# Patient Record
Sex: Male | Born: 1955 | Race: White | Hispanic: No | Marital: Married | Smoking: Never smoker
Health system: Southern US, Community
[De-identification: ages and names within clinical notes are randomized; demographics above are authoritative.]

## PROBLEM LIST (undated history)

## (undated) DIAGNOSIS — I10 Essential (primary) hypertension: Secondary | ICD-10-CM

---

## 2002-04-15 ENCOUNTER — Emergency Department (HOSPITAL_COMMUNITY): Admission: EM | Admit: 2002-04-15 | Discharge: 2002-04-15 | Payer: Self-pay | Admitting: Emergency Medicine

## 2002-04-15 ENCOUNTER — Encounter: Payer: Self-pay | Admitting: Emergency Medicine

## 2004-09-01 ENCOUNTER — Ambulatory Visit (HOSPITAL_COMMUNITY): Admission: RE | Admit: 2004-09-01 | Discharge: 2004-09-01 | Payer: Self-pay | Admitting: Orthopedic Surgery

## 2004-10-01 ENCOUNTER — Ambulatory Visit (HOSPITAL_BASED_OUTPATIENT_CLINIC_OR_DEPARTMENT_OTHER): Admission: RE | Admit: 2004-10-01 | Discharge: 2004-10-01 | Payer: Self-pay | Admitting: Orthopedic Surgery

## 2005-12-31 IMAGING — CR DG ORBITS FOR FOREIGN BODY
2 series · 2 of 2 positions shown · non-contrast
Comparison: none

CLINICAL DATA: Pre-MRI.
 ORBITS FOR FOREIGN BODY:
 Two views of the orbits reveal no evidence of a metallic foreign body.

[view not recorded (1 of 2)]
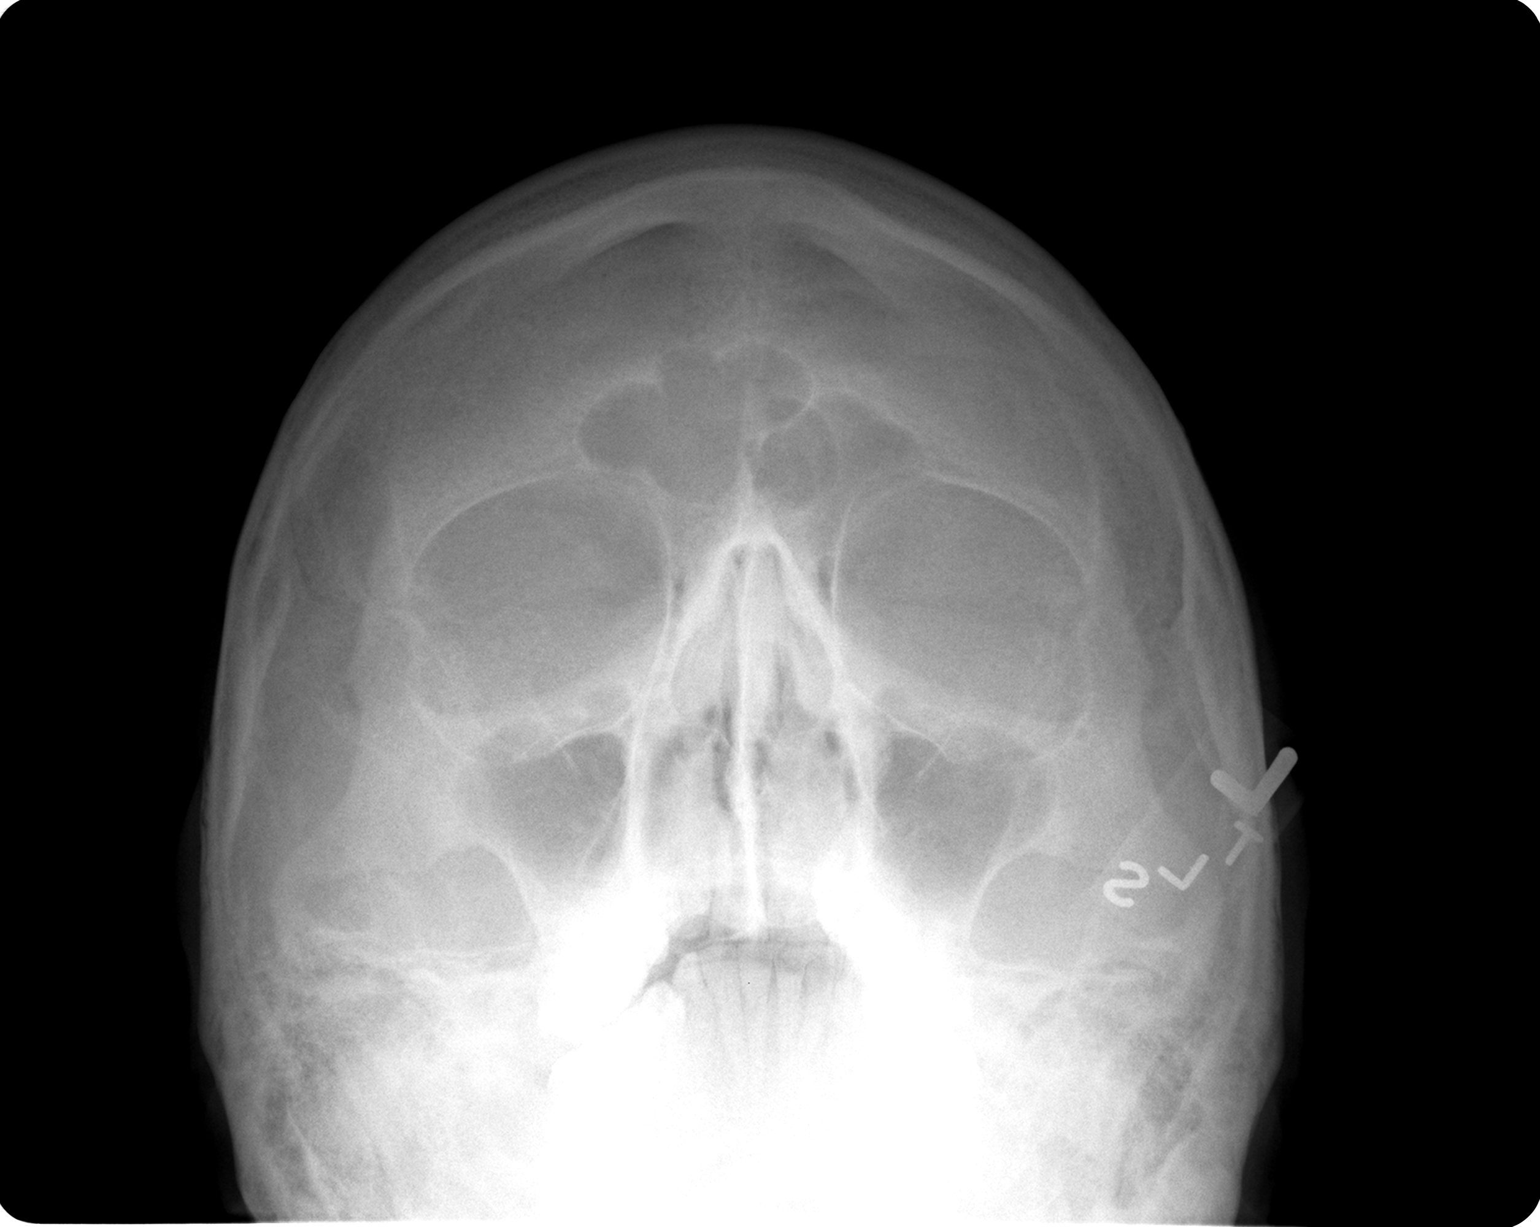

[view not recorded (2 of 2)]
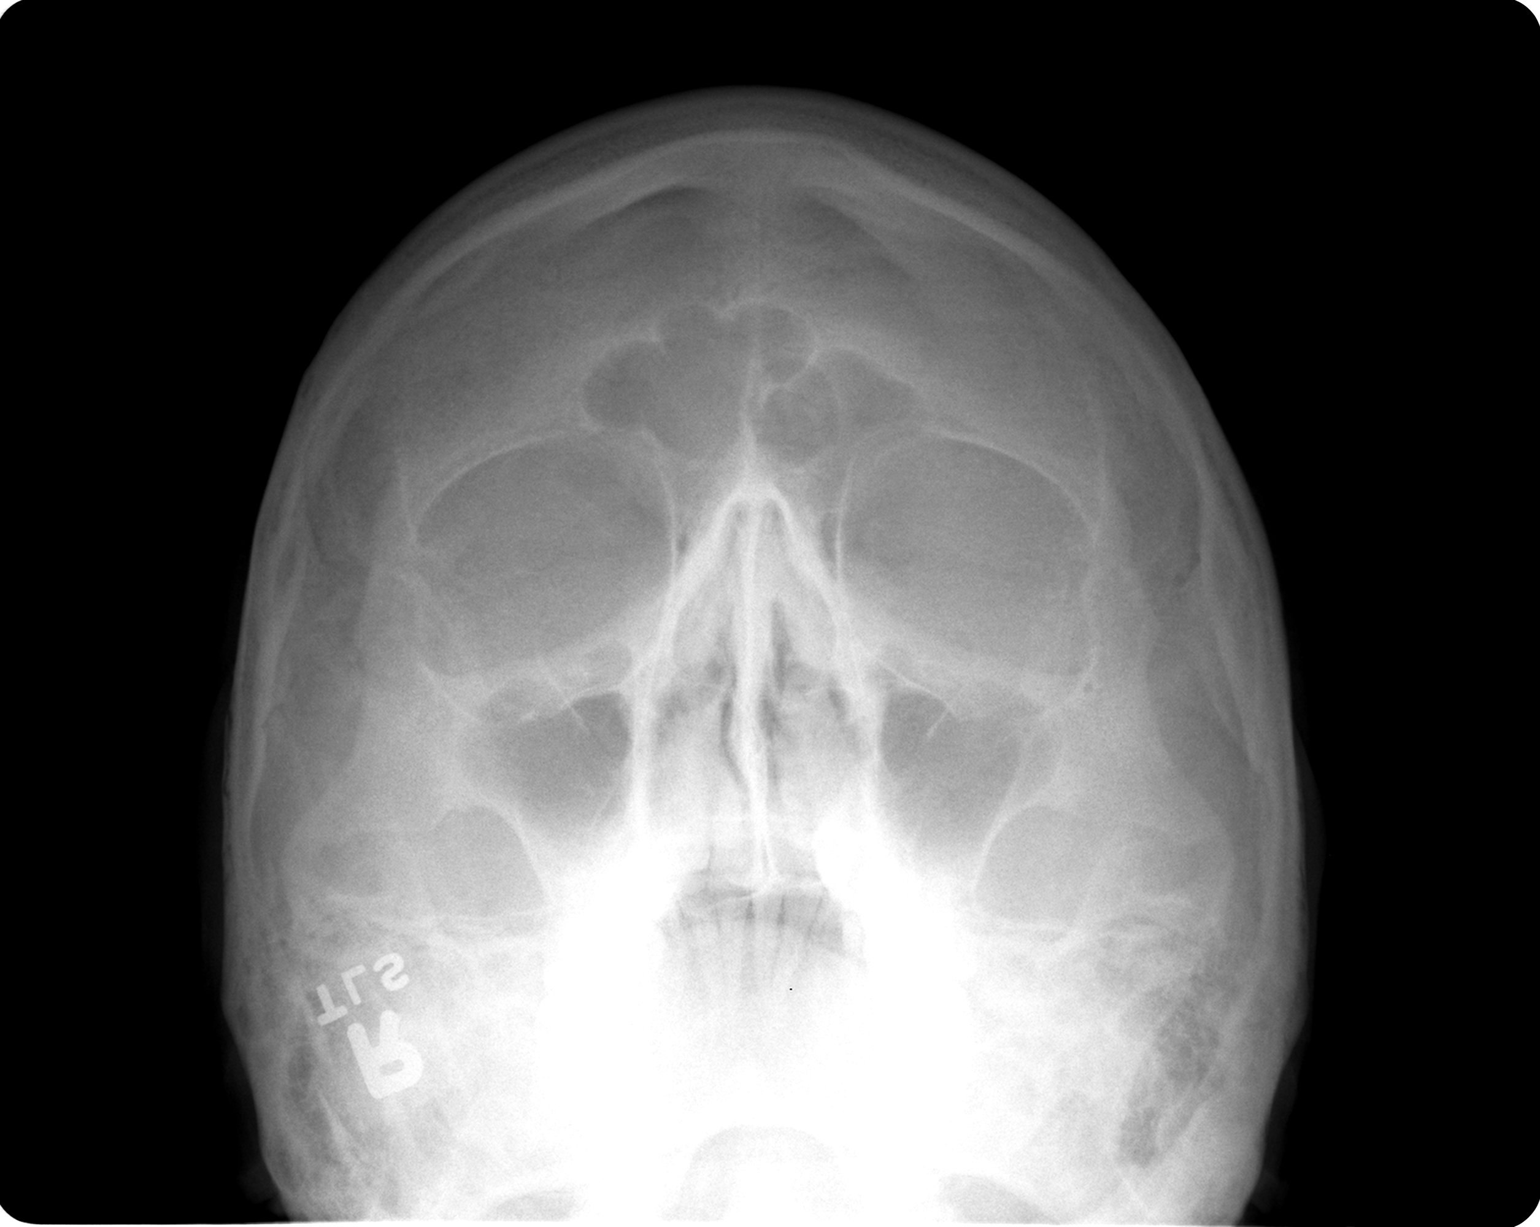

[2 of 2 positions shown; findings below may reference images not displayed]

IMPRESSION: No metallic foreign body.

## 2015-12-19 ENCOUNTER — Encounter (HOSPITAL_COMMUNITY): Payer: Self-pay | Admitting: Emergency Medicine

## 2015-12-19 ENCOUNTER — Emergency Department (HOSPITAL_COMMUNITY): Payer: BLUE CROSS/BLUE SHIELD

## 2015-12-19 ENCOUNTER — Emergency Department (HOSPITAL_COMMUNITY)
Admission: EM | Admit: 2015-12-19 | Discharge: 2015-12-19 | Disposition: A | Discharge status: Home / Self Care | Payer: BLUE CROSS/BLUE SHIELD | Attending: Emergency Medicine | Admitting: Emergency Medicine

## 2015-12-19 DIAGNOSIS — Z7982 Long term (current) use of aspirin: Secondary | ICD-10-CM | POA: Diagnosis not present

## 2015-12-19 DIAGNOSIS — I10 Essential (primary) hypertension: Secondary | ICD-10-CM | POA: Insufficient documentation

## 2015-12-19 DIAGNOSIS — K219 Gastro-esophageal reflux disease without esophagitis: Secondary | ICD-10-CM | POA: Diagnosis not present

## 2015-12-19 DIAGNOSIS — R079 Chest pain, unspecified: Secondary | ICD-10-CM | POA: Diagnosis present

## 2015-12-19 DIAGNOSIS — Z79899 Other long term (current) drug therapy: Secondary | ICD-10-CM | POA: Insufficient documentation

## 2015-12-19 HISTORY — DX: Essential (primary) hypertension: I10

## 2015-12-19 LAB — BASIC METABOLIC PANEL
Anion gap: 11 (ref 5–15)
BUN: 18 mg/dL (ref 6–20)
CO2: 21 mmol/L — ABNORMAL LOW (ref 22–32)
Calcium: 8.8 mg/dL — ABNORMAL LOW (ref 8.9–10.3)
Chloride: 104 mmol/L (ref 101–111)
Creatinine, Ser: 0.98 mg/dL (ref 0.61–1.24)
GFR calc Af Amer: >60 mL/min (ref >60)
GFR calc non Af Amer: >60 mL/min (ref >60)
Glucose, Bld: 122 mg/dL — ABNORMAL HIGH (ref 65–99)
Potassium: 2.9 mmol/L — ABNORMAL LOW (ref 3.5–5.1)
Sodium: 136 mmol/L (ref 135–145)

## 2015-12-19 LAB — HEPATIC FUNCTION PANEL
ALT: 33 U/L (ref 17–63)
AST: 38 U/L (ref 15–41)
Albumin: 4 g/dL (ref 3.5–5.0)
Alkaline Phosphatase: 62 U/L (ref 38–126)
Bilirubin, Direct: 0.1 mg/dL (ref 0.1–0.5)
Indirect Bilirubin: 0.2 mg/dL — ABNORMAL LOW (ref 0.3–0.9)
Total Bilirubin: 0.3 mg/dL (ref 0.3–1.2)
Total Protein: 7.1 g/dL (ref 6.5–8.1)

## 2015-12-19 LAB — CBC
HCT: 43.2 % (ref 39.0–52.0)
Hemoglobin: 14.7 g/dL (ref 13.0–17.0)
MCH: 27.6 pg (ref 26.0–34.0)
MCHC: 34 g/dL (ref 30.0–36.0)
MCV: 81.1 fL (ref 78.0–100.0)
Platelets: 218 10*3/uL (ref 150–400)
RBC: 5.33 MIL/uL (ref 4.22–5.81)
RDW: 13.5 % (ref 11.5–15.5)
WBC: 8.5 10*3/uL (ref 4.0–10.5)

## 2015-12-19 LAB — I-STAT TROPONIN, ED
Troponin i, poc: 0 ng/mL (ref 0.00–0.08)
Troponin i, poc: 0.01 ng/mL (ref 0.00–0.08)

## 2015-12-19 MED ORDER — SODIUM CHLORIDE 0.9 % IV BOLUS (SEPSIS)
1000.0000 mL | Freq: Once | INTRAVENOUS | Status: AC
  Administered 2015-12-19: 1000 mL via INTRAVENOUS

## 2015-12-19 MED ORDER — PANTOPRAZOLE SODIUM 40 MG IV SOLR
40.0000 mg | Freq: Once | INTRAVENOUS | Status: AC
Start: 2015-12-19 — End: 2015-12-19
  Administered 2015-12-19: 40 mg via INTRAVENOUS
  Filled 2015-12-19: qty 40

## 2015-12-19 MED ORDER — POTASSIUM CHLORIDE CRYS ER 20 MEQ PO TBCR
40.0000 meq | EXTENDED_RELEASE_TABLET | Freq: Once | ORAL | Status: AC
  Administered 2015-12-19: 40 meq via ORAL
  Filled 2015-12-19: qty 2

## 2015-12-19 MED ORDER — PANTOPRAZOLE SODIUM 20 MG PO TBEC: 20.0000 mg | DELAYED_RELEASE_TABLET | Freq: Every day | ORAL | Status: DC

## 2015-12-19 NOTE — ED Provider Notes (Signed)
CSN: [id number]     Arrival date & time 12/19/15  0706 History   First MD Initiated Contact with Patient 12/19/15 [phone]     Chief Complaint  Patient presents with  . Chest Pain  . Diarrhea     (Consider location/radiation/quality/duration/timing/severity/associated sxs/prior Treatment) Patient is a 60 y.o. male presenting with chest pain and diarrhea. The history is provided by the patient (Patient states that he has had diarrhea for a few days. No blood in the stool today started having some chest discomfort).  Chest Pain Pain location:  Epigastric Pain quality: aching   Pain radiates to:  Does not radiate Pain radiates to the back: no   Pain severity:  Moderate Timing:  Constant Chronicity:  New Context: not breathing   Associated symptoms: no abdominal pain, no back pain, no cough, no fatigue and no headache   Diarrhea Associated symptoms: no abdominal pain and no headaches     Past Medical History  Diagnosis Date  . Hypertension    History reviewed. No pertinent past surgical history. No family history on file. Social History  Substance Use Topics  . Smoking status: None  . Smokeless tobacco: None  . Alcohol Use: None    Review of Systems  Constitutional: Negative for appetite change and fatigue.  HENT: Negative for congestion, ear discharge and sinus pressure.   Eyes: Negative for discharge.  Respiratory: Negative for cough.   Cardiovascular: Positive for chest pain.  Gastrointestinal: Positive for diarrhea. Negative for abdominal pain.  Genitourinary: Negative for frequency and hematuria.  Musculoskeletal: Negative for back pain.  Skin: Negative for rash.  Neurological: Negative for seizures and headaches.  Psychiatric/Behavioral: Negative for hallucinations.      Allergies  Bee venom  Home Medications   Prior to Admission medications   Medication Sig Start Date End Date Taking? Authorizing Provider  acetaminophen (TYLENOL) 500 MG tablet Take 1,000 mg  by mouth every 6 (six) hours as needed for mild pain, moderate pain, fever or headache.   Yes Historical Provider, MD  aspirin EC 81 MG tablet Take 81 mg by mouth daily.   Yes Historical Provider, MD  bismuth subsalicylate (PEPTO BISMOL) 262 MG/15ML suspension Take 30 mLs by mouth every 6 (six) hours as needed for indigestion or diarrhea or loose stools.   Yes Historical Provider, MD  chlorthalidone (HYGROTON) 25 MG tablet Take 25 mg by mouth daily.   Yes Historical Provider, MD  glucosamine-chondroitin 500-400 MG tablet Take 1 tablet by mouth daily.   Yes Historical Provider, MD  losartan (COZAAR) 50 MG tablet Take 50 mg by mouth daily.   Yes Historical Provider, MD  metoprolol succinate (TOPROL-XL) 100 MG 24 hr tablet Take 100 mg by mouth daily. Take with or immediately following a meal.   Yes Historical Provider, MD  potassium chloride (K-DUR,KLOR-CON) 10 MEQ tablet Take 10 mEq by mouth daily.   Yes Historical Provider, MD   BP 125/90 mmHg  Pulse 88  Resp 16  SpO2 95% Physical Exam  Constitutional: He is oriented to person, place, and time. He appears well-developed.  HENT:  Head: Normocephalic.  Eyes: Conjunctivae and EOM are normal. No scleral icterus.  Neck: Neck supple. No thyromegaly present.  Cardiovascular: Normal rate and regular rhythm.  Exam reveals no gallop and no friction rub.   No murmur heard. Pulmonary/Chest: No stridor. He has no wheezes. He has no rales. He exhibits no tenderness.  Abdominal: He exhibits no distension. There is no tenderness. There is no rebound.  Musculoskeletal: Normal range of motion. He exhibits no edema.  Lymphadenopathy:    He has no cervical adenopathy.  Neurological: He is oriented to person, place, and time. He exhibits normal muscle tone. Coordination normal.  Skin: No rash noted. No erythema.  Psychiatric: He has a normal mood and affect. His behavior is normal.    ED Course  Procedures (including critical care time) Labs Review Labs  Reviewed  BASIC METABOLIC PANEL - Abnormal; Notable for the following:    Potassium 2.9 (*)    CO2 21 (*)    Glucose, Bld 122 (*)    Calcium 8.8 (*)    All other components within normal limits  HEPATIC FUNCTION PANEL - Abnormal; Notable for the following:    Indirect Bilirubin 0.2 (*)    All other components within normal limits  CBC  I-STAT TROPOININ, ED    Imaging Review Dg Chest 2 View  12/19/2015  CLINICAL DATA:  Muscular-type chest pain for the last 2-3 days, with new onset squeezing type chest pain and shortness of breath this morning. EXAM: CHEST  2 VIEW COMPARISON:  None. FINDINGS: Cardiomediastinal silhouette is normal in size and configuration. Lungs are clear. Lung volumes are normal. No evidence of pneumonia. No pleural effusion. No pneumothorax. Degenerative spurring is seen throughout the thoracic spine, mild to moderate in degree. No acute osseous abnormality. Soft tissues about the chest are unremarkable. IMPRESSION: Lungs are clear and there is no evidence of acute cardiopulmonary abnormality. Heart size is normal. Electronically Signed   By: Bary Richard M.D.   On: 12/19/2015 07:45   I have personally reviewed and evaluated these images and lab results as part of my medical decision-making.   EKG Interpretation   Date/Time:  Thursday December 19 2015 07:18:13 EST Ventricular Rate:  85 PR Interval:  160 QRS Duration: 110 QT Interval:  376 QTC Calculation: 447 R Axis:     Text Interpretation:  Sinus rhythm Low voltage, precordial leads RSR' in  V1 or V2, right VCD or RVH Borderline T abnormalities, diffuse leads  Baseline wander in lead(s) V3 Confirmed by Jolaine Fryberger  MD, Shyrl Obi [phone]) on  12/19/2015 2:12:38 PM      MDM   Final diagnoses:  None    Patient presented with diarrhea and some epigastric discomfort patient has had 2 troponins are negative and EKG is unremarkable.this cardiology is a patient improved with protonic IV will give patient a protonic  prescription and have him follow-up with his PCP this week    Bethann Berkshire, MD 12/19/15 1413

## 2015-12-19 NOTE — Discharge Instructions (Signed)
Follow up with your md next week. °

## 2015-12-19 NOTE — ED Notes (Signed)
MD at bedside. 

## 2015-12-19 NOTE — ED Notes (Signed)
Patient transported to X-ray 

## 2015-12-19 NOTE — ED Notes (Signed)
Pt c/o diarrhea onset Monday, nausea, emesis, indigestion with copious amounts of burping x several days with muscular type chest pain, SOB, squeezing chest pain onset this morning. Denies any aggravating/alleviating factors. No blood in stool, no history of arrhythmia.

## 2016-08-03 DIAGNOSIS — R42 Dizziness and giddiness: Secondary | ICD-10-CM | POA: Diagnosis not present

## 2016-08-03 DIAGNOSIS — I10 Essential (primary) hypertension: Secondary | ICD-10-CM | POA: Diagnosis not present

## 2016-08-03 DIAGNOSIS — Z1389 Encounter for screening for other disorder: Secondary | ICD-10-CM | POA: Diagnosis not present

## 2016-08-03 DIAGNOSIS — Z23 Encounter for immunization: Secondary | ICD-10-CM | POA: Diagnosis not present

## 2016-08-03 DIAGNOSIS — Z0000 Encounter for general adult medical examination without abnormal findings: Secondary | ICD-10-CM | POA: Diagnosis not present

## 2016-10-09 DIAGNOSIS — J101 Influenza due to other identified influenza virus with other respiratory manifestations: Secondary | ICD-10-CM | POA: Diagnosis not present

## 2016-10-09 DIAGNOSIS — Z6838 Body mass index (BMI) 38.0-38.9, adult: Secondary | ICD-10-CM | POA: Diagnosis not present

## 2016-10-09 DIAGNOSIS — E669 Obesity, unspecified: Secondary | ICD-10-CM | POA: Diagnosis not present

## 2017-02-01 DIAGNOSIS — I10 Essential (primary) hypertension: Secondary | ICD-10-CM | POA: Diagnosis not present

## 2017-02-01 DIAGNOSIS — Z6837 Body mass index (BMI) 37.0-37.9, adult: Secondary | ICD-10-CM | POA: Diagnosis not present

## 2017-08-09 DIAGNOSIS — Z0000 Encounter for general adult medical examination without abnormal findings: Secondary | ICD-10-CM | POA: Diagnosis not present

## 2017-08-09 DIAGNOSIS — I10 Essential (primary) hypertension: Secondary | ICD-10-CM | POA: Diagnosis not present

## 2017-08-09 DIAGNOSIS — Z6837 Body mass index (BMI) 37.0-37.9, adult: Secondary | ICD-10-CM | POA: Diagnosis not present

## 2018-02-09 DIAGNOSIS — G4719 Other hypersomnia: Secondary | ICD-10-CM | POA: Diagnosis not present

## 2018-02-09 DIAGNOSIS — Z6838 Body mass index (BMI) 38.0-38.9, adult: Secondary | ICD-10-CM | POA: Diagnosis not present

## 2018-02-09 DIAGNOSIS — Z1331 Encounter for screening for depression: Secondary | ICD-10-CM | POA: Diagnosis not present

## 2018-02-09 DIAGNOSIS — I10 Essential (primary) hypertension: Secondary | ICD-10-CM | POA: Diagnosis not present

## 2018-02-23 DIAGNOSIS — D649 Anemia, unspecified: Secondary | ICD-10-CM | POA: Diagnosis not present

## 2018-03-15 DIAGNOSIS — Z1211 Encounter for screening for malignant neoplasm of colon: Secondary | ICD-10-CM | POA: Diagnosis not present

## 2018-04-04 DIAGNOSIS — D509 Iron deficiency anemia, unspecified: Secondary | ICD-10-CM | POA: Diagnosis not present

## 2018-04-04 DIAGNOSIS — Z6837 Body mass index (BMI) 37.0-37.9, adult: Secondary | ICD-10-CM | POA: Diagnosis not present

## 2018-06-08 DIAGNOSIS — D509 Iron deficiency anemia, unspecified: Secondary | ICD-10-CM | POA: Diagnosis not present

## 2018-09-20 DIAGNOSIS — D509 Iron deficiency anemia, unspecified: Secondary | ICD-10-CM | POA: Diagnosis not present

## 2018-09-20 DIAGNOSIS — Z6836 Body mass index (BMI) 36.0-36.9, adult: Secondary | ICD-10-CM | POA: Diagnosis not present

## 2018-09-20 DIAGNOSIS — I10 Essential (primary) hypertension: Secondary | ICD-10-CM | POA: Diagnosis not present

## 2018-09-20 DIAGNOSIS — Z0000 Encounter for general adult medical examination without abnormal findings: Secondary | ICD-10-CM | POA: Diagnosis not present

## 2019-04-05 DIAGNOSIS — I10 Essential (primary) hypertension: Secondary | ICD-10-CM | POA: Diagnosis not present

## 2019-04-05 DIAGNOSIS — Z1331 Encounter for screening for depression: Secondary | ICD-10-CM | POA: Diagnosis not present

## 2019-10-06 DIAGNOSIS — I10 Essential (primary) hypertension: Secondary | ICD-10-CM | POA: Diagnosis not present

## 2019-10-06 DIAGNOSIS — Z0000 Encounter for general adult medical examination without abnormal findings: Secondary | ICD-10-CM | POA: Diagnosis not present

## 2019-10-06 DIAGNOSIS — L821 Other seborrheic keratosis: Secondary | ICD-10-CM | POA: Diagnosis not present

## 2019-10-06 DIAGNOSIS — Z6839 Body mass index (BMI) 39.0-39.9, adult: Secondary | ICD-10-CM | POA: Diagnosis not present

## 2019-10-06 DIAGNOSIS — Z13220 Encounter for screening for lipoid disorders: Secondary | ICD-10-CM | POA: Diagnosis not present

## 2020-04-08 DIAGNOSIS — I10 Essential (primary) hypertension: Secondary | ICD-10-CM | POA: Diagnosis not present

## 2020-04-08 DIAGNOSIS — Z6839 Body mass index (BMI) 39.0-39.9, adult: Secondary | ICD-10-CM | POA: Diagnosis not present

## 2020-04-08 DIAGNOSIS — Z1331 Encounter for screening for depression: Secondary | ICD-10-CM | POA: Diagnosis not present

## 2024-02-01 ENCOUNTER — Encounter (HOSPITAL_COMMUNITY): Admission: EM | Disposition: E | Payer: Self-pay | Source: Home / Self Care | Attending: Surgery

## 2024-02-01 ENCOUNTER — Inpatient Hospital Stay (HOSPITAL_COMMUNITY): Admitting: Anesthesiology

## 2024-02-01 ENCOUNTER — Inpatient Hospital Stay (HOSPITAL_COMMUNITY)

## 2024-02-01 ENCOUNTER — Encounter (HOSPITAL_COMMUNITY): Payer: Self-pay | Admitting: Cardiology

## 2024-02-01 ENCOUNTER — Other Ambulatory Visit: Payer: Self-pay

## 2024-02-01 ENCOUNTER — Telehealth (HOSPITAL_COMMUNITY): Payer: Self-pay | Admitting: Pharmacy Technician

## 2024-02-01 ENCOUNTER — Other Ambulatory Visit (HOSPITAL_COMMUNITY): Payer: Self-pay

## 2024-02-01 ENCOUNTER — Inpatient Hospital Stay (HOSPITAL_COMMUNITY)
Admission: EM | Admit: 2024-02-01 | Discharge: 2024-03-19 | DRG: 003 | Disposition: E | Attending: Critical Care Medicine | Admitting: Critical Care Medicine

## 2024-02-01 DIAGNOSIS — I6340 Cerebral infarction due to embolism of unspecified cerebral artery: Secondary | ICD-10-CM | POA: Diagnosis not present

## 2024-02-01 DIAGNOSIS — Z515 Encounter for palliative care: Secondary | ICD-10-CM

## 2024-02-01 DIAGNOSIS — R739 Hyperglycemia, unspecified: Secondary | ICD-10-CM | POA: Diagnosis not present

## 2024-02-01 DIAGNOSIS — G4700 Insomnia, unspecified: Secondary | ICD-10-CM | POA: Diagnosis not present

## 2024-02-01 DIAGNOSIS — I2119 ST elevation (STEMI) myocardial infarction involving other coronary artery of inferior wall: Secondary | ICD-10-CM | POA: Diagnosis not present

## 2024-02-01 DIAGNOSIS — I110 Hypertensive heart disease with heart failure: Secondary | ICD-10-CM | POA: Diagnosis not present

## 2024-02-01 DIAGNOSIS — D6959 Other secondary thrombocytopenia: Secondary | ICD-10-CM | POA: Diagnosis not present

## 2024-02-01 DIAGNOSIS — G9349 Other encephalopathy: Secondary | ICD-10-CM | POA: Diagnosis not present

## 2024-02-01 DIAGNOSIS — R569 Unspecified convulsions: Secondary | ICD-10-CM | POA: Diagnosis not present

## 2024-02-01 DIAGNOSIS — I493 Ventricular premature depolarization: Secondary | ICD-10-CM | POA: Diagnosis not present

## 2024-02-01 DIAGNOSIS — E876 Hypokalemia: Secondary | ICD-10-CM | POA: Diagnosis not present

## 2024-02-01 DIAGNOSIS — R4182 Altered mental status, unspecified: Secondary | ICD-10-CM | POA: Diagnosis not present

## 2024-02-01 DIAGNOSIS — J9809 Other diseases of bronchus, not elsewhere classified: Secondary | ICD-10-CM | POA: Diagnosis not present

## 2024-02-01 DIAGNOSIS — E872 Acidosis, unspecified: Secondary | ICD-10-CM | POA: Diagnosis not present

## 2024-02-01 DIAGNOSIS — Y95 Nosocomial condition: Secondary | ICD-10-CM | POA: Diagnosis not present

## 2024-02-01 DIAGNOSIS — I25110 Atherosclerotic heart disease of native coronary artery with unstable angina pectoris: Secondary | ICD-10-CM | POA: Diagnosis not present

## 2024-02-01 DIAGNOSIS — I2111 ST elevation (STEMI) myocardial infarction involving right coronary artery: Principal | ICD-10-CM | POA: Diagnosis present

## 2024-02-01 DIAGNOSIS — G9340 Encephalopathy, unspecified: Secondary | ICD-10-CM

## 2024-02-01 DIAGNOSIS — I10 Essential (primary) hypertension: Secondary | ICD-10-CM

## 2024-02-01 DIAGNOSIS — B371 Pulmonary candidiasis: Secondary | ICD-10-CM | POA: Diagnosis not present

## 2024-02-01 DIAGNOSIS — Z87891 Personal history of nicotine dependence: Secondary | ICD-10-CM

## 2024-02-01 DIAGNOSIS — N170 Acute kidney failure with tubular necrosis: Secondary | ICD-10-CM | POA: Diagnosis not present

## 2024-02-01 DIAGNOSIS — I4891 Unspecified atrial fibrillation: Secondary | ICD-10-CM | POA: Diagnosis not present

## 2024-02-01 DIAGNOSIS — K567 Ileus, unspecified: Secondary | ICD-10-CM | POA: Diagnosis not present

## 2024-02-01 DIAGNOSIS — I7121 Aneurysm of the ascending aorta, without rupture: Secondary | ICD-10-CM

## 2024-02-01 DIAGNOSIS — I351 Nonrheumatic aortic (valve) insufficiency: Secondary | ICD-10-CM | POA: Diagnosis present

## 2024-02-01 DIAGNOSIS — I7102 Dissection of abdominal aorta: Secondary | ICD-10-CM | POA: Diagnosis not present

## 2024-02-01 DIAGNOSIS — Z79899 Other long term (current) drug therapy: Secondary | ICD-10-CM

## 2024-02-01 DIAGNOSIS — I7101 Dissection of ascending aorta: Secondary | ICD-10-CM | POA: Diagnosis not present

## 2024-02-01 DIAGNOSIS — J189 Pneumonia, unspecified organism: Secondary | ICD-10-CM | POA: Diagnosis not present

## 2024-02-01 DIAGNOSIS — E785 Hyperlipidemia, unspecified: Secondary | ICD-10-CM | POA: Diagnosis present

## 2024-02-01 DIAGNOSIS — Z6837 Body mass index (BMI) 37.0-37.9, adult: Secondary | ICD-10-CM

## 2024-02-01 DIAGNOSIS — A419 Sepsis, unspecified organism: Secondary | ICD-10-CM | POA: Diagnosis not present

## 2024-02-01 DIAGNOSIS — I82612 Acute embolism and thrombosis of superficial veins of left upper extremity: Secondary | ICD-10-CM | POA: Diagnosis not present

## 2024-02-01 DIAGNOSIS — E870 Hyperosmolality and hypernatremia: Secondary | ICD-10-CM | POA: Diagnosis not present

## 2024-02-01 DIAGNOSIS — I252 Old myocardial infarction: Secondary | ICD-10-CM

## 2024-02-01 DIAGNOSIS — N990 Postprocedural (acute) (chronic) kidney failure: Secondary | ICD-10-CM | POA: Diagnosis not present

## 2024-02-01 DIAGNOSIS — E8809 Other disorders of plasma-protein metabolism, not elsewhere classified: Secondary | ICD-10-CM | POA: Diagnosis not present

## 2024-02-01 DIAGNOSIS — G931 Anoxic brain damage, not elsewhere classified: Secondary | ICD-10-CM | POA: Diagnosis not present

## 2024-02-01 DIAGNOSIS — Z930 Tracheostomy status: Secondary | ICD-10-CM

## 2024-02-01 DIAGNOSIS — L89893 Pressure ulcer of other site, stage 3: Secondary | ICD-10-CM | POA: Diagnosis not present

## 2024-02-01 DIAGNOSIS — I480 Paroxysmal atrial fibrillation: Secondary | ICD-10-CM | POA: Diagnosis not present

## 2024-02-01 DIAGNOSIS — R570 Cardiogenic shock: Secondary | ICD-10-CM | POA: Diagnosis not present

## 2024-02-01 DIAGNOSIS — Z9911 Dependence on respirator [ventilator] status: Secondary | ICD-10-CM

## 2024-02-01 DIAGNOSIS — K5900 Constipation, unspecified: Secondary | ICD-10-CM | POA: Diagnosis not present

## 2024-02-01 DIAGNOSIS — E878 Other disorders of electrolyte and fluid balance, not elsewhere classified: Secondary | ICD-10-CM | POA: Diagnosis not present

## 2024-02-01 DIAGNOSIS — E871 Hypo-osmolality and hyponatremia: Secondary | ICD-10-CM | POA: Diagnosis not present

## 2024-02-01 DIAGNOSIS — N179 Acute kidney failure, unspecified: Secondary | ICD-10-CM | POA: Diagnosis not present

## 2024-02-01 DIAGNOSIS — Z9889 Other specified postprocedural states: Secondary | ICD-10-CM | POA: Diagnosis not present

## 2024-02-01 DIAGNOSIS — I639 Cerebral infarction, unspecified: Secondary | ICD-10-CM | POA: Diagnosis not present

## 2024-02-01 DIAGNOSIS — D62 Acute posthemorrhagic anemia: Secondary | ICD-10-CM | POA: Diagnosis not present

## 2024-02-01 DIAGNOSIS — K7200 Acute and subacute hepatic failure without coma: Secondary | ICD-10-CM | POA: Diagnosis not present

## 2024-02-01 DIAGNOSIS — D6832 Hemorrhagic disorder due to extrinsic circulating anticoagulants: Secondary | ICD-10-CM | POA: Diagnosis not present

## 2024-02-01 DIAGNOSIS — I5020 Unspecified systolic (congestive) heart failure: Secondary | ICD-10-CM | POA: Diagnosis not present

## 2024-02-01 DIAGNOSIS — I82622 Acute embolism and thrombosis of deep veins of left upper extremity: Secondary | ICD-10-CM | POA: Diagnosis not present

## 2024-02-01 DIAGNOSIS — Z7982 Long term (current) use of aspirin: Secondary | ICD-10-CM

## 2024-02-01 DIAGNOSIS — J9602 Acute respiratory failure with hypercapnia: Secondary | ICD-10-CM | POA: Diagnosis not present

## 2024-02-01 DIAGNOSIS — Z66 Do not resuscitate: Secondary | ICD-10-CM | POA: Diagnosis not present

## 2024-02-01 DIAGNOSIS — I5021 Acute systolic (congestive) heart failure: Secondary | ICD-10-CM | POA: Diagnosis not present

## 2024-02-01 DIAGNOSIS — E669 Obesity, unspecified: Secondary | ICD-10-CM | POA: Diagnosis present

## 2024-02-01 DIAGNOSIS — I2510 Atherosclerotic heart disease of native coronary artery without angina pectoris: Secondary | ICD-10-CM | POA: Diagnosis not present

## 2024-02-01 DIAGNOSIS — J810 Acute pulmonary edema: Secondary | ICD-10-CM

## 2024-02-01 DIAGNOSIS — T502X5A Adverse effect of carbonic-anhydrase inhibitors, benzothiadiazides and other diuretics, initial encounter: Secondary | ICD-10-CM | POA: Diagnosis not present

## 2024-02-01 DIAGNOSIS — Z95828 Presence of other vascular implants and grafts: Secondary | ICD-10-CM | POA: Diagnosis not present

## 2024-02-01 DIAGNOSIS — R609 Edema, unspecified: Secondary | ICD-10-CM | POA: Diagnosis not present

## 2024-02-01 DIAGNOSIS — J9601 Acute respiratory failure with hypoxia: Secondary | ICD-10-CM | POA: Diagnosis not present

## 2024-02-01 DIAGNOSIS — R6521 Severe sepsis with septic shock: Secondary | ICD-10-CM | POA: Diagnosis not present

## 2024-02-01 DIAGNOSIS — R079 Chest pain, unspecified: Secondary | ICD-10-CM

## 2024-02-01 DIAGNOSIS — D72829 Elevated white blood cell count, unspecified: Secondary | ICD-10-CM | POA: Diagnosis not present

## 2024-02-01 DIAGNOSIS — Z7189 Other specified counseling: Secondary | ICD-10-CM | POA: Diagnosis not present

## 2024-02-01 DIAGNOSIS — T8119XA Other postprocedural shock, initial encounter: Secondary | ICD-10-CM | POA: Diagnosis not present

## 2024-02-01 DIAGNOSIS — Z781 Physical restraint status: Secondary | ICD-10-CM

## 2024-02-01 DIAGNOSIS — L89151 Pressure ulcer of sacral region, stage 1: Secondary | ICD-10-CM | POA: Diagnosis not present

## 2024-02-01 DIAGNOSIS — Z7902 Long term (current) use of antithrombotics/antiplatelets: Secondary | ICD-10-CM

## 2024-02-01 DIAGNOSIS — R4701 Aphasia: Secondary | ICD-10-CM | POA: Diagnosis not present

## 2024-02-01 DIAGNOSIS — R29719 NIHSS score 19: Secondary | ICD-10-CM | POA: Diagnosis not present

## 2024-02-01 DIAGNOSIS — I82462 Acute embolism and thrombosis of left calf muscular vein: Secondary | ICD-10-CM | POA: Diagnosis not present

## 2024-02-01 DIAGNOSIS — K9189 Other postprocedural complications and disorders of digestive system: Secondary | ICD-10-CM | POA: Diagnosis not present

## 2024-02-01 DIAGNOSIS — T45525A Adverse effect of antithrombotic drugs, initial encounter: Secondary | ICD-10-CM | POA: Diagnosis not present

## 2024-02-01 DIAGNOSIS — D75839 Thrombocytosis, unspecified: Secondary | ICD-10-CM | POA: Diagnosis not present

## 2024-02-01 DIAGNOSIS — F05 Delirium due to known physiological condition: Secondary | ICD-10-CM | POA: Diagnosis not present

## 2024-02-01 HISTORY — PX: THORACIC AORTIC ANEURYSM REPAIR: SHX799

## 2024-02-01 HISTORY — PX: INTRAOPERATIVE TRANSESOPHAGEAL ECHOCARDIOGRAM: SHX5062

## 2024-02-01 HISTORY — PX: CORONARY/GRAFT ACUTE MI REVASCULARIZATION: CATH118305

## 2024-02-01 HISTORY — PX: BENTALL PROCEDURE: SHX5058

## 2024-02-01 LAB — PREPARE FRESH FROZEN PLASMA: Unit division: 0

## 2024-02-01 LAB — POCT I-STAT, CHEM 8
BUN: 18 mg/dL (ref 8–23)
BUN: 25 mg/dL — ABNORMAL HIGH (ref 8–23)
BUN: 22 mg/dL (ref 8–23)
BUN: 23 mg/dL (ref 8–23)
BUN: 24 mg/dL — ABNORMAL HIGH (ref 8–23)
BUN: 23 mg/dL (ref 8–23)
BUN: 23 mg/dL (ref 8–23)
Calcium, Ion: 1.16 mmol/L (ref 1.15–1.40)
Calcium, Ion: 1.18 mmol/L (ref 1.15–1.40)
Calcium, Ion: 1.13 mmol/L — ABNORMAL LOW (ref 1.15–1.40)
Calcium, Ion: 0.99 mmol/L — ABNORMAL LOW (ref 1.15–1.40)
Calcium, Ion: 0.97 mmol/L — ABNORMAL LOW (ref 1.15–1.40)
Calcium, Ion: 0.86 mmol/L — CL (ref 1.15–1.40)
Calcium, Ion: 0.89 mmol/L — CL (ref 1.15–1.40)
Chloride: 108 mmol/L (ref 98–111)
Chloride: 106 mmol/L (ref 98–111)
Chloride: 106 mmol/L (ref 98–111)
Chloride: 103 mmol/L (ref 98–111)
Chloride: 100 mmol/L (ref 98–111)
Chloride: 101 mmol/L (ref 98–111)
Chloride: 102 mmol/L (ref 98–111)
Creatinine, Ser: 1.3 mg/dL — ABNORMAL HIGH (ref 0.61–1.24)
Creatinine, Ser: 1.6 mg/dL — ABNORMAL HIGH (ref 0.61–1.24)
Creatinine, Ser: 1.7 mg/dL — ABNORMAL HIGH (ref 0.61–1.24)
Creatinine, Ser: 1.5 mg/dL — ABNORMAL HIGH (ref 0.61–1.24)
Creatinine, Ser: 1.6 mg/dL — ABNORMAL HIGH (ref 0.61–1.24)
Creatinine, Ser: 1.5 mg/dL — ABNORMAL HIGH (ref 0.61–1.24)
Creatinine, Ser: 1.6 mg/dL — ABNORMAL HIGH (ref 0.61–1.24)
Glucose, Bld: 165 mg/dL — ABNORMAL HIGH (ref 70–99)
Glucose, Bld: 128 mg/dL — ABNORMAL HIGH (ref 70–99)
Glucose, Bld: 127 mg/dL — ABNORMAL HIGH (ref 70–99)
Glucose, Bld: 126 mg/dL — ABNORMAL HIGH (ref 70–99)
Glucose, Bld: 195 mg/dL — ABNORMAL HIGH (ref 70–99)
Glucose, Bld: 179 mg/dL — ABNORMAL HIGH (ref 70–99)
Glucose, Bld: 202 mg/dL — ABNORMAL HIGH (ref 70–99)
HCT: 44 % (ref 39.0–52.0)
HCT: 45 % (ref 39.0–52.0)
HCT: 38 % — ABNORMAL LOW (ref 39.0–52.0)
HCT: 30 % — ABNORMAL LOW (ref 39.0–52.0)
HCT: 27 % — ABNORMAL LOW (ref 39.0–52.0)
HCT: 23 % — ABNORMAL LOW (ref 39.0–52.0)
HCT: 26 % — ABNORMAL LOW (ref 39.0–52.0)
Hemoglobin: 15 g/dL (ref 13.0–17.0)
Hemoglobin: 15.3 g/dL (ref 13.0–17.0)
Hemoglobin: 12.9 g/dL — ABNORMAL LOW (ref 13.0–17.0)
Hemoglobin: 10.2 g/dL — ABNORMAL LOW (ref 13.0–17.0)
Hemoglobin: 9.2 g/dL — ABNORMAL LOW (ref 13.0–17.0)
Hemoglobin: 7.8 g/dL — ABNORMAL LOW (ref 13.0–17.0)
Hemoglobin: 8.8 g/dL — ABNORMAL LOW (ref 13.0–17.0)
Potassium: 3.3 mmol/L — ABNORMAL LOW (ref 3.5–5.1)
Potassium: 5 mmol/L (ref 3.5–5.1)
Potassium: 5.3 mmol/L — ABNORMAL HIGH (ref 3.5–5.1)
Potassium: 5 mmol/L (ref 3.5–5.1)
Potassium: 4 mmol/L (ref 3.5–5.1)
Potassium: 4.1 mmol/L (ref 3.5–5.1)
Potassium: 4.5 mmol/L (ref 3.5–5.1)
Sodium: 143 mmol/L (ref 135–145)
Sodium: 140 mmol/L (ref 135–145)
Sodium: 138 mmol/L (ref 135–145)
Sodium: 139 mmol/L (ref 135–145)
Sodium: 139 mmol/L (ref 135–145)
Sodium: 142 mmol/L (ref 135–145)
Sodium: 143 mmol/L (ref 135–145)
TCO2: 21 mmol/L — ABNORMAL LOW (ref 22–32)
TCO2: 22 mmol/L (ref 22–32)
TCO2: 21 mmol/L — ABNORMAL LOW (ref 22–32)
TCO2: 26 mmol/L (ref 22–32)
TCO2: 23 mmol/L (ref 22–32)
TCO2: 24 mmol/L (ref 22–32)
TCO2: 25 mmol/L (ref 22–32)

## 2024-02-01 LAB — CBC WITH DIFFERENTIAL/PLATELET
Abs Immature Granulocytes: 0.03 10*3/uL (ref 0.00–0.07)
Basophils Absolute: 0.1 10*3/uL (ref 0.0–0.1)
Basophils Relative: 1 %
Eosinophils Absolute: 0.1 10*3/uL (ref 0.0–0.5)
Eosinophils Relative: 2 %
HCT: 45 % (ref 39.0–52.0)
Hemoglobin: 15.2 g/dL (ref 13.0–17.0)
Immature Granulocytes: 0 %
Lymphocytes Relative: 33 %
Lymphs Abs: 2.4 10*3/uL (ref 0.7–4.0)
MCH: 30 pg (ref 26.0–34.0)
MCHC: 33.8 g/dL (ref 30.0–36.0)
MCV: 88.8 fL (ref 80.0–100.0)
Monocytes Absolute: 0.5 10*3/uL (ref 0.1–1.0)
Monocytes Relative: 6 %
Neutro Abs: 4.2 10*3/uL (ref 1.7–7.7)
Neutrophils Relative %: 58 %
Platelets: 142 10*3/uL — ABNORMAL LOW (ref 150–400)
RBC: 5.07 MIL/uL (ref 4.22–5.81)
RDW: 14.1 % (ref 11.5–15.5)
WBC: 7.2 10*3/uL (ref 4.0–10.5)
nRBC: 0 % (ref 0.0–0.2)

## 2024-02-01 LAB — BPAM FFP
Blood Product Expiration Date: 202504192359
Blood Product Expiration Date: 202504192359
Blood Product Expiration Date: 202504202359
Blood Product Expiration Date: 202504202359
ISSUE DATE / TIME: 202504152027
ISSUE DATE / TIME: 202504152027
ISSUE DATE / TIME: 202504152058
ISSUE DATE / TIME: 202504152058
Unit Type and Rh: 600
Unit Type and Rh: 6200
Unit Type and Rh: 6200
Unit Type and Rh: 6200

## 2024-02-01 LAB — ECHOCARDIOGRAM COMPLETE
Area-P 1/2: 3.26 cm2
Calc EF: 57.5 %
Height: 73 in
S' Lateral: 4 cm
Single Plane A2C EF: 61.4 %
Single Plane A4C EF: 55.1 %
Weight: 4240 [oz_av]

## 2024-02-01 LAB — POCT I-STAT 7, (LYTES, BLD GAS, ICA,H+H)
Acid-Base Excess: 0 mmol/L (ref 0.0–2.0)
Acid-base deficit: 14 mmol/L — ABNORMAL HIGH (ref 0.0–2.0)
Acid-base deficit: 2 mmol/L (ref 0.0–2.0)
Acid-base deficit: 2 mmol/L (ref 0.0–2.0)
Acid-base deficit: 6 mmol/L — ABNORMAL HIGH (ref 0.0–2.0)
Bicarbonate: 15.2 mmol/L — ABNORMAL LOW (ref 20.0–28.0)
Bicarbonate: 24.1 mmol/L (ref 20.0–28.0)
Bicarbonate: 22.4 mmol/L (ref 20.0–28.0)
Bicarbonate: 21.4 mmol/L (ref 20.0–28.0)
Bicarbonate: 24.6 mmol/L (ref 20.0–28.0)
Calcium, Ion: 1 mmol/L — ABNORMAL LOW (ref 1.15–1.40)
Calcium, Ion: 1.19 mmol/L (ref 1.15–1.40)
Calcium, Ion: 1.01 mmol/L — ABNORMAL LOW (ref 1.15–1.40)
Calcium, Ion: 0.88 mmol/L — CL (ref 1.15–1.40)
Calcium, Ion: 0.95 mmol/L — ABNORMAL LOW (ref 1.15–1.40)
HCT: 44 % (ref 39.0–52.0)
HCT: 43 % (ref 39.0–52.0)
HCT: 31 % — ABNORMAL LOW (ref 39.0–52.0)
HCT: 25 % — ABNORMAL LOW (ref 39.0–52.0)
HCT: 26 % — ABNORMAL LOW (ref 39.0–52.0)
Hemoglobin: 15 g/dL (ref 13.0–17.0)
Hemoglobin: 14.6 g/dL (ref 13.0–17.0)
Hemoglobin: 10.5 g/dL — ABNORMAL LOW (ref 13.0–17.0)
Hemoglobin: 8.5 g/dL — ABNORMAL LOW (ref 13.0–17.0)
Hemoglobin: 8.8 g/dL — ABNORMAL LOW (ref 13.0–17.0)
O2 Saturation: 88 %
O2 Saturation: 95 %
O2 Saturation: 100 %
O2 Saturation: 100 %
O2 Saturation: 100 %
Potassium: 2.5 mmol/L — CL (ref 3.5–5.1)
Potassium: 5 mmol/L (ref 3.5–5.1)
Potassium: 5.7 mmol/L — ABNORMAL HIGH (ref 3.5–5.1)
Potassium: 3.8 mmol/L (ref 3.5–5.1)
Potassium: 5.4 mmol/L — ABNORMAL HIGH (ref 3.5–5.1)
Sodium: 109 mmol/L — CL (ref 135–145)
Sodium: 141 mmol/L (ref 135–145)
Sodium: 139 mmol/L (ref 135–145)
Sodium: 136 mmol/L (ref 135–145)
Sodium: 143 mmol/L (ref 135–145)
TCO2: 17 mmol/L — ABNORMAL LOW (ref 22–32)
TCO2: 25 mmol/L (ref 22–32)
TCO2: 24 mmol/L (ref 22–32)
TCO2: 23 mmol/L (ref 22–32)
TCO2: 26 mmol/L (ref 22–32)
pCO2 arterial: 48.8 mmHg — ABNORMAL HIGH (ref 32–48)
pCO2 arterial: 43.4 mmHg (ref 32–48)
pCO2 arterial: 37.7 mmHg (ref 32–48)
pCO2 arterial: 38 mmHg (ref 32–48)
pCO2 arterial: 55.1 mmHg — ABNORMAL HIGH (ref 32–48)
pH, Arterial: 7.101 — CL (ref 7.35–7.45)
pH, Arterial: 7.354 (ref 7.35–7.45)
pH, Arterial: 7.382 (ref 7.35–7.45)
pH, Arterial: 7.197 — CL (ref 7.35–7.45)
pH, Arterial: 7.419 (ref 7.35–7.45)
pO2, Arterial: 74 mmHg — ABNORMAL LOW (ref 83–108)
pO2, Arterial: 81 mmHg — ABNORMAL LOW (ref 83–108)
pO2, Arterial: 348 mmHg — ABNORMAL HIGH (ref 83–108)
pO2, Arterial: 350 mmHg — ABNORMAL HIGH (ref 83–108)
pO2, Arterial: 553 mmHg — ABNORMAL HIGH (ref 83–108)

## 2024-02-01 LAB — LIPID PANEL
Cholesterol: 160 mg/dL (ref 0–200)
HDL: 31 mg/dL — ABNORMAL LOW (ref >40)
LDL Cholesterol: 93 mg/dL (ref 0–99)
Total CHOL/HDL Ratio: 5.2 ratio
Triglycerides: 179 mg/dL — ABNORMAL HIGH (ref <150)
VLDL: 36 mg/dL (ref 0–40)

## 2024-02-01 LAB — POCT I-STAT EG7
Acid-base deficit: 2 mmol/L (ref 0.0–2.0)
Bicarbonate: 24 mmol/L (ref 20.0–28.0)
Calcium, Ion: 1.05 mmol/L — ABNORMAL LOW (ref 1.15–1.40)
HCT: 32 % — ABNORMAL LOW (ref 39.0–52.0)
Hemoglobin: 10.9 g/dL — ABNORMAL LOW (ref 13.0–17.0)
O2 Saturation: 69 %
Potassium: 5.6 mmol/L — ABNORMAL HIGH (ref 3.5–5.1)
Sodium: 140 mmol/L (ref 135–145)
TCO2: 25 mmol/L (ref 22–32)
pCO2, Ven: 43.6 mmHg — ABNORMAL LOW (ref 44–60)
pH, Ven: 7.349 (ref 7.25–7.43)
pO2, Ven: 38 mmHg (ref 32–45)

## 2024-02-01 LAB — TROPONIN I (HIGH SENSITIVITY)
Troponin I (High Sensitivity): 12 ng/L (ref <18)
Troponin I (High Sensitivity): 23506 ng/L (ref <18)
Troponin I (High Sensitivity): >24000 ng/L (ref <18)

## 2024-02-01 LAB — LACTIC ACID, PLASMA
Lactic Acid, Venous: 3.8 mmol/L (ref 0.5–1.9)
Lactic Acid, Venous: 3.6 mmol/L (ref 0.5–1.9)

## 2024-02-01 LAB — PROTIME-INR
INR: 1.3 — ABNORMAL HIGH (ref 0.8–1.2)
Prothrombin Time: 16.2 s — ABNORMAL HIGH (ref 11.4–15.2)

## 2024-02-01 LAB — COMPREHENSIVE METABOLIC PANEL WITH GFR
ALT: 23 U/L (ref 0–44)
ALT: 34 U/L (ref 0–44)
AST: 28 U/L (ref 15–41)
AST: 158 U/L — ABNORMAL HIGH (ref 15–41)
Albumin: 3.6 g/dL (ref 3.5–5.0)
Albumin: 3.6 g/dL (ref 3.5–5.0)
Alkaline Phosphatase: 51 U/L (ref 38–126)
Alkaline Phosphatase: 51 U/L (ref 38–126)
Anion gap: 14 (ref 5–15)
Anion gap: 10 (ref 5–15)
BUN: 15 mg/dL (ref 8–23)
BUN: 16 mg/dL (ref 8–23)
CO2: 20 mmol/L — ABNORMAL LOW (ref 22–32)
CO2: 26 mmol/L (ref 22–32)
Calcium: 9.2 mg/dL (ref 8.9–10.3)
Calcium: 9.1 mg/dL (ref 8.9–10.3)
Chloride: 109 mmol/L (ref 98–111)
Chloride: 106 mmol/L (ref 98–111)
Creatinine, Ser: 1.3 mg/dL — ABNORMAL HIGH (ref 0.61–1.24)
Creatinine, Ser: 1.45 mg/dL — ABNORMAL HIGH (ref 0.61–1.24)
GFR, Estimated: 60 mL/min — ABNORMAL LOW (ref >60)
GFR, Estimated: 52 mL/min — ABNORMAL LOW (ref >60)
Glucose, Bld: 161 mg/dL — ABNORMAL HIGH (ref 70–99)
Glucose, Bld: 145 mg/dL — ABNORMAL HIGH (ref 70–99)
Potassium: 3.1 mmol/L — ABNORMAL LOW (ref 3.5–5.1)
Potassium: 3.7 mmol/L (ref 3.5–5.1)
Sodium: 143 mmol/L (ref 135–145)
Sodium: 142 mmol/L (ref 135–145)
Total Bilirubin: 0.7 mg/dL (ref 0.0–1.2)
Total Bilirubin: 0.7 mg/dL (ref 0.0–1.2)
Total Protein: 6.5 g/dL (ref 6.5–8.1)
Total Protein: 6.3 g/dL — ABNORMAL LOW (ref 6.5–8.1)

## 2024-02-01 LAB — MAGNESIUM: Magnesium: 1.9 mg/dL (ref 1.7–2.4)

## 2024-02-01 LAB — HEMOGLOBIN A1C
Hgb A1c MFr Bld: 5.4 % (ref 4.8–5.6)
Hgb A1c MFr Bld: 5.3 % (ref 4.8–5.6)
Mean Plasma Glucose: 108.28 mg/dL
Mean Plasma Glucose: 105.41 mg/dL

## 2024-02-01 LAB — ECHO INTRAOPERATIVE TEE
Height: 73 in
Weight: 4240 [oz_av]

## 2024-02-01 LAB — CBC
HCT: 46 % (ref 39.0–52.0)
Hemoglobin: 15.2 g/dL (ref 13.0–17.0)
MCH: 29.7 pg (ref 26.0–34.0)
MCHC: 33 g/dL (ref 30.0–36.0)
MCV: 90 fL (ref 80.0–100.0)
Platelets: 160 10*3/uL (ref 150–400)
RBC: 5.11 MIL/uL (ref 4.22–5.81)
RDW: 14.3 % (ref 11.5–15.5)
WBC: 18.1 10*3/uL — ABNORMAL HIGH (ref 4.0–10.5)
nRBC: 0 % (ref 0.0–0.2)

## 2024-02-01 LAB — POCT ACTIVATED CLOTTING TIME
Activated Clotting Time: 245 s
Activated Clotting Time: 262 s
Activated Clotting Time: 711 s

## 2024-02-01 LAB — HIV ANTIBODY (ROUTINE TESTING W REFLEX): HIV Screen 4th Generation wRfx: NONREACTIVE

## 2024-02-01 LAB — CG4 I-STAT (LACTIC ACID)
Lactic Acid, Venous: 1.7 mmol/L (ref 0.5–1.9)
Lactic Acid, Venous: 4.4 mmol/L (ref 0.5–1.9)

## 2024-02-01 LAB — ABO/RH: ABO/RH(D): B NEG

## 2024-02-01 LAB — APTT: aPTT: 32 s (ref 24–36)

## 2024-02-01 LAB — PREPARE RBC (CROSSMATCH)

## 2024-02-01 LAB — MRSA NEXT GEN BY PCR, NASAL: MRSA by PCR Next Gen: NOT DETECTED

## 2024-02-01 SURGERY — CORONARY/GRAFT ACUTE MI REVASCULARIZATION
Anesthesia: LOCAL

## 2024-02-01 SURGERY — REPAIR, ANEURYSM, AORTA, THORACIC, ASCENDING
Anesthesia: General | Site: Chest

## 2024-02-01 MED ORDER — PLASMA-LYTE A IV SOLN
INTRAVENOUS | Status: DC
  Filled 2024-02-01: qty 2.5

## 2024-02-01 MED ORDER — VASOPRESSIN 20 UNITS/100 ML INFUSION FOR SHOCK
0.0000 [IU]/min | INTRAVENOUS | Status: DC
  Administered 2024-02-02: .03 [IU]/min via INTRAVENOUS
  Filled 2024-02-01 (×2): qty 100

## 2024-02-01 MED ORDER — PHENYLEPHRINE 80 MCG/ML (10ML) SYRINGE FOR IV PUSH (FOR BLOOD PRESSURE SUPPORT)
PREFILLED_SYRINGE | INTRAVENOUS | Status: DC | PRN
  Administered 2024-02-01 (×2): 160 ug via INTRAVENOUS
  Administered 2024-02-01: 240 ug via INTRAVENOUS
  Administered 2024-02-02: 160 ug via INTRAVENOUS

## 2024-02-01 MED ORDER — SODIUM CHLORIDE 0.9 % IV SOLN
INTRAVENOUS | Status: AC | PRN
  Administered 2024-02-01: 250 mL via INTRAVENOUS

## 2024-02-01 MED ORDER — EPHEDRINE SULFATE (PRESSORS) 50 MG/ML IJ SOLN
INTRAMUSCULAR | Status: DC | PRN
  Administered 2024-02-01: 5 mg via INTRAVENOUS

## 2024-02-01 MED ORDER — SODIUM CHLORIDE 0.9 % IV SOLN: INTRAVENOUS | Status: DC | PRN

## 2024-02-01 MED ORDER — CALCIUM CHLORIDE 10 % IV SOLN
INTRAVENOUS | Status: AC
  Filled 2024-02-01: qty 10

## 2024-02-01 MED ORDER — PRASUGREL HCL 10 MG PO TABS: 10.0000 mg | ORAL_TABLET | Freq: Every day | ORAL | Status: DC

## 2024-02-01 MED ORDER — PROPOFOL 10 MG/ML IV BOLUS
0.5000 mg/kg | Freq: Once | INTRAVENOUS | Status: DC
  Filled 2024-02-01: qty 20

## 2024-02-01 MED ORDER — INSULIN REGULAR(HUMAN) IN NACL 100-0.9 UT/100ML-% IV SOLN
INTRAVENOUS | Status: AC
  Administered 2024-02-01: 1 [IU]/h via INTRAVENOUS
  Filled 2024-02-01: qty 100

## 2024-02-01 MED ORDER — ACETAMINOPHEN 325 MG PO TABS: 650.0000 mg | ORAL_TABLET | ORAL | Status: DC | PRN

## 2024-02-01 MED ORDER — NOREPINEPHRINE 4 MG/250ML-% IV SOLN
INTRAVENOUS | Status: AC
  Filled 2024-02-01: qty 250

## 2024-02-01 MED ORDER — SODIUM CHLORIDE 0.9% FLUSH
3.0000 mL | Freq: Two times a day (BID) | INTRAVENOUS | Status: DC
  Administered 2024-02-01 (×2): 3 mL via INTRAVENOUS

## 2024-02-01 MED ORDER — PERFLUTREN LIPID MICROSPHERE
1.0000 mL | INTRAVENOUS | Status: AC | PRN
  Administered 2024-02-01: 2 mL via INTRAVENOUS

## 2024-02-01 MED ORDER — PROPOFOL 10 MG/ML IV BOLUS
INTRAVENOUS | Status: AC
  Filled 2024-02-01: qty 20

## 2024-02-01 MED ORDER — MIDAZOLAM HCL 2 MG/2ML IJ SOLN
INTRAMUSCULAR | Status: AC
  Filled 2024-02-01: qty 2

## 2024-02-01 MED ORDER — SODIUM CHLORIDE 0.9% FLUSH: 3.0000 mL | INTRAVENOUS | Status: DC | PRN

## 2024-02-01 MED ORDER — PROPOFOL 10 MG/ML IV BOLUS
INTRAVENOUS | Status: DC | PRN
  Administered 2024-02-01: 50 mg via INTRAVENOUS
  Administered 2024-02-01: 30 mg via INTRAVENOUS
  Administered 2024-02-01: 50 mg via INTRAVENOUS
  Administered 2024-02-01: 30 mg via INTRAVENOUS
  Administered 2024-02-01: 50 mg via INTRAVENOUS

## 2024-02-01 MED ORDER — MIDAZOLAM HCL 2 MG/2ML IJ SOLN
INTRAMUSCULAR | Status: DC | PRN
  Administered 2024-02-01 (×2): 1 mg via INTRAVENOUS

## 2024-02-01 MED ORDER — MAGNESIUM SULFATE 2 GM/50ML IV SOLN
2.0000 g | Freq: Once | INTRAVENOUS | Status: DC
Start: 2024-02-01 — End: 2024-02-02

## 2024-02-01 MED ORDER — FENTANYL CITRATE (PF) 250 MCG/5ML IJ SOLN
INTRAMUSCULAR | Status: AC
  Filled 2024-02-01: qty 5

## 2024-02-01 MED ORDER — TRANEXAMIC ACID (OHS) PUMP PRIME SOLUTION
2.0000 mg/kg | INTRAVENOUS | Status: DC
  Filled 2024-02-01: qty 2.4

## 2024-02-01 MED ORDER — TRANEXAMIC ACID (OHS) BOLUS VIA INFUSION
15.0000 mg/kg | INTRAVENOUS | Status: AC
  Administered 2024-02-01: 1803 mg via INTRAVENOUS
  Filled 2024-02-01: qty 1803

## 2024-02-01 MED ORDER — VECURONIUM BROMIDE 10 MG IV SOLR
INTRAVENOUS | Status: DC | PRN
  Administered 2024-02-01: 5 mg via INTRAVENOUS
  Administered 2024-02-01: 3 mg via INTRAVENOUS
  Administered 2024-02-02: 4 mg via INTRAVENOUS
  Administered 2024-02-02: 5 mg via INTRAVENOUS
  Administered 2024-02-02: 3 mg via INTRAVENOUS

## 2024-02-01 MED ORDER — FENTANYL CITRATE PF 50 MCG/ML IJ SOSY
50.0000 ug | PREFILLED_SYRINGE | Freq: Once | INTRAMUSCULAR | Status: AC
  Administered 2024-02-01: 50 ug via INTRAVENOUS

## 2024-02-01 MED ORDER — SODIUM CHLORIDE 0.9 % IV SOLN: 250.0000 mL | INTRAVENOUS | Status: DC

## 2024-02-01 MED ORDER — TRANEXAMIC ACID 1000 MG/10ML IV SOLN
1.5000 mg/kg/h | INTRAVENOUS | Status: AC
  Administered 2024-02-01: 1.5 mg/kg/h via INTRAVENOUS
  Filled 2024-02-01: qty 25

## 2024-02-01 MED ORDER — ONDANSETRON HCL 4 MG/2ML IJ SOLN: 4.0000 mg | Freq: Four times a day (QID) | INTRAMUSCULAR | Status: DC | PRN

## 2024-02-01 MED ORDER — MUPIROCIN 2 % EX OINT
1.0000 | TOPICAL_OINTMENT | Freq: Two times a day (BID) | CUTANEOUS | Status: AC
  Administered 2024-02-02 – 2024-02-06 (×8): 1 via NASAL
  Filled 2024-02-01: qty 22

## 2024-02-01 MED ORDER — LACTATED RINGERS IV SOLN: INTRAVENOUS | Status: DC | PRN

## 2024-02-01 MED ORDER — ARTIFICIAL TEARS OPHTHALMIC OINT
TOPICAL_OINTMENT | OPHTHALMIC | Status: DC | PRN
  Administered 2024-02-01: 1 via OPHTHALMIC

## 2024-02-01 MED ORDER — PANTOPRAZOLE SODIUM 20 MG PO TBEC
20.0000 mg | DELAYED_RELEASE_TABLET | Freq: Every day | ORAL | Status: DC
  Filled 2024-02-01 (×2): qty 1

## 2024-02-01 MED ORDER — VERAPAMIL HCL 2.5 MG/ML IV SOLN
INTRAVENOUS | Status: AC
Start: 2024-02-01 — End: ?
  Filled 2024-02-01: qty 2

## 2024-02-01 MED ORDER — VECURONIUM BROMIDE 10 MG IV SOLR
INTRAVENOUS | Status: AC
  Filled 2024-02-01: qty 10

## 2024-02-01 MED ORDER — ASPIRIN 81 MG PO CHEW
81.0000 mg | CHEWABLE_TABLET | Freq: Every day | ORAL | Status: DC
Start: 2024-02-02 — End: 2024-02-02

## 2024-02-01 MED ORDER — ENOXAPARIN SODIUM 40 MG/0.4ML IJ SOSY: 40.0000 mg | PREFILLED_SYRINGE | INTRAMUSCULAR | Status: DC

## 2024-02-01 MED ORDER — PROTAMINE SULFATE 10 MG/ML IV SOLN
INTRAVENOUS | Status: AC
Start: 2024-02-01 — End: ?
  Filled 2024-02-01: qty 50

## 2024-02-01 MED ORDER — HEMOSTATIC AGENTS (NO CHARGE) OPTIME
TOPICAL | Status: DC | PRN
  Administered 2024-02-01 – 2024-02-02 (×12): 1 via TOPICAL

## 2024-02-01 MED ORDER — THROMBIN (RECOMBINANT) 20000 UNITS EX SOLR
CUTANEOUS | Status: DC | PRN
  Administered 2024-02-01: 20000 [IU] via TOPICAL

## 2024-02-01 MED ORDER — SODIUM CHLORIDE 0.9% IV SOLUTION: Freq: Once | INTRAVENOUS | Status: DC

## 2024-02-01 MED ORDER — POTASSIUM CHLORIDE 2 MEQ/ML IV SOLN
80.0000 meq | INTRAVENOUS | Status: DC
  Filled 2024-02-01: qty 40

## 2024-02-01 MED ORDER — PHENYLEPHRINE HCL-NACL 20-0.9 MG/250ML-% IV SOLN
30.0000 ug/min | INTRAVENOUS | Status: DC
  Filled 2024-02-01: qty 250

## 2024-02-01 MED ORDER — 0.9 % SODIUM CHLORIDE (POUR BTL) OPTIME
TOPICAL | Status: DC | PRN
  Administered 2024-02-01: 5000 mL
  Administered 2024-02-01 – 2024-02-02 (×2): 1000 mL

## 2024-02-01 MED ORDER — FENTANYL CITRATE (PF) 100 MCG/2ML IJ SOLN
INTRAMUSCULAR | Status: AC
  Filled 2024-02-01: qty 2

## 2024-02-01 MED ORDER — PROPOFOL 10 MG/ML IV BOLUS
50.0000 mg | Freq: Once | INTRAVENOUS | Status: AC
  Administered 2024-02-01: 50 mg via INTRAVENOUS

## 2024-02-01 MED ORDER — HEPARIN SODIUM (PORCINE) 1000 UNIT/ML IJ SOLN
INTRAMUSCULAR | Status: DC | PRN
Start: 2024-02-01 — End: 2024-02-02
  Administered 2024-02-01: 40000 [IU] via INTRAVENOUS

## 2024-02-01 MED ORDER — ARTIFICIAL TEARS OPHTHALMIC OINT
TOPICAL_OINTMENT | OPHTHALMIC | Status: AC
  Filled 2024-02-01: qty 3.5

## 2024-02-01 MED ORDER — FENTANYL CITRATE (PF) 250 MCG/5ML IJ SOLN
INTRAMUSCULAR | Status: AC
Start: 2024-02-01 — End: ?
  Filled 2024-02-01: qty 5

## 2024-02-01 MED ORDER — FENTANYL CITRATE PF 50 MCG/ML IJ SOSY
100.0000 ug | PREFILLED_SYRINGE | Freq: Once | INTRAMUSCULAR | Status: AC
  Administered 2024-02-01: 50 ug via INTRAVENOUS
  Filled 2024-02-01: qty 2

## 2024-02-01 MED ORDER — LIDOCAINE HCL (PF) 1 % IJ SOLN
INTRAMUSCULAR | Status: DC | PRN
  Administered 2024-02-01: 2 mL

## 2024-02-01 MED ORDER — SODIUM CHLORIDE (PF) 0.9 % IJ SOLN
INTRAMUSCULAR | Status: AC
  Filled 2024-02-01: qty 10

## 2024-02-01 MED ORDER — IOHEXOL 350 MG/ML SOLN
INTRAVENOUS | Status: DC | PRN
  Administered 2024-02-01: 140 mL

## 2024-02-01 MED ORDER — PHENYLEPHRINE HCL-NACL 20-0.9 MG/250ML-% IV SOLN
INTRAVENOUS | Status: DC | PRN
  Administered 2024-02-01: 30 ug/min via INTRAVENOUS
  Administered 2024-02-02 (×2): 160 ug via INTRAVENOUS

## 2024-02-01 MED ORDER — EPINEPHRINE HCL 5 MG/250ML IV SOLN IN NS
0.0000 ug/min | INTRAVENOUS | Status: AC
Start: 2024-02-01 — End: 2024-02-02
  Administered 2024-02-02: 2 ug/min via INTRAVENOUS
  Filled 2024-02-01: qty 250

## 2024-02-01 MED ORDER — THROMBIN 20000 UNITS EX SOLR
OROMUCOSAL | Status: DC | PRN
  Administered 2024-02-01 (×5): 4 mL via TOPICAL

## 2024-02-01 MED ORDER — HYDROCORTISONE SOD SUC (PF) 100 MG IJ SOLR
INTRAMUSCULAR | Status: DC | PRN
  Administered 2024-02-01: 125 mg via INTRAVENOUS

## 2024-02-01 MED ORDER — ATORVASTATIN CALCIUM 80 MG PO TABS
80.0000 mg | ORAL_TABLET | Freq: Every day | ORAL | Status: DC
  Administered 2024-02-02: 80 mg via ORAL
  Filled 2024-02-01: qty 1

## 2024-02-01 MED ORDER — PRASUGREL HCL 10 MG PO TABS
ORAL_TABLET | ORAL | Status: DC | PRN
  Administered 2024-02-01: 60 mg via ORAL

## 2024-02-01 MED ORDER — HEPARIN SODIUM (PORCINE) 1000 UNIT/ML IJ SOLN
INTRAMUSCULAR | Status: AC
  Filled 2024-02-01: qty 10

## 2024-02-01 MED ORDER — MANNITOL 20 % IV SOLN
INTRAVENOUS | Status: DC
  Filled 2024-02-01 (×2): qty 13

## 2024-02-01 MED ORDER — IOHEXOL 350 MG/ML SOLN
100.0000 mL | Freq: Once | INTRAVENOUS | Status: AC | PRN
  Administered 2024-02-01: 100 mL via INTRAVENOUS

## 2024-02-01 MED ORDER — CEFAZOLIN SODIUM-DEXTROSE 3-4 GM/150ML-% IV SOLN
3.0000 g | INTRAVENOUS | Status: AC
  Administered 2024-02-01 – 2024-02-02 (×3): 3 g via INTRAVENOUS
  Filled 2024-02-01: qty 150

## 2024-02-01 MED ORDER — SODIUM BICARBONATE 8.4 % IV SOLN
INTRAVENOUS | Status: AC
  Filled 2024-02-01: qty 50

## 2024-02-01 MED ORDER — ESMOLOL HCL 100 MG/10ML IV SOLN
INTRAVENOUS | Status: DC | PRN
  Administered 2024-02-01: 40 mg via INTRAVENOUS

## 2024-02-01 MED ORDER — LIDOCAINE HCL (PF) 1 % IJ SOLN
INTRAMUSCULAR | Status: AC
  Filled 2024-02-01: qty 30

## 2024-02-01 MED ORDER — SODIUM CHLORIDE 0.9 % IV SOLN: 250.0000 mL | INTRAVENOUS | Status: DC | PRN

## 2024-02-01 MED ORDER — VANCOMYCIN HCL 1.5 G IV SOLR
1500.0000 mg | INTRAVENOUS | Status: AC
  Administered 2024-02-01: 1500 mg via INTRAVENOUS
  Filled 2024-02-01: qty 30

## 2024-02-01 MED ORDER — NITROGLYCERIN 1 MG/10 ML FOR IR/CATH LAB
INTRA_ARTERIAL | Status: DC | PRN
  Administered 2024-02-01: 200 ug via INTRACORONARY

## 2024-02-01 MED ORDER — NOREPINEPHRINE 4 MG/250ML-% IV SOLN
0.0000 ug/min | INTRAVENOUS | Status: DC
  Administered 2024-02-02: 10 ug/min via INTRAVENOUS
  Filled 2024-02-01: qty 250

## 2024-02-01 MED ORDER — HEPARIN SODIUM (PORCINE) 1000 UNIT/ML IJ SOLN
INTRAMUSCULAR | Status: DC | PRN
  Administered 2024-02-01: 11000 [IU] via INTRAVENOUS
  Administered 2024-02-01: 3000 [IU] via INTRAVENOUS

## 2024-02-01 MED ORDER — TRANEXAMIC ACID 1000 MG/10ML IV SOLN
1.5000 mg/kg/h | INTRAVENOUS | Status: DC
  Filled 2024-02-01: qty 25

## 2024-02-01 MED ORDER — NOREPINEPHRINE 4 MG/250ML-% IV SOLN
0.0000 ug/min | INTRAVENOUS | Status: AC
  Administered 2024-02-02: 17 ug/min via INTRAVENOUS
  Filled 2024-02-01 (×2): qty 250

## 2024-02-01 MED ORDER — PROPOFOL 1000 MG/100ML IV EMUL
INTRAVENOUS | Status: AC
  Filled 2024-02-01: qty 100

## 2024-02-01 MED ORDER — PLASMA-LYTE A IV SOLN
INTRAVENOUS | Status: DC | PRN
  Administered 2024-02-01: 500 mL via INTRAVASCULAR

## 2024-02-01 MED ORDER — PRASUGREL HCL 10 MG PO TABS
ORAL_TABLET | ORAL | Status: AC
  Filled 2024-02-01: qty 6

## 2024-02-01 MED ORDER — POTASSIUM CHLORIDE CRYS ER 20 MEQ PO TBCR: 20.0000 meq | EXTENDED_RELEASE_TABLET | Freq: Once | ORAL | Status: DC

## 2024-02-01 MED ORDER — VERAPAMIL HCL 2.5 MG/ML IV SOLN
INTRAVENOUS | Status: DC | PRN
  Administered 2024-02-01: 10 mL via INTRA_ARTERIAL

## 2024-02-01 MED ORDER — NITROGLYCERIN IN D5W 200-5 MCG/ML-% IV SOLN
2.0000 ug/min | INTRAVENOUS | Status: DC
  Filled 2024-02-01: qty 250

## 2024-02-01 MED ORDER — NITROGLYCERIN 0.4 MG SL SUBL
0.4000 mg | SUBLINGUAL_TABLET | SUBLINGUAL | Status: DC | PRN
  Administered 2024-02-01 (×3): 0.4 mg via SUBLINGUAL
  Filled 2024-02-01 (×4): qty 1

## 2024-02-01 MED ORDER — FENTANYL CITRATE (PF) 100 MCG/2ML IJ SOLN
INTRAMUSCULAR | Status: DC | PRN
  Administered 2024-02-01 (×2): 25 ug via INTRAVENOUS

## 2024-02-01 MED ORDER — HEPARIN 30,000 UNITS/1000 ML (OHS) CELLSAVER SOLUTION
Status: DC
  Filled 2024-02-01: qty 1000

## 2024-02-01 MED ORDER — MIDAZOLAM HCL (PF) 5 MG/ML IJ SOLN
INTRAMUSCULAR | Status: DC | PRN
  Administered 2024-02-01 – 2024-02-02 (×4): 1 mg via INTRAVENOUS

## 2024-02-01 MED ORDER — HEPARIN (PORCINE) IN NACL 1000-0.9 UT/500ML-% IV SOLN
INTRAVENOUS | Status: DC | PRN
  Administered 2024-02-01: 1000 mL via SURGICAL_CAVITY

## 2024-02-01 MED ORDER — ROCURONIUM BROMIDE 100 MG/10ML IV SOLN
INTRAVENOUS | Status: DC | PRN
Start: 2024-02-01 — End: 2024-02-02
  Administered 2024-02-01: 100 mg via INTRAVENOUS

## 2024-02-01 MED ORDER — DEXMEDETOMIDINE HCL IN NACL 400 MCG/100ML IV SOLN
0.1000 ug/kg/h | INTRAVENOUS | Status: AC
  Administered 2024-02-02: .7 ug/kg/h via INTRAVENOUS
  Filled 2024-02-01 (×2): qty 100

## 2024-02-01 MED ORDER — CEFAZOLIN SODIUM-DEXTROSE 2-4 GM/100ML-% IV SOLN
2.0000 g | INTRAVENOUS | Status: DC
  Filled 2024-02-01: qty 100

## 2024-02-01 MED ORDER — NOREPINEPHRINE 4 MG/250ML-% IV SOLN
INTRAVENOUS | Status: DC | PRN
  Administered 2024-02-01: 4 ug/min via INTRAVENOUS

## 2024-02-01 MED ORDER — FENTANYL CITRATE (PF) 100 MCG/2ML IJ SOLN
INTRAMUSCULAR | Status: DC | PRN
  Administered 2024-02-01: 150 ug via INTRAVENOUS
  Administered 2024-02-01: 300 ug via INTRAVENOUS
  Administered 2024-02-01: 50 ug via INTRAVENOUS
  Administered 2024-02-01: 250 ug via INTRAVENOUS
  Administered 2024-02-01 – 2024-02-02 (×3): 50 ug via INTRAVENOUS

## 2024-02-01 MED ORDER — MILRINONE LACTATE IN DEXTROSE 20-5 MG/100ML-% IV SOLN
0.3000 ug/kg/min | INTRAVENOUS | Status: DC
  Filled 2024-02-01: qty 100

## 2024-02-01 MED ORDER — THROMBIN (RECOMBINANT) 20000 UNITS EX SOLR
CUTANEOUS | Status: AC
  Filled 2024-02-01: qty 20000

## 2024-02-01 MED ORDER — CEFAZOLIN SODIUM 1 G IJ SOLR
INTRAMUSCULAR | Status: AC
  Filled 2024-02-01: qty 10

## 2024-02-01 SURGICAL SUPPLY — 94 items
ADAPTER CARDIO PERF ANTE/RETRO (ADAPTER) ×1 IMPLANT
APPLICATOR PREVELEAK SEALANT (TIP) IMPLANT
BAG DECANTER FOR FLEXI CONT (MISCELLANEOUS) ×1 IMPLANT
BLADE CLIPPER SURG (BLADE) ×1 IMPLANT
BLADE STERNUM SYSTEM 6 (BLADE) ×1 IMPLANT
BLADE SURG 11 STRL SS (BLADE) IMPLANT
BLADE SURG 15 STRL LF DISP TIS (BLADE) ×1 IMPLANT
CANISTER SUCT 3000ML PPV (MISCELLANEOUS) ×1 IMPLANT
CANNULA GUNDRY RCSP 15FR (MISCELLANEOUS) ×1 IMPLANT
CANNULA MC2 2 STG 36/46 CONN (CANNULA) IMPLANT
CATH HEART VENT LEFT (CATHETERS) ×1 IMPLANT
CATH ROBINSON RED A/P 18FR (CATHETERS) ×3 IMPLANT
CATH THORACIC 36FR (CATHETERS) ×1 IMPLANT
CATH THORACIC 36FR RT ANG (CATHETERS) ×1 IMPLANT
CAUTERY SURG HI TEMP FINE TIP (MISCELLANEOUS) IMPLANT
CIRCUIT VACPAC SAFETY (MISCELLANEOUS) IMPLANT
CNTNR URN SCR LID CUP LEK RST (MISCELLANEOUS) IMPLANT
CONN ST 1/4X3/8 BEN (MISCELLANEOUS) IMPLANT
CONN Y 3/8X3/8X3/8 BEN (MISCELLANEOUS) IMPLANT
DRAPE WARM FLUID 44X44 (DRAPES) IMPLANT
DRSG COVADERM 4X14 (GAUZE/BANDAGES/DRESSINGS) ×1 IMPLANT
DRSG COVADERM 4X6 (GAUZE/BANDAGES/DRESSINGS) IMPLANT
ELECT CAUTERY BLADE 6.4 (BLADE) ×1 IMPLANT
ELECT REM PT RETURN 9FT ADLT (ELECTROSURGICAL) ×2 IMPLANT
ELECTRODE REM PT RTRN 9FT ADLT (ELECTROSURGICAL) ×2 IMPLANT
FELT TEFLON 6X6 (MISCELLANEOUS) IMPLANT
GAUZE 4X4 16PLY ~~LOC~~+RFID DBL (SPONGE) ×1 IMPLANT
GAUZE SPONGE 4X4 12PLY STRL (GAUZE/BANDAGES/DRESSINGS) ×1 IMPLANT
GLOVE BIO SURGEON STRL SZ 6 (GLOVE) IMPLANT
GLOVE BIO SURGEON STRL SZ 6.5 (GLOVE) IMPLANT
GLOVE BIO SURGEON STRL SZ7 (GLOVE) IMPLANT
GLOVE BIO SURGEON STRL SZ7.5 (GLOVE) IMPLANT
GLOVE BIOGEL PI IND STRL 7.0 (GLOVE) IMPLANT
GLOVE ECLIPSE 7.0 STRL STRAW (GLOVE) IMPLANT
GLOVE SURG MICRO LTX SZ7 (GLOVE) ×2 IMPLANT
GOWN STRL REUS W/ TWL LRG LVL3 (GOWN DISPOSABLE) ×4 IMPLANT
GOWN STRL REUS W/ TWL XL LVL3 (GOWN DISPOSABLE) ×1 IMPLANT
GRAFT 4 BRANCH 32X50 (Prosthesis & Implant Heart) IMPLANT
GRAFT CV 30X8WVN NDL (Graft) IMPLANT
HEMOSTAT POWDER SURGIFOAM 1G (HEMOSTASIS) ×2 IMPLANT
HEMOSTAT SURGICEL 2X14 (HEMOSTASIS) ×1 IMPLANT
INSERT FOGARTY 61MM (MISCELLANEOUS) IMPLANT
INSERT FOGARTY SM (MISCELLANEOUS) ×1 IMPLANT
INSERT FOGARTY XLG (MISCELLANEOUS) IMPLANT
KIT BASIN OR (CUSTOM PROCEDURE TRAY) ×1 IMPLANT
KIT CATH CPB BARTLE (MISCELLANEOUS) IMPLANT
KIT SUCTION CATH 14FR (SUCTIONS) ×1 IMPLANT
KIT TURNOVER KIT B (KITS) ×1 IMPLANT
LINE VENT (MISCELLANEOUS) IMPLANT
LOOP VASCLR MAXI BLUE 18IN ST (MISCELLANEOUS) IMPLANT
LOOPS VASCLR MAXI BLUE 18IN ST (MISCELLANEOUS) ×1 IMPLANT
NDL AORTIC AIR ASPIRATING (NEEDLE) IMPLANT
NEEDLE AORTIC AIR ASPIRATING (NEEDLE) IMPLANT
NS IRRIG 1000ML POUR BTL (IV SOLUTION) ×5 IMPLANT
PACK E OPEN HEART (SUTURE) ×1 IMPLANT
PACK OPEN HEART (CUSTOM PROCEDURE TRAY) ×1 IMPLANT
PAD ARMBOARD POSITIONER FOAM (MISCELLANEOUS) ×2 IMPLANT
POSITIONER HEAD DONUT 9IN (MISCELLANEOUS) ×1 IMPLANT
SEALANT HEMOST PREVELEAK 4ML (HEMOSTASIS) IMPLANT
SEALANT SURG COSEAL 4ML (VASCULAR PRODUCTS) IMPLANT
SEALANT SURG COSEAL 8ML (VASCULAR PRODUCTS) IMPLANT
SET MPS 3-ND DEL (MISCELLANEOUS) IMPLANT
SET VEIN GRAFT PERF (SET/KITS/TRAYS/PACK) IMPLANT
SPONGE T-LAP 18X18 ~~LOC~~+RFID (SPONGE) ×4 IMPLANT
SPONGE T-LAP 4X18 ~~LOC~~+RFID (SPONGE) ×2 IMPLANT
STOPCOCK 4 WAY LG BORE MALE ST (IV SETS) IMPLANT
SUT EB EXC GRN/WHT 2-0 V-5 (SUTURE) IMPLANT
SUT ETHIBON EXCEL 2-0 V-5 (SUTURE) IMPLANT
SUT ETHIBOND 2 0 SH 36X2 (SUTURE) IMPLANT
SUT ETHIBOND V-5 VALVE (SUTURE) IMPLANT
SUT PROLENE 3 0 RB 1 (SUTURE) IMPLANT
SUT PROLENE 3 0 SH DA (SUTURE) ×1 IMPLANT
SUT PROLENE 3 0 SH1 36 (SUTURE) IMPLANT
SUT PROLENE 4 0 SH DA (SUTURE) IMPLANT
SUT PROLENE 4-0 RB1 .5 CRCL 36 (SUTURE) ×3 IMPLANT
SUT PROLENE 5 0 C 1 36 (SUTURE) IMPLANT
SUT PROLENE 6 0 C 1 30 (SUTURE) IMPLANT
SUT SILK 1 TIES 10X30 (SUTURE) IMPLANT
SUT STEEL STERNAL CCS#1 18IN (SUTURE) IMPLANT
SUT STEEL SZ 6 DBL 3X14 BALL (SUTURE) IMPLANT
SUT VIC AB 1 CTX36XBRD ANBCTR (SUTURE) ×2 IMPLANT
SUT VIC AB 2-0 CT1 TAPERPNT 27 (SUTURE) IMPLANT
SUT VIC AB 3-0 SH 27X BRD (SUTURE) IMPLANT
SUT VIC AB 3-0 X1 27 (SUTURE) IMPLANT
SYSTEM SAHARA CHEST DRAIN ATS (WOUND CARE) ×1 IMPLANT
TAPE CLOTH 4X10 WHT NS (GAUZE/BANDAGES/DRESSINGS) IMPLANT
TAPE PAPER 2X10 WHT MICROPORE (GAUZE/BANDAGES/DRESSINGS) IMPLANT
TIE VASCULAR MAXI BLUE 18IN ST (MISCELLANEOUS) ×1 IMPLANT
TOWEL GREEN STERILE (TOWEL DISPOSABLE) ×1 IMPLANT
TOWEL GREEN STERILE FF (TOWEL DISPOSABLE) ×1 IMPLANT
VALVE AORTIC KONECT RESILIA 27 (Valve) IMPLANT
VENT LEFT HEART 12002 (CATHETERS) ×1 IMPLANT
WATER STERILE IRR 1000ML POUR (IV SOLUTION) ×2 IMPLANT
YANKAUER SUCT BULB TIP NO VENT (SUCTIONS) IMPLANT

## 2024-02-01 SURGICAL SUPPLY — 23 items
BALLN EMERGE MR 2.0X12 (BALLOONS) ×1 IMPLANT
BALLOON EMERGE MR 2.0X12 (BALLOONS) IMPLANT
CATH INFINITI 5 FR JL3.5 (CATHETERS) IMPLANT
CATH INFINITI JR4 5F (CATHETERS) IMPLANT
CATH LAUNCHER 6FR JR4 (CATHETERS) IMPLANT
DEVICE RAD COMP TR BAND LRG (VASCULAR PRODUCTS) IMPLANT
ELECT DEFIB PAD ADLT CADENCE (PAD) IMPLANT
GLIDESHEATH SLEND SS 6F .021 (SHEATH) IMPLANT
GUIDEWIRE INQWIRE 1.5J.035X260 (WIRE) IMPLANT
INQWIRE 1.5J .035X260CM (WIRE) ×1 IMPLANT
KIT ENCORE 26 ADVANTAGE (KITS) IMPLANT
KIT SYRINGE INJ CVI SPIKEX1 (MISCELLANEOUS) IMPLANT
PACK CARDIAC CATHETERIZATION (CUSTOM PROCEDURE TRAY) ×1 IMPLANT
SET ATX-X65L (MISCELLANEOUS) IMPLANT
SHEATH 6FR 85 DEST SLENDER (SHEATH) IMPLANT
SHEATH PROBE COVER 6X72 (BAG) IMPLANT
STENT SYNERGY XD 2.50X38 (Permanent Stent) IMPLANT
STENT SYNERGY XD 3.0X32 (Permanent Stent) IMPLANT
STENT SYNERGY XD 3.0X38 (Permanent Stent) IMPLANT
STENT SYS SYNERGY XD 3.0X38 (Permanent Stent) ×1 IMPLANT
SYNERGY XD 2.50X38 (Permanent Stent) ×1 IMPLANT
TUBING CIL FLEX 10 FLL-RA (TUBING) IMPLANT
WIRE ASAHI PROWATER 180CM (WIRE) IMPLANT

## 2024-02-01 NOTE — Consult Note (Signed)
 Advanced Heart Failure Team Consult Note   Primary Physician: No primary care provider on file. Cardiologist:  None  Reason for Consultation: Cardiogenic shock post PCI  HPI:    Dustin Davenport is seen today for evaluation of cardiogenic shock post PCI at the request of Dr. Filiberto Hug, cardiology.   Dustin Davenport is a 68 y.o. male with HTN and HLD. Presented this morning with sudden onset chest pain that started around 8 am. He had never experienced pain like this before. Only symptom recalled was recent fatigue. EMS was called and EKG showed STEMI. HsTrop 23K. Taken to the cath lab on arrival. 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 Lactic initially cleared in cath lab but started to trend back up once in ICU. Had recurrent chest pain on arrival to unit that resolved with nitroglycerin, suspect reperfusion pains. Echo done at bedside, reviewed with Dr. Bruce Caper: EF 55-60%, RV mild-mod reduced, severe ascending aortic aneurysm 5-5.7cm. Lactic last 3.8. With wide pulse pressure.   Resting comfortably in bed. Wife at bedside. Of note he's on BP meds at home. Follows regularly with PCP. Not a smoker (quit in early 74s).   Home Medications Prior to Admission medications   Medication Sig Start Date End Date Taking? Authorizing Provider  acetaminophen (TYLENOL) 500 MG tablet Take 1,000 mg by mouth 2 (two) times daily as needed for mild pain (pain score 1-3), moderate pain (pain score 4-6), fever or headache.   Yes [provider]  chlorthalidone (HYGROTON) 25 MG tablet Take 25 mg by mouth daily.   Yes [provider]  losartan (COZAAR) 100 MG tablet Take 50 mg by mouth daily. 11/22/23  Yes [provider]  metoprolol succinate (TOPROL-XL) 100 MG 24 hr tablet Take 50 mg by mouth daily. Take with or immediately following a meal.   Yes [provider]   potassium chloride (KLOR-CON) 10 MEQ tablet Take 10 mEq by mouth daily. 01/17/24  Yes [provider]    Past Medical History: Past Medical History:  Diagnosis Date   Hypertension     Past Surgical History: No past surgical history on file.  Family History: No family history on file.  Social History: Social History   Socioeconomic History   Marital status: Married    Spouse name: Not on file   Number of children: Not on file   Years of education: Not on file   Highest education level: Not on file  Occupational History   Not on file  Tobacco Use   Smoking status: Not on file   Smokeless tobacco: Not on file  Substance and Sexual Activity   Alcohol use: Not on file   Drug use: Not on file   Sexual activity: Not on file  Other Topics Concern   Not on file  Social History Narrative   Not on file   Social Drivers of Health   Financial Resource Strain: Not on file  Food Insecurity: Not on file  Transportation Needs: Not on file  Physical Activity: Not on file  Stress: Not on file  Social Connections: Not on file    Allergies:  Allergies  Allergen Reactions   Bee Venom     Objective:    Vital Signs:   Pulse Rate:  [64] 64 (04/15 1400) Resp:  [17] 17 (04/15 1400) BP: (127)/(37) 127/37 (04/15 1400) SpO2:  [  90 %-97 %] 97 % (04/15 1400) Weight:  [120.2 kg] 120.2 kg (04/15 0932)    Weight change: Filed Weights   02/01/24 0932  Weight: 120.2 kg    Intake/Output:   Intake/Output Summary (Last 24 hours) at 02/01/2024 1431 Last data filed at 02/01/2024 1330 Gross per 24 hour  Intake --  Output 100 ml  Net -100 ml    Physical Exam    General:  well appearing.  No respiratory difficulty Neck: supple. JVD flat.  Cor: PMI nondisplaced. Regular rate & rhythm. No rubs, gallops or murmurs. Lungs: clear Extremities: no cyanosis, clubbing, rash, edema  Neuro: alert & oriented x 3. Moves all 4 extremities w/o difficulty. Affect pleasant.    Telemetry   NSR 60s (Personally reviewed)    EKG    Last today with NRS 62 bpm, incomplete RBBB, LVH  Labs   Basic Metabolic Panel: Recent Labs  Lab 02/01/24 0935 02/01/24 0949 02/01/24 1019 02/01/24 1151  NA 143 143 109* 142  K 3.1* 3.3* 2.5* 3.7  CL 109 108  --  106  CO2 20*  --   --  26  GLUCOSE 161* 165*  --  145*  BUN 15 18  --  16  CREATININE 1.30* 1.30*  --  1.45*  CALCIUM 9.2  --   --  9.1  MG  --   --   --  1.9    Liver Function Tests: Recent Labs  Lab 02/01/24 0935 02/01/24 1151  AST 28 158*  ALT 23 34  ALKPHOS 51 51  BILITOT 0.7 0.7  PROT 6.5 6.3*  ALBUMIN 3.6 3.6   No results for input(s): "LIPASE", "AMYLASE" in the last 168 hours. No results for input(s): "AMMONIA" in the last 168 hours.  CBC: Recent Labs  Lab 02/01/24 0935 02/01/24 0949 02/01/24 1019 02/01/24 1151  WBC 7.2  --   --  18.1*  NEUTROABS 4.2  --   --   --   HGB 15.2 15.0 15.0 15.2  HCT 45.0 44.0 44.0 46.0  MCV 88.8  --   --  90.0  PLT 142*  --   --  160    Cardiac Enzymes: No results for input(s): "CKTOTAL", "CKMB", "CKMBINDEX", "TROPONINI" in the last 168 hours.  BNP: BNP (last 3 results) No results for input(s): "BNP" in the last 8760 hours.  ProBNP (last 3 results) No results for input(s): "PROBNP" in the last 8760 hours.   CBG: No results for input(s): "GLUCAP" in the last 168 hours.  Coagulation Studies: Recent Labs    02/01/24 0935  LABPROT 16.2*  INR 1.3*     Imaging   [place] EKG SITE RITE Result Date: 02/01/2024 If Site Rite image not attached, placement could not be confirmed due to current cardiac rhythm.  CARDIAC CATHETERIZATION Result Date: 02/01/2024   Mid LAD lesion is 25% stenosed.   Mid Cx to Dist Cx lesion is 40% stenosed.   Prox RCA to Mid RCA lesion is 70% stenosed.   Dist RCA lesion is 99% stenosed.   A drug-eluting stent was successfully placed using a SYNERGY XD 2.50X38.   A stent was successfully placed.   A drug-eluting stent was  successfully placed using a STENT SYS SYNERGY XD 3.0X38.   A drug-eluting stent was successfully placed using a STENT SYNERGY XD 3.0X32.   Post intervention, there is a 0% residual stenosis.   Post intervention, there is a 0% residual stenosis.   Recommend uninterrupted dual antiplatelet therapy  with Aspirin 81mg  daily and Prasugrel 10mg  daily for a minimum of 12 months (ACS-Class I recommendation). 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 Risk factor modification. Check Echo. May be a candidate for low risk STEMI DC tomorrow if no complications.    Medications:   Current Medications:  [START ON 02/02/2024] aspirin  81 mg Oral Daily   atorvastatin  80 mg Oral Daily   [START ON 02/02/2024] enoxaparin (LOVENOX) injection  40 mg Subcutaneous Q24H   pantoprazole  20 mg Oral Daily   potassium chloride  20 mEq Oral Once   [START ON 02/02/2024] prasugrel  10 mg Oral Daily   sodium chloride flush  3 mL Intravenous Q12H    Infusions:  sodium chloride     magnesium sulfate bolus IVPB      Patient Profile   Bernadette R Ladd is a 68 y.o. male with HTN and HLD. Admitted with STEMI now with CGS.   Assessment/Plan  Cardiogenic shock - Post perfusion CGS with wide pulse pressure - Lactic acid initially cleared in cath lab but now up trending once in unit. Last 3.8. Will continue to trend.  - Place PICC, follow co-ox/CVP - If lactic acid elevated will start milrinone - Does not need MCS at this time - Echo done at bedside, reviewed with Dr. 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 - Has some ongoing chest pressure, suspect 2/2 reperfusion  - Continue ASA and Effient.  - Continue statin. LDL 93. Goal <70  HTN - BP currently stable - Suspect  wide pulse pressure post reperfusion and stunned RV  Ascending aortic aneurysm - AAA measuring 5-5.7 cm - Will continue to follow  CRITICAL CARE Performed by: Sheryl Donna   Total critical care time: 15 minutes  Critical care time was exclusive of separately billable procedures and treating other patients.  Critical care was necessary to treat or prevent imminent or life-threatening deterioration.  Critical care was time spent personally by me on the following activities: development of treatment plan with patient and/or surrogate as well as nursing, discussions with consultants, evaluation of patient's response to treatment, examination of patient, obtaining history from patient or surrogate, ordering and performing treatments and interventions, ordering and review of laboratory studies, ordering and review of radiographic studies, pulse oximetry and re-evaluation of patient's condition.   Length of Stay: 0  Sheryl Donna, NP  02/01/2024, 2:31 PM  Advanced Heart Failure Team Pager [phone] (M-F; 7a - 5p)  Please contact [facility] for night-coverage after hours (4p -7a ) and weekends on [email or web address]

## 2024-02-01 NOTE — Progress Notes (Addendum)
 Called to bedside as patient was having severe chest pain post PCI.  This improved with 2 sublingual nitroglycerin, completely resolved.  EKG shows minor ST elevation in anterior leads, pain inferior ST elevation has resolved, no significant change compared to immediate post-cath EKG.  Continue current management.  Reviewed coronary angiogram and intervention images.  Excellent results with overlapping mid to distal RCA stents.  I came back to see him again for recurrent pain. EKG is again reassuring. He does have wide pulse pressure with BP 110s/30s. I hear systolic aortic murmur, do not appreciate diastolic murmur.  I wonder if he has severe AI and low DBP partly contributing to reduced coronary perfusion pressure.  He continues to have mild chest discomfort. 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 Cr 1.45 with recent contrast for cath. Discussed w/Dr. Bruce Caper who graciously agreed to do a stat TEE. Patient is NPO since last night. Discussed with patient and wife at bedside.   CRITICAL CARE Performed by: Fransico Ivy   Total critical care time: 30 minutes   Critical care time was exclusive of separately billable procedures and treating other patients.   Critical care was necessary to treat or prevent imminent or life-threatening deterioration.   Critical care was time spent personally by me on the following activities: development of treatment plan with patient and/or surrogate as well as nursing, discussions with consultants, evaluation of patient's response to treatment, examination of patient, obtaining history from patient or surrogate, ordering and performing treatments and interventions, ordering and review of laboratory studies, ordering and review of radiographic studies, pulse oximetry and re-evaluation of patient's condition.

## 2024-02-01 NOTE — Procedures (Addendum)
 02/01/2024 Provided 15 min sedation Prop 100mg , Fent 100mcg given Some apnea spells requiring BVM toward end of procedure Otherwise tolerated well   Ardelle Kos PCCM

## 2024-02-01 NOTE — Anesthesia Procedure Notes (Addendum)
 Procedure Name: Intubation Date/Time: 02/01/2024 5:43 PM  Performed by: Alisia Apple, CRNAPre-anesthesia Checklist: Patient identified, Emergency Drugs available, Suction available and Patient being monitored Patient Re-evaluated:Patient Re-evaluated prior to induction Oxygen Delivery Method: Circle system utilized Preoxygenation: Pre-oxygenation with 100% oxygen Induction Type: IV induction Ventilation: Mask ventilation without difficulty Laryngoscope Size: Mac and 4 Grade View: Grade II Tube type: Oral Tube size: 8.0 mm Number of attempts: 1 Airway Equipment and Method: Stylet Placement Confirmation: ETT inserted through vocal cords under direct vision, positive ETCO2 and breath sounds checked- equal and bilateral Secured at: 22 cm Tube secured with: Tape Dental Injury: Teeth and Oropharynx as per pre-operative assessment  Comments: intubated by C. Shannah Conteh, CRNA; ebbs

## 2024-02-01 NOTE — Progress Notes (Signed)
  Echocardiogram Echocardiogram Transesophageal has been performed.  Royden Corin 02/01/2024, 3:41 PM

## 2024-02-01 NOTE — Anesthesia Procedure Notes (Signed)
 Central Venous Catheter Insertion Performed by: Arvie Latus, MD, anesthesiologist Start/End4/15/2025 5:35 PM, 02/01/2024 5:45 PM Patient location: OR. Preanesthetic checklist: patient identified, IV checked, site marked, risks and benefits discussed, surgical consent, monitors and equipment checked, pre-op evaluation, timeout performed and anesthesia consent Hand hygiene performed  and maximum sterile barriers used  PA cath was placed.Swan type:thermodilution Procedure performed without using ultrasound guided technique. Attempts: 1 Patient tolerated the procedure well with no immediate complications.

## 2024-02-01 NOTE — Telephone Encounter (Signed)
 Patient Product/process development scientist completed.    The patient is insured through [employer]. Patient has Medicare and is not eligible for a copay card, but may be able to apply for patient assistance or Medicare RX Payment Plan (Patient Must reach out to their plan, if eligible for payment plan), if available.    Ran test claim for prasugrel (Effient) 10 mg and the current 30 day co-pay is $13.00.   This test claim was processed through [facility]- copay amounts may vary at other pharmacies due to pharmacy/plan contracts, or as the patient moves through the different stages of their insurance plan.     Morgan Arab, CPHT Pharmacy Technician III Certified Patient Advocate [facility] Patient Advocate Team Direct Number: [phone]  Fax: [phone]

## 2024-02-01 NOTE — Anesthesia Procedure Notes (Signed)
 Central Venous Catheter Insertion Performed by: Arvie Latus, MD, anesthesiologist Start/End4/15/2025 5:35 PM, 02/01/2024 5:45 PM Patient location: OR. Preanesthetic checklist: patient identified, IV checked, site marked, risks and benefits discussed, surgical consent, monitors and equipment checked, pre-op evaluation, timeout performed and anesthesia consent Position: supine Patient sedated Hand hygiene performed  and maximum sterile barriers used  Catheter size: 9 Fr Total catheter length 10. MAC introducer Procedure performed using ultrasound guided technique. Ultrasound Notes:anatomy identified, needle tip was noted to be adjacent to the nerve/plexus identified, no ultrasound evidence of intravascular and/or intraneural injection and image(s) printed for medical record Attempts: 1 Following insertion, line sutured and dressing applied. Post procedure assessment: blood return through all ports, free fluid flow and no air  Patient tolerated the procedure well with no immediate complications.

## 2024-02-01 NOTE — Discharge Instructions (Addendum)
 Discharge Instructions:  1. You may shower, please wash incisions daily with soap and water  and keep dry.  If you wish to cover wounds with dressing you may do so but please keep clean and change daily.  No tub baths or swimming until incisions have completely healed.  If your incisions become red or develop any drainage please call our office at [phone]  2. No Driving until cleared by Dr. Everlina Hock office and you are no longer using narcotic pain medications  3. Monitor your weight daily.. Please use the same scale and weigh at same time... If you gain 5-10 lbs in 48 hours with associated lower extremity swelling, please contact our office at [phone]  4. Fever of 101.5 for at least 24 hours with no source, please contact our office at [phone]  5. Activity- up as tolerated, please walk at least 3 times per day.  Avoid strenuous activity, no lifting, pushing, or pulling with your arms over 8-10 lbs for a minimum of 6 weeks  6. If any questions or concerns arise, please do not hesitate to contact our office at [phone]   Information about your medication: Effient  (anti-platelet agent)  Generic Name (Brand): prasugrel  (Effient ), once daily medication  PURPOSE: You are taking this medication along with aspirin  to lower your chance of having a heart attack, stroke, or blood clots in your heart stent. These can be fatal. Effient  and aspirin  help prevent platelets from sticking together and forming a clot that can block an artery or your stent.   Common SIDE EFFECTS you may experience include: bruising or bleeding more easily, shortness of breath  Do not stop taking EFFIENT  without talking to the doctor who prescribes it for you. People who are treated with a stent and stop taking Effient  too soon, have a higher risk of getting a blood clot in the stent, having a heart attack, or dying. If you stop Effient  because of bleeding, or for other reasons, your risk of a heart attack or  stroke may increase.   Avoid taking NSAID agents or anti-inflammatory medications such as ibuprofen, naproxen given increased bleed risk with Effient  - can use acetaminophen  (Tylenol ) if needed for pain.  Tell all of your doctors and dentists that you are taking Effient . They should talk to the doctor who prescribed Effient  for you before you have any surgery or invasive procedure.   Contact your health care provider if you experience: severe or uncontrollable bleeding, pink/red/brown urine, vomiting blood or vomit that looks like "coffee grounds", red or black stools (looks like tar), coughing up blood or blood clots ----------------------------------------------------------------------------------------------------------------------

## 2024-02-01 NOTE — H&P (Signed)
 Cardiology Admission History and Physical   Patient ID: Dustin Davenport MRN: [id number]; DOB: July 12, 1956   Admission date: 02/01/2024  PCP:  No primary care provider on file.   [facility] Providers Cardiologist:  None        Chief Complaint:  chest pain  Patient Profile:   Dustin Davenport is a 68 y.o. male with history of HTN, HLD who is being seen 02/01/2024 for the evaluation of inferior STEMI.  History of Present Illness:   Mr. Dustin Davenport has a history of HTN on medication. Today approximately 8 am he developed chest and upper abdominal pain associated with SOB and diaphoresis. EMS called and Ecg at the scene showed evidence of acute inferior STEMI with ST elevation in inferior leads and reciprocal ST depression in anterior leads. Given ASA and IV Fentanyl on route. Some improvement in chest pain but ongoing. No prior cardiac history.    Past Medical History:  Diagnosis Date   Hypertension     No past surgical history on file.   Medications Prior to Admission: Prior to Admission medications   Medication Sig Start Date End Date Taking? Authorizing Provider  acetaminophen (TYLENOL) 500 MG tablet Take 1,000 mg by mouth every 6 (six) hours as needed for mild pain, moderate pain, fever or headache.    [provider]  aspirin EC 81 MG tablet Take 81 mg by mouth daily.    [provider]  bismuth subsalicylate (PEPTO BISMOL) 262 MG/15ML suspension Take 30 mLs by mouth every 6 (six) hours as needed for indigestion or diarrhea or loose stools.    [provider]  chlorthalidone (HYGROTON) 25 MG tablet Take 25 mg by mouth daily.    [provider]  glucosamine-chondroitin 500-400 MG tablet Take 1 tablet by mouth daily.    [provider]  losartan (COZAAR) 50 MG tablet Take 50 mg by mouth daily.    [provider]  metoprolol succinate (TOPROL-XL) 100 MG 24 hr tablet Take 100 mg by mouth daily. Take with or immediately following  a meal.    [provider]  pantoprazole (PROTONIX) 20 MG tablet Take 1 tablet (20 mg total) by mouth daily. 12/19/15   Bethann Berkshire, MD  potassium chloride (K-DUR,KLOR-CON) 10 MEQ tablet Take 10 mEq by mouth daily.    [provider]     Allergies:    Allergies  Allergen Reactions   Bee Venom     Social History:   Social History   Socioeconomic History   Marital status: Married    Spouse name: Not on file   Number of children: Not on file   Years of education: Not on file   Highest education level: Not on file  Occupational History   Not on file  Tobacco Use   Smoking status: Not on file   Smokeless tobacco: Not on file  Substance and Sexual Activity   Alcohol use: Not on file   Drug use: Not on file   Sexual activity: Not on file  Other Topics Concern   Not on file  Social History Narrative   Not on file   Social Drivers of Health   Financial Resource Strain: Not on file  Food Insecurity: Not on file  Transportation Needs: Not on file  Physical Activity: Not on file  Stress: Not on file  Social Connections: Not on file  Intimate Partner Violence: Not on file    Family History:   The patient's family history  is negative for premature cardiac disease.   ROS:  Please see the history of present illness.  All other ROS reviewed and negative.     Physical Exam/Data:   Vitals:   02/01/24 0932 02/01/24 0934 02/01/24 0958  SpO2:  96% 90%  Weight: 120.2 kg    Height: 6\' 1"  (1.854 m)     No intake or output data in the 24 hours ending 02/01/24 1112    02/01/2024    9:32 AM  Last 3 Weights  Weight (lbs) 265 lb  Weight (kg) 120.203 kg     Body mass index is 34.96 kg/m.  General:  Well nourished, obese, in no acute distress HEENT: normal Neck: no JVD Vascular: No carotid bruits; Distal pulses 2+ bilaterally   Cardiac:  normal S1, S2; RRR; no murmur  Lungs:  clear to auscultation bilaterally, no wheezing, rhonchi or rales  Abd: soft,  nontender, no hepatomegaly  Ext: no edema Musculoskeletal:  No deformities, BUE and BLE strength normal and equal Skin: warm and dry, pale Neuro:  CNs 2-12 intact, no focal abnormalities noted Psych:  Normal affect    EKG:  The ECG that was done today was personally reviewed and demonstrates NSR with ST elevation in inferior leads. Reciprocal changes anteriorly  Relevant CV Studies: none  Laboratory Data:  High Sensitivity Troponin:  No results for input(s): "TROPONINIHS" in the last 720 hours.    ChemistryNo results for input(s): "NA", "K", "CL", "CO2", "GLUCOSE", "BUN", "CREATININE", "CALCIUM", "MG", "GFRNONAA", "GFRAA", "ANIONGAP" in the last 168 hours.  No results for input(s): "PROT", "ALBUMIN", "AST", "ALT", "ALKPHOS", "BILITOT" in the last 168 hours. Lipids No results for input(s): "CHOL", "TRIG", "HDL", "LABVLDL", "LDLCALC", "CHOLHDL" in the last 168 hours. Hematology Recent Labs  Lab 02/01/24 0935  WBC 7.2  RBC 5.07  HGB 15.2  HCT 45.0  MCV 88.8  MCH 30.0  MCHC 33.8  RDW 14.1  PLT 142*   Thyroid No results for input(s): "TSH", "FREET4" in the last 168 hours. BNPNo results for input(s): "BNP", "PROBNP" in the last 168 hours.  DDimer No results for input(s): "DDIMER" in the last 168 hours.   Radiology/Studies:  CARDIAC CATHETERIZATION Result Date: 02/01/2024   Mid LAD lesion is 25% stenosed.   Mid Cx to Dist Cx lesion is 40% stenosed.   Prox RCA to Mid RCA lesion is 70% stenosed.   Dist RCA lesion is 99% stenosed.   A drug-eluting stent was successfully placed using a SYNERGY XD 2.50X38.   A stent was successfully placed.   A drug-eluting stent was successfully placed using a STENT SYS SYNERGY XD 3.0X38.   A drug-eluting stent was successfully placed using a STENT SYNERGY XD 3.0X32.   Post intervention, there is a 0% residual stenosis.   Post intervention, there is a 0% residual stenosis.   Recommend uninterrupted dual antiplatelet therapy with Aspirin 81mg  daily and  Prasugrel 10mg  daily for a minimum of 12 months (ACS-Class I recommendation). 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 Risk factor modification. Check Echo. May be a candidate for low risk STEMI DC tomorrow if no complications.     Assessment and Plan:   Acute inferior STEMI- will proceed directly to cath lab for emergent cardiac cath/PCI.  HTN. On 3 meds at home. Will resume as tolerated HLD. Check lipid status. Will need high dose statin  Risk Assessment/Risk Scores:    TIMI Risk Score for ST  Elevation MI:   The patient's TIMI risk score is 5, which indicates a 12.4% risk of all cause mortality at 30 days.       Code Status: Full Code  Severity of Illness: The appropriate patient status for this patient is INPATIENT. Inpatient status is judged to be reasonable and necessary in order to provide the required intensity of service to ensure the patient's safety. The patient's presenting symptoms, physical exam findings, and initial radiographic and laboratory data in the context of their chronic comorbidities is felt to place them at high risk for further clinical deterioration. Furthermore, it is not anticipated that the patient will be medically stable for discharge from the hospital within 2 midnights of admission.   * I certify that at the point of admission it is my clinical judgment that the patient will require inpatient hospital care spanning beyond 2 midnights from the point of admission due to high intensity of service, high risk for further deterioration and high frequency of surveillance required.*   For questions or updates, please contact [facility] Please consult [email or web address] for contact info under     Signed, Gwendola Hornaday [place], MD  02/01/2024 11:12 AM

## 2024-02-01 NOTE — Consult Note (Signed)
 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 [place]  Dustin Davenport is an 68 y.o. male.  HPI:   The pt is a 68 year old with a hx of HTN, HLD who developed acute chest and upper abdominal pain with SOB and diaphoresis about 8 am today. EMS ECG showed acute inferior STEMI with ST elevation in inferior leads and ST depression in anterior leads. Taken directly to cath lab with chest pain ongoing. Cath showed diffuse RCA disease treated with 3 overlapping DES. He received Effient and heparin. After arrival to ICU he developed recurrent severe CP and noted to have wide pulse pressure. Echo at 3 pm had shown mild AI and TEE after chest pain and wide pulse pressure developed showed severe AI and signs of aortic dissection. CTA chest, abd, pelvis shows acute type A dissection with a 5+ cm ascending aortic aneurysm extending into arch. The dissection goes down to infrarenal aorta and extends up into innominate and left common carotid arteries. He is fairly stable on no vasopressors at this time but has wide pulse pressure with diastolic in the 30's.   Past Medical History:  Diagnosis Date   Hypertension     Past Surgical History:  Procedure Laterality Date   CORONARY/GRAFT ACUTE MI REVASCULARIZATION N/A 02/01/2024   Procedure: Coronary/Graft Acute MI Revascularization;  Surgeon: [place], Peter M, MD;  Location[facility];  Service: Cardiovascular;  Laterality: N/A;    No family history on file.  Social History:  has no history on file for tobacco use, alcohol use, and drug use.  Allergies:  Allergies  Allergen Reactions   Bee Venom     Medications: I have reviewed the patient's current medications. Prior to Admission:  Medications Prior to Admission  Medication Sig Dispense Refill  Last Dose/Taking   acetaminophen (TYLENOL) 500 MG tablet Take 1,000 mg by mouth 2 (two) times daily as needed for mild pain (pain score 1-3), moderate pain (pain score 4-6), fever or headache.   01/31/2024 Evening   chlorthalidone (HYGROTON) 25 MG tablet Take 25 mg by mouth daily.   01/31/2024 Morning   losartan (COZAAR) 100 MG tablet Take 50 mg by mouth daily.   01/31/2024 Morning   metoprolol succinate (TOPROL-XL) 100 MG 24 hr tablet Take 50 mg by mouth daily. Take with or immediately following a meal.   01/31/2024 Morning   potassium chloride (KLOR-CON) 10 MEQ tablet Take 10 mEq by mouth daily.   01/31/2024 Morning   Scheduled:  [START ON 02/02/2024] aspirin  81 mg Oral Daily   atorvastatin  80 mg Oral Daily    ceFAZolin (ANCEF) IV  3 g Intravenous To OR   [START ON 02/02/2024] enoxaparin (LOVENOX) injection  40 mg Subcutaneous Q24H   epinephrine  0-10 mcg/min Intravenous To OR   heparin sodium (porcine) 2,500 Units, papaverine 30 mg in electrolyte-A (PLASMALYTE-A PH 7.4) 500 mL irrigation   Irrigation To OR   insulin   Intravenous To OR   [facility] Blood Cardioplegia vial (lidocaine/magnesium/mannitol 0.26g-4g-6.4g)   Intracoronary To OR   mupirocin ointment  1 Application Nasal BID   pantoprazole  20 mg Oral Daily   phenylephrine  30-200 mcg/min Intravenous To OR  potassium chloride  80 mEq Other To OR   potassium chloride  20 mEq Oral Once   sodium chloride flush  3 mL Intravenous Q12H   tranexamic acid  15 mg/kg Intravenous To OR   tranexamic acid  2 mg/kg Intracatheter To OR   Continuous:  sodium chloride     sodium chloride      ceFAZolin (ANCEF) IV     dexmedetomidine     heparin 30,000 units/NS 1000 mL solution for CELLSAVER     magnesium sulfate bolus IVPB     milrinone     nitroGLYCERIN     norepinephrine (LEVOPHED) Adult infusion     norepinephrine     tranexamic acid (CYKLOKAPRON) 2,500 mg in sodium chloride 0.9 % 250 mL (10 mg/mL) infusion     vancomycin      ZOX:WRUEAV chloride, acetaminophen, nitroGLYCERIN, ondansetron (ZOFRAN) IV, sodium chloride flush Anti-infectives (From admission, onward)    Start     Dose/Rate Route Frequency Ordered Stop   02/01/24 1700  Vancomycin (VANCOCIN) 1,500 mg in sodium chloride 0.9 % 500 mL IVPB        1,500 mg 250 mL/hr over 120 Minutes Intravenous To Surgery 02/01/24 1650 02/02/24 1745   02/01/24 1700  ceFAZolin (ANCEF) IVPB 2g/100 mL premix        2 g 200 mL/hr over 30 Minutes Intravenous To Surgery 02/01/24 1650 02/02/24 1745   02/01/24 1700  ceFAZolin (ANCEF) IVPB 3g/150 mL premix        3 g 300 mL/hr over 30 Minutes Intravenous To Surgery 02/01/24 1650 02/02/24 1745       Results for orders placed or performed during the hospital encounter of 02/01/24 (from the past 48 hours)  CBC with Differential/Platelet     Status: Abnormal   Collection Time: 02/01/24  9:35 AM  Result Value Ref Range   WBC 7.2 4.0 - 10.5 K/uL   RBC 5.07 4.22 - 5.81 MIL/uL   Hemoglobin 15.2 13.0 - 17.0 g/dL   HCT 40.9 81.1 - 91.4 %   MCV 88.8 80.0 - 100.0 fL   MCH 30.0 26.0 - 34.0 pg   MCHC 33.8 30.0 - 36.0 g/dL   RDW 78.2 95.6 - 21.3 %   Platelets 142 (L) 150 - 400 K/uL   nRBC 0.0 0.0 - 0.2 %   Neutrophils Relative % 58 %   Neutro Abs 4.2 1.7 - 7.7 K/uL   Lymphocytes Relative 33 %   Lymphs Abs 2.4 0.7 - 4.0 K/uL   Monocytes Relative 6 %   Monocytes Absolute 0.5 0.1 - 1.0 K/uL   Eosinophils Relative 2 %   Eosinophils Absolute 0.1 0.0 - 0.5 K/uL   Basophils Relative 1 %   Basophils Absolute 0.1 0.0 - 0.1 K/uL   Immature Granulocytes 0 %   Abs Immature Granulocytes 0.03 0.00 - 0.07 K/uL    Comment: Performed at [facility] Lab, [address], [place], [place] [address]  Protime-INR     Status: Abnormal   Collection Time: 02/01/24  9:35 AM  Result Value Ref Range   Prothrombin Time 16.2 (H) 11.4 - 15.2 seconds   INR 1.3 (H) 0.8 - 1.2    Comment: (NOTE) INR goal varies based on device and disease  states. Performed at [facility], [address], [place], [place] [address]   APTT     Status: None   Collection Time: 02/01/24  9:35 AM  Result Value Ref Range  aPTT 32 24 - 36 seconds    Comment: Performed at [facility] Lab, [address], [place], [place] [address]  Comprehensive metabolic panel     Status: Abnormal   Collection Time: 02/01/24  9:35 AM  Result Value Ref Range   Sodium 143 135 - 145 mmol/L   Potassium 3.1 (L) 3.5 - 5.1 mmol/L   Chloride 109 98 - 111 mmol/L   CO2 20 (L) 22 - 32 mmol/L   Glucose, Bld 161 (H) 70 - 99 mg/dL    Comment: Glucose reference range applies only to samples taken after fasting for at least 8 hours.   BUN 15 8 - 23 mg/dL   Creatinine, Ser 6.04 (H) 0.61 - 1.24 mg/dL   Calcium 9.2 8.9 - 54.0 mg/dL   Total Protein 6.5 6.5 - 8.1 g/dL   Albumin 3.6 3.5 - 5.0 g/dL   AST 28 15 - 41 U/L   ALT 23 0 - 44 U/L   Alkaline Phosphatase 51 38 - 126 U/L   Total Bilirubin 0.7 0.0 - 1.2 mg/dL   GFR, Estimated 60 (L) >60 mL/min    Comment: (NOTE) Calculated using the CKD-EPI Creatinine Equation (2021)    Anion gap 14 5 - 15    Comment: Performed at [facility] Lab, [address] [place], [place] [address]  Troponin I (High Sensitivity)     Status: None   Collection Time: 02/01/24  9:35 AM  Result Value Ref Range   Troponin I (High Sensitivity) 12 <18 ng/L    Comment: (NOTE) Elevated high sensitivity troponin I (hsTnI) values and significant  changes across serial measurements may suggest ACS but many other  chronic and acute conditions are known to elevate hsTnI results.  Refer to the "Links" section for chest pain algorithms and additional  guidance. Performed at [facility], [address] [place], [place] [address]   Lipid panel     Status: Abnormal   Collection Time: 02/01/24  9:35 AM  Result Value Ref Range   Cholesterol 160 0 - 200 mg/dL   Triglycerides 956 (H) <150 mg/dL   HDL 31 (L) >21 mg/dL   Total CHOL/HDL  Ratio 5.2 RATIO   VLDL 36 0 - 40 mg/dL   LDL Cholesterol 93 0 - 99 mg/dL    Comment:        Total Cholesterol/HDL:CHD Risk Coronary Heart Disease Risk Table                     Men   Women  1/2 Average Risk   3.4   3.3  Average Risk       5.0   4.4  2 X Average Risk   9.6   7.1  3 X Average Risk  23.4   11.0        Use the calculated Patient Ratio above and the CHD Risk Table to determine the patient's CHD Risk.        ATP III CLASSIFICATION (LDL):  <100     mg/dL   Optimal  308-657  mg/dL   Near or Above                    Optimal  130-159  mg/dL   Borderline  846-962  mg/dL   High  >952     mg/dL   Very High Performed at [facility], [address] [place], [place] [address]   I-STAT,  chem 8     Status: Abnormal   Collection Time: 02/01/24  9:49 AM  Result Value Ref Range   Sodium 143 135 - 145 mmol/L   Potassium 3.3 (L) 3.5 - 5.1 mmol/L   Chloride 108 98 - 111 mmol/L   BUN 18 8 - 23 mg/dL   Creatinine, Ser 4.13 (H) 0.61 - 1.24 mg/dL   Glucose, Bld 244 (H) 70 - 99 mg/dL    Comment: Glucose reference range applies only to samples taken after fasting for at least 8 hours.   Calcium, Ion 1.16 1.15 - 1.40 mmol/L   TCO2 21 (L) 22 - 32 mmol/L   Hemoglobin 15.0 13.0 - 17.0 g/dL   HCT 01.0 27.2 - 53.6 %  CG4 I-STAT (Lactic acid)     Status: Abnormal   Collection Time: 02/01/24  9:49 AM  Result Value Ref Range   Lactic Acid, Venous 4.4 (HH) 0.5 - 1.9 mmol/L   Comment NOTIFIED PHYSICIAN   Hemoglobin A1c     Status: None   Collection Time: 02/01/24 10:00 AM  Result Value Ref Range   Hgb A1c MFr Bld 5.4 4.8 - 5.6 %    Comment: (NOTE) Pre diabetes:          5.7%-6.4%  Diabetes:              >6.4%  Glycemic control for   <7.0% adults with diabetes    Mean Plasma Glucose 108.28 mg/dL    Comment: Performed at [facility] Lab, [address], [place], [place] [address]  POCT Activated clotting time     Status: None   Collection Time: 02/01/24 10:08 AM  Result  Value Ref Range   Activated Clotting Time 245 seconds    Comment: Reference range 74-137 seconds for patients not on anticoagulant therapy.  I-STAT 7, (LYTES, BLD GAS, ICA, H+H)     Status: Abnormal   Collection Time: 02/01/24 10:19 AM  Result Value Ref Range   pH, Arterial 7.101 (LL) 7.35 - 7.45   pCO2 arterial 48.8 (H) 32 - 48 mmHg   pO2, Arterial 74 (L) 83 - 108 mmHg   Bicarbonate 15.2 (L) 20.0 - 28.0 mmol/L   TCO2 17 (L) 22 - 32 mmol/L   O2 Saturation 88 %   Acid-base deficit 14.0 (H) 0.0 - 2.0 mmol/L   Sodium 109 (LL) 135 - 145 mmol/L   Potassium 2.5 (LL) 3.5 - 5.1 mmol/L   Calcium, Ion 1.00 (L) 1.15 - 1.40 mmol/L   HCT 44.0 39.0 - 52.0 %   Hemoglobin 15.0 13.0 - 17.0 g/dL   Sample type ARTERIAL    Comment NOTIFIED PHYSICIAN   CG4 I-STAT (Lactic acid)     Status: None   Collection Time: 02/01/24 10:21 AM  Result Value Ref Range   Lactic Acid, Venous 1.7 0.5 - 1.9 mmol/L  POCT Activated clotting time     Status: None   Collection Time: 02/01/24 10:32 AM  Result Value Ref Range   Activated Clotting Time 711 seconds    Comment: Reference range 74-137 seconds for patients not on anticoagulant therapy.  POCT Activated clotting time     Status: None   Collection Time: 02/01/24 10:38 AM  Result Value Ref Range   Activated Clotting Time 262 seconds    Comment: Reference range 74-137 seconds for patients not on anticoagulant therapy.  Lactic acid, plasma     Status: Abnormal   Collection Time: 02/01/24 11:51 AM  Result Value Ref  Range   Lactic Acid, Venous 3.8 (HH) 0.5 - 1.9 mmol/L    Comment: CRITICAL RESULT CALLED TO, READ BACK BY AND VERIFIED WITH Gaspar Garbe RN @1255  04.15.2025 E.AHMED Performed at [facility], [address], [place], [place] [address]   Troponin I (High Sensitivity)     Status: Abnormal   Collection Time: 02/01/24 11:51 AM  Result Value Ref Range   Troponin I (High Sensitivity) 23,506 (HH) <18 ng/L    Comment: CRITICAL RESULT CALLED TO, READ  BACK BY AND VERIFIED WITH Gaspar Garbe RN @1308  04.15.2025 E.AHMED DELTA CHECK NOTED (NOTE) Elevated high sensitivity troponin I (hsTnI) values and significant  changes across serial measurements may suggest ACS but many other  chronic and acute conditions are known to elevate hsTnI results.  Refer to the "Links" section for chest pain algorithms and additional  guidance. Performed at [facility], [address] [place], [place] [address]   HIV Antibody (routine testing w rflx)     Status: None   Collection Time: 02/01/24 11:51 AM  Result Value Ref Range   HIV Screen 4th Generation wRfx Non Reactive Non Reactive    Comment: Performed at [facility], [address] [place], [place] [address]  CBC     Status: Abnormal   Collection Time: 02/01/24 11:51 AM  Result Value Ref Range   WBC 18.1 (H) 4.0 - 10.5 K/uL   RBC 5.11 4.22 - 5.81 MIL/uL   Hemoglobin 15.2 13.0 - 17.0 g/dL   HCT 62.9 52.8 - 41.3 %   MCV 90.0 80.0 - 100.0 fL   MCH 29.7 26.0 - 34.0 pg   MCHC 33.0 30.0 - 36.0 g/dL   RDW 24.4 01.0 - 27.2 %   Platelets 160 150 - 400 K/uL   nRBC 0.0 0.0 - 0.2 %    Comment: Performed at [facility], [address] [place], [place] [address]  Comprehensive metabolic panel     Status: Abnormal   Collection Time: 02/01/24 11:51 AM  Result Value Ref Range   Sodium 142 135 - 145 mmol/L   Potassium 3.7 3.5 - 5.1 mmol/L   Chloride 106 98 - 111 mmol/L   CO2 26 22 - 32 mmol/L   Glucose, Bld 145 (H) 70 - 99 mg/dL    Comment: Glucose reference range applies only to samples taken after fasting for at least 8 hours.   BUN 16 8 - 23 mg/dL   Creatinine, Ser 4.03 (H) 0.61 - 1.24 mg/dL   Calcium 9.1 8.9 - 47.4 mg/dL   Total Protein 6.3 (L) 6.5 - 8.1 g/dL   Albumin 3.6 3.5 - 5.0 g/dL   AST 259 (H) 15 - 41 U/L   ALT 34 0 - 44 U/L   Alkaline Phosphatase 51 38 - 126 U/L   Total Bilirubin 0.7 0.0 - 1.2 mg/dL   GFR, Estimated 52 (L) >60 mL/min    Comment: (NOTE) Calculated  using the CKD-EPI Creatinine Equation (2021)    Anion gap 10 5 - 15    Comment: Performed at [facility], [address] [place], [place] [address]  Magnesium     Status: None   Collection Time: 02/01/24 11:51 AM  Result Value Ref Range   Magnesium 1.9 1.7 - 2.4 mg/dL    Comment: Performed at [facility], [address], [place], [place] [address]  Hemoglobin A1c     Status: None   Collection  Time: 02/01/24 11:51 AM  Result Value Ref Range   Hgb A1c MFr Bld 5.3 4.8 - 5.6 %    Comment: (NOTE) Pre diabetes:          5.7%-6.4%  Diabetes:              >6.4%  Glycemic control for   <7.0% adults with diabetes    Mean Plasma Glucose 105.41 mg/dL    Comment: Performed at [facility], [address], [place], [place] [address]  MRSA Next Gen by PCR, Nasal     Status: None   Collection Time: 02/01/24  1:46 PM   Specimen: Nasal Mucosa; Nasal Swab  Result Value Ref Range   MRSA by PCR Next Gen NOT DETECTED NOT DETECTED    Comment: (NOTE) The GeneXpert MRSA Assay (FDA approved for NASAL specimens only), is one component of a comprehensive MRSA colonization surveillance program. It is not intended to diagnose MRSA infection nor to guide or monitor treatment for MRSA infections. Test performance is not FDA approved in patients less than 82 years old. Performed at [facility], [address] [place], [place] [address]   Troponin I (High Sensitivity)     Status: Abnormal   Collection Time: 02/01/24  2:01 PM  Result Value Ref Range   Troponin I (High Sensitivity) >24,000 (HH) <18 ng/L    Comment: RESULTS CONFIRMED BY MANUAL DILUTION CRITICAL VALUE NOTED.  VALUE IS CONSISTENT WITH PREVIOUSLY REPORTED AND CALLED VALUE. (NOTE) Elevated high sensitivity troponin I (hsTnI) values and significant  changes across serial measurements may suggest ACS but many other  chronic and acute conditions are known to elevate hsTnI results.  Refer to the Links section for chest  pain algorithms and additional  guidance.  Due to the decreased utility in serial monitoring of hsTroponin once the value exceeds 24,000 ng/L, orders for the next 24 hours will be discontinued. Confirmation testing may be ordered. HS Troponin lab(s) may be reordered after 24 hours. Performed at [facility], [address] [place], [place] [address]   Lactic acid, plasma     Status: Abnormal   Collection Time: 02/01/24  2:01 PM  Result Value Ref Range   Lactic Acid, Venous 3.6 (HH) 0.5 - 1.9 mmol/L    Comment: CRITICAL VALUE NOTED. VALUE IS CONSISTENT WITH PREVIOUSLY REPORTED/CALLED VALUE Performed at [facility], [address], [place], [place] [address]     ECHOCARDIOGRAM COMPLETE Result Date: 02/01/2024    ECHOCARDIOGRAM REPORT   Patient Name:   HORST OSTERMILLER Rollins Date of Exam: 02/01/2024 Medical Rec #:  [id number]    Height:       73.0 in Accession #:    [id number]   Weight:       265.0 lb Date of Birth:  28-Feb-1956    BSA:          2.425 m Patient Age:    67 years     BP:           127/37 mmHg Patient Gender: M            HR:           93 bpm. Exam Location:  Inpatient Procedure: 2D Echo, Intracardiac Opacification Agent, Cardiac Doppler and Color            Doppler (Both Spectral and Color Flow Doppler were utilized during            procedure). Indications:  R07.9* Chest pain, unspecified; I25.110 Atherosclerotic heart                 disease of native coronary artery with unstable angina pectoris  History:        Patient has no prior history of Echocardiogram examinations.                 CAD; Signs/Symptoms:Chest Pain.  Sonographer:    Raynelle Callow RDCS Referring Phys: [phone] Hubert Madden [place]  Sonographer Comments: Technically difficult study due to poor echo windows and patient is obese. Image acquisition challenging due to patient body habitus. Patient supine post PCI. Could not turn. IMPRESSIONS  1. Left ventricular ejection fraction, by estimation, is 50 to 55%. The left ventricle  has low normal function. The left ventricle has no regional wall motion abnormalities.  2. Right ventricular systolic function is moderately reduced. The right ventricular size is moderately enlarged.  3. The mitral valve is normal in structure. No evidence of mitral valve regurgitation. No evidence of mitral stenosis.  4. The aortic valve is normal in structure. Aortic valve regurgitation is mild. No aortic stenosis is present.  5. Can not exclude dilated aorta (potential artifact inhibiting definitive diagonosis). There is also a linear mid aorta demarcation which seems to move with opening and closure of aortic valve. Would recommend further imaging to exclude aortic false lumen. Discussed with shock team. . Aneurysm of the ascending aorta, measuring 52 mm. There is severe dilatation of the ascending aorta, measuring 52 mm.  6. The inferior vena cava is normal in size with greater than 50% respiratory variability, suggesting right atrial pressure of 3 mmHg. FINDINGS  Left Ventricle: Left ventricular ejection fraction, by estimation, is 50 to 55%. The left ventricle has low normal function. The left ventricle has no regional wall motion abnormalities. Definity contrast agent was given IV to delineate the left ventricular endocardial borders. The left ventricular internal cavity size was normal in size. There is no left ventricular hypertrophy.  LV Wall Scoring: The mid inferoseptal segment and basal inferoseptal segment are akinetic. Right Ventricle: The right ventricular size is moderately enlarged. No increase in right ventricular wall thickness. Right ventricular systolic function is moderately reduced. Left Atrium: Left atrial size was normal in size. Right Atrium: Right atrial size was normal in size. Pericardium: There is no evidence of pericardial effusion. Mitral Valve: The mitral valve is normal in structure. No evidence of mitral valve regurgitation. No evidence of mitral valve stenosis. Tricuspid Valve:  The tricuspid valve is normal in structure. Tricuspid valve regurgitation is not demonstrated. No evidence of tricuspid stenosis. Aortic Valve: The aortic valve is normal in structure. Aortic valve regurgitation is mild. No aortic stenosis is present. Pulmonic Valve: The pulmonic valve was normal in structure. Pulmonic valve regurgitation is not visualized. No evidence of pulmonic stenosis. Aorta: Can not exclude dilated aorta (potential artifact inhibiting definitive diagonosis). There is also a linear mid aorta demarcation which seems to move with opening and closure of aortic valve. Would recommend further imaging to exclude aortic false  lumen. Discussed with shock team. The aortic root is normal in size and structure. There is severe dilatation of the ascending aorta, measuring 52 mm. There is an aneurysm involving the ascending aorta measuring 52 mm. Venous: The inferior vena cava is normal in size with greater than 50% respiratory variability, suggesting right atrial pressure of 3 mmHg. IAS/Shunts: No atrial level shunt detected by color flow Doppler.  LEFT VENTRICLE PLAX 2D LVIDd:  6.30 cm      Diastology LVIDs:         4.00 cm      LV e' medial:    7.30 cm/s LV PW:         1.10 cm      LV E/e' medial:  13.6 LV IVS:        1.20 cm      LV e' lateral:   7.78 cm/s LVOT diam:     2.50 cm      LV E/e' lateral: 12.8 LV SV:         113 LV SV Index:   47 LVOT Area:     4.91 cm  LV Volumes (MOD) LV vol d, MOD A2C: 198.0 ml LV vol d, MOD A4C: 188.0 ml LV vol s, MOD A2C: 76.4 ml LV vol s, MOD A4C: 84.4 ml LV SV MOD A2C:     121.6 ml LV SV MOD A4C:     188.0 ml LV SV MOD BP:      109.9 ml RIGHT VENTRICLE             IVC RV S prime:     22.40 cm/s  IVC diam: 2.50 cm TAPSE (M-mode): 2.2 cm LEFT ATRIUM             Index        RIGHT ATRIUM           Index LA diam:        2.80 cm 1.15 cm/m   RA Area:     22.90 cm LA Vol (A2C):   65.6 ml 27.05 ml/m  RA Volume:   68.50 ml  28.25 ml/m LA Vol (A4C):   43.5 ml 17.94  ml/m LA Biplane Vol: 54.9 ml 22.64 ml/m  AORTIC VALVE LVOT Vmax:   96.90 cm/s LVOT Vmean:  66.200 cm/s LVOT VTI:    0.230 m  AORTA Ao Root diam: 4.70 cm Ao Asc diam:  5.40 cm MITRAL VALVE MV Area (PHT): 3.26 cm    SHUNTS MV Decel Time: 233 msec    Systemic VTI:  0.23 m MV E velocity: 99.60 cm/s  Systemic Diam: 2.50 cm MV A velocity: 41.60 cm/s MV E/A ratio:  2.39 Donato Schultz MD Electronically signed by Donato Schultz MD Signature Date/Time: 02/01/2024/3:09:36 PM    Final    [place] EKG SITE RITE Result Date: 02/01/2024 If Site Rite image not attached, placement could not be confirmed due to current cardiac rhythm.  CARDIAC CATHETERIZATION Result Date: 02/01/2024   Mid LAD lesion is 25% stenosed.   Mid Cx to Dist Cx lesion is 40% stenosed.   Prox RCA to Mid RCA lesion is 70% stenosed.   Dist RCA lesion is 99% stenosed.   A drug-eluting stent was successfully placed using a SYNERGY XD 2.50X38.   A stent was successfully placed.   A drug-eluting stent was successfully placed using a STENT SYS SYNERGY XD 3.0X38.   A drug-eluting stent was successfully placed using a STENT SYNERGY XD 3.0X32.   Post intervention, there is a 0% residual stenosis.   Post intervention, there is a 0% residual stenosis.   Recommend uninterrupted dual antiplatelet therapy with Aspirin 81mg  daily and Prasugrel 10mg  daily for a minimum of 12 months (ACS-Class I recommendation). 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 Risk factor  modification. Check Echo. May be a candidate for low risk STEMI DC tomorrow if no complications.    Review of Systems  Constitutional:  Positive for fatigue. Negative for activity change.  HENT: Negative.    Eyes: Negative.   Respiratory:  Negative for shortness of breath.   Cardiovascular:  Positive for chest pain.  Gastrointestinal: Negative.   Genitourinary: Negative.    Musculoskeletal: Negative.   Neurological:  Negative for dizziness and syncope.  Hematological: Negative.   Psychiatric/Behavioral: Negative.     Blood pressure (!) 127/58, pulse 72, resp. rate 20, height 6\' 1"  (1.854 m), weight 120.2 kg, SpO2 98%. Physical Exam Constitutional:      Appearance: Normal appearance. He is obese.  HENT:     Head: Normocephalic and atraumatic.     Mouth/Throat:     Mouth: Mucous membranes are moist.     Pharynx: Oropharynx is clear.  Eyes:     Extraocular Movements: Extraocular movements intact.     Conjunctiva/sclera: Conjunctivae normal.     Pupils: Pupils are equal, round, and reactive to light.  Neck:     Vascular: No carotid bruit.  Cardiovascular:     Rate and Rhythm: Normal rate and regular rhythm.     Pulses: Normal pulses.     Heart sounds: Murmur heard.     Comments: 3/6 systolic and diastolic murmur RSB. Pulmonary:     Effort: Pulmonary effort is normal.     Breath sounds: Normal breath sounds.  Abdominal:     General: Bowel sounds are normal. There is no distension.     Palpations: Abdomen is soft.     Tenderness: There is no abdominal tenderness.  Musculoskeletal:        General: No swelling.  Skin:    General: Skin is warm and dry.  Neurological:     General: No focal deficit present.     Mental Status: He is alert and oriented to person, place, and time.  Psychiatric:        Mood and Affect: Mood normal.        Behavior: Behavior normal.     Assessment/Plan:  Acute type A aortic dissection with ascending aortic aneurysm and severe AI in setting of acute inferior STEMI treated today with extensive RCA stenting. This will require emergent surgery to include Bentall procedure with bioprosthetic valve conduit, reimplantation of coronary arteries, replacement of ascending aorta and possibly part of arch. Unfortunately he also received Effient which will significantly increase the risk of bleeding. I discussed the operative  procedure with the patient, wife and daughter including alternatives, benefits and risks; including but not limited to bleeding, blood transfusion, infection, stroke, myocardial infarction, heart block requiring a permanent pacemaker, organ dysfunction, and death.  Linton R Baratta understands and agrees to proceed.      Bartley Lightning 02/01/2024, 4:59 PM

## 2024-02-01 NOTE — Progress Notes (Signed)
  Echocardiogram 2D Echocardiogram has been performed.  Royden Corin 02/01/2024, 3:03 PM

## 2024-02-01 NOTE — Anesthesia Preprocedure Evaluation (Signed)
 Anesthesia Evaluation  Patient identified by MRN, date of birth, ID band Patient awake    Reviewed: Allergy & Precautions, NPO status , Patient's Chart, lab work & pertinent test results  History of Anesthesia Complications Negative for: history of anesthetic complications  Airway Mallampati: III  TM Distance: >3 FB Neck ROM: Full    Dental  (+) Teeth Intact, Dental Advisory Given   Pulmonary neg shortness of breath, neg sleep apnea, neg COPD, neg recent URI   breath sounds clear to auscultation       Cardiovascular hypertension, + Past MI and + Cardiac Stents   Rhythm:Regular     Neuro/Psych negative neurological ROS  negative psych ROS   GI/Hepatic negative GI ROS, Neg liver ROS,,,  Endo/Other    Renal/GU Renal InsufficiencyRenal diseaseLab Results      Component                Value               Date                      NA                       140                 02/01/2024                K                        5.0                 02/01/2024                CO2                      26                  02/01/2024                GLUCOSE                  128 (H)             02/01/2024                BUN                      25 (H)              02/01/2024                CREATININE               1.60 (H)            02/01/2024                CALCIUM                  9.1                 02/01/2024                GFRNONAA                 52 (L)              02/01/2024  Musculoskeletal   Abdominal   Peds  Hematology  (+) Blood dyscrasia Lab Results      Component                Value               Date                      WBC                      18.1 (H)            02/01/2024                HGB                      15.3                02/01/2024                HCT                      45.0                02/01/2024                MCV                      90.0                02/01/2024                 PLT                      160                 02/01/2024              Anesthesia Other Findings Type A dissection, RCA stents today in cath lab with effient load  Reproductive/Obstetrics                             Anesthesia Physical Anesthesia Plan  ASA: 5 and emergent  Anesthesia Plan: General   Post-op Pain Management:    Induction: Intravenous  PONV Risk Score and Plan: 3 and Ondansetron  Airway Management Planned: Oral ETT  Additional Equipment: Arterial line, CVP, PA Cath, TEE and Ultrasound Guidance Line Placement  Intra-op Plan:   Post-operative Plan: Post-operative intubation/ventilation  Informed Consent: I have reviewed the patients History and Physical, chart, labs and discussed the procedure including the risks, benefits and alternatives for the proposed anesthesia with the patient or authorized representative who has indicated his/her understanding and acceptance.     Dental advisory given  Plan Discussed with: CRNA  Anesthesia Plan Comments:        Anesthesia Quick Evaluation

## 2024-02-02 ENCOUNTER — Inpatient Hospital Stay (HOSPITAL_COMMUNITY)

## 2024-02-02 ENCOUNTER — Encounter (HOSPITAL_COMMUNITY): Payer: Self-pay | Admitting: Surgery

## 2024-02-02 DIAGNOSIS — Z9889 Other specified postprocedural states: Secondary | ICD-10-CM

## 2024-02-02 DIAGNOSIS — I2111 ST elevation (STEMI) myocardial infarction involving right coronary artery: Secondary | ICD-10-CM | POA: Diagnosis not present

## 2024-02-02 LAB — FIBRINOGEN
Fibrinogen: 122 mg/dL — ABNORMAL LOW (ref 210–475)
Fibrinogen: 145 mg/dL — ABNORMAL LOW (ref 210–475)
Fibrinogen: 94 mg/dL — CL (ref 210–475)

## 2024-02-02 LAB — GLUCOSE, CAPILLARY
Glucose-Capillary: 236 mg/dL — ABNORMAL HIGH (ref 70–99)
Glucose-Capillary: 264 mg/dL — ABNORMAL HIGH (ref 70–99)
Glucose-Capillary: 274 mg/dL — ABNORMAL HIGH (ref 70–99)
Glucose-Capillary: 265 mg/dL — ABNORMAL HIGH (ref 70–99)
Glucose-Capillary: 269 mg/dL — ABNORMAL HIGH (ref 70–99)
Glucose-Capillary: 233 mg/dL — ABNORMAL HIGH (ref 70–99)
Glucose-Capillary: 193 mg/dL — ABNORMAL HIGH (ref 70–99)
Glucose-Capillary: 203 mg/dL — ABNORMAL HIGH (ref 70–99)
Glucose-Capillary: 174 mg/dL — ABNORMAL HIGH (ref 70–99)
Glucose-Capillary: 153 mg/dL — ABNORMAL HIGH (ref 70–99)
Glucose-Capillary: 134 mg/dL — ABNORMAL HIGH (ref 70–99)
Glucose-Capillary: 131 mg/dL — ABNORMAL HIGH (ref 70–99)
Glucose-Capillary: 114 mg/dL — ABNORMAL HIGH (ref 70–99)
Glucose-Capillary: 130 mg/dL — ABNORMAL HIGH (ref 70–99)
Glucose-Capillary: 143 mg/dL — ABNORMAL HIGH (ref 70–99)
Glucose-Capillary: 134 mg/dL — ABNORMAL HIGH (ref 70–99)

## 2024-02-02 LAB — POCT I-STAT 7, (LYTES, BLD GAS, ICA,H+H)
Acid-Base Excess: 0 mmol/L (ref 0.0–2.0)
Acid-Base Excess: 5 mmol/L — ABNORMAL HIGH (ref 0.0–2.0)
Acid-Base Excess: 8 mmol/L — ABNORMAL HIGH (ref 0.0–2.0)
Acid-base deficit: 10 mmol/L — ABNORMAL HIGH (ref 0.0–2.0)
Acid-base deficit: 10 mmol/L — ABNORMAL HIGH (ref 0.0–2.0)
Acid-base deficit: 11 mmol/L — ABNORMAL HIGH (ref 0.0–2.0)
Acid-base deficit: 12 mmol/L — ABNORMAL HIGH (ref 0.0–2.0)
Acid-base deficit: 13 mmol/L — ABNORMAL HIGH (ref 0.0–2.0)
Acid-base deficit: 4 mmol/L — ABNORMAL HIGH (ref 0.0–2.0)
Acid-base deficit: 4 mmol/L — ABNORMAL HIGH (ref 0.0–2.0)
Acid-base deficit: 5 mmol/L — ABNORMAL HIGH (ref 0.0–2.0)
Acid-base deficit: 6 mmol/L — ABNORMAL HIGH (ref 0.0–2.0)
Acid-base deficit: 8 mmol/L — ABNORMAL HIGH (ref 0.0–2.0)
Acid-base deficit: 9 mmol/L — ABNORMAL HIGH (ref 0.0–2.0)
Acid-base deficit: 9 mmol/L — ABNORMAL HIGH (ref 0.0–2.0)
Bicarbonate: 15.2 mmol/L — ABNORMAL LOW (ref 20.0–28.0)
Bicarbonate: 16.2 mmol/L — ABNORMAL LOW (ref 20.0–28.0)
Bicarbonate: 16.7 mmol/L — ABNORMAL LOW (ref 20.0–28.0)
Bicarbonate: 16.9 mmol/L — ABNORMAL LOW (ref 20.0–28.0)
Bicarbonate: 17.9 mmol/L — ABNORMAL LOW (ref 20.0–28.0)
Bicarbonate: 18 mmol/L — ABNORMAL LOW (ref 20.0–28.0)
Bicarbonate: 18.2 mmol/L — ABNORMAL LOW (ref 20.0–28.0)
Bicarbonate: 20.1 mmol/L (ref 20.0–28.0)
Bicarbonate: 20.4 mmol/L (ref 20.0–28.0)
Bicarbonate: 21.3 mmol/L (ref 20.0–28.0)
Bicarbonate: 21.9 mmol/L (ref 20.0–28.0)
Bicarbonate: 22.3 mmol/L (ref 20.0–28.0)
Bicarbonate: 25.6 mmol/L (ref 20.0–28.0)
Bicarbonate: 29 mmol/L — ABNORMAL HIGH (ref 20.0–28.0)
Bicarbonate: 31.4 mmol/L — ABNORMAL HIGH (ref 20.0–28.0)
Calcium, Ion: 0.63 mmol/L — CL (ref 1.15–1.40)
Calcium, Ion: 0.83 mmol/L — CL (ref 1.15–1.40)
Calcium, Ion: 0.88 mmol/L — CL (ref 1.15–1.40)
Calcium, Ion: 0.89 mmol/L — CL (ref 1.15–1.40)
Calcium, Ion: 0.99 mmol/L — ABNORMAL LOW (ref 1.15–1.40)
Calcium, Ion: 1.02 mmol/L — ABNORMAL LOW (ref 1.15–1.40)
Calcium, Ion: 1.05 mmol/L — ABNORMAL LOW (ref 1.15–1.40)
Calcium, Ion: 1.07 mmol/L — ABNORMAL LOW (ref 1.15–1.40)
Calcium, Ion: 1.09 mmol/L — ABNORMAL LOW (ref 1.15–1.40)
Calcium, Ion: 1.11 mmol/L — ABNORMAL LOW (ref 1.15–1.40)
Calcium, Ion: 1.14 mmol/L — ABNORMAL LOW (ref 1.15–1.40)
Calcium, Ion: 1.14 mmol/L — ABNORMAL LOW (ref 1.15–1.40)
Calcium, Ion: 1.14 mmol/L — ABNORMAL LOW (ref 1.15–1.40)
Calcium, Ion: 1.15 mmol/L (ref 1.15–1.40)
Calcium, Ion: 1.18 mmol/L (ref 1.15–1.40)
HCT: 18 % — ABNORMAL LOW (ref 39.0–52.0)
HCT: 20 % — ABNORMAL LOW (ref 39.0–52.0)
HCT: 20 % — ABNORMAL LOW (ref 39.0–52.0)
HCT: 21 % — ABNORMAL LOW (ref 39.0–52.0)
HCT: 21 % — ABNORMAL LOW (ref 39.0–52.0)
HCT: 21 % — ABNORMAL LOW (ref 39.0–52.0)
HCT: 21 % — ABNORMAL LOW (ref 39.0–52.0)
HCT: 22 % — ABNORMAL LOW (ref 39.0–52.0)
HCT: 24 % — ABNORMAL LOW (ref 39.0–52.0)
HCT: 24 % — ABNORMAL LOW (ref 39.0–52.0)
HCT: 26 % — ABNORMAL LOW (ref 39.0–52.0)
HCT: 26 % — ABNORMAL LOW (ref 39.0–52.0)
HCT: 26 % — ABNORMAL LOW (ref 39.0–52.0)
HCT: 27 % — ABNORMAL LOW (ref 39.0–52.0)
HCT: 29 % — ABNORMAL LOW (ref 39.0–52.0)
Hemoglobin: 6.1 g/dL — CL (ref 13.0–17.0)
Hemoglobin: 6.8 g/dL — CL (ref 13.0–17.0)
Hemoglobin: 6.8 g/dL — CL (ref 13.0–17.0)
Hemoglobin: 7.1 g/dL — ABNORMAL LOW (ref 13.0–17.0)
Hemoglobin: 7.1 g/dL — ABNORMAL LOW (ref 13.0–17.0)
Hemoglobin: 7.1 g/dL — ABNORMAL LOW (ref 13.0–17.0)
Hemoglobin: 7.1 g/dL — ABNORMAL LOW (ref 13.0–17.0)
Hemoglobin: 7.5 g/dL — ABNORMAL LOW (ref 13.0–17.0)
Hemoglobin: 8.2 g/dL — ABNORMAL LOW (ref 13.0–17.0)
Hemoglobin: 8.2 g/dL — ABNORMAL LOW (ref 13.0–17.0)
Hemoglobin: 8.8 g/dL — ABNORMAL LOW (ref 13.0–17.0)
Hemoglobin: 8.8 g/dL — ABNORMAL LOW (ref 13.0–17.0)
Hemoglobin: 8.8 g/dL — ABNORMAL LOW (ref 13.0–17.0)
Hemoglobin: 9.2 g/dL — ABNORMAL LOW (ref 13.0–17.0)
Hemoglobin: 9.9 g/dL — ABNORMAL LOW (ref 13.0–17.0)
O2 Saturation: 100 %
O2 Saturation: 100 %
O2 Saturation: 100 %
O2 Saturation: 100 %
O2 Saturation: 100 %
O2 Saturation: 88 %
O2 Saturation: 91 %
O2 Saturation: 93 %
O2 Saturation: 94 %
O2 Saturation: 94 %
O2 Saturation: 94 %
O2 Saturation: 96 %
O2 Saturation: 99 %
O2 Saturation: 99 %
O2 Saturation: 99 %
Patient temperature: 35.3
Patient temperature: 35.8
Patient temperature: 36.5
Patient temperature: 37.8
Patient temperature: 38.3
Patient temperature: 38
Potassium: 2.6 mmol/L — CL (ref 3.5–5.1)
Potassium: 2.7 mmol/L — CL (ref 3.5–5.1)
Potassium: 2.8 mmol/L — ABNORMAL LOW (ref 3.5–5.1)
Potassium: 2.8 mmol/L — ABNORMAL LOW (ref 3.5–5.1)
Potassium: 3.1 mmol/L — ABNORMAL LOW (ref 3.5–5.1)
Potassium: 3.1 mmol/L — ABNORMAL LOW (ref 3.5–5.1)
Potassium: 3.2 mmol/L — ABNORMAL LOW (ref 3.5–5.1)
Potassium: 3.2 mmol/L — ABNORMAL LOW (ref 3.5–5.1)
Potassium: 3.2 mmol/L — ABNORMAL LOW (ref 3.5–5.1)
Potassium: 3.4 mmol/L — ABNORMAL LOW (ref 3.5–5.1)
Potassium: 3.5 mmol/L (ref 3.5–5.1)
Potassium: 3.5 mmol/L (ref 3.5–5.1)
Potassium: 3.6 mmol/L (ref 3.5–5.1)
Potassium: 3.9 mmol/L (ref 3.5–5.1)
Potassium: 5.4 mmol/L — ABNORMAL HIGH (ref 3.5–5.1)
Sodium: 144 mmol/L (ref 135–145)
Sodium: 146 mmol/L — ABNORMAL HIGH (ref 135–145)
Sodium: 148 mmol/L — ABNORMAL HIGH (ref 135–145)
Sodium: 148 mmol/L — ABNORMAL HIGH (ref 135–145)
Sodium: 148 mmol/L — ABNORMAL HIGH (ref 135–145)
Sodium: 149 mmol/L — ABNORMAL HIGH (ref 135–145)
Sodium: 149 mmol/L — ABNORMAL HIGH (ref 135–145)
Sodium: 149 mmol/L — ABNORMAL HIGH (ref 135–145)
Sodium: 150 mmol/L — ABNORMAL HIGH (ref 135–145)
Sodium: 150 mmol/L — ABNORMAL HIGH (ref 135–145)
Sodium: 151 mmol/L — ABNORMAL HIGH (ref 135–145)
Sodium: 151 mmol/L — ABNORMAL HIGH (ref 135–145)
Sodium: 152 mmol/L — ABNORMAL HIGH (ref 135–145)
Sodium: 153 mmol/L — ABNORMAL HIGH (ref 135–145)
Sodium: 153 mmol/L — ABNORMAL HIGH (ref 135–145)
TCO2: 16 mmol/L — ABNORMAL LOW (ref 22–32)
TCO2: 18 mmol/L — ABNORMAL LOW (ref 22–32)
TCO2: 18 mmol/L — ABNORMAL LOW (ref 22–32)
TCO2: 18 mmol/L — ABNORMAL LOW (ref 22–32)
TCO2: 19 mmol/L — ABNORMAL LOW (ref 22–32)
TCO2: 20 mmol/L — ABNORMAL LOW (ref 22–32)
TCO2: 20 mmol/L — ABNORMAL LOW (ref 22–32)
TCO2: 21 mmol/L — ABNORMAL LOW (ref 22–32)
TCO2: 22 mmol/L (ref 22–32)
TCO2: 22 mmol/L (ref 22–32)
TCO2: 23 mmol/L (ref 22–32)
TCO2: 24 mmol/L (ref 22–32)
TCO2: 27 mmol/L (ref 22–32)
TCO2: 30 mmol/L (ref 22–32)
TCO2: 33 mmol/L — ABNORMAL HIGH (ref 22–32)
pCO2 arterial: 37.4 mmHg (ref 32–48)
pCO2 arterial: 41.3 mmHg (ref 32–48)
pCO2 arterial: 42.1 mmHg (ref 32–48)
pCO2 arterial: 43.4 mmHg (ref 32–48)
pCO2 arterial: 44.1 mmHg (ref 32–48)
pCO2 arterial: 44.5 mmHg (ref 32–48)
pCO2 arterial: 46 mmHg (ref 32–48)
pCO2 arterial: 46.1 mmHg (ref 32–48)
pCO2 arterial: 46.2 mmHg (ref 32–48)
pCO2 arterial: 46.9 mmHg (ref 32–48)
pCO2 arterial: 47.1 mmHg (ref 32–48)
pCO2 arterial: 47.4 mmHg (ref 32–48)
pCO2 arterial: 48.1 mmHg — ABNORMAL HIGH (ref 32–48)
pCO2 arterial: 50.6 mmHg — ABNORMAL HIGH (ref 32–48)
pCO2 arterial: 52.6 mmHg — ABNORMAL HIGH (ref 32–48)
pH, Arterial: 7.141 — CL (ref 7.35–7.45)
pH, Arterial: 7.154 — CL (ref 7.35–7.45)
pH, Arterial: 7.166 — CL (ref 7.35–7.45)
pH, Arterial: 7.167 — CL (ref 7.35–7.45)
pH, Arterial: 7.188 — CL (ref 7.35–7.45)
pH, Arterial: 7.191 — CL (ref 7.35–7.45)
pH, Arterial: 7.202 — ABNORMAL LOW (ref 7.35–7.45)
pH, Arterial: 7.205 — ABNORMAL LOW (ref 7.35–7.45)
pH, Arterial: 7.267 — ABNORMAL LOW (ref 7.35–7.45)
pH, Arterial: 7.271 — ABNORMAL LOW (ref 7.35–7.45)
pH, Arterial: 7.273 — ABNORMAL LOW (ref 7.35–7.45)
pH, Arterial: 7.348 — ABNORMAL LOW (ref 7.35–7.45)
pH, Arterial: 7.364 (ref 7.35–7.45)
pH, Arterial: 7.431 (ref 7.35–7.45)
pH, Arterial: 7.493 — ABNORMAL HIGH (ref 7.35–7.45)
pO2, Arterial: 104 mmHg (ref 83–108)
pO2, Arterial: 156 mmHg — ABNORMAL HIGH (ref 83–108)
pO2, Arterial: 180 mmHg — ABNORMAL HIGH (ref 83–108)
pO2, Arterial: 204 mmHg — ABNORMAL HIGH (ref 83–108)
pO2, Arterial: 250 mmHg — ABNORMAL HIGH (ref 83–108)
pO2, Arterial: 291 mmHg — ABNORMAL HIGH (ref 83–108)
pO2, Arterial: 307 mmHg — ABNORMAL HIGH (ref 83–108)
pO2, Arterial: 324 mmHg — ABNORMAL HIGH (ref 83–108)
pO2, Arterial: 370 mmHg — ABNORMAL HIGH (ref 83–108)
pO2, Arterial: 62 mmHg — ABNORMAL LOW (ref 83–108)
pO2, Arterial: 67 mmHg — ABNORMAL LOW (ref 83–108)
pO2, Arterial: 68 mmHg — ABNORMAL LOW (ref 83–108)
pO2, Arterial: 72 mmHg — ABNORMAL LOW (ref 83–108)
pO2, Arterial: 74 mmHg — ABNORMAL LOW (ref 83–108)
pO2, Arterial: 79 mmHg — ABNORMAL LOW (ref 83–108)

## 2024-02-02 LAB — CBC
HCT: 27.1 % — ABNORMAL LOW (ref 39.0–52.0)
HCT: 26.5 % — ABNORMAL LOW (ref 39.0–52.0)
HCT: 29.4 % — ABNORMAL LOW (ref 39.0–52.0)
HCT: 23 % — ABNORMAL LOW (ref 39.0–52.0)
HCT: 23.8 % — ABNORMAL LOW (ref 39.0–52.0)
Hemoglobin: 8.7 g/dL — ABNORMAL LOW (ref 13.0–17.0)
Hemoglobin: 8.7 g/dL — ABNORMAL LOW (ref 13.0–17.0)
Hemoglobin: 9.4 g/dL — ABNORMAL LOW (ref 13.0–17.0)
Hemoglobin: 7.8 g/dL — ABNORMAL LOW (ref 13.0–17.0)
Hemoglobin: 8.4 g/dL — ABNORMAL LOW (ref 13.0–17.0)
MCH: 31.8 pg (ref 26.0–34.0)
MCH: 30.7 pg (ref 26.0–34.0)
MCH: 28 pg (ref 26.0–34.0)
MCH: 28.4 pg (ref 26.0–34.0)
MCH: 28.5 pg (ref 26.0–34.0)
MCHC: 32.1 g/dL (ref 30.0–36.0)
MCHC: 32.8 g/dL (ref 30.0–36.0)
MCHC: 32 g/dL (ref 30.0–36.0)
MCHC: 33.9 g/dL (ref 30.0–36.0)
MCHC: 35.3 g/dL (ref 30.0–36.0)
MCV: 98.9 fL (ref 80.0–100.0)
MCV: 93.6 fL (ref 80.0–100.0)
MCV: 87.5 fL (ref 80.0–100.0)
MCV: 83.6 fL (ref 80.0–100.0)
MCV: 80.7 fL (ref 80.0–100.0)
Platelets: 103 10*3/uL — ABNORMAL LOW (ref 150–400)
Platelets: 177 10*3/uL (ref 150–400)
Platelets: 101 10*3/uL — ABNORMAL LOW (ref 150–400)
Platelets: 92 10*3/uL — ABNORMAL LOW (ref 150–400)
Platelets: 85 10*3/uL — ABNORMAL LOW (ref 150–400)
RBC: 2.74 MIL/uL — ABNORMAL LOW (ref 4.22–5.81)
RBC: 2.83 MIL/uL — ABNORMAL LOW (ref 4.22–5.81)
RBC: 3.36 MIL/uL — ABNORMAL LOW (ref 4.22–5.81)
RBC: 2.75 MIL/uL — ABNORMAL LOW (ref 4.22–5.81)
RBC: 2.95 MIL/uL — ABNORMAL LOW (ref 4.22–5.81)
RDW: 14.9 % (ref 11.5–15.5)
RDW: 15.4 % (ref 11.5–15.5)
RDW: 17.5 % — ABNORMAL HIGH (ref 11.5–15.5)
RDW: 17.9 % — ABNORMAL HIGH (ref 11.5–15.5)
RDW: 17.3 % — ABNORMAL HIGH (ref 11.5–15.5)
WBC: 15 10*3/uL — ABNORMAL HIGH (ref 4.0–10.5)
WBC: 14 10*3/uL — ABNORMAL HIGH (ref 4.0–10.5)
WBC: 12.7 10*3/uL — ABNORMAL HIGH (ref 4.0–10.5)
WBC: 12.7 10*3/uL — ABNORMAL HIGH (ref 4.0–10.5)
WBC: 16.1 10*3/uL — ABNORMAL HIGH (ref 4.0–10.5)
nRBC: 0 % (ref 0.0–0.2)
nRBC: 0 % (ref 0.0–0.2)
nRBC: 0 % (ref 0.0–0.2)
nRBC: 0 % (ref 0.0–0.2)
nRBC: 0 % (ref 0.0–0.2)

## 2024-02-02 LAB — POCT I-STAT, CHEM 8
BUN: 16 mg/dL (ref 8–23)
BUN: 18 mg/dL (ref 8–23)
BUN: 19 mg/dL (ref 8–23)
BUN: 20 mg/dL (ref 8–23)
BUN: 21 mg/dL (ref 8–23)
BUN: 22 mg/dL (ref 8–23)
Calcium, Ion: 0.82 mmol/L — CL (ref 1.15–1.40)
Calcium, Ion: 0.87 mmol/L — CL (ref 1.15–1.40)
Calcium, Ion: 1.03 mmol/L — ABNORMAL LOW (ref 1.15–1.40)
Calcium, Ion: 1.04 mmol/L — ABNORMAL LOW (ref 1.15–1.40)
Calcium, Ion: 1.09 mmol/L — ABNORMAL LOW (ref 1.15–1.40)
Calcium, Ion: 1.15 mmol/L (ref 1.15–1.40)
Chloride: 102 mmol/L (ref 98–111)
Chloride: 103 mmol/L (ref 98–111)
Chloride: 104 mmol/L (ref 98–111)
Chloride: 107 mmol/L (ref 98–111)
Chloride: 108 mmol/L (ref 98–111)
Chloride: 108 mmol/L (ref 98–111)
Creatinine, Ser: 1.4 mg/dL — ABNORMAL HIGH (ref 0.61–1.24)
Creatinine, Ser: 1.4 mg/dL — ABNORMAL HIGH (ref 0.61–1.24)
Creatinine, Ser: 1.4 mg/dL — ABNORMAL HIGH (ref 0.61–1.24)
Creatinine, Ser: 1.4 mg/dL — ABNORMAL HIGH (ref 0.61–1.24)
Creatinine, Ser: 1.4 mg/dL — ABNORMAL HIGH (ref 0.61–1.24)
Creatinine, Ser: 1.5 mg/dL — ABNORMAL HIGH (ref 0.61–1.24)
Glucose, Bld: 168 mg/dL — ABNORMAL HIGH (ref 70–99)
Glucose, Bld: 208 mg/dL — ABNORMAL HIGH (ref 70–99)
Glucose, Bld: 222 mg/dL — ABNORMAL HIGH (ref 70–99)
Glucose, Bld: 230 mg/dL — ABNORMAL HIGH (ref 70–99)
Glucose, Bld: 255 mg/dL — ABNORMAL HIGH (ref 70–99)
Glucose, Bld: 278 mg/dL — ABNORMAL HIGH (ref 70–99)
HCT: 20 % — ABNORMAL LOW (ref 39.0–52.0)
HCT: 22 % — ABNORMAL LOW (ref 39.0–52.0)
HCT: 24 % — ABNORMAL LOW (ref 39.0–52.0)
HCT: 26 % — ABNORMAL LOW (ref 39.0–52.0)
HCT: 27 % — ABNORMAL LOW (ref 39.0–52.0)
HCT: 31 % — ABNORMAL LOW (ref 39.0–52.0)
Hemoglobin: 10.5 g/dL — ABNORMAL LOW (ref 13.0–17.0)
Hemoglobin: 6.8 g/dL — CL (ref 13.0–17.0)
Hemoglobin: 7.5 g/dL — ABNORMAL LOW (ref 13.0–17.0)
Hemoglobin: 8.2 g/dL — ABNORMAL LOW (ref 13.0–17.0)
Hemoglobin: 8.8 g/dL — ABNORMAL LOW (ref 13.0–17.0)
Hemoglobin: 9.2 g/dL — ABNORMAL LOW (ref 13.0–17.0)
Potassium: 3.1 mmol/L — ABNORMAL LOW (ref 3.5–5.1)
Potassium: 3.1 mmol/L — ABNORMAL LOW (ref 3.5–5.1)
Potassium: 3.3 mmol/L — ABNORMAL LOW (ref 3.5–5.1)
Potassium: 3.5 mmol/L (ref 3.5–5.1)
Potassium: 3.8 mmol/L (ref 3.5–5.1)
Potassium: 5 mmol/L (ref 3.5–5.1)
Sodium: 144 mmol/L (ref 135–145)
Sodium: 145 mmol/L (ref 135–145)
Sodium: 148 mmol/L — ABNORMAL HIGH (ref 135–145)
Sodium: 150 mmol/L — ABNORMAL HIGH (ref 135–145)
Sodium: 150 mmol/L — ABNORMAL HIGH (ref 135–145)
Sodium: 152 mmol/L — ABNORMAL HIGH (ref 135–145)
TCO2: 17 mmol/L — ABNORMAL LOW (ref 22–32)
TCO2: 18 mmol/L — ABNORMAL LOW (ref 22–32)
TCO2: 18 mmol/L — ABNORMAL LOW (ref 22–32)
TCO2: 19 mmol/L — ABNORMAL LOW (ref 22–32)
TCO2: 20 mmol/L — ABNORMAL LOW (ref 22–32)
TCO2: 23 mmol/L (ref 22–32)

## 2024-02-02 LAB — BASIC METABOLIC PANEL WITH GFR
Anion gap: 19 — ABNORMAL HIGH (ref 5–15)
Anion gap: 15 (ref 5–15)
BUN: 21 mg/dL (ref 8–23)
BUN: 23 mg/dL (ref 8–23)
CO2: 25 mmol/L (ref 22–32)
CO2: 30 mmol/L (ref 22–32)
Calcium: 8.4 mg/dL — ABNORMAL LOW (ref 8.9–10.3)
Calcium: 8.3 mg/dL — ABNORMAL LOW (ref 8.9–10.3)
Chloride: 105 mmol/L (ref 98–111)
Chloride: 103 mmol/L (ref 98–111)
Creatinine, Ser: 2.16 mg/dL — ABNORMAL HIGH (ref 0.61–1.24)
Creatinine, Ser: 2.26 mg/dL — ABNORMAL HIGH (ref 0.61–1.24)
GFR, Estimated: 33 mL/min — ABNORMAL LOW (ref >60)
GFR, Estimated: 31 mL/min — ABNORMAL LOW (ref >60)
Glucose, Bld: 234 mg/dL — ABNORMAL HIGH (ref 70–99)
Glucose, Bld: 125 mg/dL — ABNORMAL HIGH (ref 70–99)
Potassium: 3.3 mmol/L — ABNORMAL LOW (ref 3.5–5.1)
Potassium: 3.5 mmol/L (ref 3.5–5.1)
Sodium: 149 mmol/L — ABNORMAL HIGH (ref 135–145)
Sodium: 148 mmol/L — ABNORMAL HIGH (ref 135–145)

## 2024-02-02 LAB — PREPARE FRESH FROZEN PLASMA
Unit division: 0
Unit division: 0

## 2024-02-02 LAB — PREPARE PLATELET PHERESIS
Unit division: 0
Unit division: 0

## 2024-02-02 LAB — COOXEMETRY PANEL
Carboxyhemoglobin: 1.4 % (ref 0.5–1.5)
Methemoglobin: <0.7 % (ref 0.0–1.5)
O2 Saturation: 55.6 %
Total hemoglobin: 8 g/dL — ABNORMAL LOW (ref 12.0–16.0)

## 2024-02-02 LAB — BPAM FFP
Blood Product Expiration Date: 202504202359
Blood Product Expiration Date: 202504202359
ISSUE DATE / TIME: 202504152257
ISSUE DATE / TIME: 202504152257
Unit Type and Rh: 6200
Unit Type and Rh: 6200

## 2024-02-02 LAB — HEMOGLOBIN AND HEMATOCRIT, BLOOD
HCT: 25.5 % — ABNORMAL LOW (ref 39.0–52.0)
Hemoglobin: 8.4 g/dL — ABNORMAL LOW (ref 13.0–17.0)

## 2024-02-02 LAB — PROTIME-INR
INR: 1.5 — ABNORMAL HIGH (ref 0.8–1.2)
INR: 1.3 — ABNORMAL HIGH (ref 0.8–1.2)
INR: 1.4 — ABNORMAL HIGH (ref 0.8–1.2)
Prothrombin Time: 18.1 s — ABNORMAL HIGH (ref 11.4–15.2)
Prothrombin Time: 16.3 s — ABNORMAL HIGH (ref 11.4–15.2)
Prothrombin Time: 17.5 s — ABNORMAL HIGH (ref 11.4–15.2)

## 2024-02-02 LAB — APTT: aPTT: 59 s — ABNORMAL HIGH (ref 24–36)

## 2024-02-02 LAB — PREPARE RBC (CROSSMATCH)

## 2024-02-02 LAB — PLATELET COUNT: Platelets: 62 10*3/uL — ABNORMAL LOW (ref 150–400)

## 2024-02-02 LAB — BPAM PLATELET PHERESIS
Blood Product Expiration Date: 202504172359
Blood Product Expiration Date: 202504172359
ISSUE DATE / TIME: 202504152029
ISSUE DATE / TIME: 202504152029
Unit Type and Rh: 5100
Unit Type and Rh: 7300

## 2024-02-02 LAB — SURGICAL PCR SCREEN
MRSA, PCR: NEGATIVE
Staphylococcus aureus: POSITIVE — AB

## 2024-02-02 LAB — MAGNESIUM
Magnesium: 2.7 mg/dL — ABNORMAL HIGH (ref 1.7–2.4)
Magnesium: 2.4 mg/dL (ref 1.7–2.4)

## 2024-02-02 MED ORDER — AMIODARONE HCL IN DEXTROSE 360-4.14 MG/200ML-% IV SOLN
INTRAVENOUS | Status: DC | PRN
  Administered 2024-02-02: 60 mg/h via INTRAVENOUS

## 2024-02-02 MED ORDER — MIDAZOLAM HCL 2 MG/2ML IJ SOLN
INTRAMUSCULAR | Status: AC
  Filled 2024-02-02: qty 2

## 2024-02-02 MED ORDER — VASOPRESSIN 20 UNIT/ML IV SOLN
INTRAVENOUS | Status: AC
  Filled 2024-02-02: qty 1

## 2024-02-02 MED ORDER — CHLORHEXIDINE GLUCONATE 0.12 % MT SOLN
15.0000 mL | Freq: Once | OROMUCOSAL | Status: AC
  Administered 2024-02-02: 15 mL via OROMUCOSAL
  Filled 2024-02-02: qty 15

## 2024-02-02 MED ORDER — ACETAMINOPHEN 160 MG/5ML PO SOLN
1000.0000 mg | Freq: Four times a day (QID) | ORAL | Status: DC
  Administered 2024-02-02 – 2024-02-04 (×6): 1000 mg
  Filled 2024-02-02 (×6): qty 40.6

## 2024-02-02 MED ORDER — COAGULATION FACTOR VIIA RECOMB 1 MG IV SOLR
45.0000 ug/kg | Freq: Once | INTRAVENOUS | Status: AC
  Administered 2024-02-02: 5000 ug via INTRAVENOUS
  Filled 2024-02-02: qty 4

## 2024-02-02 MED ORDER — CALCIUM CHLORIDE 10 % IV SOLN
INTRAVENOUS | Status: AC
Start: 2024-02-02 — End: ?
  Filled 2024-02-02: qty 10

## 2024-02-02 MED ORDER — SODIUM CHLORIDE (PF) 0.9 % IJ SOLN
INTRAMUSCULAR | Status: AC
  Filled 2024-02-02: qty 10

## 2024-02-02 MED ORDER — AMIODARONE IV BOLUS ONLY 150 MG/100ML
INTRAVENOUS | Status: DC | PRN
  Administered 2024-02-02: 75 mg via INTRAVENOUS

## 2024-02-02 MED ORDER — VASOPRESSIN 20 UNITS/100 ML INFUSION FOR SHOCK
0.0000 [IU]/min | INTRAVENOUS | Status: DC
  Administered 2024-02-02 – 2024-02-04 (×8): 0.04 [IU]/min via INTRAVENOUS
  Administered 2024-02-05: 0.01 [IU]/min via INTRAVENOUS
  Administered 2024-02-05: 0.04 [IU]/min via INTRAVENOUS
  Filled 2024-02-02 (×11): qty 100

## 2024-02-02 MED ORDER — DEXMEDETOMIDINE HCL IN NACL 400 MCG/100ML IV SOLN
0.0000 ug/kg/h | INTRAVENOUS | Status: DC
  Administered 2024-02-02 – 2024-02-04 (×10): 0.7 ug/kg/h via INTRAVENOUS
  Administered 2024-02-04: 0.6 ug/kg/h via INTRAVENOUS
  Administered 2024-02-04: 1.2 ug/kg/h via INTRAVENOUS
  Filled 2024-02-02 (×4): qty 100
  Filled 2024-02-02: qty 200
  Filled 2024-02-02 (×7): qty 100

## 2024-02-02 MED ORDER — SODIUM CHLORIDE 0.9% IV SOLUTION: Freq: Once | INTRAVENOUS | Status: DC

## 2024-02-02 MED ORDER — ALBUMIN HUMAN 5 % IV SOLN
250.0000 mL | INTRAVENOUS | Status: DC | PRN
  Administered 2024-02-02 (×4): 12.5 g via INTRAVENOUS

## 2024-02-02 MED ORDER — POTASSIUM CHLORIDE 10 MEQ/50ML IV SOLN
10.0000 meq | INTRAVENOUS | Status: AC
  Administered 2024-02-02 (×2): 10 meq via INTRAVENOUS
  Filled 2024-02-02 (×2): qty 50

## 2024-02-02 MED ORDER — STERILE WATER FOR INJECTION IV SOLN
Freq: Once | INTRAVENOUS | Status: AC
  Administered 2024-02-02: 100 mL/h via INTRAVENOUS
  Filled 2024-02-02: qty 1000

## 2024-02-02 MED ORDER — SODIUM CHLORIDE 0.9 % IV SOLN
20.0000 ug | Freq: Once | INTRAVENOUS | Status: AC
  Administered 2024-02-02: 20 ug via INTRAVENOUS
  Filled 2024-02-02: qty 5

## 2024-02-02 MED ORDER — SODIUM CHLORIDE 0.9% FLUSH: 3.0000 mL | INTRAVENOUS | Status: DC | PRN

## 2024-02-02 MED ORDER — CHLORHEXIDINE GLUCONATE 0.12 % MT SOLN: 15.0000 mL | OROMUCOSAL | Status: AC

## 2024-02-02 MED ORDER — POTASSIUM CHLORIDE 10 MEQ/50ML IV SOLN
10.0000 meq | INTRAVENOUS | Status: AC
  Administered 2024-02-02 (×3): 10 meq via INTRAVENOUS

## 2024-02-02 MED ORDER — MIDAZOLAM HCL 2 MG/2ML IJ SOLN
2.0000 mg | INTRAMUSCULAR | Status: DC | PRN
  Administered 2024-02-02 – 2024-02-04 (×18): 2 mg via INTRAVENOUS
  Filled 2024-02-02 (×19): qty 2

## 2024-02-02 MED ORDER — MORPHINE SULFATE (PF) 2 MG/ML IV SOLN
1.0000 mg | INTRAVENOUS | Status: DC | PRN
  Administered 2024-02-02: 4 mg via INTRAVENOUS
  Filled 2024-02-02: qty 2

## 2024-02-02 MED ORDER — LACTATED RINGERS IV SOLN: INTRAVENOUS | Status: AC

## 2024-02-02 MED ORDER — SODIUM CHLORIDE 0.45 % IV SOLN: INTRAVENOUS | Status: AC | PRN

## 2024-02-02 MED ORDER — PANTOPRAZOLE SODIUM 40 MG PO TBEC: 40.0000 mg | DELAYED_RELEASE_TABLET | Freq: Every day | ORAL | Status: DC

## 2024-02-02 MED ORDER — METOPROLOL TARTRATE 12.5 MG HALF TABLET: 12.5000 mg | ORAL_TABLET | Freq: Two times a day (BID) | ORAL | Status: DC

## 2024-02-02 MED ORDER — COAGULATION FACTOR VIIA RECOMB 1 MG IV SOLR
90.0000 ug/kg | Freq: Once | INTRAVENOUS | Status: AC
  Administered 2024-02-02: 11000 ug via INTRAVENOUS
  Filled 2024-02-02: qty 10

## 2024-02-02 MED ORDER — PROTAMINE SULFATE 10 MG/ML IV SOLN
INTRAVENOUS | Status: DC | PRN
Start: 2024-02-02 — End: 2024-02-02
  Administered 2024-02-02 (×2): 50 mg via INTRAVENOUS
  Administered 2024-02-02: 400 mg via INTRAVENOUS

## 2024-02-02 MED ORDER — CALCIUM CHLORIDE 10 % IV SOLN
INTRAVENOUS | Status: DC | PRN
Start: 2024-02-02 — End: 2024-02-02
  Administered 2024-02-02 (×2): 500 mg via INTRAVENOUS
  Administered 2024-02-02: 1000 mg via INTRAVENOUS
  Administered 2024-02-02 (×4): 500 mg via INTRAVENOUS

## 2024-02-02 MED ORDER — ORAL CARE MOUTH RINSE
15.0000 mL | OROMUCOSAL | Status: DC
  Administered 2024-02-02 – 2024-02-27 (×289): 15 mL via OROMUCOSAL

## 2024-02-02 MED ORDER — BISACODYL 10 MG RE SUPP: 10.0000 mg | Freq: Every day | RECTAL | Status: DC

## 2024-02-02 MED ORDER — CEFAZOLIN SODIUM 1 G IJ SOLR
INTRAMUSCULAR | Status: AC
  Filled 2024-02-02: qty 30

## 2024-02-02 MED ORDER — VECURONIUM BROMIDE 10 MG IV SOLR
INTRAVENOUS | Status: AC
Start: 2024-02-02 — End: ?
  Filled 2024-02-02: qty 10

## 2024-02-02 MED ORDER — TRAMADOL HCL 50 MG PO TABS
50.0000 mg | ORAL_TABLET | ORAL | Status: DC | PRN
Start: 2024-02-02 — End: 2024-02-03

## 2024-02-02 MED ORDER — NOREPINEPHRINE 4 MG/250ML-% IV SOLN
INTRAVENOUS | Status: AC
  Filled 2024-02-02: qty 250

## 2024-02-02 MED ORDER — NITROGLYCERIN IN D5W 200-5 MCG/ML-% IV SOLN: 0.0000 ug/min | INTRAVENOUS | Status: DC

## 2024-02-02 MED ORDER — METOCLOPRAMIDE HCL 5 MG/ML IJ SOLN
10.0000 mg | Freq: Four times a day (QID) | INTRAMUSCULAR | Status: AC
  Administered 2024-02-02 – 2024-02-03 (×5): 10 mg via INTRAVENOUS
  Filled 2024-02-02 (×5): qty 2

## 2024-02-02 MED ORDER — EPINEPHRINE HCL 5 MG/250ML IV SOLN IN NS
0.0000 ug/min | INTRAVENOUS | Status: DC
  Administered 2024-02-02 – 2024-02-06 (×3): 2 ug/min via INTRAVENOUS
  Filled 2024-02-02 (×4): qty 250

## 2024-02-02 MED ORDER — SODIUM CHLORIDE 0.9% FLUSH
3.0000 mL | Freq: Two times a day (BID) | INTRAVENOUS | Status: DC
  Administered 2024-02-02 (×2): 3 mL via INTRAVENOUS
  Administered 2024-02-04 – 2024-02-05 (×3): 10 mL via INTRAVENOUS
  Administered 2024-02-06 (×2): 3 mL via INTRAVENOUS

## 2024-02-02 MED ORDER — SODIUM CHLORIDE 0.9% FLUSH
10.0000 mL | Freq: Two times a day (BID) | INTRAVENOUS | Status: DC
  Administered 2024-02-02 – 2024-02-03 (×3): 10 mL
  Administered 2024-02-04: 20 mL
  Administered 2024-02-04 – 2024-02-06 (×5): 10 mL

## 2024-02-02 MED ORDER — SODIUM CHLORIDE 0.9% IV SOLUTION: Freq: Once | INTRAVENOUS | Status: AC

## 2024-02-02 MED ORDER — SODIUM CHLORIDE 0.9 % IV SOLN: 250.0000 mL | INTRAVENOUS | Status: AC

## 2024-02-02 MED ORDER — NOREPINEPHRINE 16 MG/250ML-% IV SOLN
0.0000 ug/min | INTRAVENOUS | Status: DC
  Administered 2024-02-02 – 2024-02-07 (×2): 2 ug/min via INTRAVENOUS
  Filled 2024-02-02 (×2): qty 250

## 2024-02-02 MED ORDER — POTASSIUM CHLORIDE 10 MEQ/50ML IV SOLN
10.0000 meq | INTRAVENOUS | Status: DC
  Administered 2024-02-02 (×2): 10 meq via INTRAVENOUS
  Filled 2024-02-02: qty 50

## 2024-02-02 MED ORDER — SODIUM CHLORIDE 0.9% FLUSH: 10.0000 mL | INTRAVENOUS | Status: DC | PRN

## 2024-02-02 MED ORDER — FENTANYL 2500MCG IN NS 250ML (10MCG/ML) PREMIX INFUSION
50.0000 ug/h | INTRAVENOUS | Status: DC
  Administered 2024-02-02: 50 ug/h via INTRAVENOUS
  Filled 2024-02-02: qty 250

## 2024-02-02 MED ORDER — MAGNESIUM SULFATE 4 GM/100ML IV SOLN
4.0000 g | Freq: Once | INTRAVENOUS | Status: AC
  Administered 2024-02-02: 4 g via INTRAVENOUS
  Filled 2024-02-02: qty 100

## 2024-02-02 MED ORDER — SODIUM BICARBONATE 8.4 % IV SOLN
INTRAVENOUS | Status: DC | PRN
  Administered 2024-02-02 (×6): 50 meq via INTRAVENOUS

## 2024-02-02 MED ORDER — ALBUMIN HUMAN 5 % IV SOLN: INTRAVENOUS | Status: DC | PRN

## 2024-02-02 MED ORDER — COAGULATION FACTOR VIIA RECOMB 1 MG IV SOLR
45.0000 ug/kg | Freq: Once | INTRAVENOUS | Status: DC
  Filled 2024-02-02: qty 5

## 2024-02-02 MED ORDER — DEXMEDETOMIDINE HCL IN NACL 400 MCG/100ML IV SOLN
INTRAVENOUS | Status: AC
  Filled 2024-02-02: qty 100

## 2024-02-02 MED ORDER — SODIUM CHLORIDE 0.9 % IV SOLN
10.0000 mL/h | Freq: Once | INTRAVENOUS | Status: DC
Start: 2024-02-02 — End: 2024-02-02

## 2024-02-02 MED ORDER — METOPROLOL TARTRATE 25 MG/10 ML ORAL SUSPENSION: 12.5000 mg | Freq: Two times a day (BID) | ORAL | Status: DC

## 2024-02-02 MED ORDER — ORAL CARE MOUTH RINSE
15.0000 mL | OROMUCOSAL | Status: DC | PRN
  Administered 2024-02-09: 15 mL via OROMUCOSAL

## 2024-02-02 MED ORDER — METOPROLOL TARTRATE 5 MG/5ML IV SOLN: 2.5000 mg | INTRAVENOUS | Status: DC | PRN

## 2024-02-02 MED ORDER — DOCUSATE SODIUM 100 MG PO CAPS: 200.0000 mg | ORAL_CAPSULE | Freq: Every day | ORAL | Status: DC

## 2024-02-02 MED ORDER — CEFAZOLIN SODIUM-DEXTROSE 2-4 GM/100ML-% IV SOLN
2.0000 g | Freq: Three times a day (TID) | INTRAVENOUS | Status: AC
  Administered 2024-02-02 – 2024-02-03 (×6): 2 g via INTRAVENOUS
  Filled 2024-02-02 (×6): qty 100

## 2024-02-02 MED ORDER — BISACODYL 5 MG PO TBEC
10.0000 mg | DELAYED_RELEASE_TABLET | Freq: Every day | ORAL | Status: DC
  Administered 2024-02-03: 10 mg via ORAL
  Filled 2024-02-02: qty 2

## 2024-02-02 MED ORDER — ASPIRIN 325 MG PO TBEC: 325.0000 mg | DELAYED_RELEASE_TABLET | Freq: Every day | ORAL | Status: DC

## 2024-02-02 MED ORDER — STERILE WATER FOR INJECTION IV SOLN
INTRAVENOUS | Status: DC
  Filled 2024-02-02: qty 150
  Filled 2024-02-02: qty 1000

## 2024-02-02 MED ORDER — ACETAMINOPHEN 500 MG PO TABS: 1000.0000 mg | ORAL_TABLET | Freq: Four times a day (QID) | ORAL | Status: DC

## 2024-02-02 MED ORDER — SODIUM CHLORIDE 0.9% FLUSH
3.0000 mL | Freq: Two times a day (BID) | INTRAVENOUS | Status: DC
  Administered 2024-02-03 – 2024-02-06 (×7): 3 mL via INTRAVENOUS

## 2024-02-02 MED ORDER — INSULIN REGULAR(HUMAN) IN NACL 100-0.9 UT/100ML-% IV SOLN
INTRAVENOUS | Status: DC
  Administered 2024-02-02: 19 [IU]/h via INTRAVENOUS
  Administered 2024-02-02: 18 [IU]/h via INTRAVENOUS
  Filled 2024-02-02 (×4): qty 100

## 2024-02-02 MED ORDER — NOREPINEPHRINE 4 MG/250ML-% IV SOLN
0.0000 ug/min | INTRAVENOUS | Status: DC
  Administered 2024-02-02: 20 ug/min via INTRAVENOUS

## 2024-02-02 MED ORDER — CHLORHEXIDINE GLUCONATE CLOTH 2 % EX PADS
6.0000 | MEDICATED_PAD | Freq: Every day | CUTANEOUS | Status: DC
  Administered 2024-02-02 – 2024-02-27 (×27): 6 via TOPICAL

## 2024-02-02 MED ORDER — VASOPRESSIN 20 UNIT/ML IV SOLN
INTRAVENOUS | Status: DC | PRN
  Administered 2024-02-02: 1 [IU] via INTRAVENOUS

## 2024-02-02 MED ORDER — VANCOMYCIN HCL IN DEXTROSE 1-5 GM/200ML-% IV SOLN
1000.0000 mg | Freq: Once | INTRAVENOUS | Status: AC
  Administered 2024-02-02: 1000 mg via INTRAVENOUS
  Filled 2024-02-02: qty 200

## 2024-02-02 MED ORDER — ONDANSETRON HCL 4 MG/2ML IJ SOLN
4.0000 mg | Freq: Four times a day (QID) | INTRAMUSCULAR | Status: DC | PRN
  Filled 2024-02-02: qty 2

## 2024-02-02 MED ORDER — DEXTROSE 50 % IV SOLN: 0.0000 mL | INTRAVENOUS | Status: DC | PRN

## 2024-02-02 MED ORDER — ASPIRIN 81 MG PO CHEW: 324.0000 mg | CHEWABLE_TABLET | Freq: Every day | ORAL | Status: DC

## 2024-02-02 MED ORDER — COAGULATION FACTOR VIIA RECOMB 1 MG IV SOLR
4.0000 mg | Freq: Once | INTRAVENOUS | Status: AC
  Administered 2024-02-02: 4 mg via INTRAVENOUS
  Filled 2024-02-02: qty 4

## 2024-02-02 MED ORDER — POTASSIUM CHLORIDE 10 MEQ/50ML IV SOLN
10.0000 meq | INTRAVENOUS | Status: AC
Start: 2024-02-02 — End: 2024-02-03
  Administered 2024-02-02 (×2): 10 meq via INTRAVENOUS
  Filled 2024-02-02: qty 50

## 2024-02-02 MED ORDER — AMIODARONE HCL IN DEXTROSE 360-4.14 MG/200ML-% IV SOLN
30.0000 mg/h | INTRAVENOUS | Status: DC
  Administered 2024-02-02 – 2024-02-04 (×5): 30 mg/h via INTRAVENOUS
  Administered 2024-02-05 – 2024-02-06 (×5): 60 mg/h via INTRAVENOUS
  Administered 2024-02-06 – 2024-02-10 (×8): 30 mg/h via INTRAVENOUS
  Filled 2024-02-02 (×19): qty 200

## 2024-02-02 MED ORDER — ACETAMINOPHEN 160 MG/5ML PO SOLN
650.0000 mg | Freq: Once | ORAL | Status: AC
  Administered 2024-02-02: 650 mg
  Filled 2024-02-02: qty 20.3

## 2024-02-02 MED ORDER — POTASSIUM CHLORIDE 10 MEQ/50ML IV SOLN: 10.0000 meq | INTRAVENOUS | Status: DC

## 2024-02-02 MED ORDER — OXYCODONE HCL 5 MG PO TABS
5.0000 mg | ORAL_TABLET | ORAL | Status: DC | PRN
Start: 2024-02-02 — End: 2024-02-04
  Administered 2024-02-03 (×2): 10 mg
  Filled 2024-02-02 (×2): qty 2

## 2024-02-02 MED ORDER — AMIODARONE HCL IN DEXTROSE 360-4.14 MG/200ML-% IV SOLN
60.0000 mg/h | INTRAVENOUS | Status: AC
  Administered 2024-02-02: 60 mg/h via INTRAVENOUS

## 2024-02-02 MED ORDER — PANTOPRAZOLE SODIUM 40 MG IV SOLR
40.0000 mg | Freq: Every day | INTRAVENOUS | Status: DC
  Administered 2024-02-02: 40 mg via INTRAVENOUS
  Filled 2024-02-02: qty 10

## 2024-02-02 MED ORDER — AMIODARONE HCL IN DEXTROSE 360-4.14 MG/200ML-% IV SOLN
INTRAVENOUS | Status: AC
  Filled 2024-02-02: qty 200

## 2024-02-02 MED ORDER — ASPIRIN 81 MG PO CHEW
324.0000 mg | CHEWABLE_TABLET | Freq: Once | ORAL | Status: AC
  Administered 2024-02-02: 324 mg
  Filled 2024-02-02: qty 4

## 2024-02-02 NOTE — Transfer of Care (Signed)
 Immediate Anesthesia Transfer of Care Note  Patient: Dustin Davenport  Procedure(s) Performed: REPAIR OF ASCENDING AORTIC ANEURYSM USING A 32X12X10X10X10MM HEMASHIELD PLATINUM GRAFT. (Chest) ECHOCARDIOGRAM, TRANSESOPHAGEAL, INTRAOPERATIVE (Chest) BENTALL PROCEDURE USING A KONECT RESILIA AORTIC VALVE CONDUIT (Chest)  Patient Location: ICU  Anesthesia Type:General  Level of Consciousness: comatose and Patient remains intubated per anesthesia plan  Airway & Oxygen Therapy: Patient remains intubated per anesthesia plan and Patient placed on Ventilator (see vital sign flow sheet for setting)  Post-op Assessment: Report given to RN and Post -op Vital signs reviewed and stable  Post vital signs: Reviewed and stable  Dr. Allean [place] and Dr. Sherene Dilling at bedside.   Last Vitals:  Vitals Value Taken Time  BP 86/44 02/02/24 0635  Temp 35.8 C 02/02/24 0635  Pulse 118 02/02/24 0635  Resp 16 02/02/24 0635  SpO2 92 % 02/02/24 0635  Vitals shown include unfiled device data.       Complications: No notable events documented.

## 2024-02-02 NOTE — Progress Notes (Signed)
      [address]       [place] [address]             [phone]      S/p repair type 1 dissection  Intubated, sedated, agitated when sedation light  BP (!) 135/44   Pulse (!) 109   Temp (!) 100.9 F (38.3 C)   Resp 20   Ht 6\' 1"  (1.854 m)   Wt 129.1 kg   SpO2 95%   BMI 37.55 kg/m  18/10 CI 2.5 Amiodarone 30 Epi 4, norepi 17, vasopressin 0.04 Insulin 14 U/hr   Intake/Output Summary (Last 24 hours) at 02/02/2024 1810 Last data filed at 02/02/2024 1730 Gross per 24 hour  Intake 19697.44 ml  Output 5710 ml  Net 13987.44 ml   ~40 ml/hr form CT  Hct 24 PLT 85K- no signs of active bleeding- monitor  Nelda Luckey C. Luna Salinas, MD [facility] [phone]

## 2024-02-02 NOTE — Progress Notes (Addendum)
 Advanced Heart Failure Rounding Note  Cardiologist: None  Chief Complaint: Cardiogenic shock> Now s/p aortic dissection repair + Bentall Subjective:   Echo 4/15: AAA measuring 5-5.7 cm. Followed by emergent TEE 4/15 which was significant for torrential aortic regurgitation and dissection flap extending from the aortic root to the proximal ascending aorta   Now s/p aortic dissection repair + Bentall.   Currently on vaso 0.04, levo @20  and epi @ 4  Intubated and sedated.   Swann #s PA 32/21 (26) CO/CI 4.58/1.89 CVP unhooked Will follow up on SVR and PAPi once CVP hooked up Significant events:   - Echo 4/15: AAA measuring 5-5.7 cm. Followed by emergent TEE 4/15 which was significant for torrential aortic regurgitation and dissection flap extending from the aortic root to the proximal ascending aorta  - Emergent TEE 4/15: significant for torrential aortic regurgitation and dissection flap extending from the aortic root to the proximal ascending aorta  - CT C/A/P: Showed acute type A dissection with a 5.5 cm ascending aortic aneurysm extending into arch. Dissection goes down to infrarenal aorta and extends up into innominate and left common carotid arterie  - Taken to OR 4/15: Now s/p aortic dissection repair 02/02/24 (Replacement of the ascending aorta and proximal/mid aortic arch and Bentall).   Objective:   Weight Range: 120.2 kg Body mass index is 34.96 kg/m.   Vital Signs:   Pulse Rate:  [58-118] 117 (04/16 0631) Resp:  [13-26] 16 (04/16 0631) BP: (107-161)/(28-58) 135/44 (04/15 1715) SpO2:  [88 %-100 %] 91 % (04/16 0631) FiO2 (%):  [50 %-70 %] 70 % (04/16 0631) Weight:  [120.2 kg] 120.2 kg (04/15 0932) Last BM Date : 02/01/24  Weight change: Filed Weights   02/01/24 0932  Weight: 120.2 kg   Intake/Output:   Intake/Output Summary (Last 24 hours) at 02/02/2024 0715 Last data filed at 02/02/2024 0600 Gross per 24 hour  Intake 15493.03 ml  Output 4336 ml  Net  11157.03 ml    Physical Exam  General: intubated and sedated HEENT: +ETT, +OG Neck: supple. Swann RIJ Cor: PMI nondisplaced. Tachy regular rate & rhythm. No rubs, gallops or murmurs. + Y chest tube. +EPW wires Lungs: clear GU: +foley Extremities: no cyanosis, clubbing, rash, edema  Neuro: sedated  Telemetry   ST 110 with slight increases to 140s  EKG    ST with PACs 120s  Labs    CBC Recent Labs    02/01/24 0935 02/01/24 0949 02/02/24 0135 02/02/24 0136 02/02/24 0300 02/02/24 0305 02/02/24 0543 02/02/24 0546  WBC 7.2   < > 15.0*  --  14.0*  --   --   --   NEUTROABS 4.2  --   --   --   --   --   --   --   HGB 15.2   < > 8.7*   < > 8.7*   < > 8.8* 8.8*  HCT 45.0   < > 27.1*   < > 26.5*   < > 26.0* 26.0*  MCV 88.8   < > 98.9  --  93.6  --   --   --   PLT 142*   < > 103*  --  177  --   --   --    < > = values in this interval not displayed.   Basic Metabolic Panel Recent Labs    09/81/19 0935 02/01/24 0949 02/01/24 1151 02/01/24 1736 02/02/24 0430 02/02/24 0457 02/02/24 0543 02/02/24 0546  NA  143   < > 142   < > 150*   < > 153* 152*  K 3.1*   < > 3.7   < > 3.1*   < > 3.5 3.5  CL 109   < > 106   < > 107  --   --  108  CO2 20*  --  26  --   --   --   --   --   GLUCOSE 161*   < > 145*   < > 230*  --   --  208*  BUN 15   < > 16   < > 18  --   --  16  CREATININE 1.30*   < > 1.45*   < > 1.40*  --   --  1.40*  CALCIUM 9.2  --  9.1  --   --   --   --   --   MG  --   --  1.9  --   --   --   --   --    < > = values in this interval not displayed.   Liver Function Tests Recent Labs    02/01/24 0935 02/01/24 1151  AST 28 158*  ALT 23 34  ALKPHOS 51 51  BILITOT 0.7 0.7  PROT 6.5 6.3*  ALBUMIN 3.6 3.6   No results for input(s): "LIPASE", "AMYLASE" in the last 72 hours. Cardiac Enzymes No results for input(s): "CKTOTAL", "CKMB", "CKMBINDEX", "TROPONINI" in the last 72 hours.  BNP: BNP (last 3 results) No results for input(s): "BNP" in the last 8760  hours.  ProBNP (last 3 results) No results for input(s): "PROBNP" in the last 8760 hours.   D-Dimer No results for input(s): "DDIMER" in the last 72 hours. Hemoglobin A1C Recent Labs    02/01/24 1151  HGBA1C 5.3   Fasting Lipid Panel Recent Labs    02/01/24 0935  CHOL 160  HDL 31*  LDLCALC 93  TRIG 161*  CHOLHDL 5.2   Thyroid Function Tests No results for input(s): "TSH", "T4TOTAL", "T3FREE", "THYROIDAB" in the last 72 hours.  Invalid input(s): "FREET3"  Other results:   Imaging    DG Chest Port 1 View Result Date: 02/02/2024 CLINICAL DATA:  [id number].  Status post aortic dissection repair. EXAM: PORTABLE CHEST 1 VIEW COMPARISON:  CTA chest, abdomen and pelvis yesterday at 4:10 p.m. Is FINDINGS: 6:45 a.m. NGT tip extends just below the hiatus with the side hole in the distal esophagus and needs to be advanced into the stomach. ETT is in place with tip 5.3 cm from the carina. Right IJ Swan-Ganz catheter has its tip in the proximal right pulmonary artery. There is a least 1 mediastinal drain. There is a horizontal drain at the lower edge of the film which is probably in the pericardium. The mediastinal widening is not significantly changed. There is aortic atherosclerosis and dilatation. There is a new AVR in place, intact sternotomy sutures. There are low lung volumes. Stable mild cardiomegaly. Normal caliber central vessels. Left lower lobe opacity is seen and could be atelectasis or consolidation. Mild elevation noted right hemidiaphragm. The remaining lungs are clear. No new osseous finding aside from sternotomy. IMPRESSION: 1. NGT tip extends just below the hiatus with the side hole in the distal esophagus and needs to be advanced into the stomach. 2. ETT, right IJ Swan-Ganz catheter, mediastinal drain and horizontal drain at the lower edge of the film the latter which is probably in the  pericardium. 3. Stable mediastinal widening.  New AVR. 4. Left lower lobe opacity which could  be atelectasis or consolidation. Low lung volumes. 5. Stable cardiomegaly. Electronically Signed   By: Almira Bar M.D.   On: 02/02/2024 07:05   CT Angio Chest/Abd/Pel for Dissection W and/or W/WO Result Date: 02/01/2024 CLINICAL DATA:  Aortic aneurysm suspected, dissection flap on TTE EXAM: CT ANGIOGRAPHY CHEST, ABDOMEN AND PELVIS TECHNIQUE: Non-contrast CT of the chest was initially obtained. Multidetector CT imaging through the chest, abdomen and pelvis was performed using the standard protocol during bolus administration of intravenous contrast. Multiplanar reconstructed images and MIPs were obtained and reviewed to evaluate the vascular anatomy. RADIATION DOSE REDUCTION: This exam was performed according to the departmental dose-optimization program which includes automated exposure control, adjustment of the mA and/or kV according to patient size and/or use of iterative reconstruction technique. CONTRAST:  OMNIPAQUE IOHEXOL 350 MG/ML SOLN COMPARISON:  None Available. FINDINGS: CTA CHEST FINDINGS Pulmonary Embolism: No pulmonary embolism. Cardiovascular: Mild cardiomegaly. No pericardial effusion. The ascending aorta is dilated measuring 5.5 cm. The proximal arch is also dilated measuring 5.2 cm. Descending aortic dilation also present measuring 3.7 cm. Multi-vessel coronary atherosclerosis. Aorta: There is a in acute aortic dissection flap arising from the right lateral aspect of the aortic root causing moderate stenosis of the right renal artery ostium. This extends throughout the thoracic aorta and into abdominal aorta with the flap extending just below the level of the IMA ostium. Motion artifact favored in the brachiocephalic artery, which is supplied by the true lumen. Intimal flap extends into the proximal left common carotid artery. No intimal flap in the left subclavian artery, which is widely patent and supplied by the true lumen. Mediastinum/Nodes: No mediastinal mass. No mediastinal,  hilar, or axillary lymphadenopathy. Lungs/Pleura: The midline trachea and bronchi are patent. No focal airspace consolidation, pleural effusion, or pneumothorax. Posterior bibasilar dependent atelectasis. Review of the MIP images confirms the above findings. CTA ABDOMEN AND PELVIS FINDINGS VASCULAR Aorta: No significant contrast opacification of the false lumen in the upper abdomen, which supplies the celiac artery. No aneurysm. Celiac: Supplied by the false lumen without significant contrast opacification, which may be due to no flow or thrombosis. Distal reconstitution present from the SMA via the GDA and peripancreatic arcades. No aneurysm. SMA: Moderate narrowing at the ostium from protrusion of the intimal flap into the SMA ostium. Renals: On the right, there are 2 renal arteries, both supplied by the true lumen. On the left, the dominant renal artery is the most superior vessel, which demonstrates severe proximal narrowing from the intimal flap which extends into the proximal left renal artery. There is a diminutive left lower pole accessory artery, which closely apposes the intimal flap, possibly supplied by the false lumen; however, there is contrast opacification present. IMA: Patent, supplied by the true lumen. Without acute thrombus, aneurysm, or dissection. No hemodynamically significant stenosis. Inflow: Patent without acute thrombus, aneurysm, or dissection. No hemodynamically significant stenosis. Proximal Outflow: The bilateral common femoral and visualized portions of the superficial and profunda femoral arteries are patent without acute thrombus, aneurysm, or dissection. No hemodynamically significant stenosis. Veins: No obvious venous abnormality within the limitations of this arterial phase study. Review of the MIP images confirms the above findings. NON-VASCULAR Hepatobiliary: No mass. Reflux of contrast into the hepatic veins. No radiopaque stones or wall thickening of the gallbladder.No  intrahepatic or extrahepatic biliary ductal dilation.The portal veins are patent. Pancreas: Diffuse fatty atrophy of the pancreatic parenchyma. No  mass or ductal dilation.No peripancreatic inflammation or fluid collection. Spleen: Normal size. No mass. Adrenals/Urinary Tract: No adrenal masses. Mild renal cortical atrophy bilaterally. A couple of subcentimeter hypodensities are noted in the kidneys, too small to definitively characterize, but likely small cysts. 2 cm left upper pole cyst. No hydronephrosis or nephrolithiasis. The urinary bladder is distended without focal abnormality. Stomach/Bowel: The stomach is decompressed without focal abnormality. No small bowel wall thickening or inflammation. No small bowel obstruction. Normal appendix. Total colonic diverticulosis. No changes of acute diverticulitis. Lymphatic: No intraabdominal or pelvic lymphadenopathy. Reproductive: No prostatomegaly.No free pelvic fluid. Other: No pneumoperitoneum, ascites, or mesenteric inflammation. Musculoskeletal: No acute fracture or destructive lesion.Multilevel degenerative disc disease of the spine. Review of the MIP images confirms the above findings. IMPRESSION: 1. Fusiform ascending aortic aneurysm measuring 5.5 cm with superimposed acute aortic dissection arising from the right lateral aspect of the aortic root throughout the thoracic aorta, and into the upper abdomen with the intimal flap terminating at the IMA ostium. Of note, the ostium of the right coronary artery is at least moderately narrowed. It is difficult to ascertain whether the intimal flap extends into the proximal portion of the vessel. 2. The brachiocephalic and left subclavian arteries are supplied by the true lumen. The intimal flap extends into the left common carotid artery with a severely narrowed true lumen. Changes in the brachiocephalic artery are favored to represent motion artifact rather than flap propagation. 3. No significant contrast  opacification of the false lumen within the upper abdominal aorta. The celiac artery is not opacified, supplied by the false lumen, possibly due to lack of contrast opacification versus thrombosis. Distal reconstitution is present via the SMA, GDA, and peripancreatic arcades. 4. 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 5. Moderate narrowing of the SMA ostium from propagation of the intimal flap into the ostium. 6. Reflux of contrast into the hepatic veins, suggest underlying cardiac dysfunction. Critical Value/emergent results were called by telephone at the time of interpretation on 02/01/2024 at 6:58 pm to provider London Sheer, who advised that the patient was already undergoing dissection surgery with the cardiothoracic surgeon. Electronically Signed   By: Wallie Char M.D.   On: 02/01/2024 19:04   ECHOCARDIOGRAM COMPLETE Result Date: 02/01/2024    ECHOCARDIOGRAM REPORT   Patient Name:   Dustin Davenport Date of Exam: 02/01/2024 Medical Rec #:  [id number]    Height:       73.0 in Accession #:    [id number]   Weight:       265.0 lb Date of Birth:  1956-06-24    BSA:          2.425 m Patient Age:    67 years     BP:           127/37 mmHg Patient Gender: M            HR:           93 bpm. Exam Location:  Inpatient Procedure: 2D Echo, Intracardiac Opacification Agent, Cardiac Doppler and Color            Doppler (Both Spectral and Color Flow Doppler were utilized during            procedure). Indications:    R07.9* Chest pain, unspecified; I25.110 Atherosclerotic heart                 disease of native coronary artery  with unstable angina pectoris  History:        Patient has no prior history of Echocardiogram examinations.                 CAD; Signs/Symptoms:Chest Pain.  Sonographer:    Sheralyn Boatman RDCS Referring Phys: [phone] Demetria Pore [place]  Sonographer Comments: Technically difficult study due to poor echo windows and patient is obese. Image  acquisition challenging due to patient body habitus. Patient supine post PCI. Could not turn. IMPRESSIONS  1. Left ventricular ejection fraction, by estimation, is 50 to 55%. The left ventricle has low normal function. The left ventricle has no regional wall motion abnormalities.  2. Right ventricular systolic function is moderately reduced. The right ventricular size is moderately enlarged.  3. The mitral valve is normal in structure. No evidence of mitral valve regurgitation. No evidence of mitral stenosis.  4. The aortic valve is normal in structure. Aortic valve regurgitation is mild. No aortic stenosis is present.  5. Can not exclude dilated aorta (potential artifact inhibiting definitive diagonosis). There is also a linear mid aorta demarcation which seems to move with opening and closure of aortic valve. Would recommend further imaging to exclude aortic false lumen. Discussed with shock team. . Aneurysm of the ascending aorta, measuring 52 mm. There is severe dilatation of the ascending aorta, measuring 52 mm.  6. The inferior vena cava is normal in size with greater than 50% respiratory variability, suggesting right atrial pressure of 3 mmHg. FINDINGS  Left Ventricle: Left ventricular ejection fraction, by estimation, is 50 to 55%. The left ventricle has low normal function. The left ventricle has no regional wall motion abnormalities. Definity contrast agent was given IV to delineate the left ventricular endocardial borders. The left ventricular internal cavity size was normal in size. There is no left ventricular hypertrophy.  LV Wall Scoring: The mid inferoseptal segment and basal inferoseptal segment are akinetic. Right Ventricle: The right ventricular size is moderately enlarged. No increase in right ventricular wall thickness. Right ventricular systolic function is moderately reduced. Left Atrium: Left atrial size was normal in size. Right Atrium: Right atrial size was normal in size. Pericardium:  There is no evidence of pericardial effusion. Mitral Valve: The mitral valve is normal in structure. No evidence of mitral valve regurgitation. No evidence of mitral valve stenosis. Tricuspid Valve: The tricuspid valve is normal in structure. Tricuspid valve regurgitation is not demonstrated. No evidence of tricuspid stenosis. Aortic Valve: The aortic valve is normal in structure. Aortic valve regurgitation is mild. No aortic stenosis is present. Pulmonic Valve: The pulmonic valve was normal in structure. Pulmonic valve regurgitation is not visualized. No evidence of pulmonic stenosis. Aorta: Can not exclude dilated aorta (potential artifact inhibiting definitive diagonosis). There is also a linear mid aorta demarcation which seems to move with opening and closure of aortic valve. Would recommend further imaging to exclude aortic false  lumen. Discussed with shock team. The aortic root is normal in size and structure. There is severe dilatation of the ascending aorta, measuring 52 mm. There is an aneurysm involving the ascending aorta measuring 52 mm. Venous: The inferior vena cava is normal in size with greater than 50% respiratory variability, suggesting right atrial pressure of 3 mmHg. IAS/Shunts: No atrial level shunt detected by color flow Doppler.  LEFT VENTRICLE PLAX 2D LVIDd:         6.30 cm      Diastology LVIDs:         4.00 cm  LV e' medial:    7.30 cm/s LV PW:         1.10 cm      LV E/e' medial:  13.6 LV IVS:        1.20 cm      LV e' lateral:   7.78 cm/s LVOT diam:     2.50 cm      LV E/e' lateral: 12.8 LV SV:         113 LV SV Index:   47 LVOT Area:     4.91 cm  LV Volumes (MOD) LV vol d, MOD A2C: 198.0 ml LV vol d, MOD A4C: 188.0 ml LV vol s, MOD A2C: 76.4 ml LV vol s, MOD A4C: 84.4 ml LV SV MOD A2C:     121.6 ml LV SV MOD A4C:     188.0 ml LV SV MOD BP:      109.9 ml RIGHT VENTRICLE             IVC RV S prime:     22.40 cm/s  IVC diam: 2.50 cm TAPSE (M-mode): 2.2 cm LEFT ATRIUM              Index        RIGHT ATRIUM           Index LA diam:        2.80 cm 1.15 cm/m   RA Area:     22.90 cm LA Vol (A2C):   65.6 ml 27.05 ml/m  RA Volume:   68.50 ml  28.25 ml/m LA Vol (A4C):   43.5 ml 17.94 ml/m LA Biplane Vol: 54.9 ml 22.64 ml/m  AORTIC VALVE LVOT Vmax:   96.90 cm/s LVOT Vmean:  66.200 cm/s LVOT VTI:    0.230 m  AORTA Ao Root diam: 4.70 cm Ao Asc diam:  5.40 cm MITRAL VALVE MV Area (PHT): 3.26 cm    SHUNTS MV Decel Time: 233 msec    Systemic VTI:  0.23 m MV E velocity: 99.60 cm/s  Systemic Diam: 2.50 cm MV A velocity: 41.60 cm/s MV E/A ratio:  2.39 Dorothye Gathers MD Electronically signed by Dorothye Gathers MD Signature Date/Time: 02/01/2024/3:09:36 PM    Final    US  EKG SITE RITE Result Date: 02/01/2024 If Site Rite image not attached, placement could not be confirmed due to current cardiac rhythm.  CARDIAC CATHETERIZATION Result Date: 02/01/2024   Mid LAD lesion is 25% stenosed.   Mid Cx to Dist Cx lesion is 40% stenosed.   Prox RCA to Mid RCA lesion is 70% stenosed.   Dist RCA lesion is 99% stenosed.   A drug-eluting stent was successfully placed using a SYNERGY XD 2.50X38.   A stent was successfully placed.   A drug-eluting stent was successfully placed using a STENT SYS SYNERGY XD 3.0X38.   A drug-eluting stent was successfully placed using a STENT SYNERGY XD 3.0X32.   Post intervention, there is a 0% residual stenosis.   Post intervention, there is a 0% residual stenosis.   Recommend uninterrupted dual antiplatelet therapy with Aspirin 81mg  daily and Prasugrel 10mg  daily for a minimum of 12 months (ACS-Class I recommendation). 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 Risk factor modification. Check Echo. May be a candidate for low risk STEMI DC tomorrow if no complications.     Medications:  Scheduled Medications:  sodium chloride   Intravenous Once   [START  ON 02/03/2024] acetaminophen  1,000 mg Per Tube Q6H   Or   [START ON 02/03/2024] acetaminophen (TYLENOL) oral liquid 160 mg/5 mL  1,000 mg Per Tube Q6H   acetaminophen (TYLENOL) oral liquid 160 mg/5 mL  650 mg Per Tube Once   aspirin  324 mg Per Tube Once   [START ON 02/03/2024] aspirin EC  325 mg Oral Daily   Or   [START ON 02/03/2024] aspirin  324 mg Per Tube Daily   atorvastatin  80 mg Oral Daily   [START ON 02/03/2024] bisacodyl  10 mg Oral Daily   Or   [START ON 02/03/2024] bisacodyl  10 mg Rectal Daily   chlorhexidine  15 mL Mouth/Throat NOW   [START ON 02/03/2024] docusate sodium  200 mg Oral Daily   metoCLOPramide (REGLAN) injection  10 mg Intravenous Q6H   metoprolol tartrate  12.5 mg Oral BID   Or   metoprolol tartrate  12.5 mg Per Tube BID   mupirocin ointment  1 Application Nasal BID   [START ON 02/04/2024] pantoprazole  40 mg Oral Daily   pantoprazole (PROTONIX) IV  40 mg Intravenous QHS   [START ON 02/03/2024] sodium chloride flush  3 mL Intravenous Q12H   sodium chloride flush  3-10 mL Intravenous Q12H    Infusions:  sodium chloride     [START ON 02/03/2024] sodium chloride     albumin human 12.5 g (02/02/24 0714)    ceFAZolin (ANCEF) IV     dexmedetomidine (PRECEDEX) IV infusion     epinephrine 4 mcg/min (02/02/24 0625)   insulin 8 Units/hr (02/02/24 0650)   lactated ringers     lactated ringers     magnesium sulfate     nitroGLYCERIN     norepinephrine     norepinephrine 20 mcg/min (02/02/24 0630)   potassium chloride 10 mEq (02/02/24 0704)   vancomycin     vasopressin 0.04 Units/min (02/02/24 0630)    PRN Medications: sodium chloride, albumin human, dextrose, metoprolol tartrate, midazolam, morphine injection, norepinephrine, ondansetron (ZOFRAN) IV, oxyCODONE, [START ON 02/03/2024] sodium chloride flush, sodium chloride flush, traMADol  Patient Profile   Dustin Davenport is a 68 y.o. male with HTN and HLD. Admitted with STEMI now with CGS. Found to have AAA and  taken emergently to the OR.   Assessment/Plan  Acute type A aortic dissection  - Echo 4/15: AAA measuring 5-5.7 cm - Emergent TEE 4/15: significant for torrential aortic regurgitation and dissection flap extending from the aortic root to the proximal ascending aorta  - CT C/A/P: Showed acute type A dissection with a 5.5 cm ascending aortic aneurysm extending into arch. Dissection goes down to infrarenal aorta and extends up into innominate and left common carotid arterie  - Followed by TCTS, taken to OR 4/15 - Now s/p aortic dissection repair 02/02/24 (Replacement of the ascending aorta and proximal/mid aortic arch and Bentall).  - Received 6uPRBCs, 6 plt, 4 FFP and 5 cryo intra-op.    Cardiogenic shock - Post perfusion CGS with wide pulse pressure - Echo 4/15: reviewed with Dr. Bruce Caper: EF 55-60%, RV mild-mod reduced - Check co-ox and CVP - Volume management today per TCTS. Currently getting volume resuscitation with albumin. Received severed blood products intra-op as above. Net + 15L - Lactic acid last 3.6. Continue to trend - Continue pressor support as above, target SBP >90    CAD, STEMI - LHC with single vessel occlusive  CAD involving the mid to distal RCA, successful PCI of the RCA with overlapping DES x 3. - Has some ongoing chest pressure, suspect 2/2 reperfusion  - Continue ASA   - Continue statin. LDL 93. Goal <70   HTN - Persistent wide pulse pressure - Pressor support as above  A fib PVCs - Patient with a fib intra-op.  - Continue amiodarone gtt - Now in ST with PVCs - Keep K>4 and Mg >2  - Eventual heparin gtt  Hypokalemia - K down to 2.6 intra-op.  - Has been aggressively repleted, ongoing - Last K 3.5   CRITICAL CARE Performed by: Sheryl Donna   Total critical care time: 18 minutes  Critical care time was exclusive of separately billable procedures and treating other patients.  Critical care was necessary to treat or prevent imminent or  life-threatening deterioration.  Critical care was time spent personally by me on the following activities: development of treatment plan with patient and/or surrogate as well as nursing, discussions with consultants, evaluation of patient's response to treatment, examination of patient, obtaining history from patient or surrogate, ordering and performing treatments and interventions, ordering and review of laboratory studies, ordering and review of radiographic studies, pulse oximetry and re-evaluation of patient's condition.     Length of Stay: 1  Sheryl Donna, NP  02/02/2024, 7:15 AM  Advanced Heart Failure Team Pager [phone] (M-F; 7a - 5p)  Please contact [facility] for night-coverage after hours (5p -7a ) and weekends on [email or web address]  Agree with above  Back from OR this am after Bentall/AVR and reimplantation of coronaries. Intra-op course c/b coagulopathy due to Effient (PCI of RCA earlier in the day)   Remains intubated but waking up and moving.   CTs ok .  On NE 20. Epi 4, VP .Outputs ok. Remains in NSR  Scr 1.4  General:  Intubated. Waking up on vent HEENT + ETT Neck: supple. RIJ swan . Cor: Regular. Surgical dressing and CTs in place Lungs: coarse Abdomen: obese soft. Hypoactive bowel sounds. Extremities: no cyanosis, clubbing, rash, 1+ edema Neuro:intubated but waking up   He is immediately post-op. Remains tenuous but hopefully stabilizing. Wean pressors as tolerated. Follow H&H. Keep hgb >= 8.0  D/w TCTS. Will keep intubated today.   CRITICAL CARE Performed by: Jules Oar  Total critical care time: 50 minutes  Critical care time was exclusive of separately billable procedures and treating other patients.  Critical care was necessary to treat or prevent imminent or life-threatening deterioration.  Critical care was time spent personally by me (independent of midlevel providers or residents) on the following activities: development of treatment plan  with patient and/or surrogate as well as nursing, discussions with consultants, evaluation of patient's response to treatment, examination of patient, obtaining history from patient or surrogate, ordering and performing treatments and interventions, ordering and review of laboratory studies, ordering and review of radiographic studies, pulse oximetry and re-evaluation of patient's condition.  Jules Oar, MD  5:55 PM

## 2024-02-02 NOTE — Brief Op Note (Signed)
 02/01/2024  6:59 AM  PATIENT:  Dustin Davenport  68 y.o. male  PRE-OPERATIVE DIAGNOSIS:  aorta dissection  POST-OPERATIVE DIAGNOSIS:  aorta dissection  PROCEDURE:  Procedure(s) with comments: REPAIR OF ASCENDING AORTIC ANEURYSM USING A 32X12X10X10X10MM HEMASHIELD PLATINUM GRAFT. (N/A) - Repair of Aortic Dissection, Right Axillary Cannulation. ECHOCARDIOGRAM, TRANSESOPHAGEAL, INTRAOPERATIVE (N/A) BENTALL PROCEDURE USING A KONECT RESILIA AORTIC VALVE CONDUIT (N/A)  SURGEON:  Surgeons and Role:    * Bartle, Shellie Dials, MD - Primary  PHYSICIAN ASSISTANT: Anyjah Roundtree PA-C  ASSISTANTS: MALLORY HARDY RNFA   ANESTHESIA:   general  EBL:  3150 mL   BLOOD ADMINISTERED:2 UNITS PRBC'S FFP, CRYO , PLATELETS , FACTOR 7  DRAINS:  MEDIASTINAL CHEST TUBES    LOCAL MEDICATIONS USED:  NONE  SPECIMEN:  Source of Specimen:  AORTIC DISSECTION, AORTIC VALVE  DISPOSITION OF SPECIMEN:  PATHOLOGY  COUNTS:  YES  TOURNIQUET:  * No tourniquets in log *  DICTATION: .Dragon Dictation  PLAN OF CARE: Admit to inpatient   PATIENT DISPOSITION:  ICU - intubated and hemodynamically stable.   Delay start of Pharmacological VTE agent (>24hrs) due to surgical blood loss or risk of bleeding: yes

## 2024-02-02 NOTE — Hospital Course (Addendum)
 HPI: The pt is a 68 year old with a hx of HTN, HLD who developed acute chest and upper abdominal pain with SOB and diaphoresis about 8 am today. EMS ECG showed acute inferior STEMI with ST elevation in inferior leads and ST depression in anterior leads. Taken directly to cath lab with chest pain ongoing. Cath showed diffuse RCA disease treated with 3 overlapping DES. He received Effient  and heparin . After arrival to ICU he developed recurrent severe CP and noted to have wide pulse pressure. Echo at 3 pm had shown mild AI and TEE after chest pain and wide pulse pressure developed showed severe AI and signs of aortic dissection. CTA chest, abd, pelvis shows acute type A dissection with a 5+ cm ascending aortic aneurysm extending into arch. The dissection goes down to infrarenal aorta and extends up into innominate and left common carotid arteries. He is fairly stable on no vasopressors at this time but has wide pulse pressure with diastolic in the 30's.   Dr. Sherene Dilling evaluated the patient and his studies and recommended proceeding emergently to the operating room for repair of his  ascending aortic dissection.  Hospital course:  The patient was taken the operating room emergently on 02/01/2024 and underwent repair of ascending aortic aneurysm/dissection with replacement of the ascending aorta and proximal/mid aortic arch using a 32 x 12 by 10 x 10 x 10 mm Hemashield graft under deep hypothermic circulatory arrest, antegrade cerebral perfusion.  Additionally, bypass to the innominate and left carotid arteries using sidebranch grafts, and biological Bentall procedure using a 27 mm Edwards connect Resilia pericardial valved conduit with reimplantation of left and right coronary arteries. He tolerated the procedure and was transferred to the SICU in critical but stable condition. He developed cardiogenic shock and required levophed , vasopressin  and epinephrine  postoperatively for hemodynamic support. These were weaned  as hemodynamics tolerated. PCCM was consulted to assist with management of multisystem organ dysfunction. He was extubated the morning of 04/18. He developed intraoperative atrial fibrillation, Amiodarone  gtt was started.  He was eventually started on heparin  for possible cardioversion.  He developed an AKI likely due to ATN from malperfusion, creatinine peaked at 3.32 but improved with time. He developed thrombocytopenia which was monitored clinically. He also had a preoperative inferior STEMI and would require DAPT prior to discharge. Cortrak was placed and he was started on trickle tube feeds. He was re intubated the afternoon of 04/19 (likely aspiration). He had leukocytosis and WBC was 18,700 on 04/20, BAL cultures grew normal flora. He was on Vancomycin  and Zosyn  for possible pneumonia.  He has remained on the ventilator and consideration is being made for possible tracheostomy.  Chest tubes were removed on 04/20. He was routinely diuresed.  He had and did increase diuresis was subsequently held for hypotension.  Tube feeds were advanced to goal and he was tolerating them well.  He developed a low grade fever overnight.  He was started on Neo-synephrine for blood pressure supply.  He is following intermittent commands. RHC was performed and was unremarkable. Leukocytosis persisted but he remained afebrile, Vancomycin , Meropenem  and Micafungin  was continued. He was diffusely edematous on exam and diuresed. He developed superficial vein thrombosis of the left cephalic vein and thrombosis of the left gastrocnemius vein. Sedation was weaned and tracheostomy was placed on 02/18/2024.  Despite improvement of his creatinine level he required initiation of CRRT on 5/2 for refractory hypernatremia, renal failure, and volume overloaded state per Nephrology.

## 2024-02-02 NOTE — Op Note (Signed)
 CARDIOVASCULAR SURGERY OPERATIVE NOTE  02/02/2024  Surgeon:  Bartley Lightning, MD  First Assistant: Matt Song,  PA-C: An experienced assistant was required given the complexity of this surgery and the standard of surgical care. The assistant was needed for exposure, dissection, suctioning, retraction of delicate tissues and sutures, instrument exchange and for overall help during this procedure.    Preoperative Diagnosis:  5.5 cm ascending aortic aneurysm    Postoperative Diagnosis:  Same   Procedure:  Median Sternotomy Right axillary artery cannulation Extracorporeal circulation 3.   Replacement of the ascending aorta and proximal/mid aortic arch using a 32 x 12 x10 x 10 x 10 mm Hemashield graft under deep hypothermic circulatory arrest, antegrade cerebral perfusion. 4.   Bypass to the innominate and left common carotid arteries using the above side branch grafts. 4.   Biological Bentall Procedure using a 27 mm Edwards KONECT RESILIA pericardial valved conduit  with reimplantation of left and right coronary arteries.  Anesthesia:  General Endotracheal   Clinical History/Surgical Indication:    Acute type A aortic dissection with ascending aortic aneurysm and severe AI in setting of acute inferior STEMI treated today with extensive RCA stenting. CTA of chest/abdomen/pelvis showed acute type A aortic dissection and aneurysm as noted below in the CT report. This will require emergent surgery to include Bentall procedure with bioprosthetic valve conduit, reimplantation of coronary arteries, replacement of ascending aorta and possibly part of arch. Unfortunately he also received Effient which will significantly increase the risk of bleeding. I discussed the operative procedure with the patient, wife and daughter including alternatives, benefits and risks; including but not limited to bleeding, blood transfusion, infection, stroke, myocardial infarction, heart block requiring a permanent  pacemaker, organ dysfunction, and death.  Dustin Davenport understands and agrees to proceed.       Narrative & Impression  CLINICAL DATA:  Aortic aneurysm suspected, dissection flap on TTE   EXAM: CT ANGIOGRAPHY CHEST, ABDOMEN AND PELVIS   TECHNIQUE: Non-contrast CT of the chest was initially obtained. Multidetector CT imaging through the chest, abdomen and pelvis was performed using the standard protocol during bolus administration of intravenous contrast. Multiplanar reconstructed images and MIPs were obtained and reviewed to evaluate the vascular anatomy.   RADIATION DOSE REDUCTION: This exam was performed according to the departmental dose-optimization program which includes automated exposure control, adjustment of the mA and/or kV according to patient size and/or use of iterative reconstruction technique.   CONTRAST:  100mL OMNIPAQUE IOHEXOL 350 MG/ML SOLN   COMPARISON:  None Available.   FINDINGS: CTA CHEST FINDINGS   Pulmonary Embolism: No pulmonary embolism.   Cardiovascular: Mild cardiomegaly. No pericardial effusion. The ascending aorta is dilated measuring 5.5 cm. The proximal arch is also dilated measuring 5.2 cm. Descending aortic dilation also present measuring 3.7 cm. Multi-vessel coronary atherosclerosis.   Aorta: There is a in acute aortic dissection flap arising from the right lateral aspect of the aortic root causing moderate stenosis of the right renal artery ostium. This extends throughout the thoracic aorta and into abdominal aorta with the flap extending just below the level of the IMA ostium. Motion artifact favored in the brachiocephalic artery, which is supplied by the true lumen. Intimal flap extends into the proximal left common carotid artery. No intimal flap in the left subclavian artery, which is widely patent and supplied by the true lumen.   Mediastinum/Nodes: No mediastinal mass. No mediastinal, hilar, or axillary lymphadenopathy.    Lungs/Pleura: The midline trachea and bronchi are  patent. No focal airspace consolidation, pleural effusion, or pneumothorax. Posterior bibasilar dependent atelectasis.   Review of the MIP images confirms the above findings.   CTA ABDOMEN AND PELVIS FINDINGS   VASCULAR   Aorta: No significant contrast opacification of the false lumen in the upper abdomen, which supplies the celiac artery. No aneurysm.   Celiac: Supplied by the false lumen without significant contrast opacification, which may be due to no flow or thrombosis. Distal reconstitution present from the SMA via the GDA and peripancreatic arcades. No aneurysm.   SMA: Moderate narrowing at the ostium from protrusion of the intimal flap into the SMA ostium.   Renals: On the right, there are 2 renal arteries, both supplied by the true lumen. On the left, the dominant renal artery is the most superior vessel, which demonstrates severe proximal narrowing from the intimal flap which extends into the proximal left renal artery. There is a diminutive left lower pole accessory artery, which closely apposes the intimal flap, possibly supplied by the false lumen; however, there is contrast opacification present.   IMA: Patent, supplied by the true lumen. Without acute thrombus, aneurysm, or dissection. No hemodynamically significant stenosis.   Inflow: Patent without acute thrombus, aneurysm, or dissection. No hemodynamically significant stenosis.   Proximal Outflow: The bilateral common femoral and visualized portions of the superficial and profunda femoral arteries are patent without acute thrombus, aneurysm, or dissection. No hemodynamically significant stenosis.   Veins: No obvious venous abnormality within the limitations of this arterial phase study.   Review of the MIP images confirms the above findings.   NON-VASCULAR   Hepatobiliary: No mass. Reflux of contrast into the hepatic veins. No radiopaque stones or  wall thickening of the gallbladder.No intrahepatic or extrahepatic biliary ductal dilation.The portal veins are patent.   Pancreas: Diffuse fatty atrophy of the pancreatic parenchyma. No mass or ductal dilation.No peripancreatic inflammation or fluid collection.   Spleen: Normal size. No mass.   Adrenals/Urinary Tract: No adrenal masses. Mild renal cortical atrophy bilaterally. A couple of subcentimeter hypodensities are noted in the kidneys, too small to definitively characterize, but likely small cysts. 2 cm left upper pole cyst. No hydronephrosis or nephrolithiasis. The urinary bladder is distended without focal abnormality.   Stomach/Bowel: The stomach is decompressed without focal abnormality. No small bowel wall thickening or inflammation. No small bowel obstruction. Normal appendix. Total colonic diverticulosis. No changes of acute diverticulitis.   Lymphatic: No intraabdominal or pelvic lymphadenopathy.   Reproductive: No prostatomegaly.No free pelvic fluid.   Other: No pneumoperitoneum, ascites, or mesenteric inflammation.   Musculoskeletal: No acute fracture or destructive lesion.Multilevel degenerative disc disease of the spine.   Review of the MIP images confirms the above findings.   IMPRESSION: 1. Fusiform ascending aortic aneurysm measuring 5.5 cm with superimposed acute aortic dissection arising from the right lateral aspect of the aortic root throughout the thoracic aorta, and into the upper abdomen with the intimal flap terminating at the IMA ostium. Of note, the ostium of the right coronary artery is at least moderately narrowed. It is difficult to ascertain whether the intimal flap extends into the proximal portion of the vessel. 2. The brachiocephalic and left subclavian arteries are supplied by the true lumen. The intimal flap extends into the left common carotid artery with a severely narrowed true lumen. Changes in the brachiocephalic artery are  favored to represent motion artifact rather than flap propagation. 3. No significant contrast opacification of the false lumen within the upper abdominal aorta. The celiac  artery is not opacified, supplied by the false lumen, possibly due to lack of contrast opacification versus thrombosis. Distal reconstitution is present via the SMA, GDA, and peripancreatic arcades. 4. 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 5. Moderate narrowing of the SMA ostium from propagation of the intimal flap into the ostium. 6. Reflux of contrast into the hepatic veins, suggest underlying cardiac dysfunction.   Critical Value/emergent results were called by telephone at the time of interpretation on 02/01/2024 at 6:58 pm to provider Gerold Kos, who advised that the patient was already undergoing dissection surgery with the cardiothoracic surgeon.     Electronically Signed   By: Rance Burrows M.D.   On: 02/01/2024 19:04     Preparation:  The patient was seen in the ICU and the correct patient, correct operation were confirmed with the patient after reviewing the medical record, catheterization and CTA images. The consent was signed by me. Preoperative antibiotics were given. He was taken directly to the OR room. The patient was positioned supine on the operating room table. After being placed under general endotracheal anesthesia by the anesthesia team a foley catheter was placed. A pulmonary arterial line and left radial and right brachial arterial lines were placed by the anesthesia team.  The neck, chest, abdomen, and both legs were prepped with betadine soap and solution and draped in the usual sterile manner. A surgical time-out was taken and the correct patient and operative procedure were confirmed with the nursing and anesthesia staff.  TEE:  Performed by Dr. Dane Dung. This showed a trileaflet aortic valve with prolapse of the  right and non-coronary cusps with severe AI, trivial MR, mild TR.  There was normal LV systolic function and mildly decrease RV systolic function.   Cardiopulmonary Bypass:  A median sternotomy was performed. The pericardium was opened in the midline. Right ventricular function appeared good . The ascending aorta was aneurysmal with ecchymosis throughout the wall. There was obvious aneurysmal dilation extending beyond the innominate artery.   Right axillary artery exposure and cannulation:  A transverse incision was made below the right clavicle. The pectoralis major muscle was split along its fibers and the pectoralis minor muscle was retracted laterally. The brachial plexus was identified and gently retracted laterally to expose the axillary artery. The artery was controlled proximally and distally with vessel loops. The patient was fully heparinized and ACT maintained greater than 400. The axillary artery was clamped proximally and distally with peripheral Debakey clamps. It was opened longitudinally. There was no sign of dissection here. An 8 mm Hemashield dacron graft was anastomosed in an end to side manner using continuous 5-0 prolene suture. Prevaleak was applied for hemostasis and the clamp removed. The graft was connected to the arterial end of the bypass circuit which was split into a Y.   Venous cannulation was performed via the right atrial appendage using a two-staged venous cannula.  A temperature probe was inserted into the interventricular septum and an insulating pad was placed in the pericardium. CO2 was insufflated into the pericardium throughout the case to minimize intracardiac air.    Resection and grafting of ascending aortic aneurysm:  The patient was placed on cardiopulmonary bypass and a left ventricular vent was placed via the right superior pulmonary vein. Systemic cooling was begun with a goal temperature of 18 degrees centigrade by bladder  temperature probe. A  retrograde cardioplegia cannula was placed through the right  atrium into the coronary sinus without difficulty. The innominate artery was identified and encircled with a tape. The aortic arch still measured 5 cm at the takeoff of the innominate and left carotid arteries. Just beyond the left carotid the aorta decreased in size to about 3.5 cm. I though this would be the best place for anastomosis. After 30 minutes of cooling the target temperature of 18 degrees centigrade was reached. Cerebral oximetry was 70% bilaterally. BIS was zero. The patient was given Propofol and 125 mg of Solumedrol. The head was packed in ice. The bed was placed in steep trendelenburg. Circulatory arrest was begun and the blood volume emptied into the venous reservoir. The innominate artery and left carotid artery were clamped and continuous antegrade cerebral perfusion was started through the axillary artery.  Cold KBC retrograde cardioplegia was given and myocardial temperature dropped to 10 degrees centigrade. Additional doses were given at approximately 60 minute intervals throughout the period of circulatory arrest and cross-clamping. Complete diastolic arrest was maintained. The aorta was transected just proximal to the innominate artery to allow inspection of the interior of the aortic arch. There was a tear on the anterior wall of the arch at the level of the left carotid artery. The aorta was transected between the left carotid and the left subclavian arteries. The aortic diameter was measured at 3.5 mm here. The A 32 x 12 x 10 x10 x 10 mm Hemasheild Platinum vascular graft was prepared. ( REF # [id number] P0, Lot [id number], SN [id number]). The distal 10 mm side arm graft was ligated with a heavy silk tie and suture ligated with a 3-0 prolene pledgetted horizontal mattress suture. The graft was cut to the appropriate length. It was anastomosed to the aortic arch just proximal to the left subclavian artery in an end to end manner  using 3-0 prolene continuous suture with a felt strip inside to bring the dissected layers back together and a felt strip on the outside to reinforce the anastomisis. A light coating of Prevaleak was applied to seal needle holes. The other end of the arterial Y of the bypass circuit was then connected to the 10mm side arm graft and circulation was slowly resumed. The aortic graft was cross-clamped proximal to the side arm graft and full CPB support was resumed. Circulatory arrest time was 65 minutes. Antegrade cerebral perfusion time was 62 minutes.  Bypass of left common carotid artery:   One of the 10 mm side arm graft was brought under the brachiocephalic vein and anastomosed to the left carotid in end to end manner using continuous 5-0 prolene suture. The anastomosis was flushed and the clamps removed.  Bypass of Innominate artery:   The 12 mm side arm graft was brought under the brachiocephalic vein and anastomosed to the innominate artery in end to end manner using continuous 4-0 prolene suture. The anastomosis was flushed and the clamps removed.  Bentall Procedure:   The ascending aortic aneurysm was mobilized from the right pulmonary artery and main PA. It was opened longitudinally and the valve inspected. The dissection extended down to the annular level along the non-coronary and right coronary sinuses resulting in loss of suspension of the aortic valve leaflets with prolapse and severe AI. His sinuses were large and I did not think a resuspension repair was indicated. It was a trileaflet valve. The right and left coronary arteries were removed from the aortic root with a button of aortic wall around the ostia. The dissection extended down around the  right coronary ostium but did not occlude it. They were retracted carefully out of the way with stay sutures to prevent rotation. The native valve was excised taking care to remove all particulate debri. The annulus was sized and a 27 mm Edwards  KONECT RESILIA pericardial valved graft was chosen. ( Model # [id number], Serial # [id number]). A series of pledgetted 2-0 Ethibond horizontal mattress sutures were placed around the annulus with the pledgets in a sub-annular position. The sutures were placed through the valve sewing ring.  The valve was lowered into place and the sutures tied . The valve seated nicely.  Small openings were made in the graft for the coronary anastomoses using a thermal cautery. Then the left and right coronary buttons were anastomosed to the graft in an end to side manner using continuous 5-0 prolene suture. A light coating of Prevaleak was applied to each anastomosis for hemostasis. The two grafts were then cut to the appropriate length and anastomosed end to end using continuous 3-0 prolene suture. Prevaleak was applied to seal the needle holes in the grafts. A dose of warm retrograde reanimation cardioplegia was given. A vent cannula was placed into the graft to remove any air. Deairing maneuvers were performed and the bed placed in trendelenburg position.   Completion:   The patient was rewarmed to 37 degrees Centigrade. The crossclamp was removed with a time of 181 minutes. There was spontaneous return of sinus rhythm. The position of the grafts was satisfactory. Two temporary epicardial pacing wires were placed on the right atrium and two on the right ventricle. The patient was weaned from CPB without difficulty on Epi at 2 mcg, NE and vasopressin to support BP.  CPB time was 300 minutes. Cardiac output was 5 LPM. TEE showed a normal functioning aortic valve prosthesis with no AI. There was unchanged mild MR. LV function appeared normal. Heparin was fully reversed with protamine. The side arm graft was ligated with a heavy silk suture and suture ligated with a 3-0 Prolene pledgetted horizontal mattress suture. The patient has severe coagulopathy due to the hypothermic circulatory arrest and preop Effient use for his stenting  procedure. He was given multiple units of platelets, FFP and cryo as well as NovoSeven. Hemostasis was achieved. Mediastinal drainage tubes were placed. The sternum was closed with double #6 stainless steel wires. The fascia was closed with continuous # 1 vicryl suture. The subcutaneous tissue was closed with 2-0 vicryl continuous suture. The skin was closed with 3-0 vicryl subcuticular suture. The axillary graft was ligated in the same manner as the other grafts and the wound closed in layers. All sponge, needle, and instrument counts were reported correct at the end of the case. Dry sterile dressings were placed over the incisions and around the chest tubes which were connected to pleurevac suction. The patient was then transported to the surgical intensive care unit in critical but stable condition.

## 2024-02-02 NOTE — TOC Initial Note (Signed)
 Transition of Care [facility]) - Initial/Assessment Note    Patient Details  Name: Dustin Davenport MRN: [id number] Date of Birth: 20-Sep-1956  [facility]) CM/SW Contact:    Ernst Heap Phone Number: [phone] 02/02/2024, 10:06 AM  Clinical Narrative:    10:00 AM- HF CSW called and spoke with the patients wife [name] over the phone. [place] stated that they live together. Anette Kehr stated that the patient was driving prior to this recent hospitalization. Patient stated that he has no history of HH services. Ada Acres stated that the patient uses a cane occasionally at home. Anette Kehr stated that they have a scale at home. Ada Acres stated that the patient has a PCP. CSW explained that a hospital follow up appointment will be scheduled closer towards dc. [name] agrees and requested morning appts. Once patient is medically ready for dc spouse will provide transportation home.   TOC will continue following.               Expected Discharge Plan: Home/Self Care Barriers to Discharge: Continued Medical Work up   Patient Goals and CMS Choice            Expected Discharge Plan and Services       Living arrangements for the past 2 months: Single Family Home                                      Prior Living Arrangements/Services Living arrangements for the past 2 months: Single Family Home Lives with:: Spouse Patient language and need for interpreter reviewed:: Yes        Need for Family Participation in Patient Care: Yes (Comment) Care giver support system in place?: Yes (comment)   Criminal Activity/Legal Involvement Pertinent to Current Situation/Hospitalization: No - Comment as needed  Activities of Daily Living      Permission Sought/Granted                  Emotional Assessment         Alcohol / Substance Use: Not Applicable Psych Involvement: No (comment)  Admission diagnosis:  STEMI involving right coronary artery (HCC) [I21.11] S/P aortic  dissection repair [A21.308] Patient Active Problem List   Diagnosis Date Noted   S/P aortic dissection repair 02/02/2024   STEMI involving right coronary artery (HCC) 02/01/2024   PCP:  Annette Barters, MD Pharmacy:   [facility] [phone] [address], [place] - [address] AT [facility] [address] [place] [address] Phone: [phone] Fax: [phone]     Social Drivers of Health (SDOH) Social History:   SDOH Interventions:     Readmission Risk Interventions     No data to display

## 2024-02-02 NOTE — Progress Notes (Signed)
 Dr. Sherene Dilling notified of critical ABG, pt already on bicarb gtt 125cc/hr. Given verbal order to increase vent set rate from 16 to 20 rpm and to recheck ABG in 1 hr.   ABG    Component Value Date/Time   PHART 7.205 (L) 02/02/2024 0650   PCO2ART 50.6 (H) 02/02/2024 0650   PO2ART 62 (L) 02/02/2024 0650   HCO3 20.4 02/02/2024 0650   TCO2 22 02/02/2024 0650   ACIDBASEDEF 8.0 (H) 02/02/2024 0650   O2SAT 88 02/02/2024 0650

## 2024-02-03 ENCOUNTER — Inpatient Hospital Stay (HOSPITAL_COMMUNITY)

## 2024-02-03 DIAGNOSIS — I2111 ST elevation (STEMI) myocardial infarction involving right coronary artery: Secondary | ICD-10-CM | POA: Diagnosis not present

## 2024-02-03 DIAGNOSIS — I7101 Dissection of ascending aorta: Secondary | ICD-10-CM

## 2024-02-03 LAB — HEMOGLOBIN AND HEMATOCRIT, BLOOD
HCT: 25.2 % — ABNORMAL LOW (ref 39.0–52.0)
Hemoglobin: 8.4 g/dL — ABNORMAL LOW (ref 13.0–17.0)

## 2024-02-03 LAB — BPAM CRYOPRECIPITATE
Blood Product Expiration Date: 202504192359
Blood Product Expiration Date: 202504212359
Blood Product Expiration Date: 202504212359
Blood Product Expiration Date: 202504212359
Blood Product Expiration Date: 202504212359
ISSUE DATE / TIME: 202504160005
ISSUE DATE / TIME: 202504160056
ISSUE DATE / TIME: 202504160130
ISSUE DATE / TIME: 202504160250
ISSUE DATE / TIME: 202504160317
Unit Type and Rh: 1700
Unit Type and Rh: 5100
Unit Type and Rh: 6200
Unit Type and Rh: 9500
Unit Type and Rh: 9500

## 2024-02-03 LAB — DIC (DISSEMINATED INTRAVASCULAR COAGULATION)PANEL
D-Dimer, Quant: 4.99 ug{FEU}/mL — ABNORMAL HIGH (ref 0.00–0.50)
Fibrinogen: 230 mg/dL (ref 210–475)
INR: 1.5 — ABNORMAL HIGH (ref 0.8–1.2)
Platelets: 66 10*3/uL — ABNORMAL LOW (ref 150–400)
Prothrombin Time: 18 s — ABNORMAL HIGH (ref 11.4–15.2)
aPTT: 33 s (ref 24–36)

## 2024-02-03 LAB — CBC
HCT: 22.3 % — ABNORMAL LOW (ref 39.0–52.0)
Hemoglobin: 7.6 g/dL — ABNORMAL LOW (ref 13.0–17.0)
MCH: 27.7 pg (ref 26.0–34.0)
MCHC: 34.1 g/dL (ref 30.0–36.0)
MCV: 81.4 fL (ref 80.0–100.0)
Platelets: 66 10*3/uL — ABNORMAL LOW (ref 150–400)
RBC: 2.74 MIL/uL — ABNORMAL LOW (ref 4.22–5.81)
RDW: 17.9 % — ABNORMAL HIGH (ref 11.5–15.5)
WBC: 18.5 10*3/uL — ABNORMAL HIGH (ref 4.0–10.5)
nRBC: 0.2 % (ref 0.0–0.2)

## 2024-02-03 LAB — POCT I-STAT 7, (LYTES, BLD GAS, ICA,H+H)
Acid-Base Excess: 10 mmol/L — ABNORMAL HIGH (ref 0.0–2.0)
Acid-Base Excess: 9 mmol/L — ABNORMAL HIGH (ref 0.0–2.0)
Acid-Base Excess: 9 mmol/L — ABNORMAL HIGH (ref 0.0–2.0)
Bicarbonate: 34.3 mmol/L — ABNORMAL HIGH (ref 20.0–28.0)
Bicarbonate: 34.1 mmol/L — ABNORMAL HIGH (ref 20.0–28.0)
Bicarbonate: 35.1 mmol/L — ABNORMAL HIGH (ref 20.0–28.0)
Calcium, Ion: 1.03 mmol/L — ABNORMAL LOW (ref 1.15–1.40)
Calcium, Ion: 1.1 mmol/L — ABNORMAL LOW (ref 1.15–1.40)
Calcium, Ion: 1.07 mmol/L — ABNORMAL LOW (ref 1.15–1.40)
HCT: 19 % — ABNORMAL LOW (ref 39.0–52.0)
HCT: 21 % — ABNORMAL LOW (ref 39.0–52.0)
HCT: 22 % — ABNORMAL LOW (ref 39.0–52.0)
Hemoglobin: 6.5 g/dL — CL (ref 13.0–17.0)
Hemoglobin: 7.1 g/dL — ABNORMAL LOW (ref 13.0–17.0)
Hemoglobin: 7.5 g/dL — ABNORMAL LOW (ref 13.0–17.0)
O2 Saturation: 94 %
O2 Saturation: 92 %
O2 Saturation: 94 %
Patient temperature: 37.2
Patient temperature: 37
Potassium: 4.4 mmol/L (ref 3.5–5.1)
Potassium: 4.3 mmol/L (ref 3.5–5.1)
Potassium: 4.3 mmol/L (ref 3.5–5.1)
Sodium: 147 mmol/L — ABNORMAL HIGH (ref 135–145)
Sodium: 145 mmol/L (ref 135–145)
Sodium: 146 mmol/L — ABNORMAL HIGH (ref 135–145)
TCO2: 36 mmol/L — ABNORMAL HIGH (ref 22–32)
TCO2: 36 mmol/L — ABNORMAL HIGH (ref 22–32)
TCO2: 37 mmol/L — ABNORMAL HIGH (ref 22–32)
pCO2 arterial: 42.8 mmHg (ref 32–48)
pCO2 arterial: 50.1 mmHg — ABNORMAL HIGH (ref 32–48)
pCO2 arterial: 55.2 mmHg — ABNORMAL HIGH (ref 32–48)
pH, Arterial: 7.512 — ABNORMAL HIGH (ref 7.35–7.45)
pH, Arterial: 7.441 (ref 7.35–7.45)
pH, Arterial: 7.412 (ref 7.35–7.45)
pO2, Arterial: 65 mmHg — ABNORMAL LOW (ref 83–108)
pO2, Arterial: 64 mmHg — ABNORMAL LOW (ref 83–108)
pO2, Arterial: 72 mmHg — ABNORMAL LOW (ref 83–108)

## 2024-02-03 LAB — BPAM PLATELET PHERESIS
Blood Product Expiration Date: 202504172359
Blood Product Expiration Date: 202504192359
Blood Product Expiration Date: 202504192359
Blood Product Expiration Date: 202504192359
ISSUE DATE / TIME: 202504160130
ISSUE DATE / TIME: 202504160130
ISSUE DATE / TIME: 202504160242
ISSUE DATE / TIME: 202504160242
Unit Type and Rh: 5100
Unit Type and Rh: 5100
Unit Type and Rh: 5100
Unit Type and Rh: 7300

## 2024-02-03 LAB — PREPARE CRYOPRECIPITATE
Unit division: 0
Unit division: 0
Unit division: 0
Unit division: 0
Unit division: 0

## 2024-02-03 LAB — PREPARE FRESH FROZEN PLASMA: Unit division: 0

## 2024-02-03 LAB — GLUCOSE, CAPILLARY
Glucose-Capillary: 121 mg/dL — ABNORMAL HIGH (ref 70–99)
Glucose-Capillary: 129 mg/dL — ABNORMAL HIGH (ref 70–99)
Glucose-Capillary: 132 mg/dL — ABNORMAL HIGH (ref 70–99)
Glucose-Capillary: 133 mg/dL — ABNORMAL HIGH (ref 70–99)
Glucose-Capillary: 134 mg/dL — ABNORMAL HIGH (ref 70–99)
Glucose-Capillary: 137 mg/dL — ABNORMAL HIGH (ref 70–99)
Glucose-Capillary: 138 mg/dL — ABNORMAL HIGH (ref 70–99)
Glucose-Capillary: 139 mg/dL — ABNORMAL HIGH (ref 70–99)
Glucose-Capillary: 140 mg/dL — ABNORMAL HIGH (ref 70–99)
Glucose-Capillary: 145 mg/dL — ABNORMAL HIGH (ref 70–99)
Glucose-Capillary: 148 mg/dL — ABNORMAL HIGH (ref 70–99)
Glucose-Capillary: 149 mg/dL — ABNORMAL HIGH (ref 70–99)
Glucose-Capillary: 58 mg/dL — ABNORMAL LOW (ref 70–99)

## 2024-02-03 LAB — BASIC METABOLIC PANEL WITH GFR
Anion gap: 18 — ABNORMAL HIGH (ref 5–15)
BUN: 29 mg/dL — ABNORMAL HIGH (ref 8–23)
CO2: 31 mmol/L (ref 22–32)
Calcium: 7.9 mg/dL — ABNORMAL LOW (ref 8.9–10.3)
Chloride: 100 mmol/L (ref 98–111)
Creatinine, Ser: 2.73 mg/dL — ABNORMAL HIGH (ref 0.61–1.24)
GFR, Estimated: 25 mL/min — ABNORMAL LOW (ref >60)
Glucose, Bld: 132 mg/dL — ABNORMAL HIGH (ref 70–99)
Potassium: 4.5 mmol/L (ref 3.5–5.1)
Sodium: 149 mmol/L — ABNORMAL HIGH (ref 135–145)

## 2024-02-03 LAB — ECHOCARDIOGRAM COMPLETE
AV Mean grad: 3 mmHg
AV Peak grad: 5.4 mmHg
Ao pk vel: 1.16 m/s
Area-P 1/2: 4.21 cm2
Height: 73 in
Single Plane A4C EF: 50.9 %
Weight: 4758.41 [oz_av]

## 2024-02-03 LAB — BPAM FFP
Blood Product Expiration Date: 202504192359
Blood Product Expiration Date: 202504192359
ISSUE DATE / TIME: 202504160208
ISSUE DATE / TIME: 202504160208
Unit Type and Rh: 6200
Unit Type and Rh: 6200

## 2024-02-03 LAB — PREPARE PLATELET PHERESIS
Unit division: 0
Unit division: 0
Unit division: 0
Unit division: 0

## 2024-02-03 LAB — PREPARE RBC (CROSSMATCH)

## 2024-02-03 LAB — COOXEMETRY PANEL
Carboxyhemoglobin: 1.3 % (ref 0.5–1.5)
Methemoglobin: 0.3 % (ref 0.0–1.5)
O2 Saturation: 55.5 %
Total hemoglobin: 7.9 g/dL — ABNORMAL LOW (ref 12.0–16.0)

## 2024-02-03 LAB — AMMONIA: Ammonia: 19 umol/L (ref 9–35)

## 2024-02-03 LAB — SURGICAL PATHOLOGY

## 2024-02-03 LAB — CG4 I-STAT (LACTIC ACID): Lactic Acid, Venous: 3.2 mmol/L (ref 0.5–1.9)

## 2024-02-03 LAB — MAGNESIUM: Magnesium: 2.4 mg/dL (ref 1.7–2.4)

## 2024-02-03 MED ORDER — SODIUM CHLORIDE 0.9% IV SOLUTION: Freq: Once | INTRAVENOUS | Status: AC

## 2024-02-03 MED ORDER — FENTANYL BOLUS VIA INFUSION
50.0000 ug | INTRAVENOUS | Status: DC | PRN
  Administered 2024-02-03 – 2024-02-04 (×3): 100 ug via INTRAVENOUS

## 2024-02-03 MED ORDER — SENNOSIDES-DOCUSATE SODIUM 8.6-50 MG PO TABS
2.0000 | ORAL_TABLET | Freq: Two times a day (BID) | ORAL | Status: DC
  Administered 2024-02-03 (×2): 2
  Filled 2024-02-03 (×2): qty 2

## 2024-02-03 MED ORDER — DEXTROSE 5 % IV SOLN
120.0000 mg | Freq: Once | INTRAVENOUS | Status: AC
  Administered 2024-02-03: 120 mg via INTRAVENOUS
  Filled 2024-02-03: qty 10

## 2024-02-03 MED ORDER — LACTATED RINGERS IV SOLN: INTRAVENOUS | Status: AC

## 2024-02-03 MED ORDER — PANTOPRAZOLE SODIUM 40 MG IV SOLR
40.0000 mg | Freq: Every day | INTRAVENOUS | Status: DC
  Administered 2024-02-03 – 2024-02-26 (×24): 40 mg via INTRAVENOUS
  Filled 2024-02-03 (×24): qty 10

## 2024-02-03 MED ORDER — PERFLUTREN LIPID MICROSPHERE
1.0000 mL | INTRAVENOUS | Status: AC | PRN
  Administered 2024-02-03: 2 mL via INTRAVENOUS

## 2024-02-03 MED ORDER — FREE WATER: 200.0000 mL | Status: DC

## 2024-02-03 MED ORDER — FENTANYL 2500MCG IN NS 250ML (10MCG/ML) PREMIX INFUSION
50.0000 ug/h | INTRAVENOUS | Status: DC
  Administered 2024-02-03 (×2): 200 ug/h via INTRAVENOUS
  Filled 2024-02-03 (×2): qty 250

## 2024-02-03 NOTE — Progress Notes (Signed)
 Due to technical difficulty, lab verified results of Cooxemetry Panel at 0422 over the phone. Results were as follows: Total Hemoglobin: 7.9 Carboxyhemoglobin: 3 Methemoglobin: 0.3 O2 Saturation: 55.5%

## 2024-02-03 NOTE — Progress Notes (Signed)
   02/03/24 0913  Airway 8 mm  Placement Date/Time: 02/01/24 (c) 1743   Grade View: Grade 2  Airway Device: Endotracheal Tube  Laryngoscope Blade: MAC;4  ETT Types: Oral  Size (mm): 8 mm  Cuffed: Min.occ.pres.  Insertion attempts: 1  Airway Equipment: Stylet  Placement Confirmation...  Secured at (cm) (S)  26 cm (per CCM order)   No complications noted with advancement. RN at bedside for assistance.

## 2024-02-03 NOTE — Progress Notes (Addendum)
 2 Days Post-Op Procedure(s) (LRB): REPAIR OF ASCENDING AORTIC ANEURYSM USING A 32X12X10X10X10MM HEMASHIELD PLATINUM GRAFT. (N/A) ECHOCARDIOGRAM, TRANSESOPHAGEAL, INTRAOPERATIVE (N/A) BENTALL PROCEDURE USING A KONECT RESILIA AORTIC VALVE CONDUIT (N/A) Subjective:  Hemodynamically stable on Epi 2, NE 2, vaso 0.04 CI 2.34, Co-ox 55.5 with PaO2 of 65, sat 94% on 70% FiO2.  Remains sedated on vent. Reportedly wakes up very agitated, moving all extremities strongly but not following commands.  Objective: Vital signs in last 24 hours: Temp:  [95.7 F (35.4 C)-100.9 F (38.3 C)] 98.8 F (37.1 C) (04/17 0700) Pulse Rate:  [89-118] 89 (04/17 0700) Cardiac Rhythm: Normal sinus rhythm (04/17 0400) Resp:  [18-29] 20 (04/17 0700) BP: (96-102)/(47-49) 102/49 (04/17 0608) SpO2:  [88 %-97 %] 94 % (04/17 0700) Arterial Line BP: (83-148)/(36-71) 112/56 (04/17 0700) FiO2 (%):  [70 %] 70 % (04/17 0400) Weight:  [134.9 kg] 134.9 kg (04/17 0500)  Hemodynamic parameters for last 24 hours: PAP: (16-37)/(9-28) 29/18 CVP:  [4 mmHg-20 mmHg] 13 mmHg CO:  [5.3 L/min-6.2 L/min] 5.7 L/min CI:  [2.2 L/min/m2-2.54 L/min/m2] 2.3 L/min/m2  Intake/Output from previous day: 04/16 0701 - 04/17 0700 In: 5923.6 [I.V.:3915.5; Blood:320; NG/GT:60; IV Piggyback:1628.2] Out: 1745 [Urine:845; Chest Tube:900] Intake/Output this shift: No intake/output data recorded.  General appearance: intubated on vent. Neurologic: moves all ext by report but not following commands and agitated when he wakes up Heart: regular rate and rhythm, S1, S2 normal, no murmur Lungs: clear to auscultation bilaterally but decreased on left Abdomen: soft, obese, non-tender; no bowel sounds  Extremities: moderate anasarca, pedal pulses palpable. Arterial line pressures equal in upper extremities. Wound: dressings dry  Lab Results: Recent Labs    02/02/24 1657 02/02/24 2005 02/03/24 0413  WBC 16.1*  --  18.5*  HGB 8.4* 6.1* 7.6*   6.5*  HCT 23.8* 18.0* 22.3*  19.0*  PLT 85*  --  66*   BMET:  Recent Labs    02/02/24 1657 02/02/24 2005 02/03/24 0413  NA 148* 148* 149*  147*  K 3.5 3.4* 4.5  4.4  CL 103  --  100  CO2 30  --  31  GLUCOSE 125*  --  132*  BUN 23  --  29*  CREATININE 2.26*  --  2.73*  CALCIUM  8.3*  --  7.9*    PT/INR:  Recent Labs    02/02/24 0648  LABPROT 17.5*  INR 1.4*   ABG    Component Value Date/Time   PHART 7.512 (H) 02/03/2024 0413   HCO3 34.3 (H) 02/03/2024 0413   TCO2 36 (H) 02/03/2024 0413   ACIDBASEDEF 4.0 (H) 02/02/2024 0859   O2SAT 55.5 02/03/2024 0422   CBG (last 3)  Recent Labs    02/03/24 0155 02/03/24 0411 02/03/24 0645  GLUCAP 134* 137* 139*   CXR: interstitial edema, haziness on left that is likely pleural effusion.  Assessment/Plan: S/P Procedure(s) (LRB): REPAIR OF ASCENDING AORTIC ANEURYSM USING A 32X12X10X10X10MM HEMASHIELD PLATINUM GRAFT. (N/A) ECHOCARDIOGRAM, TRANSESOPHAGEAL, INTRAOPERATIVE (N/A) BENTALL PROCEDURE USING A KONECT RESILIA AORTIC VALVE CONDUIT (N/A)  Hemodynamics stable on current vasopressor support. Wean as tolerated. Normal LV systolic function with mild RV systolic dysfunction on TEE in OR.   AKI: due to combination of left kidney malperfusion seen on preop CTA related to dissection, preop cath and CTA dye, long pump run with circ arrest. UO 20/hr. May end up needing CRRT.  Hypoxemic respiratory failure requiring continued vent support. I have consulted CCM for help with management of this complex patient  with multisystem organ dysfunction.  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 This could be a significant problem for liver function. Unclear how perfusion is after surgery. Sometimes it improves and sometimes unchanged. His metabolic acidosis is corrected which hopefully is a good sign. Glucose has been ok.  CT output low.  Keep tubes in. He will need left pleural effusion drained. CCM will examine with US  and possibly place pigtail or else I will place CT later.  Thrombocytopenia: likely related to consumption in dissection false lumen, surgery, Heparin . Follow for now since not bleeding.  Preop inferior STEMI with extensive DES to RCA. Received Effient  pre cath. Can't be on DAPT at this time.  LOS: 2 days    Bartley Lightning 02/03/2024  Addendum: I discussed clinical status and plans with wife and daughter in waiting room and answered all of their questions. They are aware of his continued critical condition.

## 2024-02-03 NOTE — Progress Notes (Signed)
 Echocardiogram 2D Echocardiogram has been performed.  Dustin Davenport 02/03/2024, 1:56 PM

## 2024-02-03 NOTE — Progress Notes (Addendum)
 Advanced Heart Failure Rounding Note  Cardiologist: None  Chief Complaint: STEMI> Cardiogenic shock> Now s/p aortic dissection repair + Bentall Subjective:   Now s/p aortic dissection repair + Bentall 4/15.   Currently on vaso 0.04, levo @2  and epi @ 2  Remains intubated and sedated. Moves all 4 extremities but does not follow commands at this time.   Swann #s PA 29/18 (23) CO/CI 5.68/2.34 CVP 13 PAPi .84 SVR 831 Significant events:   - Echo 4/15: AAA measuring 5-5.7 cm. Followed by emergent TEE 4/15 which was significant for torrential aortic regurgitation and dissection flap extending from the aortic root to the proximal ascending aorta  - Emergent TEE 4/15: significant for torrential aortic regurgitation and dissection flap extending from the aortic root to the proximal ascending aorta  - CT C/A/P: Showed acute type A dissection with a 5.5 cm ascending aortic aneurysm extending into arch. Dissection goes down to infrarenal aorta and extends up into innominate and left common carotid arterie  - Taken to OR 4/15: Now s/p aortic dissection repair 02/02/24 (Bentall/AVR and reimplantation of coronaries. Intra-op course c/b coagulopathy due to Effient (PCI of RCA earlier in the day)).   Objective:   Weight Range: 134.9 kg Body mass index is 39.24 kg/m.   Vital Signs:   Temp:  [95.7 F (35.4 C)-100.9 F (38.3 C)] 98.8 F (37.1 C) (04/17 0700) Pulse Rate:  [89-118] 89 (04/17 0700) Resp:  [18-29] 20 (04/17 0700) BP: (96-102)/(47-49) 102/49 (04/17 0608) SpO2:  [88 %-97 %] 94 % (04/17 0700) Arterial Line BP: (83-148)/(36-71) 112/56 (04/17 0700) FiO2 (%):  [70 %] 70 % (04/17 0400) Weight:  [134.9 kg] 134.9 kg (04/17 0500) Last BM Date :  (Pre-Op)  Weight change: Filed Weights   02/01/24 0932 02/02/24 0700 02/03/24 0500  Weight: 120.2 kg 129.1 kg 134.9 kg   Intake/Output:   Intake/Output Summary (Last 24 hours) at 02/03/2024 1610 Last data filed at 02/03/2024 0700 Gross  per 24 hour  Intake 5923.64 ml  Output 1745 ml  Net 4178.64 ml    Physical Exam   General:  intubated and sedated HEENT: +ETT +OG Neck: supple. JVD ~12 cm. Swann RIJ Cor: PMI nondisplaced. Regular rate & rhythm. No rubs, gallops or murmurs.+Y CT. +EPW.  Lungs: clear Abdomen: obese, soft, nontender, nondistended. active bowel sounds. Extremities: no cyanosis, clubbing, rash, edema  GU: +foley Neuro: sedated, not following commands  Telemetry   NSR 90s (Personally reviewed)    EKG   No new EKG to review  Labs    CBC Recent Labs    02/01/24 0935 02/01/24 0949 02/02/24 1657 02/02/24 2005 02/03/24 0413  WBC 7.2   < > 16.1*  --  18.5*  NEUTROABS 4.2  --   --   --   --   HGB 15.2   < > 8.4* 6.1* 7.6*  6.5*  HCT 45.0   < > 23.8* 18.0* 22.3*  19.0*  MCV 88.8   < > 80.7  --  81.4  PLT 142*   < > 85*  --  66*   < > = values in this interval not displayed.   Basic Metabolic Panel Recent Labs    96/04/54 1657 02/02/24 2005 02/03/24 0413  NA 148* 148* 149*  147*  K 3.5 3.4* 4.5  4.4  CL 103  --  100  CO2 30  --  31  GLUCOSE 125*  --  132*  BUN 23  --  29*  CREATININE  2.26*  --  2.73*  CALCIUM 8.3*  --  7.9*  MG 2.4  --  2.4   Liver Function Tests Recent Labs    02/01/24 0935 02/01/24 1151  AST 28 158*  ALT 23 34  ALKPHOS 51 51  BILITOT 0.7 0.7  PROT 6.5 6.3*  ALBUMIN 3.6 3.6   No results for input(s): "LIPASE", "AMYLASE" in the last 72 hours. Cardiac Enzymes No results for input(s): "CKTOTAL", "CKMB", "CKMBINDEX", "TROPONINI" in the last 72 hours.  BNP: BNP (last 3 results) No results for input(s): "BNP" in the last 8760 hours.  ProBNP (last 3 results) No results for input(s): "PROBNP" in the last 8760 hours.   D-Dimer No results for input(s): "DDIMER" in the last 72 hours. Hemoglobin A1C Recent Labs    02/01/24 1151  HGBA1C 5.3   Fasting Lipid Panel Recent Labs    02/01/24 0935  CHOL 160  HDL 31*  LDLCALC 93  TRIG 811*  CHOLHDL  5.2   Thyroid Function Tests No results for input(s): "TSH", "T4TOTAL", "T3FREE", "THYROIDAB" in the last 72 hours.  Invalid input(s): "FREET3"  Other results:   Imaging    No results found.    Medications:     Scheduled Medications:  sodium chloride   Intravenous Once   sodium chloride   Intravenous Once   acetaminophen  1,000 mg Per Tube Q6H   Or   acetaminophen (TYLENOL) oral liquid 160 mg/5 mL  1,000 mg Per Tube Q6H   atorvastatin  80 mg Oral Daily   bisacodyl  10 mg Oral Daily   Or   bisacodyl  10 mg Rectal Daily   Chlorhexidine Gluconate Cloth  6 each Topical Daily   docusate sodium  200 mg Oral Daily   metoCLOPramide (REGLAN) injection  10 mg Intravenous Q6H   mupirocin ointment  1 Application Nasal BID   mouth rinse  15 mL Mouth Rinse Q2H   [START ON 02/04/2024] pantoprazole  40 mg Oral Daily   pantoprazole (PROTONIX) IV  40 mg Intravenous QHS   sodium chloride flush  10-40 mL Intracatheter Q12H   sodium chloride flush  3 mL Intravenous Q12H   sodium chloride flush  3-10 mL Intravenous Q12H    Infusions:  sodium chloride     albumin human Stopped (02/02/24 0851)   amiodarone 30 mg/hr (02/03/24 0700)    ceFAZolin (ANCEF) IV Stopped (02/03/24 0542)   dexmedetomidine (PRECEDEX) IV infusion 0.7 mcg/kg/hr (02/03/24 0700)   epinephrine 2 mcg/min (02/03/24 0700)   fentaNYL infusion INTRAVENOUS 200 mcg/hr (02/03/24 0700)   insulin 4.6 Units/hr (02/03/24 0700)   lactated ringers     lactated ringers 10 mL/hr at 02/03/24 0700   nitroGLYCERIN Stopped (02/02/24 0739)   norepinephrine (LEVOPHED) Adult infusion 2 mcg/min (02/03/24 0700)   vasopressin 0.04 Units/min (02/03/24 0700)    PRN Medications: albumin human, dextrose, midazolam, morphine injection, ondansetron (ZOFRAN) IV, mouth rinse, oxyCODONE, sodium chloride flush, sodium chloride flush, sodium chloride flush, traMADol  Patient Profile   Dustin Davenport is a 68 y.o. male with HTN and HLD. Admitted with  STEMI now with CGS. Found to have AAA and taken emergently to the OR. Now s/p Bentall/AVR and reimplantation of coronaries.  Assessment/Plan  Acute type A aortic dissection  - Echo 4/15: AAA measuring 5-5.7 cm - Emergent TEE 4/15: significant for torrential aortic regurgitation and dissection flap extending from the aortic root to the proximal ascending aorta  - CT C/A/P: Showed acute type A dissection with  a 5.5 cm ascending aortic aneurysm extending into arch. Dissection goes down to infrarenal aorta and extends up into innominate and left common carotid arterie  - Followed by TCTS, taken to OR 4/15 - Now s/p aortic dissection repair 02/02/24 (Bentall/AVR and reimplantation of coronaries. Intra-op course c/b coagulopathy due to Effient (PCI of RCA earlier in the day)) - Received 6uPRBCs, 6 plt, 4 FFP and 5 cryo intra-op.   - Follow H&H. Keep hgb >8. 7.6 this morning. Got 1uPRBCs yesterday. Getting another unit this morning.  - Persistent wide pulse pressure  Cardiogenic shock - Post perfusion CGS with wide pulse pressure - Echo 4/15: reviewed with Dr. Gasper Lloyd: EF 55-60%, RV mild-mod reduced - Co-ox 56%. Can consider adding DBA - Volume overloaded. + 4L overnight. UOP has slowed down. Plan for possible CRRT today vs tomorrow. Nephrology has been consulted. Otherwise would push diuresis today and follow response. Will discuss with team.  - Lactic acid last 3.6. Check today.  - Continue pressor support as above, target SBP >90.  - Vent management per PCCM.    CAD, STEMI - LHC with single vessel occlusive CAD involving the mid to distal RCA, successful PCI of the RCA with overlapping DES x 3. - Holding ASA   - Hold statin (AST 158 02/01/24). LDL 93. Goal <70 - CMET tomorrow   HTN - Persistent wide pulse pressure - Pressor support as above  A fib PVCs - Patient with a fib intra-op.  - Continue amiodarone gtt - Now in NSR with PVCs  - Keep K>4 and Mg >2  - Eventual heparin  gtt  Hypokalemia - resolved. 4.5 today  AKI - SCr up to 2.73 today.  - Baseline SCr ~1.3-1.4 - Nephrology has been consulted. Plan for possible CRRT/ diuresis today - Net + 16L post-op and weight up ~30lbs.  - Avoid hypotension  ABLA post-op - Hgb 7.6 this morning - Received 6uPRBCs, 6 plt, 4 FFP and 5 cryo intra-op.   - Follow H&H. Keep hgb >8.  - Got 1uPRBCs yesterday. Getting another unit this morning.  - Check DIC panel  Addendum: Discussed with Dr. Laneta Simmers and Dr. Gala Romney. Will try 120 IV lasix x1 and follow response.    CRITICAL CARE Performed by: Alen Bleacher  Total critical care time: 17 minutes  Critical care time was exclusive of separately billable procedures and treating other patients.  Critical care was necessary to treat or prevent imminent or life-threatening deterioration.  Critical care was time spent personally by me on the following activities: development of treatment plan with patient and/or surrogate as well as nursing, discussions with consultants, evaluation of patient's response to treatment, examination of patient, obtaining history from patient or surrogate, ordering and performing treatments and interventions, ordering and review of laboratory studies, ordering and review of radiographic studies, pulse oximetry and re-evaluation of patient's condition.     Length of Stay: 2  Alen Bleacher, NP  02/03/2024, 7:12 AM  Advanced Heart Failure Team Pager [phone] (M-F; 7a - 5p)  Please contact [facility] for night-coverage after hours (5p -7a ) and weekends on [email or web address]  Agree with above.   Remains intubated and sedated. He is agitated when sedation lightened. No evidence on going bleeding.   Hemodynamics stable on vaso 0.04, levo @2  and epi @ 2 (Swan numbers reviewed personally)  He is volume overloaded but has AKI (Scr 2.7). Weight up 30 pounds  Echo EF 20-25% RV moderately down.   General:  intubated/sedated HEENT:+  ETT Neck: supple. +  swan [id number] dressing and CTs ok .  Lungs: clear anteriorly Abdomen: obese soft hypoactive BS Extremities: no cyanosis, clubbing, rash, 2-3+ edema Neuro: intubated/sedated  Hemodynamics improved but still markedly volume overloaded. Weight up nearly 30 pounds. D/w Dr. Sherene Dilling. Will trial high-dose lasix. If not working will likely need CVVHD tomorrow. Continue current support. Leave intubated for now.   CRITICAL CARE Performed by: Jules Oar  Total critical care time: 52 minutes  Critical care time was exclusive of separately billable procedures and treating other patients.  Critical care was necessary to treat or prevent imminent or life-threatening deterioration.  Critical care was time spent personally by me (independent of midlevel providers or residents) on the following activities: development of treatment plan with patient and/or surrogate as well as nursing, discussions with consultants, evaluation of patient's response to treatment, examination of patient, obtaining history from patient or surrogate, ordering and performing treatments and interventions, ordering and review of laboratory studies, ordering and review of radiographic studies, pulse oximetry and re-evaluation of patient's condition.  Jules Oar, MD  5:50 PM

## 2024-02-03 NOTE — Progress Notes (Signed)
 Initial Nutrition Assessment  DOCUMENTATION CODES:   Obesity unspecified  INTERVENTION:   Initiate tube feeding when medically feasible:  Vital 1.5 @ 10 ml and increase by 10 ml every 8 hours until goal of 65 ml/hr  (1560 ml per day) Prosource TF20 60 ml TID  Provides 2580 kcal, 165 gm protein, 1192 ml free water daily  Rena-Vite daily   NUTRITION DIAGNOSIS:   Increased nutrient needs related to acute illness as evidenced by estimated needs.   GOAL:   Patient will meet greater than or equal to 90% of their needs   MONITOR:   Vent status, Labs, I & O's, Diet advancement, TF tolerance  REASON FOR ASSESSMENT:   Ventilator    ASSESSMENT:  68 y.o Male with history of HTN, HLD, who presents with acute inferior STEMI.  4/16 - s/p RCA stent, aortic dissection repair and Bentall placement    No family at bedside. History obtained through chart review.  Patient presented 4/15 for complaints of sudden onset chest pain. EKG showed inferior STEMI and underwent RCA stent in cath lab. Post procedure still had ongoing chest pain, CTA showed aortic valve failure. S/p Bentall and ascending /arch replacement. Ongoing attempts at weaning resulted in agitation, heavily sedated.   Per medical team, expected prolonged intubation. Patient is volume overloaded and UOP slowing, team trying lasix first. May need CRRT today or tomorrow if lasix fails. No tube feeds today but MD ok with placement of cortrak tomorrow.   Patient is currently intubated on ventilator support MV: 8.4 L/min Temp (24hrs), Avg:100.1 F (37.8 C), Min:98.2 F (36.8 C), Max:101.1 F (38.4 C)  MAP (a-line): 67mmHg  Propofol: Discontinued   Admit weight: 120.2 kg   Current weight: 134.9 kg    Intake/Output Summary (Last 24 hours) at 02/03/2024 1543 Last data filed at 02/03/2024 1400 Gross per 24 hour  Intake 4379.96 ml  Output 1265 ml  Net 3114.96 ml   Net IO Since Admission: 16,970.95 mL [02/03/24  1543]  Drains/Lines: Chest tube - 4/16 900 x 24 hours UOP - 845 ml x 24 hours   Nutritionally Relevant Medications: Scheduled Meds:  metoCLOPramide (REGLAN) injection  10 mg Intravenous Q6H   mupirocin ointment  1 Application Nasal BID   mouth rinse  15 mL Mouth Rinse Q2H   pantoprazole (PROTONIX) IV  40 mg Intravenous QHS   senna-docusate  2 tablet Per Tube BID   Continuous Infusions:  sodium chloride     albumin human Stopped (02/02/24 0851)   amiodarone 30 mg/hr (02/03/24 1400)    ceFAZolin (ANCEF) IV 200 mL/hr at 02/03/24 1400   dexmedetomidine (PRECEDEX) IV infusion 0.7 mcg/kg/hr (02/03/24 1421)   epinephrine 2 mcg/min (02/03/24 1400)   fentaNYL infusion INTRAVENOUS 200 mcg/hr (02/03/24 1400)   insulin 5 Units/hr (02/03/24 1400)   norepinephrine (LEVOPHED) Adult infusion 2 mcg/min (02/03/24 1400)   vasopressin 0.04 Units/min (02/03/24 1400)   Labs Reviewed: Sodium 149, BUN 29, Creatinine 2.73, Calcium 7.9, GFR 25. Hgb 8.4, lactic acid 3.2 CBG ranges from 114-148 mg/dL over the last 24 hours HgbA1c 5.3  NUTRITION - FOCUSED PHYSICAL EXAM: - RD attempted x 2 to do exam, patient occupied with medical team both times.   Diet Order:   Diet Order             Diet NPO time specified  Diet effective now                   EDUCATION NEEDS:   Not  appropriate for education at this time  Skin:  Skin Assessment: Skin Integrity Issues: Skin Integrity Issues:: Incisions Incisions: Chest incisions  Last BM:  02/01/24, type 5, medium  Height:   Ht Readings from Last 1 Encounters:  02/01/24 6\' 1"  (1.854 m)    Weight:   Wt Readings from Last 1 Encounters:  02/03/24 134.9 kg    Ideal Body Weight:  83.6 kg  BMI:  Body mass index is 39.24 kg/m.  Estimated Nutritional Needs:   Kcal:  2400-2600 kcal  Protein:  160-180 gm  Fluid:  >2L/day   Frederik Jansky, RD Registered Dietitian  See Amion for more information

## 2024-02-03 NOTE — Consult Note (Addendum)
 Dustin Davenport, MRN:  [id number], DOB:  26-Nov-1955, LOS: 2 ADMISSION DATE:  02/01/2024, CONSULTATION DATE:  02/03/24 REFERRING MD:  Laneta Simmers, CHIEF COMPLAINT:  chest pain   History of Present Illness:  68 year old man w/ hx of HTN, obesity who presented on 4/15 with sudden onset chest pain.  EKG showing inferior STEMI and went to cath lab for RCA DES x 3.  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  Taken emergently to OR for Bentall+ ascending/arch replacement.  Procedure complicated by coagulopathy related to antiplatelet therapy from recent stent, multiple liters of blood products given.  Returned to ICU 4/16 on vent and PCCM consulted to assist with vent wean on 4/17.  Currently heavily sedated; attempts at weaning resulted in agitation without ability to redirect.  Pertinent  Medical History   Past Medical History:  Diagnosis Date   Hypertension      Significant Hospital Events: Including procedures, antibiotic start and stop dates in addition to other pertinent events   4/16 RCA stent then Bentall, aortic arch replacement  Interim History / Subjective:  consult  Objective   Blood pressure (!) 102/49, pulse 89, temperature 98.8 F (37.1 C), resp. rate 20, height 6\' 1"  (1.854 m), weight 134.9 kg, SpO2 94%. PAP: (16-36)/(9-24) 29/18 CVP:  [4 mmHg-20 mmHg] 13 mmHg CO:  [5.3 L/min-6.2 L/min] 5.7 L/min CI:  [2.2 L/min/m2-2.54 L/min/m2] 2.3 L/min/m2  Vent Mode: SIMV;PRVC;PSV FiO2 (%):  [70 %] 70 % Set Rate:  [20 bmp] 20 bmp Vt Set:  [630 mL] 630 mL PEEP:  [5 cmH20] 5 cmH20 Pressure Support:  [10 cmH20] 10 cmH20 Plateau Pressure:  [15 cmH20-19 cmH20] 18 cmH20   Intake/Output Summary (Last 24 hours) at 02/03/2024 0730 Last data filed at 02/03/2024 0700 Gross per 24 hour  Intake 5923.64 ml  Output 1695 ml  Net 4228.64 ml   Filed Weights   02/01/24 0932 02/02/24 0700 02/03/24 0500   Weight: 120.2 kg 129.1 kg 134.9 kg    Examination: General: no distress heavily sedated HENT: pupils small, equal Lungs: distant, driving pressures are around 12 Cardiovascular: distant, ext warm Abdomen: protuberant, hypoactive BS Extremities: +anasarca Neuro: flails all 4 ext with sedation wean GU: foley clear urine  Cr drifting up Getting some more blood this am, plts also dropping  CXR developing L effusion suspected  Resolved Hospital Problem list   N/A  Assessment & Plan:  Concurrent inferior STEMI and ascending aortic dissection requiring RCA stent followed by emergent aortic reconstruction and aortic root replacement 4/16.  Involvement of renal artery noted. Postoperative ventilator management Postoperative acute kidney injury Postoperative encephalopathy Postoperative ABLA, coagulopathy Postoperative vasoplegic and hemorrhagic shock Hx HTN, obesity  - Abx ppx, drain management per primary - [place] left chest, pigtail if fluid (Update: no fluid, will re-US tomorrow) - Avoid nephrotoxins, high risk for progression to renal replacement need - Vent bundle, may need more PEEP given fluid status and body habitus, limit driving pressures - Sedation for vent synchrony - Advance ett 4cm - Avoid hypothermia, acidemia - Antiplatelet resumption at some point - Levo, vaso for MAP 65  Best Practice (right click and "Reselect all SmartList Selections" daily)   Diet/type: NPO DVT prophylaxis SCD Pressure ulcer(s): N/A GI prophylaxis: PPI Lines: Central line Foley:  Yes, and it is still needed Code Status:  full code Last date of multidisciplinary goals of care discussion [updated by TCTS]  Labs   CBC: Recent Labs  Lab 02/01/24 0935 02/01/24 0949 02/02/24 0300 02/02/24 0305 02/02/24 0648 02/02/24 0650 02/02/24 1208 02/02/24 1210 02/02/24 1603 02/02/24 1657 02/02/24 2005 02/03/24 0413  WBC 7.2   < > 14.0*  --  12.7*  --  12.7*  --   --  16.1*  --  18.5*   NEUTROABS 4.2  --   --   --   --   --   --   --   --   --   --   --   HGB 15.2   < > 8.7*   < > 9.4*   < > 7.8* 6.8* 7.1* 8.4* 6.1* 7.6*  6.5*  HCT 45.0   < > 26.5*   < > 29.4*   < > 23.0* 20.0* 21.0* 23.8* 18.0* 22.3*  19.0*  MCV 88.8   < > 93.6  --  87.5  --  83.6  --   --  80.7  --  81.4  PLT 142*   < > 177  --  101*  --  92*  --   --  85*  --  66*   < > = values in this interval not displayed.    Basic Metabolic Panel: Recent Labs  Lab 02/01/24 0935 02/01/24 0949 02/01/24 1151 02/01/24 1736 02/02/24 0430 02/02/24 0457 02/02/24 0546 02/02/24 0650 02/02/24 1208 02/02/24 1210 02/02/24 1603 02/02/24 1657 02/02/24 2005 02/03/24 0413  NA 143   < > 142   < > 150*   < > 152*   < > 149* 149* 149* 148* 148* 149*  147*  K 3.1*   < > 3.7   < > 3.1*   < > 3.5   < > 3.3* 3.1* 3.2* 3.5 3.4* 4.5  4.4  CL 109   < > 106   < > 107  --  108  --  105  --   --  103  --  100  CO2 20*  --  26  --   --   --   --   --  25  --   --  30  --  31  GLUCOSE 161*   < > 145*   < > 230*  --  208*  --  234*  --   --  125*  --  132*  BUN 15   < > 16   < > 18  --  16  --  21  --   --  23  --  29*  CREATININE 1.30*   < > 1.45*   < > 1.40*  --  1.40*  --  2.16*  --   --  2.26*  --  2.73*  CALCIUM 9.2  --  9.1  --   --   --   --   --  8.4*  --   --  8.3*  --  7.9*  MG  --   --  1.9  --   --   --   --   --  2.7*  --   --  2.4  --  2.4   < > = values in this interval not displayed.   GFR: Estimated Creatinine Clearance: 37.3 mL/min (A) (by C-G formula based on SCr of 2.73 mg/dL (H)). Recent Labs  Lab 02/01/24 0949 02/01/24 1021 02/01/24 1151 02/01/24 1401 02/02/24 0135 02/02/24 0648 02/02/24 1208 02/02/24 1657 02/03/24 0413  WBC  --   --  18.1*  --    < > 12.7* 12.7* 16.1* 18.5*  LATICACIDVEN 4.4* 1.7 3.8* 3.6*  --   --   --   --   --    < > = values in this interval not displayed.    Liver Function Tests: Recent Labs  Lab 02/01/24 0935 02/01/24 1151  AST 28 158*  ALT 23 34  ALKPHOS 51 51   BILITOT 0.7 0.7  PROT 6.5 6.3*  ALBUMIN 3.6 3.6   No results for input(s): "LIPASE", "AMYLASE" in the last 168 hours. No results for input(s): "AMMONIA" in the last 168 hours.  ABG    Component Value Date/Time   PHART 7.512 (H) 02/03/2024 0413   PCO2ART 42.8 02/03/2024 0413   PO2ART 65 (L) 02/03/2024 0413   HCO3 34.3 (H) 02/03/2024 0413   TCO2 36 (H) 02/03/2024 0413   ACIDBASEDEF 4.0 (H) 02/02/2024 0859   O2SAT 55.5 02/03/2024 0422     Coagulation Profile: Recent Labs  Lab 02/01/24 0935 02/02/24 0135 02/02/24 0300 02/02/24 0648  INR 1.3* 1.5* 1.3* 1.4*    Cardiac Enzymes: No results for input(s): "CKTOTAL", "CKMB", "CKMBINDEX", "TROPONINI" in the last 168 hours.  HbA1C: Hgb A1c MFr Bld  Date/Time Value Ref Range Status  02/01/2024 11:51 AM 5.3 4.8 - 5.6 % Final    Comment:    (NOTE) Pre diabetes:          5.7%-6.4%  Diabetes:              >6.4%  Glycemic control for   <7.0% adults with diabetes   02/01/2024 10:00 AM 5.4 4.8 - 5.6 % Final    Comment:    (NOTE) Pre diabetes:          5.7%-6.4%  Diabetes:              >6.4%  Glycemic control for   <7.0% adults with diabetes     CBG: Recent Labs  Lab 02/02/24 2303 02/03/24 0054 02/03/24 0155 02/03/24 0411 02/03/24 0645  GLUCAP 134* 138* 134* 137* 139*    Review of Systems:   Intubated/sedated  Past Medical History:  He,  has a past medical history of Hypertension.   Surgical History:   Past Surgical History:  Procedure Laterality Date   BENTALL PROCEDURE N/A 02/01/2024   Procedure: BENTALL PROCEDURE USING A KONECT RESILIA AORTIC VALVE CONDUIT;  Surgeon: Bartley Lightning, MD;  Location: [facility] OR;  Service: Open Heart Surgery;  Laterality: N/A;   CORONARY/GRAFT ACUTE MI REVASCULARIZATION N/A 02/01/2024   Procedure: Coronary/Graft Acute MI Revascularization;  Surgeon: [place], Peter M, MD;  Location: [facility] INVASIVE CV LAB;  Service: Cardiovascular;  Laterality: N/A;   INTRAOPERATIVE  TRANSESOPHAGEAL ECHOCARDIOGRAM N/A 02/01/2024   Procedure: ECHOCARDIOGRAM, TRANSESOPHAGEAL, INTRAOPERATIVE;  Surgeon: Bartley Lightning, MD;  Location: [facility] OR;  Service: Open Heart Surgery;  Laterality: N/A;   THORACIC AORTIC ANEURYSM REPAIR N/A 02/01/2024   Procedure: REPAIR OF ASCENDING AORTIC ANEURYSM USING A [id number] HEMASHIELD PLATINUM GRAFT.;  Surgeon: Bartley Lightning, MD;  Location: [facility] OR;  Service: Open Heart Surgery;  Laterality: N/A;  Repair of Aortic Dissection, Right Axillary Cannulation.     Social History:      Family History:  His family history is not on file.   Allergies Allergies  Allergen Reactions   Bee Venom      Home Medications  Prior to Admission medications   Medication Sig Start Date End Date Taking? Authorizing Provider  acetaminophen (TYLENOL) 500 MG  tablet Take 1,000 mg by mouth 2 (two) times daily as needed for mild pain (pain score 1-3), moderate pain (pain score 4-6), fever or headache.   Yes [provider]  chlorthalidone (HYGROTON) 25 MG tablet Take 25 mg by mouth daily.   Yes [provider]  losartan (COZAAR) 100 MG tablet Take 50 mg by mouth daily. 11/22/23  Yes [provider]  metoprolol succinate (TOPROL-XL) 100 MG 24 hr tablet Take 50 mg by mouth daily. Take with or immediately following a meal.   Yes [provider]  potassium chloride (KLOR-CON) 10 MEQ tablet Take 10 mEq by mouth daily. 01/17/24  Yes [provider]     Critical care time: 33 mins independent of procedures

## 2024-02-04 ENCOUNTER — Inpatient Hospital Stay (HOSPITAL_COMMUNITY)

## 2024-02-04 DIAGNOSIS — I7101 Dissection of ascending aorta: Secondary | ICD-10-CM | POA: Diagnosis not present

## 2024-02-04 DIAGNOSIS — N179 Acute kidney failure, unspecified: Secondary | ICD-10-CM | POA: Diagnosis not present

## 2024-02-04 DIAGNOSIS — I4891 Unspecified atrial fibrillation: Secondary | ICD-10-CM

## 2024-02-04 DIAGNOSIS — I2111 ST elevation (STEMI) myocardial infarction involving right coronary artery: Secondary | ICD-10-CM | POA: Diagnosis not present

## 2024-02-04 DIAGNOSIS — R570 Cardiogenic shock: Secondary | ICD-10-CM | POA: Diagnosis not present

## 2024-02-04 LAB — POCT I-STAT 7, (LYTES, BLD GAS, ICA,H+H)
Acid-Base Excess: 9 mmol/L — ABNORMAL HIGH (ref 0.0–2.0)
Acid-Base Excess: 5 mmol/L — ABNORMAL HIGH (ref 0.0–2.0)
Bicarbonate: 34.4 mmol/L — ABNORMAL HIGH (ref 20.0–28.0)
Bicarbonate: 29.9 mmol/L — ABNORMAL HIGH (ref 20.0–28.0)
Calcium, Ion: 1.06 mmol/L — ABNORMAL LOW (ref 1.15–1.40)
Calcium, Ion: 1.05 mmol/L — ABNORMAL LOW (ref 1.15–1.40)
HCT: 22 % — ABNORMAL LOW (ref 39.0–52.0)
HCT: 19 % — ABNORMAL LOW (ref 39.0–52.0)
Hemoglobin: 7.5 g/dL — ABNORMAL LOW (ref 13.0–17.0)
Hemoglobin: 6.5 g/dL — CL (ref 13.0–17.0)
O2 Saturation: 97 %
O2 Saturation: 93 %
Patient temperature: 36.6
Patient temperature: 37.6
Potassium: 4.2 mmol/L (ref 3.5–5.1)
Potassium: 3.8 mmol/L (ref 3.5–5.1)
Sodium: 146 mmol/L — ABNORMAL HIGH (ref 135–145)
Sodium: 145 mmol/L (ref 135–145)
TCO2: 36 mmol/L — ABNORMAL HIGH (ref 22–32)
TCO2: 31 mmol/L (ref 22–32)
pCO2 arterial: 51.4 mmHg — ABNORMAL HIGH (ref 32–48)
pCO2 arterial: 45.2 mmHg (ref 32–48)
pH, Arterial: 7.432 (ref 7.35–7.45)
pH, Arterial: 7.431 (ref 7.35–7.45)
pO2, Arterial: 85 mmHg (ref 83–108)
pO2, Arterial: 69 mmHg — ABNORMAL LOW (ref 83–108)

## 2024-02-04 LAB — CBC
HCT: 24.8 % — ABNORMAL LOW (ref 39.0–52.0)
HCT: 21.6 % — ABNORMAL LOW (ref 39.0–52.0)
HCT: 24.3 % — ABNORMAL LOW (ref 39.0–52.0)
Hemoglobin: 8.2 g/dL — ABNORMAL LOW (ref 13.0–17.0)
Hemoglobin: 7 g/dL — ABNORMAL LOW (ref 13.0–17.0)
Hemoglobin: 7.8 g/dL — ABNORMAL LOW (ref 13.0–17.0)
MCH: 28.6 pg (ref 26.0–34.0)
MCH: 28.1 pg (ref 26.0–34.0)
MCH: 28 pg (ref 26.0–34.0)
MCHC: 33.1 g/dL (ref 30.0–36.0)
MCHC: 32.4 g/dL (ref 30.0–36.0)
MCHC: 32.1 g/dL (ref 30.0–36.0)
MCV: 86.4 fL (ref 80.0–100.0)
MCV: 86.7 fL (ref 80.0–100.0)
MCV: 87.1 fL (ref 80.0–100.0)
Platelets: 71 10*3/uL — ABNORMAL LOW (ref 150–400)
Platelets: 67 10*3/uL — ABNORMAL LOW (ref 150–400)
Platelets: 79 10*3/uL — ABNORMAL LOW (ref 150–400)
RBC: 2.87 MIL/uL — ABNORMAL LOW (ref 4.22–5.81)
RBC: 2.49 MIL/uL — ABNORMAL LOW (ref 4.22–5.81)
RBC: 2.79 MIL/uL — ABNORMAL LOW (ref 4.22–5.81)
RDW: 18.1 % — ABNORMAL HIGH (ref 11.5–15.5)
RDW: 18.4 % — ABNORMAL HIGH (ref 11.5–15.5)
RDW: 17.4 % — ABNORMAL HIGH (ref 11.5–15.5)
WBC: 24.1 10*3/uL — ABNORMAL HIGH (ref 4.0–10.5)
WBC: 20.3 10*3/uL — ABNORMAL HIGH (ref 4.0–10.5)
WBC: 20.4 10*3/uL — ABNORMAL HIGH (ref 4.0–10.5)
nRBC: 0.4 % — ABNORMAL HIGH (ref 0.0–0.2)
nRBC: 0.8 % — ABNORMAL HIGH (ref 0.0–0.2)
nRBC: 1.4 % — ABNORMAL HIGH (ref 0.0–0.2)

## 2024-02-04 LAB — COMPREHENSIVE METABOLIC PANEL WITH GFR
ALT: 49 U/L — ABNORMAL HIGH (ref 0–44)
AST: 283 U/L — ABNORMAL HIGH (ref 15–41)
Albumin: 2.6 g/dL — ABNORMAL LOW (ref 3.5–5.0)
Alkaline Phosphatase: 38 U/L (ref 38–126)
Anion gap: 11 (ref 5–15)
BUN: 41 mg/dL — ABNORMAL HIGH (ref 8–23)
CO2: 32 mmol/L (ref 22–32)
Calcium: 7.6 mg/dL — ABNORMAL LOW (ref 8.9–10.3)
Chloride: 104 mmol/L (ref 98–111)
Creatinine, Ser: 3.28 mg/dL — ABNORMAL HIGH (ref 0.61–1.24)
GFR, Estimated: 20 mL/min — ABNORMAL LOW (ref >60)
Glucose, Bld: 129 mg/dL — ABNORMAL HIGH (ref 70–99)
Potassium: 4.3 mmol/L (ref 3.5–5.1)
Sodium: 147 mmol/L — ABNORMAL HIGH (ref 135–145)
Total Bilirubin: 0.5 mg/dL (ref 0.0–1.2)
Total Protein: 4.5 g/dL — ABNORMAL LOW (ref 6.5–8.1)

## 2024-02-04 LAB — COOXEMETRY PANEL
Carboxyhemoglobin: 1.5 % (ref 0.5–1.5)
Methemoglobin: <0.7 % (ref 0.0–1.5)
O2 Saturation: 62 %
Total hemoglobin: 8 g/dL — ABNORMAL LOW (ref 12.0–16.0)

## 2024-02-04 LAB — MAGNESIUM: Magnesium: 2.4 mg/dL (ref 1.7–2.4)

## 2024-02-04 LAB — CG4 I-STAT (LACTIC ACID): Lactic Acid, Venous: 4.1 mmol/L (ref 0.5–1.9)

## 2024-02-04 LAB — GLUCOSE, CAPILLARY
Glucose-Capillary: 117 mg/dL — ABNORMAL HIGH (ref 70–99)
Glucose-Capillary: 119 mg/dL — ABNORMAL HIGH (ref 70–99)
Glucose-Capillary: 120 mg/dL — ABNORMAL HIGH (ref 70–99)
Glucose-Capillary: 124 mg/dL — ABNORMAL HIGH (ref 70–99)
Glucose-Capillary: 127 mg/dL — ABNORMAL HIGH (ref 70–99)
Glucose-Capillary: 130 mg/dL — ABNORMAL HIGH (ref 70–99)
Glucose-Capillary: 131 mg/dL — ABNORMAL HIGH (ref 70–99)
Glucose-Capillary: 131 mg/dL — ABNORMAL HIGH (ref 70–99)
Glucose-Capillary: 136 mg/dL — ABNORMAL HIGH (ref 70–99)
Glucose-Capillary: 146 mg/dL — ABNORMAL HIGH (ref 70–99)

## 2024-02-04 LAB — BASIC METABOLIC PANEL WITH GFR
Anion gap: 15 (ref 5–15)
BUN: 46 mg/dL — ABNORMAL HIGH (ref 8–23)
CO2: 30 mmol/L (ref 22–32)
Calcium: 8 mg/dL — ABNORMAL LOW (ref 8.9–10.3)
Chloride: 101 mmol/L (ref 98–111)
Creatinine, Ser: 3.32 mg/dL — ABNORMAL HIGH (ref 0.61–1.24)
GFR, Estimated: 19 mL/min — ABNORMAL LOW (ref >60)
Glucose, Bld: 135 mg/dL — ABNORMAL HIGH (ref 70–99)
Potassium: 4.3 mmol/L (ref 3.5–5.1)
Sodium: 146 mmol/L — ABNORMAL HIGH (ref 135–145)

## 2024-02-04 LAB — PREPARE RBC (CROSSMATCH)

## 2024-02-04 MED ORDER — ACETAMINOPHEN 10 MG/ML IV SOLN
1000.0000 mg | Freq: Once | INTRAVENOUS | Status: AC
  Administered 2024-02-04: 1000 mg via INTRAVENOUS
  Filled 2024-02-04: qty 100

## 2024-02-04 MED ORDER — METOLAZONE 5 MG PO TABS: 5.0000 mg | ORAL_TABLET | Freq: Once | ORAL | Status: DC

## 2024-02-04 MED ORDER — FUROSEMIDE 10 MG/ML IJ SOLN
120.0000 mg | Freq: Four times a day (QID) | INTRAVENOUS | Status: DC
  Administered 2024-02-04: 120 mg via INTRAVENOUS
  Filled 2024-02-04: qty 10
  Filled 2024-02-04: qty 12

## 2024-02-04 MED ORDER — SODIUM CHLORIDE 0.9% IV SOLUTION: Freq: Once | INTRAVENOUS | Status: AC

## 2024-02-04 MED ORDER — DEXMEDETOMIDINE HCL IN NACL 400 MCG/100ML IV SOLN
0.0000 ug/kg/h | INTRAVENOUS | Status: DC
  Administered 2024-02-04: 1.2 ug/kg/h via INTRAVENOUS
  Administered 2024-02-04: 1.4 ug/kg/h via INTRAVENOUS
  Administered 2024-02-04 (×2): 1.2 ug/kg/h via INTRAVENOUS
  Administered 2024-02-05: 1.3 ug/kg/h via INTRAVENOUS
  Administered 2024-02-05 (×2): 1 ug/kg/h via INTRAVENOUS
  Administered 2024-02-05: 0.8 ug/kg/h via INTRAVENOUS
  Administered 2024-02-05: 1.2 ug/kg/h via INTRAVENOUS
  Administered 2024-02-05: 1.1 ug/kg/h via INTRAVENOUS
  Administered 2024-02-05: 1 ug/kg/h via INTRAVENOUS
  Administered 2024-02-06: 1.2 ug/kg/h via INTRAVENOUS
  Administered 2024-02-06: 0.9 ug/kg/h via INTRAVENOUS
  Administered 2024-02-06: 1.4 ug/kg/h via INTRAVENOUS
  Administered 2024-02-06 (×2): 1.3 ug/kg/h via INTRAVENOUS
  Administered 2024-02-06: 0.8 ug/kg/h via INTRAVENOUS
  Administered 2024-02-06: 1 ug/kg/h via INTRAVENOUS
  Administered 2024-02-06: 0.6 ug/kg/h via INTRAVENOUS
  Administered 2024-02-07: 1.3 ug/kg/h via INTRAVENOUS
  Administered 2024-02-07 (×7): 1.4 ug/kg/h via INTRAVENOUS
  Administered 2024-02-07: 1.3 ug/kg/h via INTRAVENOUS
  Administered 2024-02-07: 1.2 ug/kg/h via INTRAVENOUS
  Administered 2024-02-08: 1.5 ug/kg/h via INTRAVENOUS
  Administered 2024-02-08: 1.2 ug/kg/h via INTRAVENOUS
  Administered 2024-02-08: 1.5 ug/kg/h via INTRAVENOUS
  Administered 2024-02-08 (×3): 1.2 ug/kg/h via INTRAVENOUS
  Administered 2024-02-08: 1.5 ug/kg/h via INTRAVENOUS
  Administered 2024-02-08: 1.2 ug/kg/h via INTRAVENOUS
  Administered 2024-02-08: 1.5 ug/kg/h via INTRAVENOUS
  Administered 2024-02-08 – 2024-02-09 (×9): 1.2 ug/kg/h via INTRAVENOUS
  Filled 2024-02-04 (×48): qty 100

## 2024-02-04 MED ORDER — ALBUMIN HUMAN 25 % IV SOLN
25.0000 g | Freq: Four times a day (QID) | INTRAVENOUS | Status: AC
  Administered 2024-02-04: 25 g via INTRAVENOUS
  Administered 2024-02-04: 12.5 g via INTRAVENOUS
  Administered 2024-02-04 – 2024-02-05 (×2): 25 g via INTRAVENOUS
  Filled 2024-02-04 (×4): qty 100

## 2024-02-04 MED ORDER — FENTANYL CITRATE PF 50 MCG/ML IJ SOSY
12.5000 ug | PREFILLED_SYRINGE | INTRAMUSCULAR | Status: DC | PRN
  Administered 2024-02-04: 25 ug via INTRAVENOUS
  Administered 2024-02-04 (×4): 50 ug via INTRAVENOUS
  Administered 2024-02-04: 12.5 ug via INTRAVENOUS
  Administered 2024-02-05 (×5): 50 ug via INTRAVENOUS
  Filled 2024-02-04 (×12): qty 1

## 2024-02-04 MED ORDER — POTASSIUM CHLORIDE 10 MEQ/50ML IV SOLN
10.0000 meq | INTRAVENOUS | Status: AC
  Administered 2024-02-04 (×4): 10 meq via INTRAVENOUS
  Filled 2024-02-04 (×4): qty 50

## 2024-02-04 MED ORDER — CALCIUM GLUCONATE-NACL 1-0.675 GM/50ML-% IV SOLN
1.0000 g | Freq: Once | INTRAVENOUS | Status: AC
  Administered 2024-02-04: 1000 mg via INTRAVENOUS
  Filled 2024-02-04: qty 50

## 2024-02-04 MED ORDER — ACETAMINOPHEN 10 MG/ML IV SOLN
1000.0000 mg | Freq: Four times a day (QID) | INTRAVENOUS | Status: AC
  Administered 2024-02-04 – 2024-02-05 (×2): 1000 mg via INTRAVENOUS
  Filled 2024-02-04 (×2): qty 100

## 2024-02-04 MED ORDER — METHYLNALTREXONE BROMIDE 12 MG/0.6ML ~~LOC~~ SOLN
12.0000 mg | Freq: Once | SUBCUTANEOUS | Status: AC
  Administered 2024-02-04: 12 mg via SUBCUTANEOUS
  Filled 2024-02-04: qty 0.6

## 2024-02-04 MED ORDER — INSULIN ASPART 100 UNIT/ML IJ SOLN
0.0000 [IU] | INTRAMUSCULAR | Status: DC
  Administered 2024-02-04 – 2024-02-05 (×8): 3 [IU] via SUBCUTANEOUS

## 2024-02-04 MED ORDER — ASPIRIN 325 MG PO TABS: 325.0000 mg | ORAL_TABLET | Freq: Every day | ORAL | Status: DC

## 2024-02-04 MED ORDER — INSULIN GLARGINE-YFGN 100 UNIT/ML ~~LOC~~ SOLN
25.0000 [IU] | Freq: Two times a day (BID) | SUBCUTANEOUS | Status: DC
  Administered 2024-02-04 – 2024-02-05 (×3): 25 [IU] via SUBCUTANEOUS
  Filled 2024-02-04 (×4): qty 0.25

## 2024-02-04 MED ORDER — FUROSEMIDE 10 MG/ML IJ SOLN
120.0000 mg | Freq: Four times a day (QID) | INTRAVENOUS | Status: AC
  Administered 2024-02-04 (×2): 120 mg via INTRAVENOUS
  Filled 2024-02-04 (×2): qty 2

## 2024-02-04 MED ORDER — DEXTROSE 5 % IV SOLN
500.0000 mg | Freq: Once | INTRAVENOUS | Status: AC
  Administered 2024-02-04: 500 mg via INTRAVENOUS
  Filled 2024-02-04: qty 17.8

## 2024-02-04 MED FILL — Heparin Sodium (Porcine) Inj 1000 Unit/ML: Qty: 1000 | Status: AC

## 2024-02-04 MED FILL — Lidocaine HCl Local Preservative Free (PF) Inj 2%: INTRAMUSCULAR | Qty: 14 | Status: AC

## 2024-02-04 MED FILL — Potassium Chloride Inj 2 mEq/ML: INTRAVENOUS | Qty: 20 | Status: AC

## 2024-02-04 NOTE — Progress Notes (Signed)
      [address]       [place] [address]             [phone]      POD # 3  Still confused   BP (!) 120/53   Pulse 86   Temp 100.2 F (37.9 C)   Resp (!) 23   Ht 6\' 1"  (1.854 m)   Wt 134.8 kg   SpO2 93%   BMI 39.21 kg/m   25/10 CI 3.17   Intake/Output Summary (Last 24 hours) at 02/04/2024 1654 Last data filed at 02/04/2024 1600 Gross per 24 hour  Intake 3270.71 ml  Output 3290 ml  Net -19.29 ml   Transfused for Hgb 6.5 earlier  [place] C. Luna Salinas, MD [facility] [phone]

## 2024-02-04 NOTE — Procedures (Signed)
 Extubation Procedure Note  Patient Details:   Name: Dustin Davenport DOB: 1956-03-03 MRN: [id number]   Airway Documentation:    Vent end date: 02/04/24 Vent end time: 0800   Evaluation  O2 sats: stable throughout Complications: No apparent complications Patient did tolerate procedure well. Bilateral Breath Sounds: Diminished   No  Pt was extubated and placed on HHFNC 35L/60% per CCM MD. Cuff leak was noted prior to extubation and no stridor post. Pt is comfortable and tolerating HHFNC well at this time.RT will monitor.   Petr Bontempo 02/04/2024, 8:18 AM

## 2024-02-04 NOTE — Anesthesia Postprocedure Evaluation (Signed)
 Anesthesia Post Note  Patient: Dustin Davenport  Procedure(s) Performed: REPAIR OF ASCENDING AORTIC ANEURYSM USING A 32X12X10X10X10MM HEMASHIELD PLATINUM GRAFT. (Chest) ECHOCARDIOGRAM, TRANSESOPHAGEAL, INTRAOPERATIVE (Chest) BENTALL PROCEDURE USING A KONECT RESILIA AORTIC VALVE CONDUIT (Chest)     Patient location during evaluation: ICU Anesthesia Type: General Level of consciousness: sedated Pain management: pain level controlled Vital Signs Assessment: post-procedure vital signs reviewed and stable Respiratory status: patient remains intubated per anesthesia plan and patient on ventilator - see flowsheet for VS Cardiovascular status: stable Postop Assessment: no apparent nausea or vomiting Anesthetic complications: no   There were no known notable events for this encounter.  Last Vitals:  Vitals:   02/04/24 0930 02/04/24 0945  BP:    Pulse: 76 76  Resp: 14 (!) 21  Temp: 37.3 C 37.3 C  SpO2: 96% 95%    Last Pain:  Vitals:   02/04/24 0800  TempSrc: Core  PainSc:                  Leslye Rast

## 2024-02-04 NOTE — Progress Notes (Signed)
   Dustin Davenport, MRN:  [id number], DOB:  [birth year], LOS: 3 ADMISSION DATE:  02/01/2024, CONSULTATION DATE:  02/03/24 REFERRING MD:  Lucas, CHIEF COMPLAINT:  chest pain   History of Present Illness:  68 year old man w/ hx of HTN, obesity who presented on 4/15 with sudden onset chest pain.  EKG showing inferior STEMI and went to cath lab for RCA DES x 3.  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  Taken emergently to OR for Bentall+ ascending/arch replacement.  Procedure complicated by coagulopathy related to antiplatelet therapy from recent stent, multiple liters of blood products given.  Returned to ICU 4/16 on vent and PCCM consulted to assist with vent wean on 4/17.  Currently heavily sedated; attempts at weaning resulted in agitation without ability to redirect.  Pertinent  Medical History   Past Medical History:  Diagnosis Date   Hypertension      Significant Hospital Events: Including procedures, antibiotic start and stop dates in addition to other pertinent events   4/16 RCA stent then Bentall, aortic arch replacement  Interim History / Subjective:  No events, sedated on minimal pressors.  Objective   Blood pressure (!) 120/53, pulse 73, temperature 97.9 F (36.6 C), resp. rate 14, height 6' 1 (1.854 m), weight 134.8 kg, SpO2 97%. PAP: (25-64)/(13-46) 33/17 CVP:  [0 mmHg-40 mmHg] 9 mmHg CO:  [5.1 L/min-6.9 L/min] 5.1 L/min CI:  [2.1 L/min/m2-2.8 L/min/m2] 2.1 L/min/m2  Vent Mode: SIMV;PSV;PRVC FiO2 (%):  [60 %] 60 % Set Rate:  [14 bmp] 14 bmp Vt Set:  [630 mL] 630 mL PEEP:  [8 cmH20] 8 cmH20 Pressure Support:  [10 cmH20] 10 cmH20 Plateau Pressure:  [15 cmH20-20 cmH20] 20 cmH20   Intake/Output Summary (Last 24 hours) at 02/04/2024 0711 Last data filed at 02/04/2024 0600 Gross per 24 hour  Intake 2911.15 ml  Output 1875 ml  Net 1036.15 ml   Filed Weights   02/02/24 0700  02/03/24 0500 02/04/24 0600  Weight: 129.1 kg 134.9 kg 134.8 kg    Examination: No distress Pupils small equal +developing anasarca Sternotomy dressed, minimal drain output Abd soft but not much BS Ext warm x 4 Foley clear urine  Resolved Hospital Problem list   N/A  Assessment & Plan:  Concurrent inferior STEMI and ascending aortic dissection requiring RCA stent followed by emergent aortic reconstruction and aortic root replacement 4/16.  Involvement of renal artery noted. Postoperative ventilator management Postoperative acute kidney injury Postoperative encephalopathy Postoperative ABLA, coagulopathy Postoperative vasoplegic and hemorrhagic shock Hx HTN, obesity  - Abx ppx, drain management per primary - Avoid nephrotoxins, high risk for progression to renal replacement need; push diuresis today - Wean to extubate - Would not be surprised if develops ileus, need to be cautious with this - Will need good deal of rehab - Will follow until stable off vent  Best Practice (right click and Reselect all SmartList Selections daily)   Diet/type: NPO DVT prophylaxis per TCTS Pressure ulcer(s): N/A GI prophylaxis: PPI Lines: Central line Foley:  Yes, and it is still needed Code Status:  full code Last date of multidisciplinary goals of care discussion [updated by TCTS]  31 min cc time Dustin Davenport

## 2024-02-04 NOTE — Progress Notes (Addendum)
 Nutrition Follow-up  DOCUMENTATION CODES:   Obesity unspecified  INTERVENTION:   When PO diet advanced, recommend add PO supplements to maximize intake of protein: Ensure Enlive po TID, each supplement provides 350 kcal and 20 grams of protein. If unable to safely begin PO diet, recommend Cortrak or NG tube placement and initiation of TF: Osmolite 1.5 with goal rate of 60 ml/h with Prosource TF20 60 ml QID to provide 2480 kcal, 170 gm protein, 1097 ml free water  daily.  NUTRITION DIAGNOSIS:   Increased nutrient needs related to acute illness as evidenced by estimated needs.  Ongoing   GOAL:   Patient will meet greater than or equal to 90% of their needs  Unmet   MONITOR:   Vent status, Labs, I & O's, Diet advancement, TF tolerance  REASON FOR ASSESSMENT:   Ventilator    ASSESSMENT:   68 y.o Male with history of HTN, HLD, who presents with acute inferior STEMI.  Patient was extubated this morning.  Cortrak was ordered, then discontinued per Cortrak RD discussion with RN. Currently NPO.  Will hopefully be able to begin PO diet soon.   I/O +16.4 L since admission. May require CRRT for volume removal. Weight is up from 120.2 kg on admission to 134.8 kg today. UOP 1445 ml x 24 h, 1400 ml x 8 h today. Chest tube output 430 ml x 24 h, 790 ml x 8 h today  Labs reviewed.  CBG: [phone]  Medications reviewed and include novolog , semglee , precedex , epinephrine , IV lasix , levophed , potassium chloride , vasopressin .  Diet Order:   Diet Order             Diet NPO time specified  Diet effective now                   EDUCATION NEEDS:   Not appropriate for education at this time  Skin:  Skin Assessment: Skin Integrity Issues: Skin Integrity Issues:: Incisions Incisions: Chest incisions  Last BM:  02/01/24, type 5, medium  Height:   Ht Readings from Last 1 Encounters:  02/01/24 6\' 1"  (1.854 m)    Weight:   Wt Readings from Last 1 Encounters:   02/04/24 134.8 kg    Ideal Body Weight:  83.6 kg  BMI:  Body mass index is 39.21 kg/m.  Estimated Nutritional Needs:   Kcal:  2400-2600 kcal  Protein:  160-180 gm  Fluid:  >2L/day   Barnet Boots RD, LDN, CNSC Contact via secure chat. If unavailable, use group chat "RD Inpatient."

## 2024-02-04 NOTE — TOC Progression Note (Signed)
 Transition of Care [facility]) - Progression Note    Patient Details  Name: Dustin Davenport MRN: [id number] Date of Birth: 08-23-1956  [facility]) CM/SW Contact  Justina Delcia Czar, RN Phone Number: [phone] 02/04/2024, 5:40 PM  Clinical Narrative:     TOC CM spoke to pt's wife at bedside. States he was independent pta. Has cane at home.   Will continue to follow for dc needs.   Expected Discharge Plan: Home w Home Health Services Barriers to Discharge: Continued Medical Work up  Expected Discharge Plan and Services   Discharge Planning Services: CM Consult   Living arrangements for the past 2 months: Single Family Home                                       Social Determinants of Health (SDOH) Interventions SDOH Screenings   Housing: Patient Unable To Answer (02/02/2024)  Transportation Needs: Patient Unable To Answer (02/02/2024)  Utilities: Patient Unable To Answer (02/02/2024)  Social Connections: Patient Unable To Answer (02/02/2024)    Readmission Risk Interventions     No data to display

## 2024-02-04 NOTE — Progress Notes (Addendum)
 Advanced Heart Failure Rounding Note  Cardiologist: None  Chief Complaint: STEMI> Cardiogenic shock> Now s/p aortic dissection repair + Bentall Subjective:   Now s/p aortic dissection repair + Bentall 4/15.   Coox 62%. CVP 15. On Epi 2 + VP 0.04 1.5L UOP (net + 1.1L) with Lasix  120 mg bid. Extubated to HFNC. Following commands.   Swan #s CVP 15 PA 36/16 CO/CI 5.6/2.3 CO/CI (fick) 8.8/3.3 CVP 15 PAPi 1.33  Significant events:    - Echo 4/15: AAA measuring 5-5.7 cm. Followed by emergent TEE 4/15 which was significant for torrential aortic regurgitation and dissection flap extending from the aortic root to the proximal ascending aorta  - Emergent TEE 4/15: significant for torrential aortic regurgitation and dissection flap extending from the aortic root to the proximal ascending aorta  - CT C/A/P: Showed acute type A dissection with a 5.5 cm ascending aortic aneurysm extending into arch. Dissection goes down to infrarenal aorta and extends up into innominate and left common carotid arterie  - Taken to OR 4/15: Now s/p aortic dissection repair 02/02/24 (Bentall/AVR and reimplantation of coronaries. Intra-op course c/b coagulopathy due to Effient  (PCI of RCA earlier in the day)).   Objective:    Weight Range: 134.8 kg Body mass index is 39.21 kg/m.   Vital Signs:   Temp:  [97.9 F (36.6 C)-99.1 F (37.3 C)] 99.1 F (37.3 C) (04/18 0945) Pulse Rate:  [68-85] 76 (04/18 0945) Resp:  [0-29] 21 (04/18 0945) BP: (119-127)/(52-57) 120/53 (04/18 0313) SpO2:  [84 %-98 %] 95 % (04/18 0945) Arterial Line BP: (62-161)/(41-100) 62/55 (04/18 0945) FiO2 (%):  [40 %-60 %] 60 % (04/18 0800) Weight:  [134.8 kg] 134.8 kg (04/18 0600) Last BM Date :  (PTA)  Weight change: Filed Weights   02/02/24 0700 02/03/24 0500 02/04/24 0600  Weight: 129.1 kg 134.9 kg 134.8 kg   Intake/Output:  Intake/Output Summary (Last 24 hours) at 02/04/2024 1012 Last data filed at 02/04/2024 1000 Gross per  24 hour  Intake 2590.98 ml  Output 2365 ml  Net 225.98 ml    Physical Exam   CVP 15 General: Critically ill appearing. No distress on RA Cardiac: S1 and S2 present. No murmurs or rub. Extremities: Warm and dry.  2+ BLE edema.  Neuro: Alert and oriented x3. Affect pleasant. Moves all extremities without difficulty. Lines/Devices:  L rad aline, R brachial aline, RIJ sheath + PAC, foley, CTx2  Telemetry   SR 70-80s, in AF RVR from 0100-0200 (personally reviewed)  EKG   No new EKG to review  Labs    CBC Recent Labs    02/03/24 0413 02/03/24 0806 02/03/24 0823 02/04/24 0429 02/04/24 0617  WBC 18.5*  --   --  24.1*  --   HGB 7.6*  6.5*  --    < > 8.2* 7.5*  HCT 22.3*  19.0*  --    < > 24.8* 22.0*  MCV 81.4  --   --  86.4  --   PLT 66* 66*  --  71*  --    < > = values in this interval not displayed.   Basic Metabolic Panel Recent Labs    16/10/96 0413 02/03/24 0823 02/04/24 0428 02/04/24 0429 02/04/24 0617  NA 149*  147*   < >  --  147* 146*  K 4.5  4.4   < >  --  4.3 4.2  CL 100  --   --  104  --   CO2 31  --   --  32  --   GLUCOSE 132*  --   --  129*  --   BUN 29*  --   --  41*  --   CREATININE 2.73*  --   --  3.28*  --   CALCIUM  7.9*  --   --  7.6*  --   MG 2.4  --  2.4  --   --    < > = values in this interval not displayed.   Liver Function Tests Recent Labs    02/01/24 1151 02/04/24 0429  AST 158* 283*  ALT 34 49*  ALKPHOS 51 38  BILITOT 0.7 0.5  PROT 6.3* 4.5*  ALBUMIN  3.6 2.6*   D-Dimer Recent Labs    02/03/24 0806  DDIMER 4.99*   Hemoglobin A1C Recent Labs    02/01/24 1151  HGBA1C 5.3   Imaging   ECHOCARDIOGRAM COMPLETE Result Date: 02/03/2024    ECHOCARDIOGRAM REPORT   Patient Name:   Dustin Davenport Date of Exam: 02/03/2024 Medical Rec #:  [id number]    Height:       73.0 in Accession #:    [id number]   Weight:       297.4 lb Date of Birth:  07/21/1956    BSA:          2.547 m Patient Age:    67 years     BP:           102/49  mmHg Patient Gender: M            HR:           89 bpm. Exam Location:  Inpatient Procedure: 2D Echo, Cardiac Doppler, Color Doppler and Intracardiac            Opacification Agent (Both Spectral and Color Flow Doppler were            utilized during procedure). Indications:    Dissection of thoracic aorta I71.01  History:        Patient has prior history of Echocardiogram examinations, most                 recent 02/01/2024. Aneurysm of the Ascending Aorta 52cm.  Sonographer:    Terrilee Few RCS Sonographer#2:  Raynelle Callow RDCS Referring Phys: [id number] ALMA L DIAZ IMPRESSIONS  1. Left ventricular ejection fraction, by estimation, is 20 to 25%. The left ventricle has severely decreased function. The left ventricle demonstrates global hypokinesis. Left ventricular diastolic parameters are indeterminate.  2. Right ventricular systolic function is moderately reduced. The right ventricular size is moderately enlarged.  3. The mitral valve is normal in structure. Trivial mitral valve regurgitation. No evidence of mitral stenosis.  4. The aortic valve has been repaired/replaced. Aortic valve regurgitation is not visualized. No aortic stenosis is present.  5. The inferior vena cava is dilated in size with <50% respiratory variability, suggesting right atrial pressure of 15 mmHg. Conclusion(s)/Recommendation(s): Technically very limitesd study due to poor sound wave transmission. FINDINGS  Left Ventricle: Left ventricular ejection fraction, by estimation, is 20 to 25%. The left ventricle has severely decreased function. The left ventricle demonstrates global hypokinesis. Definity  contrast agent was given IV to delineate the left ventricular endocardial borders. The left ventricular internal cavity size was normal in size. There is no left ventricular hypertrophy. Left ventricular diastolic parameters are indeterminate. Right Ventricle: The right ventricular size is moderately enlarged. No increase in right ventricular wall  thickness. Right ventricular systolic function is moderately reduced. Left Atrium:  Left atrial size was normal in size. Right Atrium: Right atrial size was normal in size. Pericardium: Trivial pericardial effusion is present. The pericardial effusion is lateral to the left ventricle. Mitral Valve: The mitral valve is normal in structure. Trivial mitral valve regurgitation. No evidence of mitral valve stenosis. Tricuspid Valve: The tricuspid valve is not well visualized. Tricuspid valve regurgitation is not demonstrated. No evidence of tricuspid stenosis. Aortic Valve: The aortic valve has been repaired/replaced. Aortic valve regurgitation is not visualized. No aortic stenosis is present. Aortic valve mean gradient measures 3.0 mmHg. Aortic valve peak gradient measures 5.4 mmHg. Pulmonic Valve: The pulmonic valve was normal in structure. Pulmonic valve regurgitation is not visualized. No evidence of pulmonic stenosis. Aorta: The aortic root is normal in size and structure. Venous: The inferior vena cava is dilated in size with less than 50% respiratory variability, suggesting right atrial pressure of 15 mmHg. IAS/Shunts: No atrial level shunt detected by color flow Doppler.   LV Volumes (MOD) LV vol d, MOD A4C: 181.0 ml Diastology LV vol s, MOD A4C: 88.8 ml  LV e' medial:    8.27 cm/s LV SV MOD A4C:     181.0 ml LV E/e' medial:  6.8                             LV e' lateral:   9.90 cm/s                             LV E/e' lateral: 5.7  RIGHT VENTRICLE         IVC TAPSE (M-mode): 1.6 cm  IVC diam: 2.20 cm LEFT ATRIUM             Index        RIGHT ATRIUM           Index LA Vol (A2C):   20.2 ml 7.93 ml/m   RA Area:     20.80 cm LA Vol (A4C):   43.7 ml 17.16 ml/m  RA Volume:   54.30 ml  21.32 ml/m LA Biplane Vol: 30.0 ml 11.78 ml/m  AORTIC VALVE AV Vmax:           116.00 cm/s AV Vmean:          78.800 cm/s AV VTI:            0.202 m AV Peak Grad:      5.4 mmHg AV Mean Grad:      3.0 mmHg LVOT Vmax:         99.70  cm/s LVOT Vmean:        65.300 cm/s LVOT VTI:          0.163 m LVOT/AV VTI ratio: 0.81 MITRAL VALVE MV Area (PHT): 4.21 cm    SHUNTS MV Decel Time: 180 msec    Systemic VTI: 0.16 m MV E velocity: 56.60 cm/s MV A velocity: 65.00 cm/s MV E/A ratio:  0.87 Jules Oar MD Electronically signed by Jules Oar MD Signature Date/Time: 02/03/2024/1:59:24 PM    Final    Medications:    Scheduled Medications:  sodium chloride    Intravenous Once   acetaminophen   1,000 mg Per Tube Q6H   Or   acetaminophen  (TYLENOL ) oral liquid 160 mg/5 mL  1,000 mg Per Tube Q6H   aspirin   325 mg Per Tube Daily   Chlorhexidine  Gluconate Cloth  6 each Topical Daily  insulin  aspart  0-20 Units Subcutaneous Q4H   insulin  glargine-yfgn  25 Units Subcutaneous BID   methylnaltrexone   12 mg Subcutaneous Once   mupirocin  ointment  1 Application Nasal BID   mouth rinse  15 mL Mouth Rinse Q2H   pantoprazole  (PROTONIX ) IV  40 mg Intravenous QHS   sodium chloride  flush  10-40 mL Intracatheter Q12H   sodium chloride  flush  3 mL Intravenous Q12H   sodium chloride  flush  3-10 mL Intravenous Q12H   Infusions:  albumin  human Stopped (02/04/24 0913)   amiodarone  30 mg/hr (02/04/24 1000)   chlorothiazide  (DIURIL ) 500 mg in dextrose  5 % 50 mL IVPB     dexmedetomidine  (PRECEDEX ) IV infusion 0.6 mcg/kg/hr (02/04/24 1007)   epinephrine  2 mcg/min (02/04/24 1000)   fentaNYL  infusion INTRAVENOUS Stopped (02/04/24 0744)   furosemide      lactated ringers  10 mL/hr at 02/04/24 1000   norepinephrine  (LEVOPHED ) Adult infusion 2 mcg/min (02/04/24 1000)   vasopressin  0.04 Units/min (02/04/24 1000)   PRN Medications: fentaNYL , ondansetron  (ZOFRAN ) IV, mouth rinse, sodium chloride  flush, sodium chloride  flush, sodium chloride  flush  Patient Profile   Dustin Davenport is a 68 y.o. male with HTN and HLD. Admitted with STEMI now with CGS. Found to have AAA and taken emergently to the OR. Now s/p Bentall/AVR and reimplantation of  coronaries.  Assessment/Plan   Acute type A aortic dissection  - Echo 4/15: AAA measuring 5-5.7 cm - Emergent TEE 4/15: significant for torrential aortic regurgitation and dissection flap extending from the aortic root to the proximal ascending aorta  - CT C/A/P: Showed acute type A dissection with a 5.5 cm ascending aortic aneurysm extending into arch. Dissection goes down to infrarenal aorta and extends up into innominate and left common carotid arterie  - Followed by TCTS, taken to OR 4/15 - Now s/p aortic dissection repair 02/02/24 (Bentall/AVR and reimplantation of coronaries. Intra-op course c/b coagulopathy due to Effient  (PCI of RCA earlier in the day) - Received 6uPRBCs, 6 plt, 4 FFP and 5 cryo intra-op.   - Follow H&H. Keep hgb >8. Hgb 8.2 today - Persistent wide pulse pressure  2. Cardiogenic shock - Post perfusion CGS with wide pulse pressure - Echo 4/15: reviewed with Dr. Bruce Caper: EF 55-60%, RV mild-mod reduced - Co-ox 62%. On Epi 2 + VP 0.04, wean as able - Significantly volume overloaded. +6L in last 2 days. Will likely need CVVHD today or tomorrow for volume removal and assist with renal recovery. Give Lasix  120 mg tid today + 500 mg diuril .  - LA 3.6>3.2, will repeat today - Vent management per PCCM.    3. CAD, STEMI - LHC with single vessel occlusive CAD involving the mid to distal RCA, successful PCI of the RCA with overlapping DES x 3. - Holding ASA   - Hold statin d/t rising LFTs (LDL 93. Goal <70). - CMET tomorrow   4. HTN - Persistent wide pulse pressure - Pressor support as above  5. A fib PVCs - Patient with AF intra-op.  - Continue amio 30/hr. - NSR on tele with AF with RVR 0100 to 0200 - Keep K>4 and Mg >2  - Eventual heparin  gtt  6. Hypokalemia - resolved.   7. AKI - Baseline SCr ~1.3-1.4 - SCr up to 3.28 today.  - Net + 16L post-op and weight up ~30lbs.  - Nephrology has been consulted. Will attempt aggressive diuresis, however likely will  need CVVHD for volume removal and renal recovery. - Avoid hypotension  8. ABLA post-op - Hgb 8.2 today - Received 6uPRBCs, 6 plt, 4 FFP and 5 cryo intra-op.   - Follow H&H. Keep hgb >8.  - DIC panel complete.  CRITICAL CARE Performed by: [place] Lee  Total critical care time: 14 minutes  Critical care time was exclusive of separately billable procedures and treating other patients.  Critical care was necessary to treat or prevent imminent or life-threatening deterioration.  Critical care was time spent personally by me on the following activities: development of treatment plan with patient and/or surrogate as well as nursing, discussions with consultants, evaluation of patient's response to treatment, examination of patient, obtaining history from patient or surrogate, ordering and performing treatments and interventions, ordering and review of laboratory studies, ordering and review of radiographic studies, pulse oximetry and re-evaluation of patient's condition.    Length of Stay: 3  [place] Lee, NP  02/04/2024, 10:12 AM  Advanced Heart Failure Team Pager [phone] (M-F; 7a - 5p)  Please contact [facility] for night-coverage after hours (5p -7a ) and weekends on [email or web address]  Agree with above.  Extubated this am. Lethargic but will follow some commands.   Remanis on epi 2 VP 0.04 Co-ox 62%  Echo 4/17 with severe biventricular dysfucntion EF 20-25%  sCR continues to rise. Modest output with high dose lasix . Weight up 30 pounds  General:  Extubated. Lethargic but will follow some commands HEENT: normal Neck: supple. JVP up Cor: Surgical dressings ok Regular rate & rhythm. No rubs, gallops or murmurs. Lungs: clear Abdomen: soft, nontender, + distended. No hepatosplenomegaly. No bruits or masses. Hypoactive bowel sounds. Extremities: no cyanosis, clubbing, rash, 3+ edema Neuro:lethargic will follow some commands  Remains very tenuous. Ech with severe biv dysfunction  post-op. Continue epi for support. Can add milrinone  or DBA as needed.   Main issue now will be volume removal. Will give lasix  today. If no response low threshold to start CVVHD.   ASA today. Start Plavix  tomorrow.   D/w CCM and TCTS.   CRITICAL CARE Performed by: Jules Oar  Total critical care time: 45 minutes  Critical care time was exclusive of separately billable procedures and treating other patients.  Critical care was necessary to treat or prevent imminent or life-threatening deterioration.  Critical care was time spent personally by me (independent of midlevel providers or residents) on the following activities: development of treatment plan with patient and/or surrogate as well as nursing, discussions with consultants, evaluation of patient's response to treatment, examination of patient, obtaining history from patient or surrogate, ordering and performing treatments and interventions, ordering and review of laboratory studies, ordering and review of radiographic studies, pulse oximetry and re-evaluation of patient's condition.  Jules Oar, MD  10:59 AM

## 2024-02-04 NOTE — Progress Notes (Signed)
 3 Days Post-Op Procedure(s) (LRB): REPAIR OF ASCENDING AORTIC ANEURYSM USING A 32X12X10X10X10MM HEMASHIELD PLATINUM GRAFT. (N/A) ECHOCARDIOGRAM, TRANSESOPHAGEAL, INTRAOPERATIVE (N/A) BENTALL PROCEDURE USING A KONECT RESILIA AORTIC VALVE CONDUIT (N/A) Subjective:  Hemodynamics stable overnight.  Now on Epi 2, vaso 0.04.  CI 2.1, Co-ox 62%  Remains sedated on vent. Still waking up wild.  Some response to lasix  yesterday. +1L. Wt. Stable. About 32 lbs over preop if accurate.  Objective: Vital signs in last 24 hours: Temp:  [97.9 F (36.6 C)-98.8 F (37.1 C)] 97.9 F (36.6 C) (04/18 0615) Pulse Rate:  [71-90] 73 (04/18 0615) Cardiac Rhythm: Normal sinus rhythm (04/18 0400) Resp:  [0-29] 14 (04/18 0615) BP: (119-127)/(52-57) 120/53 (04/18 0313) SpO2:  [84 %-98 %] 97 % (04/18 0615) Arterial Line BP: (95-161)/(48-80) 105/57 (04/18 0615) FiO2 (%):  [60 %] 60 % (04/18 0400) Weight:  [134.8 kg] 134.8 kg (04/18 0600)  Hemodynamic parameters for last 24 hours: PAP: (25-64)/(13-46) 33/17 CVP:  [0 mmHg-40 mmHg] 9 mmHg CO:  [5.1 L/min-6.9 L/min] 5.1 L/min CI:  [2.1 L/min/m2-2.8 L/min/m2] 2.1 L/min/m2  Intake/Output from previous day: 04/17 0701 - 04/18 0700 In: 2911.2 [I.V.:2055; Blood:534; NG/GT:60; IV Piggyback:262.1] Out: 1875 [Urine:1445; Chest Tube:430] Intake/Output this shift: No intake/output data recorded.  General appearance: intubated on vent. Anasarca. Neurologic: unable to assess. Reportedly waking up agitated per nurse. Moving all ext. Heart: regular rate and rhythm, S1, S2 normal, no murmur Lungs: clear to auscultation bilaterally Abdomen: soft, no bowel sounds. Extremities: warm, marked edema, pedal pulses palpable. Wound: dressings dry  Lab Results: Recent Labs    02/03/24 0413 02/03/24 0806 02/03/24 0823 02/04/24 0429 02/04/24 0617  WBC 18.5*  --   --  24.1*  --   HGB 7.6*  6.5*  --    < > 8.2* 7.5*  HCT 22.3*  19.0*  --    < > 24.8* 22.0*  PLT  66* 66*  --  71*  --    < > = values in this interval not displayed.   BMET:  Recent Labs    02/03/24 0413 02/03/24 0823 02/04/24 0429 02/04/24 0617  NA 149*  147*   < > 147* 146*  K 4.5  4.4   < > 4.3 4.2  CL 100  --  104  --   CO2 31  --  32  --   GLUCOSE 132*  --  129*  --   BUN 29*  --  41*  --   CREATININE 2.73*  --  3.28*  --   CALCIUM  7.9*  --  7.6*  --    < > = values in this interval not displayed.    PT/INR:  Recent Labs    02/03/24 0806  LABPROT 18.0*  INR 1.5*   ABG    Component Value Date/Time   PHART 7.432 02/04/2024 0617   HCO3 34.4 (H) 02/04/2024 0617   TCO2 36 (H) 02/04/2024 0617   ACIDBASEDEF 4.0 (H) 02/02/2024 0859   O2SAT 97 02/04/2024 0617   CBG (last 3)  Recent Labs    02/04/24 0209 02/04/24 0427 02/04/24 0615  GLUCAP 119* 131* 117*   CXR: improved aeration.   Assessment/Plan: S/P Procedure(s) (LRB): REPAIR OF ASCENDING AORTIC ANEURYSM USING A 32X12X10X10X10MM HEMASHIELD PLATINUM GRAFT. (N/A) ECHOCARDIOGRAM, TRANSESOPHAGEAL, INTRAOPERATIVE (N/A) BENTALL PROCEDURE USING A KONECT RESILIA AORTIC VALVE CONDUIT (N/A)  POD 3  Hemodynamics stable on low dose epi and vaso 0.04. Echo yesterday showed severe LV systolic dysfunction with EF 25%,  global hypokinesis, moderate RV systolic dysfunction per DB. This is a significant change from intra-op TEE. Continue inotropic support as needed. Hopefully this will recover.  AKI due to combination of left kidney malperfusion seen on preop CTA related to dissection, preop cath and CTA dye, long pump run with circ arrest. He did respond some to lasix  yesterday but creat up to 3.28. Will try lasix  again today.   Hypoxemic respiratory failure improved. CCM following for help with management of this complex patient with multisystem organ dysfunction. Vent wean as he awakens.  Wean sedation and try to wake up slowly.  Keep chest tubes for now.  Thrombocytopenia improved. Start ASA 325 per  tube.  Preop inferior STEMI with extensive DES to RCA. Received Effient  pre cath. Can't be on DAPT at this time.   Will need nutrition at some point. Can have Cortrak and trickle tube feeds.  LOS: 3 days    Bartley Lightning 02/04/2024

## 2024-02-05 ENCOUNTER — Inpatient Hospital Stay (HOSPITAL_COMMUNITY)

## 2024-02-05 DIAGNOSIS — G9340 Encephalopathy, unspecified: Secondary | ICD-10-CM

## 2024-02-05 DIAGNOSIS — I7101 Dissection of ascending aorta: Secondary | ICD-10-CM | POA: Diagnosis not present

## 2024-02-05 DIAGNOSIS — I2111 ST elevation (STEMI) myocardial infarction involving right coronary artery: Secondary | ICD-10-CM | POA: Diagnosis not present

## 2024-02-05 DIAGNOSIS — R570 Cardiogenic shock: Secondary | ICD-10-CM

## 2024-02-05 DIAGNOSIS — I4891 Unspecified atrial fibrillation: Secondary | ICD-10-CM | POA: Diagnosis not present

## 2024-02-05 DIAGNOSIS — R739 Hyperglycemia, unspecified: Secondary | ICD-10-CM | POA: Diagnosis not present

## 2024-02-05 DIAGNOSIS — N179 Acute kidney failure, unspecified: Secondary | ICD-10-CM | POA: Diagnosis not present

## 2024-02-05 LAB — BPAM RBC
Blood Product Expiration Date: 202505072359
Blood Product Expiration Date: 202505082359
Blood Product Expiration Date: 202505082359
Blood Product Expiration Date: 202505092359
Blood Product Expiration Date: 202505132359
Blood Product Expiration Date: 202505132359
Blood Product Expiration Date: 202505142359
Blood Product Expiration Date: 202505142359
Blood Product Expiration Date: 202505142359
Blood Product Expiration Date: 202505142359
Blood Product Expiration Date: 202505142359
Blood Product Expiration Date: 202505142359
Blood Product Expiration Date: 202505142359
ISSUE DATE / TIME: 202504151943
ISSUE DATE / TIME: 202504151943
ISSUE DATE / TIME: 202504151943
ISSUE DATE / TIME: 202504151943
ISSUE DATE / TIME: 202504160132
ISSUE DATE / TIME: 202504160132
ISSUE DATE / TIME: 202504160225
ISSUE DATE / TIME: 202504160344
ISSUE DATE / TIME: 202504160344
ISSUE DATE / TIME: 202504161432
ISSUE DATE / TIME: 202504170533
ISSUE DATE / TIME: 202504181236
ISSUE DATE / TIME: 202504182020
Unit Type and Rh: 5100
Unit Type and Rh: 5100
Unit Type and Rh: 5100
Unit Type and Rh: 5100
Unit Type and Rh: 5100
Unit Type and Rh: 5100
Unit Type and Rh: 5100
Unit Type and Rh: 9500
Unit Type and Rh: 9500
Unit Type and Rh: 9500
Unit Type and Rh: 9500
Unit Type and Rh: 9500
Unit Type and Rh: 9500

## 2024-02-05 LAB — CBC
HCT: 26 % — ABNORMAL LOW (ref 39.0–52.0)
HCT: 25 % — ABNORMAL LOW (ref 39.0–52.0)
Hemoglobin: 8.7 g/dL — ABNORMAL LOW (ref 13.0–17.0)
Hemoglobin: 8.3 g/dL — ABNORMAL LOW (ref 13.0–17.0)
MCH: 29 pg (ref 26.0–34.0)
MCH: 28.6 pg (ref 26.0–34.0)
MCHC: 33.5 g/dL (ref 30.0–36.0)
MCHC: 33.2 g/dL (ref 30.0–36.0)
MCV: 86.7 fL (ref 80.0–100.0)
MCV: 86.2 fL (ref 80.0–100.0)
Platelets: 82 10*3/uL — ABNORMAL LOW (ref 150–400)
Platelets: 86 10*3/uL — ABNORMAL LOW (ref 150–400)
RBC: 3 MIL/uL — ABNORMAL LOW (ref 4.22–5.81)
RBC: 2.9 MIL/uL — ABNORMAL LOW (ref 4.22–5.81)
RDW: 17.1 % — ABNORMAL HIGH (ref 11.5–15.5)
RDW: 17.1 % — ABNORMAL HIGH (ref 11.5–15.5)
WBC: 18.4 10*3/uL — ABNORMAL HIGH (ref 4.0–10.5)
WBC: 18.7 10*3/uL — ABNORMAL HIGH (ref 4.0–10.5)
nRBC: 2.2 % — ABNORMAL HIGH (ref 0.0–0.2)
nRBC: 2.7 % — ABNORMAL HIGH (ref 0.0–0.2)

## 2024-02-05 LAB — ECHOCARDIOGRAM LIMITED
AV Mean grad: 7.3 mmHg
AV Peak grad: 13.1 mmHg
Ao pk vel: 1.81 m/s
Est EF: 35
Height: 73 in
Weight: 4585.57 [oz_av]

## 2024-02-05 LAB — COMPREHENSIVE METABOLIC PANEL WITH GFR
ALT: 70 U/L — ABNORMAL HIGH (ref 0–44)
AST: 689 U/L — ABNORMAL HIGH (ref 15–41)
Albumin: 3.3 g/dL — ABNORMAL LOW (ref 3.5–5.0)
Alkaline Phosphatase: 45 U/L (ref 38–126)
Anion gap: 15 (ref 5–15)
BUN: 50 mg/dL — ABNORMAL HIGH (ref 8–23)
CO2: 30 mmol/L (ref 22–32)
Calcium: 8.5 mg/dL — ABNORMAL LOW (ref 8.9–10.3)
Chloride: 100 mmol/L (ref 98–111)
Creatinine, Ser: 3.22 mg/dL — ABNORMAL HIGH (ref 0.61–1.24)
GFR, Estimated: 20 mL/min — ABNORMAL LOW (ref >60)
Glucose, Bld: 130 mg/dL — ABNORMAL HIGH (ref 70–99)
Potassium: 3.5 mmol/L (ref 3.5–5.1)
Sodium: 145 mmol/L (ref 135–145)
Total Bilirubin: 1 mg/dL (ref 0.0–1.2)
Total Protein: 5.3 g/dL — ABNORMAL LOW (ref 6.5–8.1)

## 2024-02-05 LAB — TYPE AND SCREEN
ABO/RH(D): B NEG
ABO/RH(D): B NEG
Antibody Screen: NEGATIVE
Antibody Screen: NEGATIVE
Unit division: 0
Unit division: 0
Unit division: 0
Unit division: 0
Unit division: 0
Unit division: 0
Unit division: 0
Unit division: 0
Unit division: 0
Unit division: 0
Unit division: 0
Unit division: 0
Unit division: 0

## 2024-02-05 LAB — BASIC METABOLIC PANEL WITH GFR
Anion gap: 14 (ref 5–15)
BUN: 49 mg/dL — ABNORMAL HIGH (ref 8–23)
CO2: 30 mmol/L (ref 22–32)
Calcium: 8.1 mg/dL — ABNORMAL LOW (ref 8.9–10.3)
Chloride: 99 mmol/L (ref 98–111)
Creatinine, Ser: 3.17 mg/dL — ABNORMAL HIGH (ref 0.61–1.24)
GFR, Estimated: 21 mL/min — ABNORMAL LOW (ref >60)
Glucose, Bld: 141 mg/dL — ABNORMAL HIGH (ref 70–99)
Potassium: 3.9 mmol/L (ref 3.5–5.1)
Sodium: 143 mmol/L (ref 135–145)

## 2024-02-05 LAB — COOXEMETRY PANEL
Carboxyhemoglobin: 1 % (ref 0.5–1.5)
Carboxyhemoglobin: 0.7 % (ref 0.5–1.5)
Methemoglobin: <0.7 % (ref 0.0–1.5)
Methemoglobin: <0.7 % (ref 0.0–1.5)
O2 Saturation: 48.5 %
O2 Saturation: 48.7 %
Total hemoglobin: 8 g/dL — ABNORMAL LOW (ref 12.0–16.0)
Total hemoglobin: 8.4 g/dL — ABNORMAL LOW (ref 12.0–16.0)

## 2024-02-05 LAB — POCT I-STAT 7, (LYTES, BLD GAS, ICA,H+H)
Acid-Base Excess: 11 mmol/L — ABNORMAL HIGH (ref 0.0–2.0)
Acid-Base Excess: 9 mmol/L — ABNORMAL HIGH (ref 0.0–2.0)
Acid-Base Excess: 9 mmol/L — ABNORMAL HIGH (ref 0.0–2.0)
Bicarbonate: 32 mmol/L — ABNORMAL HIGH (ref 20.0–28.0)
Bicarbonate: 32.3 mmol/L — ABNORMAL HIGH (ref 20.0–28.0)
Bicarbonate: 34 mmol/L — ABNORMAL HIGH (ref 20.0–28.0)
Calcium, Ion: 1.05 mmol/L — ABNORMAL LOW (ref 1.15–1.40)
Calcium, Ion: 1.06 mmol/L — ABNORMAL LOW (ref 1.15–1.40)
Calcium, Ion: 1.07 mmol/L — ABNORMAL LOW (ref 1.15–1.40)
HCT: 23 % — ABNORMAL LOW (ref 39.0–52.0)
HCT: 24 % — ABNORMAL LOW (ref 39.0–52.0)
HCT: 24 % — ABNORMAL LOW (ref 39.0–52.0)
Hemoglobin: 7.8 g/dL — ABNORMAL LOW (ref 13.0–17.0)
Hemoglobin: 8.2 g/dL — ABNORMAL LOW (ref 13.0–17.0)
Hemoglobin: 8.2 g/dL — ABNORMAL LOW (ref 13.0–17.0)
O2 Saturation: 91 %
O2 Saturation: 94 %
O2 Saturation: 99 %
Patient temperature: 37.3
Patient temperature: 37.9
Patient temperature: 38.3
Potassium: 3.5 mmol/L (ref 3.5–5.1)
Potassium: 3.6 mmol/L (ref 3.5–5.1)
Potassium: 3.9 mmol/L (ref 3.5–5.1)
Sodium: 143 mmol/L (ref 135–145)
Sodium: 144 mmol/L (ref 135–145)
Sodium: 145 mmol/L (ref 135–145)
TCO2: 33 mmol/L — ABNORMAL HIGH (ref 22–32)
TCO2: 33 mmol/L — ABNORMAL HIGH (ref 22–32)
TCO2: 35 mmol/L — ABNORMAL HIGH (ref 22–32)
pCO2 arterial: 36.7 mmHg (ref 32–48)
pCO2 arterial: 39.6 mmHg (ref 32–48)
pCO2 arterial: 40.2 mmHg (ref 32–48)
pH, Arterial: 7.517 — ABNORMAL HIGH (ref 7.35–7.45)
pH, Arterial: 7.544 — ABNORMAL HIGH (ref 7.35–7.45)
pH, Arterial: 7.55 — ABNORMAL HIGH (ref 7.35–7.45)
pO2, Arterial: 125 mmHg — ABNORMAL HIGH (ref 83–108)
pO2, Arterial: 56 mmHg — ABNORMAL LOW (ref 83–108)
pO2, Arterial: 69 mmHg — ABNORMAL LOW (ref 83–108)

## 2024-02-05 LAB — MAGNESIUM: Magnesium: 2.2 mg/dL (ref 1.7–2.4)

## 2024-02-05 LAB — GLUCOSE, CAPILLARY
Glucose-Capillary: 117 mg/dL — ABNORMAL HIGH (ref 70–99)
Glucose-Capillary: 122 mg/dL — ABNORMAL HIGH (ref 70–99)
Glucose-Capillary: 131 mg/dL — ABNORMAL HIGH (ref 70–99)
Glucose-Capillary: 138 mg/dL — ABNORMAL HIGH (ref 70–99)
Glucose-Capillary: 144 mg/dL — ABNORMAL HIGH (ref 70–99)
Glucose-Capillary: 90 mg/dL (ref 70–99)

## 2024-02-05 LAB — PHOSPHORUS: Phosphorus: 3.7 mg/dL (ref 2.5–4.6)

## 2024-02-05 LAB — MRSA NEXT GEN BY PCR, NASAL: MRSA by PCR Next Gen: NOT DETECTED

## 2024-02-05 MED ORDER — VANCOMYCIN VARIABLE DOSE PER UNSTABLE RENAL FUNCTION (PHARMACIST DOSING): Status: DC

## 2024-02-05 MED ORDER — FENTANYL CITRATE PF 50 MCG/ML IJ SOSY
PREFILLED_SYRINGE | INTRAMUSCULAR | Status: AC
  Administered 2024-02-05: 100 ug via INTRAVENOUS
  Filled 2024-02-05: qty 2

## 2024-02-05 MED ORDER — METHOCARBAMOL 1000 MG/10ML IJ SOLN
500.0000 mg | Freq: Three times a day (TID) | INTRAMUSCULAR | Status: DC
  Filled 2024-02-05: qty 10

## 2024-02-05 MED ORDER — INSULIN GLARGINE-YFGN 100 UNIT/ML ~~LOC~~ SOLN
25.0000 [IU] | Freq: Every day | SUBCUTANEOUS | Status: DC
  Filled 2024-02-05: qty 0.25

## 2024-02-05 MED ORDER — ROCURONIUM BROMIDE 10 MG/ML (PF) SYRINGE
PREFILLED_SYRINGE | INTRAVENOUS | Status: AC
  Filled 2024-02-05: qty 10

## 2024-02-05 MED ORDER — FUROSEMIDE 10 MG/ML IJ SOLN
120.0000 mg | Freq: Four times a day (QID) | INTRAVENOUS | Status: AC
  Administered 2024-02-05 (×3): 120 mg via INTRAVENOUS
  Filled 2024-02-05 (×2): qty 10
  Filled 2024-02-05: qty 12

## 2024-02-05 MED ORDER — FUROSEMIDE 10 MG/ML IJ SOLN
120.0000 mg | Freq: Once | INTRAVENOUS | Status: DC
  Filled 2024-02-05: qty 10

## 2024-02-05 MED ORDER — METHOCARBAMOL 1000 MG/10ML IJ SOLN
500.0000 mg | Freq: Three times a day (TID) | INTRAMUSCULAR | Status: DC
  Administered 2024-02-05 – 2024-02-15 (×28): 500 mg via INTRAVENOUS
  Filled 2024-02-05 (×28): qty 10

## 2024-02-05 MED ORDER — MIDAZOLAM HCL 2 MG/2ML IJ SOLN
INTRAMUSCULAR | Status: AC
  Administered 2024-02-05: 2 mg via INTRAVENOUS
  Filled 2024-02-05: qty 2

## 2024-02-05 MED ORDER — FENTANYL 2500MCG IN NS 250ML (10MCG/ML) PREMIX INFUSION
0.0000 ug/h | INTRAVENOUS | Status: DC
  Administered 2024-02-05: 25 ug/h via INTRAVENOUS
  Administered 2024-02-06: 100 ug/h via INTRAVENOUS
  Administered 2024-02-06 – 2024-02-07 (×3): 200 ug/h via INTRAVENOUS
  Administered 2024-02-08: 150 ug/h via INTRAVENOUS
  Administered 2024-02-08 (×2): 200 ug/h via INTRAVENOUS
  Administered 2024-02-10: 25 ug/h via INTRAVENOUS
  Filled 2024-02-05 (×8): qty 250

## 2024-02-05 MED ORDER — DEXTROSE 5 % IV SOLN
500.0000 mg | Freq: Once | INTRAVENOUS | Status: AC
  Administered 2024-02-05: 500 mg via INTRAVENOUS
  Filled 2024-02-05: qty 17.8

## 2024-02-05 MED ORDER — ACETAMINOPHEN 10 MG/ML IV SOLN
1000.0000 mg | Freq: Four times a day (QID) | INTRAVENOUS | Status: AC
  Administered 2024-02-05 – 2024-02-06 (×4): 1000 mg via INTRAVENOUS
  Filled 2024-02-05 (×4): qty 100

## 2024-02-05 MED ORDER — MIDAZOLAM HCL 2 MG/2ML IJ SOLN
1.0000 mg | INTRAMUSCULAR | Status: DC | PRN
  Administered 2024-02-05 – 2024-02-10 (×9): 2 mg via INTRAVENOUS
  Filled 2024-02-05 (×12): qty 2

## 2024-02-05 MED ORDER — MIDAZOLAM HCL 2 MG/2ML IJ SOLN: 2.0000 mg | Freq: Once | INTRAMUSCULAR | Status: AC

## 2024-02-05 MED ORDER — KETAMINE HCL 50 MG/5ML IJ SOSY
PREFILLED_SYRINGE | INTRAMUSCULAR | Status: AC
  Filled 2024-02-05: qty 10

## 2024-02-05 MED ORDER — PERFLUTREN LIPID MICROSPHERE
1.0000 mL | INTRAVENOUS | Status: AC | PRN
Start: 2024-02-05 — End: 2024-02-05
  Administered 2024-02-05: 4 mL via INTRAVENOUS

## 2024-02-05 MED ORDER — ETOMIDATE 2 MG/ML IV SOLN: 20.0000 mg | Freq: Once | INTRAVENOUS | Status: AC

## 2024-02-05 MED ORDER — FENTANYL BOLUS VIA INFUSION
25.0000 ug | INTRAVENOUS | Status: DC | PRN
  Administered 2024-02-05 – 2024-02-06 (×8): 50 ug via INTRAVENOUS
  Administered 2024-02-06 – 2024-02-07 (×2): 100 ug via INTRAVENOUS
  Administered 2024-02-07: 50 ug via INTRAVENOUS
  Administered 2024-02-07 – 2024-02-08 (×14): 100 ug via INTRAVENOUS
  Administered 2024-02-09: 50 ug via INTRAVENOUS
  Administered 2024-02-09: 100 ug via INTRAVENOUS
  Administered 2024-02-09: 50 ug via INTRAVENOUS
  Administered 2024-02-09 (×2): 25 ug via INTRAVENOUS
  Administered 2024-02-09: 50 ug via INTRAVENOUS
  Administered 2024-02-09: 100 ug via INTRAVENOUS
  Administered 2024-02-09: 50 ug via INTRAVENOUS
  Administered 2024-02-09: 100 ug via INTRAVENOUS
  Administered 2024-02-10: 50 ug via INTRAVENOUS
  Administered 2024-02-11: 25 ug via INTRAVENOUS

## 2024-02-05 MED ORDER — VANCOMYCIN HCL 2000 MG/400ML IV SOLN
2000.0000 mg | Freq: Once | INTRAVENOUS | Status: AC
  Administered 2024-02-05: 2000 mg via INTRAVENOUS
  Filled 2024-02-05: qty 400

## 2024-02-05 MED ORDER — ASPIRIN 300 MG RE SUPP
300.0000 mg | Freq: Every day | RECTAL | Status: DC
  Administered 2024-02-05 – 2024-02-06 (×2): 300 mg via RECTAL
  Filled 2024-02-05 (×2): qty 1

## 2024-02-05 MED ORDER — SODIUM CHLORIDE 0.9 % IV SOLN
1.0000 g | Freq: Two times a day (BID) | INTRAVENOUS | Status: DC
  Administered 2024-02-05 – 2024-02-07 (×4): 1 g via INTRAVENOUS
  Filled 2024-02-05 (×4): qty 20

## 2024-02-05 MED ORDER — FENTANYL CITRATE PF 50 MCG/ML IJ SOSY: 100.0000 ug | PREFILLED_SYRINGE | Freq: Once | INTRAMUSCULAR | Status: AC

## 2024-02-05 MED ORDER — FENTANYL CITRATE PF 50 MCG/ML IJ SOSY
50.0000 ug | PREFILLED_SYRINGE | INTRAMUSCULAR | Status: DC | PRN
  Administered 2024-02-05 (×2): 50 ug via INTRAVENOUS
  Administered 2024-02-05: 25 ug via INTRAVENOUS
  Administered 2024-02-05: 50 ug via INTRAVENOUS
  Filled 2024-02-05 (×3): qty 1

## 2024-02-05 MED ORDER — METOCLOPRAMIDE HCL 5 MG/ML IJ SOLN
5.0000 mg | Freq: Three times a day (TID) | INTRAMUSCULAR | Status: DC
  Administered 2024-02-05 – 2024-02-15 (×30): 5 mg via INTRAVENOUS
  Filled 2024-02-05 (×30): qty 2

## 2024-02-05 MED ORDER — POTASSIUM CHLORIDE 10 MEQ/100ML IV SOLN
10.0000 meq | INTRAVENOUS | Status: AC
  Administered 2024-02-05 (×4): 10 meq via INTRAVENOUS
  Filled 2024-02-05 (×4): qty 100

## 2024-02-05 MED ORDER — ALBUTEROL SULFATE (2.5 MG/3ML) 0.083% IN NEBU
INHALATION_SOLUTION | RESPIRATORY_TRACT | Status: AC
  Administered 2024-02-05: 2.5 mg
  Filled 2024-02-05: qty 3

## 2024-02-05 MED ORDER — AMIODARONE LOAD VIA INFUSION
150.0000 mg | Freq: Once | INTRAVENOUS | Status: AC
  Administered 2024-02-05: 150 mg via INTRAVENOUS
  Filled 2024-02-05: qty 83.34

## 2024-02-05 MED ORDER — SUCCINYLCHOLINE CHLORIDE 200 MG/10ML IV SOSY
PREFILLED_SYRINGE | INTRAVENOUS | Status: AC
  Filled 2024-02-05: qty 10

## 2024-02-05 MED ORDER — FENTANYL CITRATE PF 50 MCG/ML IJ SOSY: 25.0000 ug | PREFILLED_SYRINGE | Freq: Once | INTRAMUSCULAR | Status: DC

## 2024-02-05 MED ORDER — METOPROLOL TARTRATE 5 MG/5ML IV SOLN: 2.5000 mg | INTRAVENOUS | Status: DC | PRN

## 2024-02-05 MED ORDER — ETOMIDATE 2 MG/ML IV SOLN
INTRAVENOUS | Status: AC
  Administered 2024-02-05: 20 mg via INTRAVENOUS
  Filled 2024-02-05: qty 20

## 2024-02-05 MED ORDER — AMIODARONE LOAD VIA INFUSION
150.0000 mg | Freq: Once | INTRAVENOUS | Status: AC
  Administered 2024-02-05: 150 mg via INTRAVENOUS

## 2024-02-05 NOTE — Progress Notes (Signed)
 PHARMACY ANTIBIOTIC CONSULT NOTE   Dustin Davenport a 68 y.o. male admitted on 4/15 s/p type A dissection and Bentall repair and PCI/DES to RCA.  Pharmacy has been consulted for Vancomycin  and Merrem  dosing.   AKI - Scr 3.22, WBC 18.7; LA 4.1 on 4/18 Vital Signs: Tm 100.4 in past 24h, HR 70s  Estimated Creatinine Clearance: 31 mL/min (A) (by C-G formula based on SCr of 3.22 mg/dL (H)).  Plan: START Meropenem  1g IV Q12H START Vancomycin  2000 mg LD x1, then variable dosing based on levels  Monitor renal function, clinical status, de-escalation, C/S, levels as indicated   Allergies:  Allergies  Allergen Reactions   Bee Venom     Filed Weights   02/03/24 0500 02/04/24 0600 02/05/24 0621  Weight: 134.9 kg (297 lb 6.4 oz) 134.8 kg (297 lb 2.9 oz) 130 kg (286 lb 9.6 oz)       Latest Ref Rng & Units 02/05/2024    5:11 AM 02/05/2024    5:02 AM 02/05/2024   12:31 AM  CBC  WBC 4.0 - 10.5 K/uL 18.7     Hemoglobin 13.0 - 17.0 g/dL 8.3  8.2  8.2   Hematocrit 39.0 - 52.0 % 25.0  24.0  24.0   Platelets 150 - 400 K/uL 86       Antibiotics Given (last 72 hours)     Date/Time Action Medication Dose Rate   02/02/24 2115 New Bag/Given   ceFAZolin  (ANCEF ) IVPB 2g/100 mL premix 2 g 200 mL/hr   02/03/24 0512 New Bag/Given   ceFAZolin  (ANCEF ) IVPB 2g/100 mL premix 2 g 200 mL/hr   02/03/24 1351 New Bag/Given   ceFAZolin  (ANCEF ) IVPB 2g/100 mL premix 2 g 200 mL/hr   02/03/24 2208 New Bag/Given   ceFAZolin  (ANCEF ) IVPB 2g/100 mL premix 2 g 200 mL/hr       Antimicrobials this admission: Ancef  periop 4/15 - 4/17 Vanc 4/19 > c Merrem  4/19 > c  Microbiology results: 4/19 BAL: sent  4/15 MRSA PCR: negative, MSSA positive  Thank you for allowing pharmacy to be a part of this patient's care.  Cecillia Cogan, PharmD Clinical Pharmacist 02/05/2024  3:36 PM

## 2024-02-05 NOTE — CV Procedure (Signed)
 Radial arterial line placement.    Emergent consent assumed. The right wrist was prepped and draped in the routine sterile fashion a single lumen radial arterial catheter was placed in the right radial artery using a modified Seldinger technique and u/s guidance. Good blood flow and wave forms. A dressing was placed.     Jules Oar, MD  1:29 PM

## 2024-02-05 NOTE — Progress Notes (Signed)
 Advanced Heart Failure Rounding Note  Cardiologist: None  Chief Complaint: STEMI> Cardiogenic shock> Now s/p aortic dissection repair + Bentall  Significant events:    - Echo 4/15: AAA measuring 5-5.7 cm. Followed by emergent TEE 4/15 which was significant for torrential aortic regurgitation and dissection flap extending from the aortic root to the proximal ascending aorta  - Emergent TEE 4/15: significant for torrential aortic regurgitation and dissection flap extending from the aortic root to the proximal ascending aorta  - CT C/A/P: Showed acute type A dissection with a 5.5 cm ascending aortic aneurysm extending into arch. Dissection goes down to infrarenal aorta and extends up into innominate and left common carotid arterie  - Taken to OR 4/15: Now s/p aortic dissection repair 02/02/24 (Bentall/AVR and reimplantation of coronaries. Intra-op course c/b coagulopathy due to Effient  (PCI of RCA earlier in the day)).  - Extubated 4/18 - Echo EF 20-25% RV severely down. AVR ok  Subjective:    On epi 2. VP .02 Co-ox 48% on early am labs  Remains lethargic. When sedation lightened is very agitated. Moves all 4. Nursing team reports aphasia  RA 12 PAP: (22-43)/(6-27) 31/13 CVP:  [0 mmHg-19 mmHg] 6 mmHg CO:  [5.2 L/min-7.7 L/min] 6.4 L/min CI:  [2.18 L/min/m2-3.17 L/min/m2] 2.65 L/min/m2  Good response to IV lasix  yesterday. 8L out Scr stable at 3.2  In/out AF with post-termination pauses   Objective:    Weight Range: 130 kg Body mass index is 37.81 kg/m.   Vital Signs:   Temp:  [99.7 F (37.6 C)-101.1 F (38.4 C)] 99.7 F (37.6 C) (04/19 1315) Pulse Rate:  [49-112] 71 (04/19 1315) Resp:  [9-31] 24 (04/19 1315) BP: (86-135)/(49-99) 127/61 (04/19 1300) SpO2:  [83 %-100 %] 96 % (04/19 1315) Arterial Line BP: (31-156)/(13-104) 143/57 (04/19 1315) FiO2 (%):  [40 %-70 %] 50 % (04/19 1256) Weight:  [130 kg] 130 kg (04/19 0621) Last BM Date :  (PTA)  Weight change: Filed  Weights   02/03/24 0500 02/04/24 0600 02/05/24 0621  Weight: 134.9 kg 134.8 kg 130 kg   Intake/Output:  Intake/Output Summary (Last 24 hours) at 02/05/2024 1332 Last data filed at 02/05/2024 1300 Gross per 24 hour  Intake 4168.81 ml  Output 7240 ml  Net -3071.19 ml    Physical Exam   General:  Lethargic ill-appearing.  HEENT: normal Neck: supple. + swan Cor: Sternal dressing ok Irregular Lungs: coarse Abdomen: obese distended.Hypoactive bowel sounds. Extremities: no cyanosis, clubbing, rash, 4+ diffuse edema Neuro:agitated/lethargic moves all 4. RN reports some aphasi   Telemetry   SR 70-80s, in AF RVR from 0100-0200 (personally reviewed)  EKG   No new EKG to review  Labs    CBC Recent Labs    02/04/24 2359 02/05/24 0031 02/05/24 0502 02/05/24 0511  WBC 18.4*  --   --  18.7*  HGB 8.7*   < > 8.2* 8.3*  HCT 26.0*   < > 24.0* 25.0*  MCV 86.7  --   --  86.2  PLT 82*  --   --  86*   < > = values in this interval not displayed.   Basic Metabolic Panel Recent Labs    40/98/11 0428 02/04/24 0429 02/05/24 0032 02/05/24 0502 02/05/24 0511  NA  --    < > 143 145 145  K  --    < > 3.9 3.6 3.5  CL  --    < > 99  --  100  CO2  --    < >  30  --  30  GLUCOSE  --    < > 141*  --  130*  BUN  --    < > 49*  --  50*  CREATININE  --    < > 3.17*  --  3.22*  CALCIUM   --    < > 8.1*  --  8.5*  MG 2.4  --  2.2  --   --   PHOS  --   --   --   --  3.7   < > = values in this interval not displayed.   Liver Function Tests Recent Labs    02/04/24 0429 02/05/24 0511  AST 283* 689*  ALT 49* 70*  ALKPHOS 38 45  BILITOT 0.5 1.0  PROT 4.5* 5.3*  ALBUMIN  2.6* 3.3*   D-Dimer Recent Labs    02/03/24 0806  DDIMER 4.99*   Hemoglobin A1C No results for input(s): "HGBA1C" in the last 72 hours.  Imaging   ECHOCARDIOGRAM LIMITED Result Date: 02/05/2024    ECHOCARDIOGRAM LIMITED REPORT   Patient Name:   Dustin Davenport Date of Exam: 02/05/2024 Medical Rec #:  [id number]     Height:       73.0 in Accession #:    [id number]   Weight:       286.6 lb Date of Birth:  13-May-1956    BSA:          2.507 m Patient Age:    68 years     BP:           107/62 mmHg Patient Gender: M            HR:           90 bpm. Exam Location:  Inpatient Procedure: Limited Echo, Cardiac Doppler and Color Doppler (Both Spectral and            Color Flow Doppler were utilized during procedure). STAT ECHO Indications:    shock  History:        Patient has prior history of Echocardiogram examinations, most                 recent 02/03/2024. CAD, Bentall. aortic dissection repair; Risk                 Factors:Hypertension.  Sonographer:    Dione Franks RDCS Referring Phys: [id number] Tino Foreman SMITH IMPRESSIONS  1. Limited Echo to evaluate LV function.  2. Challenging study. No LV thrombus by Definity . Left ventricular ejection fraction, by estimation, is 45 to 55%. The left ventricle has low normal function. No major regional wall motion abnormalities. Left ventricular diastolic function could not be evaluated.  3. Right ventricular systolic function is normal. The right ventricular size is normal. FINDINGS  Left Ventricle: No LV thrombus by Definity . Left ventricular ejection fraction, by estimation, is 50 to 55%. The left ventricle has low normal function. The left ventricle has no regional wall motion abnormalities. The left ventricular internal cavity size was normal in size. There is no left ventricular hypertrophy. Left ventricular diastolic function could not be evaluated. Right Ventricle: The right ventricular size is normal. No increase in right ventricular wall thickness. Right ventricular systolic function is normal. Left Atrium: Left atrial size was not assessed. Right Atrium: Right atrial size was normal in size. Pericardium: There is no evidence of pericardial effusion. Mitral Valve: The mitral valve was not well visualized. Trivial mitral valve regurgitation. Tricuspid Valve: The tricuspid valve is not  well visualized. Tricuspid valve regurgitation is trivial. Aortic Valve: The aortic valve was not well visualized. Aortic valve regurgitation is not visualized. No aortic stenosis is present. Aortic valve mean gradient measures 7.2 mmHg. Aortic valve peak gradient measures 13.1 mmHg. Pulmonic Valve: The pulmonic valve was not well visualized. Aorta: Aortic root could not be assessed. Venous: The inferior vena cava was not well visualized. IAS/Shunts: The interatrial septum was not well visualized. Additional Comments: Spectral Doppler performed. Color Doppler performed.  AORTIC VALVE AV Vmax:           181.00 cm/s AV Vmean:          120.750 cm/s AV VTI:            0.275 m AV Peak Grad:      13.1 mmHg AV Mean Grad:      7.2 mmHg LVOT Vmax:         108.65 cm/s LVOT Vmean:        69.550 cm/s LVOT VTI:          0.159 m LVOT/AV VTI ratio: 0.58 TRICUSPID VALVE TR Peak grad:   19.4 mmHg TR Vmax:        220.00 cm/s  SHUNTS Systemic VTI: 0.16 m Vishnu Priya Mallipeddi Electronically signed by Lucetta Russel Mallipeddi Signature Date/Time: 02/05/2024/11:29:50 AM    Final    DG CHEST PORT 1 VIEW Result Date: 02/05/2024 CLINICAL DATA:  Status post aortic valve replacement EXAM: PORTABLE CHEST 1 VIEW COMPARISON:  02/04/2024 FINDINGS: There is been interval removal of the endotracheal tube and enteric tube. Stable mediastinal drain. No pneumothorax identified. The right IJ pulmonary arterial catheter is stable with tip in the right main pulmonary artery. Stable cardiomediastinal contours. Persistent bilateral pleural effusions with progressive veil like opacification over bilateral lower lung zones. IMPRESSION: 1. Interval removal of endotracheal tube and enteric tube. 2. Persistent bilateral pleural effusions with progressive veil like opacification over bilateral lower lung zones. Electronically Signed   By: Kimberley Penman M.D.   On: 02/05/2024 07:23   Medications:    Scheduled Medications:  sodium chloride    Intravenous  Once   aspirin   300 mg Rectal Daily   Chlorhexidine  Gluconate Cloth  6 each Topical Daily   insulin  aspart  0-20 Units Subcutaneous Q4H   [START ON 02/06/2024] insulin  glargine-yfgn  25 Units Subcutaneous Daily   methocarbamol  (ROBAXIN ) injection  500 mg Intravenous Q8H   metoCLOPramide  (REGLAN ) injection  5 mg Intravenous Q8H   mupirocin  ointment  1 Application Nasal BID   mouth rinse  15 mL Mouth Rinse Q2H   pantoprazole  (PROTONIX ) IV  40 mg Intravenous QHS   sodium chloride  flush  10-40 mL Intracatheter Q12H   sodium chloride  flush  3 mL Intravenous Q12H   sodium chloride  flush  3-10 mL Intravenous Q12H   Infusions:  acetaminophen  Stopped (02/05/24 0943)   amiodarone  60 mg/hr (02/05/24 1300)   dexmedetomidine  (PRECEDEX ) IV infusion 1 mcg/kg/hr (02/05/24 1300)   epinephrine  2 mcg/min (02/05/24 1300)   furosemide  62 mL/hr at 02/05/24 1300   norepinephrine  (LEVOPHED ) Adult infusion Stopped (02/05/24 0333)   potassium chloride  10 mEq (02/05/24 1306)   vasopressin  0.02 Units/min (02/05/24 1300)   PRN Medications: fentaNYL  (SUBLIMAZE ) injection, metoprolol  tartrate, ondansetron  (ZOFRAN ) IV, mouth rinse, perflutren  lipid microspheres (DEFINITY ) IV suspension, sodium chloride  flush, sodium chloride  flush, sodium chloride  flush  Patient Profile   Dustin Davenport is a 68 y.o. male with HTN and HLD. Admitted with STEMI now with CGS. Found to  have AAA and taken emergently to the OR. Now s/p Bentall/AVR and reimplantation of coronaries.  Assessment/Plan   Acute type A aortic dissection  - Echo 4/15: AAA measuring 5-5.7 cm - Emergent TEE 4/15: significant for torrential aortic regurgitation and dissection flap extending from the aortic root to the proximal ascending aorta  - CT C/A/P: Showed acute type A dissection with a 5.5 cm ascending aortic aneurysm extending into arch. Dissection goes down to infrarenal aorta and extends up into innominate and left common carotid arterie  - Now s/p aortic  dissection repair 02/01/24 (Bentall/AVR and reimplantation of coronaries. Intra-op course c/b coagulopathy due to Effient  (PCI of RCA earlier in the day)  2. CAD, Acute inferiro STEMI - Admitted with STEMI. Post PCI found to also have aortic dissection - LHC with single vessel occlusive CAD involving the mid to distal RCA, successful PCI of the RCA with overlapping DES x 3. - Now back on ASA. Off Effient  post-op. Start plavix  when able (had some blood from CTs on 4/18 so holding off)  - Hold statin d/t rising LFTs (LDL 93. Goal <70). Restart soon  3. Cardiogenic shock - Post perfusion CGS with wide pulse pressure - Echo 4/15: reviewed with Dr. Bruce Caper: EF 55-60%, RV mild-mod reduced - Co-ox 48%. On Epi 2 + VP 0.02.  Thermodilution numbers better - Massively volume overloaded. Now responding to IV lasix . Will repeat  - Echo today 02/05/24 EF ~20-25% RV sev HK  AVR ok  4. Post-op hypoxic resp failure - extubated 4/18 - remains tenuous - at high irsk for reintuabtion - CCM following   5. PAF with post-termination pauses PVCs - Patient with AF intra-op.  - In/out AF. Continue amio. Watch pause  6. Hypokalemia - resolved.   7. AKI due to ATN/shock - Baseline SCr ~1.3-1.4 - SCr stable at 3.2 - Now diuresing well. Continue IV lasix  and diuril  - He is massively volume overloaded  8. ABLA post-op - Hgb 8.3 today - Received 6uPRBCs, 6 plt, 4 FFP and 5 cryo intra-op.   - Follow H&H. Keep hgb >8.    CRITICAL CARE Performed by: Jules Oar  Total critical care time: 55 minutes  Critical care time was exclusive of separately billable procedures and treating other patients.  Critical care was necessary to treat or prevent imminent or life-threatening deterioration.  Critical care was time spent personally by me on the following activities: development of treatment plan with patient and/or surrogate as well as nursing, discussions with consultants, evaluation of patient's  response to treatment, examination of patient, obtaining history from patient or surrogate, ordering and performing treatments and interventions, ordering and review of laboratory studies, ordering and review of radiographic studies, pulse oximetry and re-evaluation of patient's condition.    Length of Stay: 4  Jules Oar, MD  02/05/2024, 1:32 PM  Advanced Heart Failure Team Pager [phone] (M-F; 7a - 5p)  Please contact [facility] for night-coverage after hours (5p -7a ) and weekends on [email or web address]

## 2024-02-05 NOTE — Progress Notes (Signed)
      [address]       [place] [address]             [phone]    Reintubated this afternoon  BP 126/68   Pulse 66   Temp 98.6 F (37 C)   Resp 20   Ht 6\' 1"  (1.854 m)   Wt 130 kg   SpO2 100%   BMI 37.81 kg/m    30/15, CI 1.8 Epi 2, vasopressin  0.01 Amiodarone  60   Intake/Output Summary (Last 24 hours) at 02/05/2024 1940 Last data filed at 02/05/2024 1930 Gross per 24 hour  Intake 4040.83 ml  Output 8005 ml  Net -3964.17 ml   K 3.5 Hct 23  Safiatou Islam C. Luna Salinas, MD [facility] [phone]

## 2024-02-05 NOTE — Progress Notes (Signed)
 PT Cancellation Note  Patient Details Name: Dustin Davenport MRN: [id number] DOB: [birth year]   Cancelled Treatment:    Reason Eval/Treat Not Completed: Patient at procedure or test/unavailable. Pt with staff in room for procedure. Acute PT to re-attempt as schedule allows  Orysia Blas, PT, DPT Secure Chat Preferred  Rehab Office [phone]   [name] 02/05/2024, 11:08 AM

## 2024-02-05 NOTE — Progress Notes (Signed)
 RT assisted with intubation and bedside bronchoscopy.

## 2024-02-05 NOTE — Progress Notes (Signed)
   NAMEMINER KORAL, MRN:  [id number], DOB:  [birth year], LOS: 4 ADMISSION DATE:  02/01/2024, CONSULTATION DATE:  02/03/24 REFERRING MD:  Sherene Dilling, CHIEF COMPLAINT:  chest pain   History of Present Illness:  68 year old man w/ hx of HTN, obesity who presented on 4/15 with sudden onset chest pain.  EKG showing inferior STEMI and went to cath lab for RCA DES x 3.  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  Taken emergently to OR for Bentall+ ascending/arch replacement.  Procedure complicated by coagulopathy related to antiplatelet therapy from recent stent, multiple liters of blood products given.  Returned to ICU 4/16 on vent and PCCM consulted to assist with vent wean on 4/17.  Currently heavily sedated; attempts at weaning resulted in agitation without ability to redirect.  Pertinent  Medical History   Past Medical History:  Diagnosis Date   Hypertension      Significant Hospital Events: Including procedures, antibiotic start and stop dates in addition to other pertinent events   4/16 RCA stent then Bentall, aortic arch replacement 4/18 extubated  Interim History / Subjective:  Delirious, low grade fevers Diuresed well  Objective   Blood pressure (!) 112/91, pulse 98, temperature (!) 100.4 F (38 C), resp. rate 19, height 6\' 1"  (1.854 m), weight 130 kg, SpO2 97%. PAP: (18-43)/(5-21) 30/15 CVP:  [0 mmHg-11 mmHg] 6 mmHg CO:  [5.2 L/min-7.7 L/min] 5.3 L/min CI:  [2.18 L/min/m2-3.17 L/min/m2] 2.18 L/min/m2  FiO2 (%):  [40 %-70 %] 60 %   Intake/Output Summary (Last 24 hours) at 02/05/2024 0272 Last data filed at 02/05/2024 0900 Gross per 24 hour  Intake 3720.81 ml  Output 8060 ml  Net -4339.19 ml   Filed Weights   02/03/24 0500 02/04/24 0600 02/05/24 5366  Weight: 134.9 kg 134.8 kg 130 kg    Examination: No distress Moves to command but weak Expressive aphasia Agitated Sternotomy  looks good Minimal drain output  Kidneys holding steady Shock liver stable K being replaced WBC, hgb, plts stable  Resolved Hospital Problem list   N/A  Assessment & Plan:  Concurrent inferior STEMI and ascending aortic dissection requiring RCA stent followed by emergent aortic reconstruction and aortic root replacement 4/16.  Involvement of renal artery noted. Postoperative ventilator management- resolved At risk ileus Postoperative acute kidney injury Postoperative encephalopathy- delirium and pain; would not be surprised if has some CVAs from the surgery but will see how goes; noted expressive aphasia today Postoperative ABLA, coagulopathy Postoperative vasoplegic and hemorrhagic shock Hx HTN, obesity Postoperative hyperglycemia  - Abx ppx, drain management per primary - Avoid nephrotoxins, high risk for progression to renal replacement need; push diuresis again today - Starting reglan , watch for s/s of ileus, NGT PRN - treat pain first then adjust precedex ; standing tylenol  and robaxin  ordered - basal bolus insulin  ordered - Starting PR aspirin  today, eventually will need DAPT  Best Practice (right click and "Reselect all SmartList Selections" daily)   Diet/type: NPO DVT prophylaxis per TCTS Pressure ulcer(s): N/A GI prophylaxis: PPI Lines: Central line Foley:  Yes, and it is still needed Code Status:  full code Last date of multidisciplinary goals of care discussion [updated at bedside]  34 min cc time Ardelle Kos

## 2024-02-05 NOTE — Procedures (Signed)
 Bronchoscopy Procedure Note  Dustin Davenport  [id number]  [birth year]  Date:02/05/24  Time:3:54 PM   Provider Performing:Taneasha Fuqua C Kavaughn Faucett   Procedure(s):  Flexible Bronchoscopy [id number]), Flexible bronchoscopy with bronchial alveolar lavage (62130), and Initial Therapeutic Aspiration of Tracheobronchial Tree (86578)  Indication(s) Mucus plugging of bronchi  Consent Unable to obtain consent due to emergent nature of procedure.  Anesthesia In place for ETT   Time Out Verified patient identification, verified procedure, site/side was marked, verified correct patient position, special equipment/implants available, medications/allergies/relevant history reviewed, required imaging and test results available.   Sterile Technique Usual hand hygiene, masks, gowns, and gloves were used   Procedure Description Bronchoscope advanced through endotracheal tube and into airway.  Airways were examined down to subsegmental level with findings noted below.    Findings:  - Mucopurulent material completely occluding both mainstem bronchi - Underlying bronchial mucosa red/inflammed - Additional purulent material found in lower lobes, BAL performed with purulent return in LLL   Complications/Tolerance None; patient tolerated the procedure well. Chest X-ray is needed post procedure.   EBL Minimal   Specimen(s) LLL BAL

## 2024-02-05 NOTE — Procedures (Signed)
 Intubation Procedure Note  Daneil R Grauberger  [id number]  Jul 10, 1956  Date:02/05/24  Time:3:53 PM   Provider Performing:Nica Friske C Delayna Sparlin    Procedure: Intubation (31500)  Indication(s) Respiratory Failure  Consent Unable to obtain consent due to emergent nature of procedure.   Anesthesia Etomidate , Versed , and Fentanyl    Time Out Verified patient identification, verified procedure, site/side was marked, verified correct patient position, special equipment/implants available, medications/allergies/relevant history reviewed, required imaging and test results available.   Sterile Technique Usual hand hygeine, masks, and gloves were used   Procedure Description Patient positioned in bed supine.  Sedation given as noted above.  Patient was intubated with endotracheal tube using Glidescope.  View was Grade 1 full glottis .  Number of attempts was 1.  Colorimetric CO2 detector was consistent with tracheal placement.   Complications/Tolerance Thick mucupurulent material seen around and within cords Chest X-ray is ordered to verify placement.   EBL Minimal   Specimen(s) None

## 2024-02-05 NOTE — Progress Notes (Signed)
 4 Days Post-Op Procedure(s) (LRB): REPAIR OF ASCENDING AORTIC ANEURYSM USING A 32X12X10X10X10MM HEMASHIELD PLATINUM GRAFT. (N/A) ECHOCARDIOGRAM, TRANSESOPHAGEAL, INTRAOPERATIVE (N/A) BENTALL PROCEDURE USING A KONECT RESILIA AORTIC VALVE CONDUIT (N/A) Subjective: Moving spontaneously, intermittently following simple commands  Moving all 4 ext  Objective: Vital signs in last 24 hours: Temp:  [99 F (37.2 C)-101.1 F (38.4 C)] 100.4 F (38 C) (04/19 0845) Pulse Rate:  [76-112] 98 (04/19 0845) Cardiac Rhythm: Atrial fibrillation (04/19 0400) Resp:  [9-37] 19 (04/19 0845) BP: (111-127)/(68-99) 112/91 (04/19 0845) SpO2:  [83 %-100 %] 97 % (04/19 0845) Arterial Line BP: (31-147)/(13-104) 31/13 (04/18 1700) FiO2 (%):  [40 %-70 %] 60 % (04/19 0832) Weight:  [130 kg] 130 kg (04/19 0621)  Hemodynamic parameters for last 24 hours: PAP: (18-43)/(5-21) 30/15 CVP:  [0 mmHg-11 mmHg] 6 mmHg CO:  [5.2 L/min-7.7 L/min] 5.3 L/min CI:  [2.18 L/min/m2-3.17 L/min/m2] 2.18 L/min/m2  Intake/Output from previous day: 04/18 0701 - 04/19 0700 In: 3951.4 [I.V.:1994.5; Blood:731.7; IV Piggyback:1225.2] Out: 8440 [Urine:7400; Chest Tube:1040] Intake/Output this shift: Total I/O In: 69.7 [I.V.:69.7] Out: 260 [Urine:240; Chest Tube:20]  General appearance: delirious Neurologic: unintelligible speech, moves all 4 Heart: irregularly irregular rhythm Lungs: diminished breath sounds bibasilar Abdomen: distended, hypoactive BS  Lab Results: Recent Labs    02/04/24 2359 02/05/24 0031 02/05/24 0502 02/05/24 0511  WBC 18.4*  --   --  18.7*  HGB 8.7*   < > 8.2* 8.3*  HCT 26.0*   < > 24.0* 25.0*  PLT 82*  --   --  86*   < > = values in this interval not displayed.   BMET:  Recent Labs    02/05/24 0032 02/05/24 0502 02/05/24 0511  NA 143 145 145  K 3.9 3.6 3.5  CL 99  --  100  CO2 30  --  30  GLUCOSE 141*  --  130*  BUN 49*  --  50*  CREATININE 3.17*  --  3.22*  CALCIUM  8.1*  --  8.5*     PT/INR:  Recent Labs    02/03/24 0806  LABPROT 18.0*  INR 1.5*   ABG    Component Value Date/Time   PHART 7.517 (H) 02/05/2024 0502   HCO3 32.3 (H) 02/05/2024 0502   TCO2 33 (H) 02/05/2024 0502   ACIDBASEDEF 4.0 (H) 02/02/2024 0859   O2SAT 48.5 02/05/2024 0511   CBG (last 3)  Recent Labs    02/05/24 0004 02/05/24 0500 02/05/24 0750  GLUCAP 144* 138* 117*    Assessment/Plan: S/P Procedure(s) (LRB): REPAIR OF ASCENDING AORTIC ANEURYSM USING A 32X12X10X10X10MM HEMASHIELD PLATINUM GRAFT. (N/A) ECHOCARDIOGRAM, TRANSESOPHAGEAL, INTRAOPERATIVE (N/A) BENTALL PROCEDURE USING A KONECT RESILIA AORTIC VALVE CONDUIT (N/A) POD # 4 NEURO- intermittently following simple commands, responds to name  Spont unintelligible speech  Moving spontaneously, restraints PRN for safety  Risk of transport too high for head CT CV- in atrial fib with rate low 110 range  On amiodarone  drip   Co-ox 48 on vasopressin  0.04, Epi 2, off norepi  Will check limited echo, discuss with AHF team RESP- on HFNC at 30 L, not able to do IS  CXR shows atelectasis + effusions RENAL- diuresed well yesterday, over 4L negative  Creatinine stable at 3.2  Hypokalemia- supplement cautiously ENDO- CBG mildly elevated Gi- Liver enzymes elevated Leukocytosis and Febrile to 101- likely reactive, monitor Anemia secondary to ABL- Hgb stable at 8.3 Thrombocytopenia- stable Coagulopathy resolved SCD for DVT prophylaxis   LOS: 4 days  Dustin Davenport 02/05/2024

## 2024-02-05 NOTE — Progress Notes (Addendum)
 02/05/2024 Called to bedside for sudden worsening O2 sats Gurgling respiration and less responsive Sats low 90s on 50LPM 100% Shallow insp effort Intubated and bronch'd: horrible mucopurulent secretions: aspiration vs HCAP Start vanc/meropenem , send culture Updated family after. Updated TCTS. AHF at bedside.  Ardelle Kos MD PCCM

## 2024-02-05 NOTE — Plan of Care (Signed)
  Problem: Clinical Measurements: Goal: Cardiovascular complication will be avoided Outcome: Progressing   Problem: Clinical Measurements: Goal: Ability to maintain clinical measurements within normal limits will improve Outcome: Not Progressing Goal: Will remain free from infection Outcome: Not Progressing Goal: Diagnostic test results will improve Outcome: Not Progressing Goal: Respiratory complications will improve Outcome: Not Progressing   Problem: Activity: Goal: Risk for activity intolerance will decrease Outcome: Not Progressing   Problem: Safety: Goal: Ability to remain free from injury will improve Outcome: Not Progressing   Problem: Cardiac: Goal: Ability to achieve and maintain adequate cardiovascular perfusion will improve Outcome: Not Progressing

## 2024-02-05 NOTE — Progress Notes (Signed)
  Echocardiogram 2D Echocardiogram has been performed.  Dione Franks 02/05/2024, 10:36 AM

## 2024-02-06 ENCOUNTER — Inpatient Hospital Stay (HOSPITAL_COMMUNITY)

## 2024-02-06 DIAGNOSIS — I2111 ST elevation (STEMI) myocardial infarction involving right coronary artery: Secondary | ICD-10-CM | POA: Diagnosis not present

## 2024-02-06 DIAGNOSIS — K567 Ileus, unspecified: Secondary | ICD-10-CM

## 2024-02-06 DIAGNOSIS — G9340 Encephalopathy, unspecified: Secondary | ICD-10-CM | POA: Diagnosis not present

## 2024-02-06 DIAGNOSIS — N179 Acute kidney failure, unspecified: Secondary | ICD-10-CM | POA: Diagnosis not present

## 2024-02-06 DIAGNOSIS — R570 Cardiogenic shock: Secondary | ICD-10-CM | POA: Diagnosis not present

## 2024-02-06 DIAGNOSIS — I4891 Unspecified atrial fibrillation: Secondary | ICD-10-CM | POA: Diagnosis not present

## 2024-02-06 DIAGNOSIS — R739 Hyperglycemia, unspecified: Secondary | ICD-10-CM | POA: Diagnosis not present

## 2024-02-06 DIAGNOSIS — I7101 Dissection of ascending aorta: Secondary | ICD-10-CM | POA: Diagnosis not present

## 2024-02-06 LAB — MAGNESIUM: Magnesium: 2 mg/dL (ref 1.7–2.4)

## 2024-02-06 LAB — GLUCOSE, CAPILLARY
Glucose-Capillary: 106 mg/dL — ABNORMAL HIGH (ref 70–99)
Glucose-Capillary: 117 mg/dL — ABNORMAL HIGH (ref 70–99)
Glucose-Capillary: 70 mg/dL (ref 70–99)
Glucose-Capillary: 74 mg/dL (ref 70–99)
Glucose-Capillary: 75 mg/dL (ref 70–99)
Glucose-Capillary: 78 mg/dL (ref 70–99)
Glucose-Capillary: 83 mg/dL (ref 70–99)
Glucose-Capillary: 91 mg/dL (ref 70–99)

## 2024-02-06 LAB — RENAL FUNCTION PANEL
Albumin: 3 g/dL — ABNORMAL LOW (ref 3.5–5.0)
Anion gap: 16 — ABNORMAL HIGH (ref 5–15)
BUN: 57 mg/dL — ABNORMAL HIGH (ref 8–23)
CO2: 29 mmol/L (ref 22–32)
Calcium: 8.4 mg/dL — ABNORMAL LOW (ref 8.9–10.3)
Chloride: 98 mmol/L (ref 98–111)
Creatinine, Ser: 2.98 mg/dL — ABNORMAL HIGH (ref 0.61–1.24)
GFR, Estimated: 22 mL/min — ABNORMAL LOW (ref >60)
Glucose, Bld: 102 mg/dL — ABNORMAL HIGH (ref 70–99)
Phosphorus: 3.5 mg/dL (ref 2.5–4.6)
Potassium: 2.9 mmol/L — ABNORMAL LOW (ref 3.5–5.1)
Sodium: 143 mmol/L (ref 135–145)

## 2024-02-06 LAB — CBC
HCT: 23.8 % — ABNORMAL LOW (ref 39.0–52.0)
Hemoglobin: 7.9 g/dL — ABNORMAL LOW (ref 13.0–17.0)
MCH: 28.7 pg (ref 26.0–34.0)
MCHC: 33.2 g/dL (ref 30.0–36.0)
MCV: 86.5 fL (ref 80.0–100.0)
Platelets: 101 10*3/uL — ABNORMAL LOW (ref 150–400)
RBC: 2.75 MIL/uL — ABNORMAL LOW (ref 4.22–5.81)
RDW: 17.3 % — ABNORMAL HIGH (ref 11.5–15.5)
WBC: 16.6 10*3/uL — ABNORMAL HIGH (ref 4.0–10.5)
nRBC: 4.9 % — ABNORMAL HIGH (ref 0.0–0.2)

## 2024-02-06 LAB — COOXEMETRY PANEL
Carboxyhemoglobin: 1.2 % (ref 0.5–1.5)
Methemoglobin: <0.7 % (ref 0.0–1.5)
O2 Saturation: 56.3 %
Total hemoglobin: 8.2 g/dL — ABNORMAL LOW (ref 12.0–16.0)

## 2024-02-06 LAB — COMPREHENSIVE METABOLIC PANEL WITH GFR
ALT: 123 U/L — ABNORMAL HIGH (ref 0–44)
AST: 739 U/L — ABNORMAL HIGH (ref 15–41)
Albumin: 3 g/dL — ABNORMAL LOW (ref 3.5–5.0)
Alkaline Phosphatase: 62 U/L (ref 38–126)
Anion gap: 16 — ABNORMAL HIGH (ref 5–15)
BUN: 57 mg/dL — ABNORMAL HIGH (ref 8–23)
CO2: 29 mmol/L (ref 22–32)
Calcium: 8.4 mg/dL — ABNORMAL LOW (ref 8.9–10.3)
Chloride: 98 mmol/L (ref 98–111)
Creatinine, Ser: 2.86 mg/dL — ABNORMAL HIGH (ref 0.61–1.24)
GFR, Estimated: 23 mL/min — ABNORMAL LOW (ref >60)
Glucose, Bld: 102 mg/dL — ABNORMAL HIGH (ref 70–99)
Potassium: 2.9 mmol/L — ABNORMAL LOW (ref 3.5–5.1)
Sodium: 143 mmol/L (ref 135–145)
Total Bilirubin: 1.4 mg/dL — ABNORMAL HIGH (ref 0.0–1.2)
Total Protein: 5 g/dL — ABNORMAL LOW (ref 6.5–8.1)

## 2024-02-06 LAB — VANCOMYCIN, RANDOM: Vancomycin Rm: 17 ug/mL

## 2024-02-06 LAB — PROCALCITONIN: Procalcitonin: 3.13 ng/mL

## 2024-02-06 LAB — PHOSPHORUS: Phosphorus: 3.6 mg/dL (ref 2.5–4.6)

## 2024-02-06 MED ORDER — BISACODYL 10 MG RE SUPP
10.0000 mg | Freq: Every day | RECTAL | Status: DC
Start: 2024-02-07 — End: 2024-02-09
  Administered 2024-02-07: 10 mg via RECTAL
  Filled 2024-02-06 (×2): qty 1

## 2024-02-06 MED ORDER — BISACODYL 10 MG RE SUPP
10.0000 mg | Freq: Once | RECTAL | Status: AC
  Administered 2024-02-06: 10 mg via RECTAL
  Filled 2024-02-06: qty 1

## 2024-02-06 MED ORDER — POTASSIUM CHLORIDE 10 MEQ/50ML IV SOLN
10.0000 meq | INTRAVENOUS | Status: AC
  Administered 2024-02-06 (×3): 10 meq via INTRAVENOUS
  Filled 2024-02-06 (×3): qty 50

## 2024-02-06 MED ORDER — VANCOMYCIN HCL IN DEXTROSE 1-5 GM/200ML-% IV SOLN
1000.0000 mg | Freq: Once | INTRAVENOUS | Status: AC
  Administered 2024-02-06: 1000 mg via INTRAVENOUS
  Filled 2024-02-06: qty 200

## 2024-02-06 MED ORDER — POTASSIUM CHLORIDE 10 MEQ/100ML IV SOLN
10.0000 meq | INTRAVENOUS | Status: DC
Start: 2024-02-06 — End: 2024-02-06
  Filled 2024-02-06: qty 100

## 2024-02-06 MED ORDER — SORBITOL 70 % SOLN
30.0000 mL | Freq: Once | Status: AC
  Administered 2024-02-06: 30 mL
  Filled 2024-02-06: qty 30

## 2024-02-06 MED ORDER — POTASSIUM CHLORIDE 10 MEQ/50ML IV SOLN
10.0000 meq | INTRAVENOUS | Status: AC
  Administered 2024-02-06 (×6): 10 meq via INTRAVENOUS
  Filled 2024-02-06 (×5): qty 50

## 2024-02-06 MED ORDER — INSULIN ASPART 100 UNIT/ML IJ SOLN
0.0000 [IU] | INTRAMUSCULAR | Status: DC
  Administered 2024-02-07: 2 [IU] via SUBCUTANEOUS
  Administered 2024-02-07 (×2): 1 [IU] via SUBCUTANEOUS
  Administered 2024-02-08 (×2): 2 [IU] via SUBCUTANEOUS
  Administered 2024-02-08: 1 [IU] via SUBCUTANEOUS
  Administered 2024-02-08: 2 [IU] via SUBCUTANEOUS
  Administered 2024-02-08 – 2024-02-09 (×5): 1 [IU] via SUBCUTANEOUS
  Administered 2024-02-09: 2 [IU] via SUBCUTANEOUS
  Administered 2024-02-10: 1 [IU] via SUBCUTANEOUS
  Administered 2024-02-10: 2 [IU] via SUBCUTANEOUS

## 2024-02-06 NOTE — Progress Notes (Signed)
 5 Days Post-Op Procedure(s) (LRB): REPAIR OF ASCENDING AORTIC ANEURYSM USING A 32X12X10X10X10MM HEMASHIELD PLATINUM GRAFT. (N/A) ECHOCARDIOGRAM, TRANSESOPHAGEAL, INTRAOPERATIVE (N/A) BENTALL PROCEDURE USING A KONECT RESILIA AORTIC VALVE CONDUIT (N/A) Subjective: Intubated, sedated Does follow simple commands with all 4 ext  Objective: Vital signs in last 24 hours: Temp:  [97.9 F (36.6 C)-100.2 F (37.9 C)] 98.8 F (37.1 C) (04/20 0900) Pulse Rate:  [49-101] 66 (04/20 0900) Cardiac Rhythm: Normal sinus rhythm (04/20 0800) Resp:  [14-31] 20 (04/20 0900) BP: (86-135)/(49-81) 126/68 (04/19 1535) SpO2:  [88 %-100 %] 100 % (04/20 0900) Arterial Line BP: (94-181)/(50-73) 113/59 (04/20 0900) FiO2 (%):  [50 %-100 %] 50 % (04/20 0855) Weight:  [129.8 kg] 129.8 kg (04/20 0356)  Hemodynamic parameters for last 24 hours: PAP: (25-43)/(9-27) 33/15 CVP:  [2 mmHg-19 mmHg] 8 mmHg CO:  [4.3 L/min-6.4 L/min] 4.7 L/min CI:  [1.8 L/min/m2-2.65 L/min/m2] 1.94 L/min/m2  Intake/Output from previous day: 04/19 0701 - 04/20 0700 In: 3990.1 [I.V.:2189.4; IV Piggyback:1800.7] Out: 6420 [Urine:6220; Chest Tube:200] Intake/Output this shift: Total I/O In: 216.8 [I.V.:156.4; NG/GT:20; IV Piggyback:40.4] Out: 320 [Urine:300; Chest Tube:20]  General appearance: intubated Neurologic: nonfocal Heart: regular rate and rhythm Lungs: clear to auscultation bilaterally Abdomen: normal findings: soft, non-tender  Lab Results: Recent Labs    02/05/24 0511 02/05/24 1642 02/06/24 0335  WBC 18.7*  --  16.6*  HGB 8.3* 7.8* 7.9*  HCT 25.0* 23.0* 23.8*  PLT 86*  --  101*   BMET:  Recent Labs    02/05/24 0511 02/05/24 1642 02/06/24 0335  NA 145 143 143  143  K 3.5 3.5 2.9*  2.9*  CL 100  --  98  98  CO2 30  --  29  29  GLUCOSE 130*  --  102*  102*  BUN 50*  --  57*  57*  CREATININE 3.22*  --  2.86*  2.98*  CALCIUM  8.5*  --  8.4*  8.4*    PT/INR: No results for input(s): "LABPROT",  "INR" in the last 72 hours. ABG    Component Value Date/Time   PHART 7.550 (H) 02/05/2024 1642   HCO3 32.0 (H) 02/05/2024 1642   TCO2 33 (H) 02/05/2024 1642   ACIDBASEDEF 4.0 (H) 02/02/2024 0859   O2SAT 56.3 02/06/2024 0334   CBG (last 3)  Recent Labs    02/06/24 0006 02/06/24 0343 02/06/24 0743  GLUCAP 91 106* 83    Assessment/Plan: S/P Procedure(s) (LRB): REPAIR OF ASCENDING AORTIC ANEURYSM USING A 32X12X10X10X10MM HEMASHIELD PLATINUM GRAFT. (N/A) ECHOCARDIOGRAM, TRANSESOPHAGEAL, INTRAOPERATIVE (N/A) BENTALL PROCEDURE USING A KONECT RESILIA AORTIC VALVE CONDUIT (N/A) POD # 5 NEURO- follows commands but not alert enough to protect airway.  CV- in SR on amiodarone  BP Ok on epi @ 2. Co-ox 56, Ci 1.9 RESP- reintubated yesterday for aspiration  PRVC 20/50%/ 8 PEEP  CXR shows bibasilar atelectasis, small effusions  Vent per CCM ID- Afebrile but witnessed aspiration   WBC up to 18.7, PCT 3.13 started on meropenem  and vancomycin - day 2 RENAL- creatinine improved at 2.9   Hypokalemia- being supplemented GI- NPO, will start trickle Tf this AM Anemia- Hgb 7.9, stable Thrombocytopenia improved Dc chest tubes   LOS: 5 days    Dustin Davenport 02/06/2024

## 2024-02-06 NOTE — Progress Notes (Signed)
      [address]       [place], [address]             [phone]    POD # 5  Intubated sedated BP 126/68   Pulse 70   Temp 99.7 F (37.6 C)   Resp (!) 22   Ht 6\' 1"  (1.854 m)   Wt 129.8 kg   SpO2 96%   BMI 37.75 kg/m  34/16 CI 2.4 Epi 2, vasopressin  0.01 PRVC 20/50%/8   Intake/Output Summary (Last 24 hours) at 02/06/2024 1856 Last data filed at 02/06/2024 1800 Gross per 24 hour  Intake 3189.53 ml  Output 4690 ml  Net -1500.47 ml   Continue current care  Renly Guedes C. Luna Salinas, MD [facility] [phone]

## 2024-02-06 NOTE — Progress Notes (Addendum)
 Advanced Heart Failure Rounding Note  Cardiologist: None  Chief Complaint: STEMI> Cardiogenic shock> Now s/p aortic dissection repair + Bentall  Significant events:    - Echo 4/15: AAA measuring 5-5.7 cm. Followed by emergent TEE 4/15 which was significant for torrential aortic regurgitation and dissection flap extending from the aortic root to the proximal ascending aorta  - Emergent TEE 4/15: significant for torrential aortic regurgitation and dissection flap extending from the aortic root to the proximal ascending aorta  - CT C/A/P: Showed acute type A dissection with a 5.5 cm ascending aortic aneurysm extending into arch. Dissection goes down to infrarenal aorta and extends up into innominate and left common carotid arterie  - Taken to OR 4/15: Now s/p aortic dissection repair 02/02/24 (Bentall/AVR and reimplantation of coronaries. Intra-op course c/b coagulopathy due to Effient  (PCI of RCA earlier in the day)).  - Extubated 4/18 - Echo EF 20-25% RV severely down. AVR ok - Re-intubated 4/19. Bronch with diffuse mucopurulent secretions  Subjective:    Re-intubated yeserday. Bronch with diffuse mucopurulent secretions. On vanc/mero now  Remains on epi 2 and VP 0.02  Remains sedated on vent. Diuresed well with IV lasix .  RA 8  PA 32/15 (20)  PCW 8 Thermo 4.8/2.0 SVR 1087  Objective:    Weight Range: 129.8 kg Body mass index is 37.75 kg/m.   Vital Signs:   Temp:  [97.9 F (36.6 C)-99.5 F (37.5 C)] 99.3 F (37.4 C) (04/20 1230) Pulse Rate:  [56-80] 71 (04/20 1230) Resp:  [0-29] 20 (04/20 1215) BP: (111-126)/(66-71) 126/68 (04/19 1535) SpO2:  [88 %-100 %] 99 % (04/20 1230) Arterial Line BP: (94-181)/(50-73) 151/64 (04/20 1230) FiO2 (%):  [50 %-100 %] 50 % (04/20 1309) Weight:  [129.8 kg] 129.8 kg (04/20 0356) Last BM Date :  (PTA)  Weight change: Filed Weights   02/04/24 0600 02/05/24 0621 02/06/24 0356  Weight: 134.8 kg 130 kg 129.8 kg    Intake/Output:  Intake/Output Summary (Last 24 hours) at 02/06/2024 1328 Last data filed at 02/06/2024 1300 Gross per 24 hour  Intake 3673.43 ml  Output 6005 ml  Net -2331.57 ml    Physical Exam   General:  Intubated/sedated HEENT: + ETT Neck: supple. _ RIJ swan Cor: Regular rate & rhythm. No rubs, gallops or murmurs. Lungs: coarse Abdomen: obese soft, nontender, nondistended. Hypoactive bowel sounds. Extremities: no cyanosis, clubbing, rash,1+ edema Neuro: intubated/sedated  Telemetry   SR 70s Personally reviewed  Labs    CBC Recent Labs    02/05/24 0511 02/05/24 1642 02/06/24 0335  WBC 18.7*  --  16.6*  HGB 8.3* 7.8* 7.9*  HCT 25.0* 23.0* 23.8*  MCV 86.2  --  86.5  PLT 86*  --  101*   Basic Metabolic Panel Recent Labs    21/30/86 0032 02/05/24 0502 02/05/24 0511 02/05/24 1642 02/06/24 0335  NA 143   < > 145 143 143  143  K 3.9   < > 3.5 3.5 2.9*  2.9*  CL 99  --  100  --  98  98  CO2 30  --  30  --  29  29  GLUCOSE 141*  --  130*  --  102*  102*  BUN 49*  --  50*  --  57*  57*  CREATININE 3.17*  --  3.22*  --  2.86*  2.98*  CALCIUM  8.1*  --  8.5*  --  8.4*  8.4*  MG 2.2  --   --   --  2.0  PHOS  --   --  3.7  --  3.6  3.5   < > = values in this interval not displayed.   Liver Function Tests Recent Labs    02/05/24 0511 02/06/24 0335  AST 689* 739*  ALT 70* 123*  ALKPHOS 45 62  BILITOT 1.0 1.4*  PROT 5.3* 5.0*  ALBUMIN  3.3* 3.0*  3.0*   D-Dimer No results for input(s): "DDIMER" in the last 72 hours.  Hemoglobin A1C No results for input(s): "HGBA1C" in the last 72 hours.  Imaging   DG Chest Port 1 View Result Date: 02/06/2024 CLINICAL DATA:  Shortness of breath. EXAM: PORTABLE CHEST 1 VIEW COMPARISON:  02/05/2024 FINDINGS: ETT tip is stable above the carina. Enteric tube courses below the GE junction. Pulmonary arterial catheter is in the proximal right main pulmonary artery. Mediastinal drain is unchanged in position. Status  post aortic valve replacement. Unchanged asymmetric elevation of the right hemidiaphragm. Persistent small pleural effusions and bibasilar atelectasis/consolidation. No new findings. IMPRESSION: 1. Stable support apparatus. 2. Persistent small pleural effusions and bibasilar atelectasis/consolidation. 3. No new findings. Electronically Signed   By: Kimberley Penman M.D.   On: 02/06/2024 08:08   DG Abd 1 View Result Date: 02/05/2024 CLINICAL DATA:  OG tube EXAM: ABDOMEN - 1 VIEW COMPARISON:  None Available. FINDINGS: Enteric tube tip and side port overlie the body of the stomach. Metallic and wirelike densities over the central abdomen. Drainage catheters over the lower chest/epigastric area IMPRESSION: Enteric tube tip and side port overlie the body of the stomach. Electronically Signed   By: Esmeralda Hedge M.D.   On: 02/05/2024 19:03   DG Chest Port 1 View Result Date: 02/05/2024 CLINICAL DATA:  Intubated EXAM: PORTABLE CHEST 1 VIEW COMPARISON:  02/05/2024, 02/04/2024, 02/03/2024 FINDINGS: Endotracheal tube tip is about 2.4 cm superior to the carina. Enteric tube tip below the diaphragm but incompletely assessed. Right IJ Swan-Ganz catheter tip over the right pulmonary artery. Valve prosthesis. Cardiomegaly with similar bilateral effusions and bibasilar consolidations. Slight decreased vascular congestion. No definitive pneumothorax. IMPRESSION: 1. Cardiomegaly with similar bilateral effusions and bibasilar consolidations. Slight decreased vascular congestion. 2. Re-intubation with tip of endotracheal tube about 2.4 cm superior to carina. Enteric tube tip below the diaphragm but incompletely assessed Electronically Signed   By: Esmeralda Hedge M.D.   On: 02/05/2024 19:02   Medications:    Scheduled Medications:  aspirin   300 mg Rectal Daily   [START ON 02/07/2024] bisacodyl   10 mg Rectal Q0600   Chlorhexidine  Gluconate Cloth  6 each Topical Daily   insulin  aspart  0-9 Units Subcutaneous Q4H   methocarbamol   (ROBAXIN ) injection  500 mg Intravenous Q8H   metoCLOPramide  (REGLAN ) injection  5 mg Intravenous Q8H   mupirocin  ointment  1 Application Nasal BID   mouth rinse  15 mL Mouth Rinse Q2H   pantoprazole  (PROTONIX ) IV  40 mg Intravenous QHS   sodium chloride  flush  10-40 mL Intracatheter Q12H   sodium chloride  flush  3 mL Intravenous Q12H   sodium chloride  flush  3-10 mL Intravenous Q12H   vancomycin  variable dose per unstable renal function (pharmacist dosing)   Does not apply See admin instructions   Infusions:  amiodarone  30 mg/hr (02/06/24 1300)   dexmedetomidine  (PRECEDEX ) IV infusion 0.7 mcg/kg/hr (02/06/24 1300)   epinephrine  2 mcg/min (02/06/24 1300)   fentaNYL  infusion INTRAVENOUS 100 mcg/hr (02/06/24 1300)   meropenem  (MERREM ) IV Stopped (02/06/24 0429)   norepinephrine  (LEVOPHED ) Adult infusion Stopped (02/05/24  7846)   potassium chloride  10 mEq (02/06/24 1315)   vasopressin  0.01 Units/min (02/06/24 1300)   PRN Medications: fentaNYL , metoprolol  tartrate, midazolam , ondansetron  (ZOFRAN ) IV, mouth rinse, sodium chloride  flush, sodium chloride  flush, sodium chloride  flush  Patient Profile   Dustin Davenport is a 68 y.o. male with HTN and HLD. Admitted with STEMI now with CGS. Found to have AAA and taken emergently to the OR. Now s/p Bentall/AVR and reimplantation of coronaries.  Assessment/Plan   Acute type A aortic dissection  - Echo 4/15: AAA measuring 5-5.7 cm - Emergent TEE 4/15: significant for torrential aortic regurgitation and dissection flap extending from the aortic root to the proximal ascending aorta  - CT C/A/P: Showed acute type A dissection with a 5.5 cm ascending aortic aneurysm extending into arch. Dissection goes down to infrarenal aorta and extends up into innominate and left common carotid arterie  - Now s/p aortic dissection repair 02/01/24 (Bentall/AVR and reimplantation of coronaries. Intra-op course c/b coagulopathy due to Effient  (PCI of RCA earlier in the  day)  2. CAD, Acute inferiro STEMI - Admitted with STEMI. Post PCI found to also have aortic dissection - LHC with single vessel occlusive CAD involving the mid to distal RCA, successful PCI of the RCA with overlapping DES x 3. - Now back on ASA. Off Effient  post-op. Start plavix  when able (had some blood from CTs on 4/18 so holding off)  - Hold statin d/t rising LFTs (LDL 93. Goal <70). Restart soon - No s/s angina  3. Acute systolic HF ->  Cardiogenic shock - Post perfusion CGS with wide pulse pressure - Echo 4/19: EF 25% RV moderate to severely decreased - Co-ox 56%. On Epi 2 + VP 0.02.   - Swan numbers improved. CO still marginal. Wean VP. Titrate epi as need to maintain output - Weight still up but filling pressures low. Hold off on lasix  today  4. Post-op hypoxic resp failure - extubated 4/18.  - re-intubated 4/19 - diffuse mucopurulent secretions on bronch 4/19. BAL GS: Mod WBC. Few gram variable rods. Rare yeast - Now on vanc/mero. Follow cultures - CCM following   5. PAF with post-termination pauses PVCs - Patient with AF intra-op.  - Now back in NSR. Continue IV amio   6. Hypokalemia - K 2.9. will supp   7. AKI due to ATN/shock - Baseline SCr ~1.3-1.4 - SCr peaked at 3.2/ Improved to 2.9 today - As above. Hold lasix  today  8. ABLA post-op - Hgb 8.3 -. 7.9 today - Received 6uPRBCs, 6 plt, 4 FFP and 5 cryo intra-op.   - Follow H&H. Keep hgb >8.    CRITICAL CARE Performed by: Jules Oar  Total critical care time: 50 minutes  Critical care time was exclusive of separately billable procedures and treating other patients.  Critical care was necessary to treat or prevent imminent or life-threatening deterioration.  Critical care was time spent personally by me on the following activities: development of treatment plan with patient and/or surrogate as well as nursing, discussions with consultants, evaluation of patient's response to treatment, examination of  patient, obtaining history from patient or surrogate, ordering and performing treatments and interventions, ordering and review of laboratory studies, ordering and review of radiographic studies, pulse oximetry and re-evaluation of patient's condition.    Length of Stay: 5  Jules Oar, MD  02/06/2024, 1:28 PM  Advanced Heart Failure Team Pager [phone] (M-F; 7a - 5p)  Please contact [facility] for night-coverage after hours (5p -  7a ) and weekends on [email or web address]

## 2024-02-06 NOTE — Plan of Care (Signed)
  Problem: Clinical Measurements: Goal: Ability to maintain clinical measurements within normal limits will improve Outcome: Progressing Goal: Diagnostic test results will improve Outcome: Progressing   Problem: Clinical Measurements: Goal: Will remain free from infection Outcome: Not Progressing Goal: Respiratory complications will improve Outcome: Not Progressing Goal: Cardiovascular complication will be avoided Outcome: Not Progressing   Problem: Activity: Goal: Risk for activity intolerance will decrease Outcome: Not Progressing   Problem: Nutrition: Goal: Adequate nutrition will be maintained Outcome: Not Progressing

## 2024-02-06 NOTE — Progress Notes (Signed)
 PT Cancellation Note  Patient Details Name: Dustin Davenport MRN: [id number] DOB: 1956-08-01   Cancelled Treatment:    Reason Eval/Treat Not Completed: Medical issues which prohibited therapy (Pt with re-intubation 4/19 and currently sedated per RN. Acute PT to re-attempt as pt is medically stable and able to participate in therapy session. Acute PT to follow.)  Ayane Delancey W, PT, DPT Secure Chat Preferred  Rehab Office [phone]   [name] 02/06/2024, 8:00 AM

## 2024-02-06 NOTE — Progress Notes (Signed)
 NAMEUNDRA HARRIMAN, MRN:  [id number], DOB:  [birth year], LOS: 5 ADMISSION DATE:  02/01/2024, CONSULTATION DATE:  02/03/24 REFERRING MD:  Sherene Dilling, CHIEF COMPLAINT:  chest pain   History of Present Illness:  68 year old man w/ hx of HTN, obesity who presented on 4/15 with sudden onset chest pain.  EKG showing inferior STEMI and went to cath lab for RCA DES x 3.  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  Taken emergently to OR for Bentall+ ascending/arch replacement.  Procedure complicated by coagulopathy related to antiplatelet therapy from recent stent, multiple liters of blood products given.  Returned to ICU 4/16 on vent and PCCM consulted to assist with vent wean on 4/17.  Currently heavily sedated; attempts at weaning resulted in agitation without ability to redirect.  Pertinent  Medical History   Past Medical History:  Diagnosis Date   Hypertension      Significant Hospital Events: Including procedures, antibiotic start and stop dates in addition to other pertinent events   4/16 RCA stent then Bentall, aortic arch replacement 4/18 extubated 4/19 reintubated in afternoon for aspiration, bronch  Interim History / Subjective:  Much better after reintubation, peeing well  Objective   Blood pressure 126/68, pulse 66, temperature 98.8 F (37.1 C), resp. rate 20, height 6\' 1"  (1.854 m), weight 129.8 kg, SpO2 100%. PAP: (25-43)/(9-27) 33/15 CVP:  [2 mmHg-19 mmHg] 8 mmHg CO:  [4.3 L/min-6.4 L/min] 4.7 L/min CI:  [1.8 L/min/m2-2.65 L/min/m2] 1.94 L/min/m2  Vent Mode: PRVC FiO2 (%):  [50 %-100 %] 50 % Set Rate:  [20 bmp] 20 bmp Vt Set:  [640 mL] 640 mL PEEP:  [8 cmH20-10 cmH20] 8 cmH20 Plateau Pressure:  [24 cmH20-27 cmH20] 24 cmH20   Intake/Output Summary (Last 24 hours) at 02/06/2024 4098 Last data filed at 02/06/2024 0900 Gross per 24 hour  Intake 4065.3 ml  Output 6480 ml  Net -2414.7 ml    Filed Weights   02/04/24 0600 02/05/24 0621 02/06/24 0356  Weight: 134.8 kg 130 kg 129.8 kg    Examination: No distress Rhonci BL, triggers vent; driving pressure 16 Improving edema Abd protuberant and hypoactive BS Moves to command  Cr improved K being repleted Shock liver stable Pct 3.13 BAL pending  Resolved Hospital Problem list   N/A  Assessment & Plan:  Concurrent inferior STEMI and ascending aortic dissection requiring RCA stent followed by emergent aortic reconstruction and aortic root replacement 4/16.  Involvement of renal artery noted. Postoperative ventilator management- resolved Postop ileus- ongoing Postoperative acute kidney injury- improved with diuresis Postoperative encephalopathy- resulted in inability to clear secretions and reintubation 02/05/24.  Query HCAP, abx started. Postoperative ABLA, coagulopathy- improved Postoperative vasoplegic and hemorrhagic shock- improved Hx HTN, obesity Postoperative hyperglycemia  - Sorbitol , reglan , suppositories - Diuretic break today - Rectal aspirin ; plavix  when we assure gut works - Chest drains likely out today - Continue broad spectrum abx, f/u tracheal aspirate - Insulin  PRN as ordered - Vent bundle, want gut to work and more brisk alertness before next extubation trial  Best Practice (right click and "Reselect all SmartList Selections" daily)   Diet/type: okay to challenge with trickles today DVT prophylaxis per TCTS Pressure ulcer(s): N/A GI prophylaxis: PPI Lines: Central line Foley:  Yes, and it is still needed Code Status:  full code Last date of multidisciplinary goals of care discussion [updated at bedside]  31 min cc time Ardelle Kos

## 2024-02-06 NOTE — Progress Notes (Signed)
 eLink Physician-Brief Progress Note [name] DOB: 10/03/1956 MRN: [id number]   Date of Service  02/06/2024  HPI/Events of Note  68 year old that underwent RCA stent, aortic arch placement, intermittently extubated and reintubated for aspiration. Mechanically ventilated and high risk of self-harm  eICU Interventions  Renew restraints for patient safety     Intervention Category Minor Interventions: Agitation / anxiety - evaluation and management  Vanessa Kampf 02/06/2024, 11:26 PM

## 2024-02-06 NOTE — Progress Notes (Signed)
 PHARMACY ANTIBIOTIC CONSULT NOTE   Dustin Davenport a 68 y.o. male admitted on 4/15 s/p type A dissection and Bentall repair and PCI/DES to RCA.  Pharmacy has been consulted for Vancomycin  and Merrem  dosing.  Scr improved a little from 3.22 to 2.98 today. PCT 3.13. WBC downtrending to 16.6. Tmax 100/24 hr. Vancomycin  random is therapeutic at 17 (last dose on 4/19@1727 ).   Estimated Creatinine Clearance: 33.5 mL/min (A) (by C-G formula based on SCr of 2.98 mg/dL (H)).  Plan: Continue meropenem  1g IV Q12H Will order vancomycin  1g IV once tonight then variable dosing based on levels until Scr improves further  Monitor renal function, clinical status, de-escalation, C/S, levels as indicated   Allergies:  Allergies  Allergen Reactions   Bee Venom     Filed Weights   02/04/24 0600 02/05/24 0621 02/06/24 0356  Weight: 134.8 kg (297 lb 2.9 oz) 130 kg (286 lb 9.6 oz) 129.8 kg (286 lb 2.5 oz)       Latest Ref Rng & Units 02/06/2024    3:35 AM 02/05/2024    4:42 PM 02/05/2024    5:11 AM  CBC  WBC 4.0 - 10.5 K/uL 16.6   18.7   Hemoglobin 13.0 - 17.0 g/dL 7.9  7.8  8.3   Hematocrit 39.0 - 52.0 % 23.8  23.0  25.0   Platelets 150 - 400 K/uL 101   86     Antibiotics Given (last 72 hours)     Date/Time Action Medication Dose Rate   02/03/24 2208 New Bag/Given   ceFAZolin  (ANCEF ) IVPB 2g/100 mL premix 2 g 200 mL/hr   02/05/24 1649 New Bag/Given   meropenem  (MERREM ) 1 g in sodium chloride  0.9 % 100 mL IVPB 1 g 200 mL/hr   02/05/24 1727 New Bag/Given   vancomycin  (VANCOREADY) IVPB 2000 mg/400 mL 2,000 mg 200 mL/hr   02/06/24 0359 New Bag/Given   meropenem  (MERREM ) 1 g in sodium chloride  0.9 % 100 mL IVPB 1 g 200 mL/hr   02/06/24 1744 New Bag/Given   meropenem  (MERREM ) 1 g in sodium chloride  0.9 % 100 mL IVPB 1 g 200 mL/hr       Antimicrobials this admission: Ancef  periop 4/15 - 4/17 Vanc 4/19 > c Merrem  4/19 > c  Microbiology results: 4/19 BAL: pending   4/15 MRSA PCR: negative, MSSA  positive  Thank you for allowing pharmacy to participate in this patient's care,  Nieves Bars, PharmD, BCCCP Clinical Pharmacist  Phone: [phone] 02/06/2024 7:07 PM  Please check AMION for all [facility] phone numbers After 10:00 PM, call Main Pharmacy [phone]

## 2024-02-07 ENCOUNTER — Other Ambulatory Visit: Payer: Self-pay

## 2024-02-07 ENCOUNTER — Inpatient Hospital Stay (HOSPITAL_COMMUNITY)

## 2024-02-07 DIAGNOSIS — I5021 Acute systolic (congestive) heart failure: Secondary | ICD-10-CM

## 2024-02-07 DIAGNOSIS — J189 Pneumonia, unspecified organism: Secondary | ICD-10-CM

## 2024-02-07 DIAGNOSIS — E876 Hypokalemia: Secondary | ICD-10-CM

## 2024-02-07 DIAGNOSIS — I2111 ST elevation (STEMI) myocardial infarction involving right coronary artery: Secondary | ICD-10-CM | POA: Diagnosis not present

## 2024-02-07 DIAGNOSIS — I7101 Dissection of ascending aorta: Secondary | ICD-10-CM | POA: Diagnosis not present

## 2024-02-07 DIAGNOSIS — R570 Cardiogenic shock: Secondary | ICD-10-CM | POA: Diagnosis not present

## 2024-02-07 DIAGNOSIS — J9602 Acute respiratory failure with hypercapnia: Secondary | ICD-10-CM | POA: Diagnosis not present

## 2024-02-07 DIAGNOSIS — J9601 Acute respiratory failure with hypoxia: Secondary | ICD-10-CM

## 2024-02-07 DIAGNOSIS — K7200 Acute and subacute hepatic failure without coma: Secondary | ICD-10-CM

## 2024-02-07 DIAGNOSIS — N179 Acute kidney failure, unspecified: Secondary | ICD-10-CM

## 2024-02-07 LAB — POCT I-STAT 7, (LYTES, BLD GAS, ICA,H+H)
Acid-Base Excess: 5 mmol/L — ABNORMAL HIGH (ref 0.0–2.0)
Acid-Base Excess: 0 mmol/L (ref 0.0–2.0)
Acid-Base Excess: 2 mmol/L (ref 0.0–2.0)
Bicarbonate: 28.3 mmol/L — ABNORMAL HIGH (ref 20.0–28.0)
Bicarbonate: 23.4 mmol/L (ref 20.0–28.0)
Bicarbonate: 26.7 mmol/L (ref 20.0–28.0)
Calcium, Ion: 1.14 mmol/L — ABNORMAL LOW (ref 1.15–1.40)
Calcium, Ion: 1.08 mmol/L — ABNORMAL LOW (ref 1.15–1.40)
Calcium, Ion: 1.2 mmol/L (ref 1.15–1.40)
HCT: 23 % — ABNORMAL LOW (ref 39.0–52.0)
HCT: 23 % — ABNORMAL LOW (ref 39.0–52.0)
HCT: 24 % — ABNORMAL LOW (ref 39.0–52.0)
Hemoglobin: 7.8 g/dL — ABNORMAL LOW (ref 13.0–17.0)
Hemoglobin: 7.8 g/dL — ABNORMAL LOW (ref 13.0–17.0)
Hemoglobin: 8.2 g/dL — ABNORMAL LOW (ref 13.0–17.0)
O2 Saturation: 96 %
O2 Saturation: 96 %
O2 Saturation: 94 %
Patient temperature: 36.3
Patient temperature: 36.2
Potassium: 3 mmol/L — ABNORMAL LOW (ref 3.5–5.1)
Potassium: 3.1 mmol/L — ABNORMAL LOW (ref 3.5–5.1)
Potassium: 3.7 mmol/L (ref 3.5–5.1)
Sodium: 144 mmol/L (ref 135–145)
Sodium: 148 mmol/L — ABNORMAL HIGH (ref 135–145)
Sodium: 146 mmol/L — ABNORMAL HIGH (ref 135–145)
TCO2: 29 mmol/L (ref 22–32)
TCO2: 24 mmol/L (ref 22–32)
TCO2: 28 mmol/L (ref 22–32)
pCO2 arterial: 36 mmHg (ref 32–48)
pCO2 arterial: 30.5 mmHg — ABNORMAL LOW (ref 32–48)
pCO2 arterial: 38.6 mmHg (ref 32–48)
pH, Arterial: 7.503 — ABNORMAL HIGH (ref 7.35–7.45)
pH, Arterial: 7.49 — ABNORMAL HIGH (ref 7.35–7.45)
pH, Arterial: 7.445 (ref 7.35–7.45)
pO2, Arterial: 77 mmHg — ABNORMAL LOW (ref 83–108)
pO2, Arterial: 72 mmHg — ABNORMAL LOW (ref 83–108)
pO2, Arterial: 67 mmHg — ABNORMAL LOW (ref 83–108)

## 2024-02-07 LAB — CBC
HCT: 24.5 % — ABNORMAL LOW (ref 39.0–52.0)
Hemoglobin: 8 g/dL — ABNORMAL LOW (ref 13.0–17.0)
MCH: 29 pg (ref 26.0–34.0)
MCHC: 32.7 g/dL (ref 30.0–36.0)
MCV: 88.8 fL (ref 80.0–100.0)
Platelets: 141 10*3/uL — ABNORMAL LOW (ref 150–400)
RBC: 2.76 MIL/uL — ABNORMAL LOW (ref 4.22–5.81)
RDW: 18.1 % — ABNORMAL HIGH (ref 11.5–15.5)
WBC: 23 10*3/uL — ABNORMAL HIGH (ref 4.0–10.5)
nRBC: 5.6 % — ABNORMAL HIGH (ref 0.0–0.2)

## 2024-02-07 LAB — COMPREHENSIVE METABOLIC PANEL WITH GFR
ALT: 86 U/L — ABNORMAL HIGH (ref 0–44)
AST: 255 U/L — ABNORMAL HIGH (ref 15–41)
Albumin: 2.6 g/dL — ABNORMAL LOW (ref 3.5–5.0)
Alkaline Phosphatase: 89 U/L (ref 38–126)
Anion gap: 15 (ref 5–15)
BUN: 58 mg/dL — ABNORMAL HIGH (ref 8–23)
CO2: 27 mmol/L (ref 22–32)
Calcium: 8.5 mg/dL — ABNORMAL LOW (ref 8.9–10.3)
Chloride: 103 mmol/L (ref 98–111)
Creatinine, Ser: 2.27 mg/dL — ABNORMAL HIGH (ref 0.61–1.24)
GFR, Estimated: 31 mL/min — ABNORMAL LOW (ref >60)
Glucose, Bld: 106 mg/dL — ABNORMAL HIGH (ref 70–99)
Potassium: 3.1 mmol/L — ABNORMAL LOW (ref 3.5–5.1)
Sodium: 145 mmol/L (ref 135–145)
Total Bilirubin: 1.8 mg/dL — ABNORMAL HIGH (ref 0.0–1.2)
Total Protein: 4.8 g/dL — ABNORMAL LOW (ref 6.5–8.1)

## 2024-02-07 LAB — COOXEMETRY PANEL
Carboxyhemoglobin: 1.5 % (ref 0.5–1.5)
Methemoglobin: <0.7 % (ref 0.0–1.5)
O2 Saturation: 74.3 %
Total hemoglobin: 8.1 g/dL — ABNORMAL LOW (ref 12.0–16.0)

## 2024-02-07 LAB — CULTURE, RESPIRATORY W GRAM STAIN

## 2024-02-07 LAB — BASIC METABOLIC PANEL WITH GFR
Anion gap: 13 (ref 5–15)
BUN: 55 mg/dL — ABNORMAL HIGH (ref 8–23)
CO2: 26 mmol/L (ref 22–32)
Calcium: 8.1 mg/dL — ABNORMAL LOW (ref 8.9–10.3)
Chloride: 107 mmol/L (ref 98–111)
Creatinine, Ser: 1.93 mg/dL — ABNORMAL HIGH (ref 0.61–1.24)
GFR, Estimated: 37 mL/min — ABNORMAL LOW (ref >60)
Glucose, Bld: 122 mg/dL — ABNORMAL HIGH (ref 70–99)
Potassium: 4.1 mmol/L (ref 3.5–5.1)
Sodium: 146 mmol/L — ABNORMAL HIGH (ref 135–145)

## 2024-02-07 LAB — GLUCOSE, CAPILLARY
Glucose-Capillary: 93 mg/dL (ref 70–99)
Glucose-Capillary: 106 mg/dL — ABNORMAL HIGH (ref 70–99)
Glucose-Capillary: 94 mg/dL (ref 70–99)
Glucose-Capillary: 106 mg/dL — ABNORMAL HIGH (ref 70–99)
Glucose-Capillary: 141 mg/dL — ABNORMAL HIGH (ref 70–99)
Glucose-Capillary: 101 mg/dL — ABNORMAL HIGH (ref 70–99)
Glucose-Capillary: 142 mg/dL — ABNORMAL HIGH (ref 70–99)
Glucose-Capillary: 160 mg/dL — ABNORMAL HIGH (ref 70–99)

## 2024-02-07 LAB — MAGNESIUM: Magnesium: 2 mg/dL (ref 1.7–2.4)

## 2024-02-07 LAB — PHOSPHORUS: Phosphorus: 3.3 mg/dL (ref 2.5–4.6)

## 2024-02-07 MED ORDER — CLOPIDOGREL BISULFATE 300 MG PO TABS
300.0000 mg | ORAL_TABLET | Freq: Once | ORAL | Status: AC
  Administered 2024-02-07: 300 mg
  Filled 2024-02-07: qty 1

## 2024-02-07 MED ORDER — POTASSIUM CHLORIDE 20 MEQ PO PACK
40.0000 meq | PACK | ORAL | Status: AC
  Administered 2024-02-07 (×2): 40 meq
  Filled 2024-02-07 (×2): qty 2

## 2024-02-07 MED ORDER — ASPIRIN 81 MG PO CHEW
81.0000 mg | CHEWABLE_TABLET | Freq: Every day | ORAL | Status: DC
  Administered 2024-02-07 – 2024-02-10 (×4): 81 mg
  Filled 2024-02-07 (×5): qty 1

## 2024-02-07 MED ORDER — POTASSIUM CHLORIDE 10 MEQ/50ML IV SOLN
10.0000 meq | INTRAVENOUS | Status: AC
  Administered 2024-02-07 (×3): 10 meq via INTRAVENOUS
  Filled 2024-02-07 (×3): qty 50

## 2024-02-07 MED ORDER — VITAL 1.5 CAL PO LIQD
1000.0000 mL | ORAL | Status: DC
  Administered 2024-02-07 – 2024-02-14 (×8): 1000 mL
  Filled 2024-02-07 (×3): qty 1000

## 2024-02-07 MED ORDER — SMOG ENEMA
960.0000 mL | Freq: Once | RECTAL | Status: AC
  Administered 2024-02-07: 960 mL via RECTAL
  Filled 2024-02-07: qty 960

## 2024-02-07 MED ORDER — PROSOURCE TF20 ENFIT COMPATIBL EN LIQD
60.0000 mL | Freq: Four times a day (QID) | ENTERAL | Status: DC
  Administered 2024-02-07 – 2024-02-08 (×5): 60 mL
  Filled 2024-02-07 (×5): qty 60

## 2024-02-07 MED ORDER — PIPERACILLIN-TAZOBACTAM 3.375 G IVPB
3.3750 g | Freq: Three times a day (TID) | INTRAVENOUS | Status: DC
  Administered 2024-02-07 – 2024-02-09 (×7): 3.375 g via INTRAVENOUS
  Filled 2024-02-07 (×7): qty 50

## 2024-02-07 MED ORDER — CLOPIDOGREL BISULFATE 75 MG PO TABS
75.0000 mg | ORAL_TABLET | Freq: Every day | ORAL | Status: DC
  Administered 2024-02-08 – 2024-02-10 (×3): 75 mg
  Filled 2024-02-07 (×4): qty 1

## 2024-02-07 MED ORDER — POTASSIUM CHLORIDE 10 MEQ/50ML IV SOLN: 10.0000 meq | INTRAVENOUS | Status: DC

## 2024-02-07 MED ORDER — EPINEPHRINE HCL 5 MG/250ML IV SOLN IN NS
0.5000 ug/min | INTRAVENOUS | Status: DC
  Administered 2024-02-08: 2.5 ug/min via INTRAVENOUS
  Administered 2024-02-08: 6 ug/min via INTRAVENOUS
  Administered 2024-02-08: 0.5 ug/min via INTRAVENOUS
  Filled 2024-02-07 (×3): qty 250

## 2024-02-07 MED ORDER — ATORVASTATIN CALCIUM 80 MG PO TABS
80.0000 mg | ORAL_TABLET | Freq: Every day | ORAL | Status: DC
Start: 2024-02-07 — End: 2024-02-11
  Administered 2024-02-07 – 2024-02-10 (×4): 80 mg
  Filled 2024-02-07 (×5): qty 1

## 2024-02-07 MED FILL — Sodium Chloride IV Soln 0.9%: INTRAVENOUS | Qty: 8000 | Status: AC

## 2024-02-07 MED FILL — Heparin Sodium (Porcine) Inj 1000 Unit/ML: INTRAMUSCULAR | Qty: 20 | Status: AC

## 2024-02-07 MED FILL — Mannitol IV Soln 20%: INTRAVENOUS | Qty: 500 | Status: AC

## 2024-02-07 MED FILL — Potassium Chloride Inj 2 mEq/ML: INTRAVENOUS | Qty: 40 | Status: AC

## 2024-02-07 MED FILL — Calcium Chloride Inj 10%: INTRAVENOUS | Qty: 10 | Status: AC

## 2024-02-07 MED FILL — Sodium Bicarbonate IV Soln 8.4%: INTRAVENOUS | Qty: 300 | Status: AC

## 2024-02-07 MED FILL — Albumin, Human Inj 5%: INTRAVENOUS | Qty: 500 | Status: AC

## 2024-02-07 MED FILL — Electrolyte-R (PH 7.4) Solution: INTRAVENOUS | Qty: 6000 | Status: AC

## 2024-02-07 MED FILL — Lidocaine HCl Local Preservative Free (PF) Inj 2%: INTRAMUSCULAR | Qty: 14 | Status: AC

## 2024-02-07 NOTE — Progress Notes (Signed)
 6 Days Post-Op Procedure(s) (LRB): REPAIR OF ASCENDING AORTIC ANEURYSM USING A 32X12X10X10X10MM HEMASHIELD PLATINUM GRAFT. (N/A) ECHOCARDIOGRAM, TRANSESOPHAGEAL, INTRAOPERATIVE (N/A) BENTALL PROCEDURE USING A KONECT RESILIA AORTIC VALVE CONDUIT (N/A) Subjective:  Hemodynamics stable overnight.  Co-ox 74.3, CI 2.4, PA 30/10, CVP 4 on epi 1.   Maintaining NSR 65 on amio 30/hr.   + 209 cc yesterday. Wt down 7 lbs if accurate, 14 lbs over preop.  Remains sedated on vent, fentanyl  200/hr, Dex 1.2.  Objective: Vital signs in last 24 hours: Temp:  [97.2 F (36.2 C)-100 F (37.8 C)] 97.3 F (36.3 C) (04/21 0700) Pulse Rate:  [64-74] 66 (04/21 0700) Cardiac Rhythm: Normal sinus rhythm (04/21 0000) Resp:  [0-22] 0 (04/21 0700) SpO2:  [94 %-100 %] 100 % (04/21 0700) Arterial Line BP: (56-170)/(33-70) 115/53 (04/21 0700) FiO2 (%):  [50 %-60 %] 60 % (04/21 0630) Weight:  [126.6 kg] 126.6 kg (04/21 0352)  Hemodynamic parameters for last 24 hours: PAP: (29-47)/(10-22) 31/10 CVP:  [4 mmHg-12 mmHg] 4 mmHg PCWP:  [8 mmHg] 8 mmHg CO:  [4.7 L/min-6.8 L/min] 5.9 L/min CI:  [1.94 L/min/m2-2.8 L/min/m2] 2.4 L/min/m2  Intake/Output from previous day: 04/20 0701 - 04/21 0700 In: 2809 [I.V.:1906.8; NG/GT:40; IV Piggyback:862.3] Out: 2600 [Urine:2540; Chest Tube:60] Intake/Output this shift: No intake/output data recorded.  General appearance: sedated on vent. Neurologic: moving all ext. +/- following commands per nurse Heart: regular rate and rhythm, S1, S2 normal, no murmur Lungs: clear to auscultation bilaterally Abdomen: soft, non-tender; bowel sounds normal, no BM yet Extremities: extremities warm, moderate edema Wound: incisions healing well  Lab Results: Recent Labs    02/06/24 0335 02/07/24 0143 02/07/24 0241  WBC 16.6*  --  23.0*  HGB 7.9* 7.8* 8.0*  HCT 23.8* 23.0* 24.5*  PLT 101*  --  141*   BMET:  Recent Labs    02/06/24 0335 02/07/24 0143 02/07/24 0241  NA  143  143 144 145  K 2.9*  2.9* 3.0* 3.1*  CL 98  98  --  103  CO2 29  29  --  27  GLUCOSE 102*  102*  --  106*  BUN 57*  57*  --  58*  CREATININE 2.86*  2.98*  --  2.27*  CALCIUM  8.4*  8.4*  --  8.5*    PT/INR: No results for input(s): "LABPROT", "INR" in the last 72 hours. ABG    Component Value Date/Time   PHART 7.503 (H) 02/07/2024 0143   HCO3 28.3 (H) 02/07/2024 0143   TCO2 29 02/07/2024 0143   ACIDBASEDEF 4.0 (H) 02/02/2024 0859   O2SAT 74.3 02/07/2024 0531   CBG (last 3)  Recent Labs    02/06/24 2047 02/06/24 2336 02/07/24 0430  GLUCAP 117* 93 106*   CXR: mild left base atelectasis, may be small pleural effusion.  Assessment/Plan: S/P Procedure(s) (LRB): REPAIR OF ASCENDING AORTIC ANEURYSM USING A 32X12X10X10X10MM HEMASHIELD PLATINUM GRAFT. (N/A) ECHOCARDIOGRAM, TRANSESOPHAGEAL, INTRAOPERATIVE (N/A) BENTALL PROCEDURE USING A KONECT RESILIA AORTIC VALVE CONDUIT (N/A)  POD 6 Hemodynamics fairly stable on low dose epi with good Co-ox and CI, maintaining sinus rhythm on amio.  VDRF: Reintubated for hypoxemic respiratory failure. Bronch with copious secretions that he was not clearing. Culture of BAL growing few Gram variable rods and rare yeast. CCM following.  AKI: creat continues to improve.  Meropenem  and vanc for possible pneumonia with leukocytosis and elevated procalcitonin.  He has good BS so can start tube feeds when ok with CCM.  Ok with me  to start Plavix  with ASA for STEMI and PCI with DES. Can give both down his tube.   LOS: 6 days    Bartley Lightning 02/07/2024

## 2024-02-07 NOTE — Progress Notes (Signed)
 Advanced Heart Failure Rounding Note  Cardiologist: None  Chief Complaint: STEMI> Cardiogenic shock> Now s/p aortic dissection repair + Bentall  Significant events:    - Echo 4/15: AAA measuring 5-5.7 cm. Followed by emergent TEE 4/15 which was significant for torrential aortic regurgitation and dissection flap extending from the aortic root to the proximal ascending aorta  - Emergent TEE 4/15: significant for torrential aortic regurgitation and dissection flap extending from the aortic root to the proximal ascending aorta  - CT C/A/P: Showed acute type A dissection with a 5.5 cm ascending aortic aneurysm extending into arch. Dissection goes down to infrarenal aorta and extends up into innominate and left common carotid arterie  - Taken to OR 4/15: Now s/p aortic dissection repair 02/02/24 (Bentall/AVR and reimplantation of coronaries. Intra-op course c/b coagulopathy due to Effient  (PCI of RCA earlier in the day)).  - Extubated 4/18 - Echo EF 20-25% RV severely down. AVR ok - Re-intubated 4/19. Bronch with diffuse mucopurulent secretions  Subjective:    Deescalated antibioitics to zosyn  given no risk factors for ESBL, continue vanc for now.  Epi and vaso off this morning. CVP 8, PA 41/19, CO 5.4/2.22, SVR 903.  Otherwise stable. Hypotensive with SBT this morning.      Objective:    Weight Range: 126.6 kg Body mass index is 36.82 kg/m.   Vital Signs:   Temp:  [97.2 F (36.2 C)-100 F (37.8 C)] 97.7 F (36.5 C) (04/21 1037) Pulse Rate:  [63-74] 73 (04/21 1037) Resp:  [0-22] 18 (04/21 1037) SpO2:  [94 %-100 %] 100 % (04/21 1037) Arterial Line BP: (56-170)/(33-70) 106/49 (04/21 0900) FiO2 (%):  [50 %-60 %] 50 % (04/21 1037) Weight:  [126.6 kg] 126.6 kg (04/21 0352) Last BM Date : 02/01/24  Weight change: Filed Weights   02/05/24 0621 02/06/24 0356 02/07/24 0352  Weight: 130 kg 129.8 kg 126.6 kg   Intake/Output:  Intake/Output Summary (Last 24 hours) at 02/07/2024  1111 Last data filed at 02/07/2024 0820 Gross per 24 hour  Intake 2450.21 ml  Output 2305 ml  Net 145.21 ml    Physical Exam   General: Intubated HEENT: + ETT Neck: supple.  RIJ swan Cor: RRR, systolic murmur Lungs: coarse, ventilated breath sounds Abdomen: obese, soft, nondistended Extremities: no cyanosis, clubbing, rash,1+ edema Neuro: intubated/sedated  Telemetry   SR 70s personally reviewed  Labs    CBC Recent Labs    02/06/24 0335 02/07/24 0143 02/07/24 0241 02/07/24 0840  WBC 16.6*  --  23.0*  --   HGB 7.9*   < > 8.0* 7.8*  HCT 23.8*   < > 24.5* 23.0*  MCV 86.5  --  88.8  --   PLT 101*  --  141*  --    < > = values in this interval not displayed.   Basic Metabolic Panel Recent Labs    16/10/96 0335 02/07/24 0143 02/07/24 0241 02/07/24 0840  NA 143  143   < > 145 148*  K 2.9*  2.9*   < > 3.1* 3.1*  CL 98  98  --  103  --   CO2 29  29  --  27  --   GLUCOSE 102*  102*  --  106*  --   BUN 57*  57*  --  58*  --   CREATININE 2.86*  2.98*  --  2.27*  --   CALCIUM  8.4*  8.4*  --  8.5*  --   MG 2.0  --  2.0  --   PHOS 3.6  3.5  --  3.3  --    < > = values in this interval not displayed.   Liver Function Tests Recent Labs    02/06/24 0335 02/07/24 0241  AST 739* 255*  ALT 123* 86*  ALKPHOS 62 89  BILITOT 1.4* 1.8*  PROT 5.0* 4.8*  ALBUMIN  3.0*  3.0* 2.6*   D-Dimer No results for input(s): "DDIMER" in the last 72 hours.  Hemoglobin A1C No results for input(s): "HGBA1C" in the last 72 hours.  Medications:    Scheduled Medications:  aspirin   81 mg Per Tube Daily   atorvastatin   80 mg Per Tube Daily   bisacodyl   10 mg Rectal Q0600   Chlorhexidine  Gluconate Cloth  6 each Topical Daily   [START ON 02/08/2024] clopidogrel   75 mg Per Tube Daily   insulin  aspart  0-9 Units Subcutaneous Q4H   methocarbamol  (ROBAXIN ) injection  500 mg Intravenous Q8H   metoCLOPramide  (REGLAN ) injection  5 mg Intravenous Q8H   mouth rinse  15 mL Mouth  Rinse Q2H   pantoprazole  (PROTONIX ) IV  40 mg Intravenous QHS   potassium chloride   40 mEq Per Tube Q4H   SMOG  960 mL Rectal Once   vancomycin  variable dose per unstable renal function (pharmacist dosing)   Does not apply See admin instructions   Infusions:  amiodarone  30 mg/hr (02/07/24 0700)   dexmedetomidine  (PRECEDEX ) IV infusion 1.4 mcg/kg/hr (02/07/24 0939)   fentaNYL  infusion INTRAVENOUS 200 mcg/hr (02/07/24 0700)   meropenem  (MERREM ) IV Stopped (02/07/24 0448)   potassium chloride  10 mEq (02/07/24 1024)   PRN Medications: fentaNYL , midazolam , ondansetron  (ZOFRAN ) IV, mouth rinse  Patient Profile   Dustin Davenport is a 68 y.o. male with HTN and HLD. Admitted with STEMI now with CGS. Found to have AAA and taken emergently to the OR. Now s/p Bentall/AVR and reimplantation of coronaries.  Assessment/Plan   Acute type A aortic dissection  - Echo 4/15: AAA measuring 5-5.7 cm - Emergent TEE 4/15: significant for torrential aortic regurgitation and dissection flap extending from the aortic root to the proximal ascending aorta  - CT C/A/P: Showed acute type A dissection with a 5.5 cm ascending aortic aneurysm extending into arch. Dissection goes down to infrarenal aorta and extends up into innominate and left common carotid arteries  - Now s/p aortic dissection repair 02/01/24 (Bentall/AVR and reimplantation of coronaries. Intra-op course c/b coagulopathy due to Effient   2. CAD, Acute inferiro STEMI - Admitted with STEMI. Post PCI found to also have aortic dissection - LHC with single vessel occlusive CAD involving the mid to distal RCA, successful PCI of the RCA with overlapping DES x 3. - Continue aspirin  81mg  daily - Restart plavix  per Dr. Sherene Dilling - Restart atorvastatin  80mg  daily  3. Acute systolic HF ->  Cardiogenic shock - Post perfusion CGS with wide pulse pressure - Echo 4/19: EF 25% RV moderate to severely decreased - Co-ox 74%, off vasopressors   - Swan numbers stable, if  good this afternoon with pull swan - Repeat lasix  this afternoon  4. Post-op hypoxic resp failure - extubated 4/18.  - re-intubated 4/19 - diffuse mucopurulent secretions on bronch 4/19. BAL GS: Mod WBC. Few gram variable rods. Rare yeast - Now on vanc/zosyn . Follow cultures - Low threshold for fungal coverage if worsening shock, though does not have classic risk factors - CCM following   5. PAF with post-termination pauses PVCs - Patient with AF intra-op.  -  Now back in NSR. Continue IV amio   6. Hypokalemia - K 3.1. will supp   7. AKI due to ATN/shock - Baseline SCr ~1.3-1.4 - SCr peaked at 3.2, now 2.27 - Repeat lasix  this afternoon  8. ABLA post-op - Hgb 8 today - Received 6uPRBCs, 6 plt, 4 FFP and 5 cryo intra-op.   - Follow H&H. Keep hgb >8. May need transfusion tomorrow   CRITICAL CARE Performed by: [name]   Total critical care time: 35 minutes  Critical care time was exclusive of separately billable procedures and treating other patients.  Critical care was necessary to treat or prevent imminent or life-threatening deterioration.  Critical care was time spent personally by me on the following activities: development of treatment plan with patient and/or surrogate as well as nursing, discussions with consultants, evaluation of patient's response to treatment, examination of patient, obtaining history from patient or surrogate, ordering and performing treatments and interventions, ordering and review of laboratory studies, ordering and review of radiographic studies, pulse oximetry and re-evaluation of patient's condition.    Length of Stay: 6  [name], MD  02/07/2024, 11:11 AM  Advanced Heart Failure Team Pager [phone] (M-F; 7a - 5p)  Please contact [facility] for night-coverage after hours (5p -7a ) and weekends on [email or web address]

## 2024-02-07 NOTE — Progress Notes (Signed)
 EVENING ROUNDS NOTE :     [address]       [place] [id number]             [phone]                 6 Days Post-Op Procedure(s) (LRB): REPAIR OF ASCENDING AORTIC ANEURYSM USING A 32X12X10X10X10MM HEMASHIELD PLATINUM GRAFT. (N/A) ECHOCARDIOGRAM, TRANSESOPHAGEAL, INTRAOPERATIVE (N/A) BENTALL PROCEDURE USING A KONECT RESILIA AORTIC VALVE CONDUIT (N/A)   Total Length of Stay:  LOS: 6 days  Events:   No events Sedated on vent Low dose epi   BP 126/68   Pulse 68   Temp (!) 97 F (36.1 C)   Resp (!) 4   Ht 6\' 1"  (1.854 m)   Wt 126.6 kg   SpO2 100%   BMI 36.82 kg/m   PAP: (29-49)/(9-25) 37/13 CVP:  [4 mmHg-19 mmHg] 8 mmHg CO:  [5.9 L/min-6.8 L/min] 6.7 L/min CI:  [2.4 L/min/m2-2.8 L/min/m2] 2.7 L/min/m2  Vent Mode: PRVC FiO2 (%):  [50 %-60 %] 50 % Set Rate:  [20 bmp] 20 bmp Vt Set:  [560 mL-640 mL] 560 mL PEEP:  [8 cmH20-10 cmH20] 10 cmH20 Plateau Pressure:  [22 cmH20] 22 cmH20   amiodarone  30 mg/hr (02/07/24 1719)   dexmedetomidine  (PRECEDEX ) IV infusion 1.4 mcg/kg/hr (02/07/24 1719)   epinephrine  2 mcg/min (02/07/24 1719)   feeding supplement (VITAL 1.5 CAL)     fentaNYL  infusion INTRAVENOUS 200 mcg/hr (02/07/24 1719)   piperacillin -tazobactam (ZOSYN )  IV Stopped (02/07/24 1713)    I/O last 3 completed shifts: In: 4532.3 [I.V.:3067.9; NG/GT:40; IV Piggyback:1424.4] Out: 5680 [Urine:5520; Chest Tube:160]      Latest Ref Rng & Units 02/07/2024    8:40 AM 02/07/2024    2:41 AM 02/07/2024    1:43 AM  CBC  WBC 4.0 - 10.5 K/uL  23.0    Hemoglobin 13.0 - 17.0 g/dL 7.8  8.0  7.8   Hematocrit 39.0 - 52.0 % 23.0  24.5  23.0   Platelets 150 - 400 K/uL  141         Latest Ref Rng & Units 02/07/2024    1:21 PM 02/07/2024    8:40 AM 02/07/2024    2:41 AM  BMP  Glucose 70 - 99 mg/dL 295   621   BUN 8 - 23 mg/dL 55   58   Creatinine 3.08 - 1.24 mg/dL 6.57   8.46   Sodium 962 - 145 mmol/L 146  148  145   Potassium 3.5 - 5.1 mmol/L 4.1  3.1   3.1   Chloride 98 - 111 mmol/L 107   103   CO2 22 - 32 mmol/L 26   27   Calcium  8.9 - 10.3 mg/dL 8.1   8.5     ABG    Component Value Date/Time   PHART 7.490 (H) 02/07/2024 0840   PCO2ART 30.5 (L) 02/07/2024 0840   PO2ART 72 (L) 02/07/2024 0840   HCO3 23.4 02/07/2024 0840   TCO2 24 02/07/2024 0840   ACIDBASEDEF 4.0 (H) 02/02/2024 0859   O2SAT 96 02/07/2024 0840       Starleen Eastern, MD 02/07/2024 5:29 PM

## 2024-02-07 NOTE — Progress Notes (Signed)
 Dustin Davenport, MRN:  [id number], DOB:  1956/08/22, LOS: 6 ADMISSION DATE:  02/01/2024, CONSULTATION DATE:  02/03/24 REFERRING MD:  Sherene Dilling, CHIEF COMPLAINT:  chest pain   History of Present Illness:  68 year old man w/ hx of HTN, obesity who presented on 4/15 with sudden onset chest pain.  EKG showing inferior STEMI and went to cath lab for RCA DES x 3.  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  Taken emergently to OR for Bentall+ ascending/arch replacement.  Procedure complicated by coagulopathy related to antiplatelet therapy from recent stent, multiple liters of blood products given.  Returned to ICU 4/16 on vent and PCCM consulted to assist with vent wean on 4/17.  Currently heavily sedated; attempts at weaning resulted in agitation without ability to redirect.  Pertinent  Medical History   Past Medical History:  Diagnosis Date   Hypertension      Significant Hospital Events: Including procedures, antibiotic start and stop dates in addition to other pertinent events   4/16 RCA stent then Bentall, aortic arch replacement 4/18 extubated 4/19 reintubated in afternoon for aspiration, bronch  Interim History / Subjective:  Vaso was titrated off Remain on epinephrine  at 1-2 mics Made 2.5 L of urine, though net +200 cc Remained afebrile White count trended up  Objective   Blood pressure 126/68, pulse 64, temperature (!) 97.3 F (36.3 C), resp. rate (!) 9, height 6\' 1"  (1.854 m), weight 126.6 kg, SpO2 100%. PAP: (29-47)/(10-22) 31/10 CVP:  [4 mmHg-12 mmHg] 4 mmHg PCWP:  [8 mmHg] 8 mmHg CO:  [4.8 L/min-6.8 L/min] 5.9 L/min CI:  [1.97 L/min/m2-2.8 L/min/m2] 2.4 L/min/m2  Vent Mode: PRVC FiO2 (%):  [50 %-60 %] 60 % Set Rate:  [20 bmp] 20 bmp Vt Set:  [640 mL] 640 mL PEEP:  [8 cmH20] 8 cmH20 Plateau Pressure:  [22 cmH20-252 cmH20] 22 cmH20   Intake/Output Summary (Last 24 hours) at  02/07/2024 0819 Last data filed at 02/07/2024 0700 Gross per 24 hour  Intake 2665.2 ml  Output 2405 ml  Net 260.2 ml   Filed Weights   02/05/24 0621 02/06/24 0356 02/07/24 0352  Weight: 130 kg 129.8 kg 126.6 kg    Examination: General: Crtitically ill-appearing obese male, orally intubated HEENT: [place]/AT, eyes anicteric.  ETT and OGT in place Neuro: Eyes closed, does not open, intermittently opens eyes and gets agitated. Pupils 2 mm bilateral reactive to light Chest: Central sternotomy incision looks clean and dry, coarse breath sounds, no wheezes or rhonchi Heart: Regular rate and rhythm, no murmurs or gallops Abdomen: Soft, nondistended, bowel sounds present Skin: No rash   Cr improving K 3.1 LFTs trending down Pct 3.13 WBC 23 H/H 8/24.5 BAL: Gram variable rods Coox 74%  I/O net +200 cc in last 24 hours, chest tube output 60 cc in last 24 hours Echo LVEF 35% with RV dilatation and reduced function  Resolved Hospital Problem list     Assessment & Plan:  Acute inferior wall STEMI status post PCI and DES 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 Acute respiratory failure with hypoxia and hypercapnia Bilateral multifocal pneumonia Shock combined cardiogenic/septic Acute kidney injury due to ischemic ATN from shock Hypokalemia Shock liver, improving Acute encephalopathy, likely septic Perioperative acute blood loss anemia and coagulopathy Obesity Constipation Continue aspirin  and Plavix  Restarted statin as LFTs are improving Monitor chest tube output TCTS following  Continue lung protective ventilation Repeat ABGs pending, currently on FiO2 of 60% and PEEP of 8, titrate with O2 sat goal 92% and PaO2 goal of 55-80% VAP prevention bundle in place PAD protocol with Precedex  and fentanyl  Respiratory culture is growing gram variable rods Continue broad-spectrum antibiotics with vancomycin  and meropenem  for now Titrate  vasopressor support with MAP goal 65 Serum creatinine continued to improve, making good amount of urine Avoid nephrotoxic agent Continue aggressive electrolyte replacement LFTs continue to improve Avoid deep sedation, RASS goal -1--2 H&H is stable Diet and exercise counseling as appropriate Continue smog enema   Best Practice (right click and "Reselect all SmartList Selections" daily)   Diet/type: Tube feeds DVT prophylaxis per TCTS Pressure ulcer(s): N/A GI prophylaxis: PPI Lines: Central line Foley:  Yes, and it is still needed Code Status:  full code Last date of multidisciplinary goals of care discussion [updated at bedside]  The patient is critically ill due to acute respiratory failure with hypoxia and hypercapnia/septic shock. critical care was necessary to treat or prevent imminent or life-threatening deterioration.  Critical care was time spent personally by me on the following activities: development of treatment plan with patient and/or surrogate as well as nursing, discussions with consultants, evaluation of patient's response to treatment, examination of patient, obtaining history from patient or surrogate, ordering and performing treatments and interventions, ordering and review of laboratory studies, ordering and review of radiographic studies, pulse oximetry, re-evaluation of patient's condition and participation in multidisciplinary rounds.   During this encounter critical care time was devoted to patient care services described in this note for 36 minutes.     Trevor Fudge, MD [facility] See Amion for pager If no response to pager, please call [phone] until 7pm After 7pm, Please call E-link [phone]

## 2024-02-07 NOTE — Progress Notes (Signed)
 Nutrition Follow-up  DOCUMENTATION CODES:   Obesity unspecified  INTERVENTION:  *** Tube Feeding via OG:   NUTRITION DIAGNOSIS:   Increased nutrient needs related to acute illness as evidenced by estimated needs.  ***  GOAL:   Patient will meet greater than or equal to 90% of their needs  ***  MONITOR:   Vent status, Labs, I & O's, Diet advancement, TF tolerance  REASON FOR ASSESSMENT:   Ventilator    ASSESSMENT:   68 y.o Male with history of HTN, HLD, who presents with acute inferior STEMI.  ***   NUTRITION - FOCUSED PHYSICAL EXAM:  {RD Focused Exam List:21252}  Diet Order:   Diet Order             Diet NPO time specified  Diet effective now                   EDUCATION NEEDS:   Not appropriate for education at this time  Skin:  Skin Assessment: Skin Integrity Issues: Skin Integrity Issues:: Incisions Incisions: Chest incisions  Last BM:  02/01/24, type 5, medium  Height:   Ht Readings from Last 1 Encounters:  02/05/24 6\' 1"  (1.854 m)    Weight:   Wt Readings from Last 1 Encounters:  02/07/24 126.6 kg    Ideal Body Weight:  83.6 kg  BMI:  Body mass index is 36.82 kg/m.  Estimated Nutritional Needs:   Kcal:  2400-2600 kcal  Protein:  160-180 gm  Fluid:  >2L/day   Norvel Beer MS, RDN, LDN, CNSC Registered Dietitian 3 Clinical Nutrition RD Inpatient Contact Info in Amion

## 2024-02-07 NOTE — Progress Notes (Signed)
 Heart Failure Navigator Progress Note  Assessed for Heart & Vascular TOC clinic readiness.  Patient does not meet criteria due to Advanced Heart Failure Team patient of Dr. Gasper Lloyd.   Navigator will sign off at this time.   Rhae Hammock, BSN, Scientist, clinical (histocompatibility and immunogenetics) Only

## 2024-02-07 NOTE — Progress Notes (Signed)
 OT Cancellation Note  Patient Details Name: Dustin Davenport MRN: [id number] DOB: 1956/02/02   Cancelled Treatment:    Reason Eval/Treat Not Completed: Patient not medically ready (sedated/on vent/in restraints) Per RN not following commands at a level appropriate for therapy. [name] has been followed for several days by the acute therapy team. At this time, OT will sign off. Please re-order when appropriate and medically ready.    Ebony Goldstein Velton Roselle 02/07/2024, 8:24 AM  [place] OTR/L Acute Rehabilitation Services Office: [phone]

## 2024-02-07 NOTE — Progress Notes (Signed)
 PT Cancellation Note  Patient Details Name: Dustin Davenport MRN: [id number] DOB: May 27, 1956   Cancelled Treatment:    Reason Eval/Treat Not Completed: Patient not medically ready  Patient remains intubated and sedated. Per RN, still not appropriate for therapies. Patient has been followed for several days. At this time, will sign-off and please re-order when appropriate.    Gayle Kava, PT Acute Rehabilitation Services  Office [phone]  [place] Leep 02/07/2024, 8:22 AM

## 2024-02-07 NOTE — Plan of Care (Signed)
  Problem: Clinical Measurements: Goal: Diagnostic test results will improve Outcome: Progressing Goal: Cardiovascular complication will be avoided Outcome: Progressing   Problem: Elimination: Goal: Will not experience complications related to urinary retention Outcome: Progressing   Problem: Pain Managment: Goal: General experience of comfort will improve and/or be controlled Outcome: Progressing   Problem: Safety: Goal: Ability to remain free from injury will improve Outcome: Progressing

## 2024-02-07 NOTE — Progress Notes (Addendum)
 eLink Physician-Brief Progress Note [name] DOB: 13-Aug-1956 MRN: [id number]   Date of Service  02/07/2024  HPI/Events of Note  68 year old that underwent RCA stent, aortic arch placement, intermittently extubated and reintubated for aspiration. Mechanically ventilated and high risk of self-harm  eICU Interventions  Renew restraints for safety   0128 -patient is on low-dose of epinephrine -2.5 mcg.  Patient is hypertensive with more and hypotensive with less-change titration to 0.5-2 mcg with each interval  Intervention Category Minor Interventions: Agitation / anxiety - evaluation and management  Mantaj Chamberlin 02/07/2024, 10:51 PM

## 2024-02-08 ENCOUNTER — Inpatient Hospital Stay (HOSPITAL_COMMUNITY)

## 2024-02-08 DIAGNOSIS — R570 Cardiogenic shock: Secondary | ICD-10-CM | POA: Diagnosis not present

## 2024-02-08 DIAGNOSIS — I2111 ST elevation (STEMI) myocardial infarction involving right coronary artery: Secondary | ICD-10-CM | POA: Diagnosis not present

## 2024-02-08 DIAGNOSIS — J9601 Acute respiratory failure with hypoxia: Secondary | ICD-10-CM | POA: Diagnosis not present

## 2024-02-08 DIAGNOSIS — I5021 Acute systolic (congestive) heart failure: Secondary | ICD-10-CM | POA: Diagnosis not present

## 2024-02-08 DIAGNOSIS — I7101 Dissection of ascending aorta: Secondary | ICD-10-CM | POA: Diagnosis not present

## 2024-02-08 DIAGNOSIS — J9602 Acute respiratory failure with hypercapnia: Secondary | ICD-10-CM | POA: Diagnosis not present

## 2024-02-08 LAB — HEPATIC FUNCTION PANEL
ALT: 74 U/L — ABNORMAL HIGH (ref 0–44)
AST: 162 U/L — ABNORMAL HIGH (ref 15–41)
Albumin: 2.2 g/dL — ABNORMAL LOW (ref 3.5–5.0)
Alkaline Phosphatase: 117 U/L (ref 38–126)
Bilirubin, Direct: 0.5 mg/dL — ABNORMAL HIGH (ref 0.0–0.2)
Indirect Bilirubin: 1.1 mg/dL — ABNORMAL HIGH (ref 0.3–0.9)
Total Bilirubin: 1.6 mg/dL — ABNORMAL HIGH (ref 0.0–1.2)
Total Protein: 4.7 g/dL — ABNORMAL LOW (ref 6.5–8.1)

## 2024-02-08 LAB — CBC
HCT: 24.5 % — ABNORMAL LOW (ref 39.0–52.0)
Hemoglobin: 7.7 g/dL — ABNORMAL LOW (ref 13.0–17.0)
MCH: 28.3 pg (ref 26.0–34.0)
MCHC: 31.4 g/dL (ref 30.0–36.0)
MCV: 90.1 fL (ref 80.0–100.0)
Platelets: 207 10*3/uL (ref 150–400)
RBC: 2.72 MIL/uL — ABNORMAL LOW (ref 4.22–5.81)
RDW: 18.6 % — ABNORMAL HIGH (ref 11.5–15.5)
WBC: 21.2 10*3/uL — ABNORMAL HIGH (ref 4.0–10.5)
nRBC: 9.8 % — ABNORMAL HIGH (ref 0.0–0.2)

## 2024-02-08 LAB — COOXEMETRY PANEL
Carboxyhemoglobin: 1.7 % — ABNORMAL HIGH (ref 0.5–1.5)
Methemoglobin: <0.7 % (ref 0.0–1.5)
O2 Saturation: 65.8 %
Total hemoglobin: 7.8 g/dL — ABNORMAL LOW (ref 12.0–16.0)

## 2024-02-08 LAB — GLUCOSE, CAPILLARY
Glucose-Capillary: 157 mg/dL — ABNORMAL HIGH (ref 70–99)
Glucose-Capillary: 141 mg/dL — ABNORMAL HIGH (ref 70–99)
Glucose-Capillary: 127 mg/dL — ABNORMAL HIGH (ref 70–99)
Glucose-Capillary: 171 mg/dL — ABNORMAL HIGH (ref 70–99)

## 2024-02-08 LAB — POCT I-STAT 7, (LYTES, BLD GAS, ICA,H+H)
Acid-Base Excess: 3 mmol/L — ABNORMAL HIGH (ref 0.0–2.0)
Bicarbonate: 27.9 mmol/L (ref 20.0–28.0)
Calcium, Ion: 1.18 mmol/L (ref 1.15–1.40)
HCT: 24 % — ABNORMAL LOW (ref 39.0–52.0)
Hemoglobin: 8.2 g/dL — ABNORMAL LOW (ref 13.0–17.0)
O2 Saturation: 93 %
Patient temperature: 37.1
Potassium: 3.1 mmol/L — ABNORMAL LOW (ref 3.5–5.1)
Sodium: 149 mmol/L — ABNORMAL HIGH (ref 135–145)
TCO2: 29 mmol/L (ref 22–32)
pCO2 arterial: 41.5 mmHg (ref 32–48)
pH, Arterial: 7.436 (ref 7.35–7.45)
pO2, Arterial: 66 mmHg — ABNORMAL LOW (ref 83–108)

## 2024-02-08 LAB — RENAL FUNCTION PANEL
Albumin: 2.2 g/dL — ABNORMAL LOW (ref 3.5–5.0)
Anion gap: 8 (ref 5–15)
BUN: 58 mg/dL — ABNORMAL HIGH (ref 8–23)
CO2: 27 mmol/L (ref 22–32)
Calcium: 8 mg/dL — ABNORMAL LOW (ref 8.9–10.3)
Chloride: 112 mmol/L — ABNORMAL HIGH (ref 98–111)
Creatinine, Ser: 1.64 mg/dL — ABNORMAL HIGH (ref 0.61–1.24)
GFR, Estimated: 45 mL/min — ABNORMAL LOW (ref >60)
Glucose, Bld: 162 mg/dL — ABNORMAL HIGH (ref 70–99)
Phosphorus: 2.7 mg/dL (ref 2.5–4.6)
Potassium: 3.1 mmol/L — ABNORMAL LOW (ref 3.5–5.1)
Sodium: 147 mmol/L — ABNORMAL HIGH (ref 135–145)

## 2024-02-08 LAB — VANCOMYCIN, RANDOM: Vancomycin Rm: 11 ug/mL

## 2024-02-08 LAB — MAGNESIUM: Magnesium: 2.1 mg/dL (ref 1.7–2.4)

## 2024-02-08 LAB — PHOSPHORUS: Phosphorus: 2.6 mg/dL (ref 2.5–4.6)

## 2024-02-08 MED ORDER — LACTULOSE 10 GM/15ML PO SOLN
20.0000 g | ORAL | Status: DC
  Administered 2024-02-08 (×3): 20 g
  Filled 2024-02-08 (×4): qty 30

## 2024-02-08 MED ORDER — SENNOSIDES-DOCUSATE SODIUM 8.6-50 MG PO TABS
1.0000 | ORAL_TABLET | Freq: Two times a day (BID) | ORAL | Status: DC
  Administered 2024-02-08: 1
  Filled 2024-02-08 (×2): qty 1

## 2024-02-08 MED ORDER — FUROSEMIDE 10 MG/ML IJ SOLN
80.0000 mg | Freq: Once | INTRAMUSCULAR | Status: AC
  Administered 2024-02-08: 80 mg via INTRAVENOUS
  Filled 2024-02-08: qty 8

## 2024-02-08 MED ORDER — ACETAMINOPHEN 160 MG/5ML PO SOLN
650.0000 mg | Freq: Four times a day (QID) | ORAL | Status: DC | PRN
Start: 2024-02-08 — End: 2024-02-09
  Administered 2024-02-08 – 2024-02-09 (×3): 650 mg
  Filled 2024-02-08 (×3): qty 20.3

## 2024-02-08 MED ORDER — POTASSIUM CHLORIDE 20 MEQ PO PACK
40.0000 meq | PACK | Freq: Once | ORAL | Status: AC
  Administered 2024-02-08: 40 meq
  Filled 2024-02-08: qty 2

## 2024-02-08 MED ORDER — FREE WATER
100.0000 mL | Freq: Four times a day (QID) | Status: DC
  Administered 2024-02-08 – 2024-02-09 (×4): 100 mL

## 2024-02-08 MED ORDER — VANCOMYCIN HCL 1750 MG/350ML IV SOLN
1750.0000 mg | INTRAVENOUS | Status: AC
  Administered 2024-02-08 – 2024-02-10 (×3): 1750 mg via INTRAVENOUS
  Filled 2024-02-08 (×4): qty 350

## 2024-02-08 MED ORDER — SODIUM CHLORIDE 0.9% FLUSH: 10.0000 mL | INTRAVENOUS | Status: DC | PRN

## 2024-02-08 MED ORDER — SODIUM CHLORIDE 0.9% FLUSH
10.0000 mL | Freq: Two times a day (BID) | INTRAVENOUS | Status: DC
Start: 2024-02-08 — End: 2024-02-28
  Administered 2024-02-08: 30 mL
  Administered 2024-02-09 – 2024-02-13 (×9): 10 mL
  Administered 2024-02-14: 20 mL
  Administered 2024-02-14 – 2024-02-15 (×3): 10 mL
  Administered 2024-02-16: 20 mL
  Administered 2024-02-16: 10 mL
  Administered 2024-02-17: 20 mL
  Administered 2024-02-17 – 2024-02-20 (×7): 10 mL
  Administered 2024-02-21: 30 mL
  Administered 2024-02-21 – 2024-02-22 (×2): 20 mL
  Administered 2024-02-22: 30 mL
  Administered 2024-02-23: 20 mL
  Administered 2024-02-24 – 2024-02-25 (×3): 10 mL
  Administered 2024-02-26: 20 mL
  Administered 2024-02-26 – 2024-02-27 (×2): 10 mL

## 2024-02-08 MED ORDER — LACTULOSE 10 GM/15ML PO SOLN: 20.0000 g | ORAL | Status: DC

## 2024-02-08 MED ORDER — PROSOURCE TF20 ENFIT COMPATIBL EN LIQD
60.0000 mL | Freq: Three times a day (TID) | ENTERAL | Status: DC
  Administered 2024-02-08 – 2024-02-14 (×16): 60 mL
  Filled 2024-02-08 (×18): qty 60

## 2024-02-08 MED ORDER — CLONAZEPAM 1 MG PO TABS
1.0000 mg | ORAL_TABLET | Freq: Two times a day (BID) | ORAL | Status: DC
  Administered 2024-02-08 – 2024-02-09 (×4): 1 mg
  Filled 2024-02-08 (×4): qty 1

## 2024-02-08 MED ORDER — FUROSEMIDE 10 MG/ML IJ SOLN: 60.0000 mg | Freq: Two times a day (BID) | INTRAMUSCULAR | Status: DC

## 2024-02-08 MED ORDER — POTASSIUM CHLORIDE 10 MEQ/50ML IV SOLN
10.0000 meq | INTRAVENOUS | Status: AC
  Administered 2024-02-08 (×3): 10 meq via INTRAVENOUS
  Filled 2024-02-08 (×3): qty 50

## 2024-02-08 NOTE — Progress Notes (Signed)
 Peripherally Inserted Central Catheter Placement  The IV Nurse has discussed with the patient and/or persons authorized to consent for the patient, the purpose of this procedure and the potential benefits and risks involved with this procedure.  The benefits include less needle sticks, lab draws from the catheter, and the patient may be discharged home with the catheter. Risks include, but not limited to, infection, bleeding, blood clot (thrombus formation), and puncture of an artery; nerve damage and irregular heartbeat and possibility to perform a PICC exchange if needed/ordered by physician.  Alternatives to this procedure were also discussed.  Bard Power PICC patient education guide, fact sheet on infection prevention and patient information card has been provided to patient /or left at bedside.   Wife signed consent. PICC Placement Documentation  PICC Triple Lumen 02/08/24 Right Brachial 46 cm 0 cm (Active)  Indication for Insertion or Continuance of Line Vasoactive infusions 02/08/24 1031  Exposed Catheter (cm) 0 cm 02/08/24 1031  Site Assessment Clean, Dry, Intact 02/08/24 1031  Lumen #1 Status Flushed;Blood return noted;Saline locked 02/08/24 1031  Lumen #2 Status Flushed;Blood return noted;Saline locked 02/08/24 1031  Lumen #3 Status Flushed;Blood return noted;Saline locked 02/08/24 1031  Dressing Type Transparent 02/08/24 1031  Dressing Status Antimicrobial disc/dressing in place 02/08/24 1031  Line Care Connections checked and tightened 02/08/24 1031  Line Adjustment (NICU/IV Team Only) No 02/08/24 1031  Dressing Intervention New dressing 02/08/24 1031  Dressing Change Due 02/15/24 02/08/24 1031       Curtis Dow Ramos 02/08/2024, 10:33 AM

## 2024-02-08 NOTE — Progress Notes (Signed)
 PHARMACY ANTIBIOTIC CONSULT NOTE   Dustin Davenport a 68 y.o. male admitted on 4/15 s/p type A dissection and Bentall repair and PCI/DES to RCA.  Pharmacy has been consulted for Vancomycin  and Zosyn  dosing  Scr improved from 2.27 >1.93 >1.64 today. WBC remains elevated at 21.2. Tmax 98.8 /24 hr. Vancomycin  random is subtherapeutic at 11 (last dose on 4/20 @1622 ).   Estimated Creatinine Clearance: 61.7 mL/min (A) (by C-G formula based on SCr of 1.64 mg/dL (H)).  Plan: Continue Zosyn  3.375 gm IV every 8 hours Will schedule vancomycin  dosing with improved renal function and stable urine output. Give Vancomycin  1750 mg every 24 hours. Predicted AUC 472, Predicted Css 10.5.   Monitor renal function, clinical status, de-escalation, C/S, levels as indicated   Allergies:  Allergies  Allergen Reactions   Bee Venom     Filed Weights   02/06/24 0356 02/07/24 0352 02/08/24 0500  Weight: 129.8 kg (286 lb 2.5 oz) 126.6 kg (279 lb 1.6 oz) 133.1 kg (293 lb 6.9 oz)       Latest Ref Rng & Units 02/08/2024    4:10 AM 02/08/2024   12:43 AM 02/07/2024    4:00 PM  CBC  WBC 4.0 - 10.5 K/uL  21.2    Hemoglobin 13.0 - 17.0 g/dL 8.2  7.7  8.2   Hematocrit 39.0 - 52.0 % 24.0  24.5  24.0   Platelets 150 - 400 K/uL  207      Antibiotics Given (last 72 hours)     Date/Time Action Medication Dose Rate   02/05/24 1649 New Bag/Given   meropenem  (MERREM ) 1 g in sodium chloride  0.9 % 100 mL IVPB 1 g 200 mL/hr   02/05/24 1727 New Bag/Given   vancomycin  (VANCOREADY) IVPB 2000 mg/400 mL 2,000 mg 200 mL/hr   02/06/24 0359 New Bag/Given   meropenem  (MERREM ) 1 g in sodium chloride  0.9 % 100 mL IVPB 1 g 200 mL/hr   02/06/24 1744 New Bag/Given   meropenem  (MERREM ) 1 g in sodium chloride  0.9 % 100 mL IVPB 1 g 200 mL/hr   02/06/24 2053 New Bag/Given   vancomycin  (VANCOCIN ) IVPB 1000 mg/200 mL premix 1,000 mg 200 mL/hr   02/07/24 0418 New Bag/Given   meropenem  (MERREM ) 1 g in sodium chloride  0.9 % 100 mL IVPB 1 g 200  mL/hr   02/07/24 1313 New Bag/Given   piperacillin -tazobactam (ZOSYN ) IVPB 3.375 g 3.375 g 12.5 mL/hr   02/07/24 2158 New Bag/Given   piperacillin -tazobactam (ZOSYN ) IVPB 3.375 g 3.375 g 12.5 mL/hr   02/08/24 5621 New Bag/Given   piperacillin -tazobactam (ZOSYN ) IVPB 3.375 g 3.375 g 12.5 mL/hr       Antimicrobials this admission: Ancef  periop 4/15 - 4/17 Vanc 4/19 > c Merrem  4/19 > 4/21 Zosyn  4/21 > c  Microbiology results: 4/19 BAL: normal respiratory flora 4/15 MRSA PCR: negative, MSSA positive  Thank you for allowing pharmacy to participate in this patient's care,  Albino Alu, PharmD PGY2 Cardiology Pharmacy Resident Phone: [phone] 02/08/2024 7:32 AM  Please check AMION for all [facility] Pharm[facility] phone numbers After 10:00 PM, call Main Pharmacy [phone]

## 2024-02-08 NOTE — Progress Notes (Signed)
 Dustin Davenport, MRN:  [id number], DOB:  1956-01-28, LOS: 7 ADMISSION DATE:  02/01/2024, CONSULTATION DATE:  02/03/24 REFERRING MD:  Sherene Dilling, CHIEF COMPLAINT:  chest pain   History of Present Illness:  68 year old man w/ hx of HTN, obesity who presented on 4/15 with sudden onset chest pain.  EKG showing inferior STEMI and went to cath lab for RCA DES x 3.  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  Taken emergently to OR for Bentall+ ascending/arch replacement.  Procedure complicated by coagulopathy related to antiplatelet therapy from recent stent, multiple liters of blood products given.  Returned to ICU 4/16 on vent and PCCM consulted to assist with vent wean on 4/17.  Currently heavily sedated; attempts at weaning resulted in agitation without ability to redirect.  Pertinent  Medical History   Past Medical History:  Diagnosis Date   Hypertension      Significant Hospital Events: Including procedures, antibiotic start and stop dates in addition to other pertinent events   4/16 RCA stent then Bentall, aortic arch replacement 4/18 extubated 4/19 reintubated in afternoon for aspiration, bronch  Interim History / Subjective:  Remain on epinephrine  at 2.5 mics Did not have to have bowel movement despite smog enema Afebrile  Objective   Blood pressure 126/68, pulse 69, temperature 98.4 F (36.9 C), resp. rate 20, height 6\' 1"  (1.854 m), weight 133.1 kg, SpO2 100%. PAP: (29-49)/(11-25) 37/13 CVP:  [7 mmHg-22 mmHg] 8 mmHg CO:  [6.7 L/min] 6.7 L/min CI:  [2.7 L/min/m2] 2.7 L/min/m2  Vent Mode: PRVC FiO2 (%):  [40 %-50 %] 40 % Set Rate:  [20 bmp] 20 bmp Vt Set:  [560 mL] 560 mL PEEP:  [5 cmH20-10 cmH20] 5 cmH20 Plateau Pressure:  [15 cmH20-21 cmH20] 17 cmH20   Intake/Output Summary (Last 24 hours) at 02/08/2024 8657 Last data filed at 02/08/2024 0600 Gross per 24 hour  Intake 2745.27 ml   Output 1695 ml  Net 1050.27 ml   Filed Weights   02/06/24 0356 02/07/24 0352 02/08/24 0500  Weight: 129.8 kg 126.6 kg 133.1 kg    Examination: General: Crtitically ill-appearing obese male, orally intubated HEENT: [place]/AT, eyes anicteric.  ETT and OGT in place Neuro: Eyes closed, tries to open with vocal stimuli, not following commands consistently. 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 Acute respiratory failure with hypoxia and hypercapnia Bilateral multifocal pneumonia Shock combined cardiogenic/septic Acute kidney injury due to ischemic ATN from shock Hypokalemia Shock liver, improving Acute encephalopathy, likely septic Perioperative acute blood loss anemia and coagulopathy Obesity Constipation  Continue aspirin , Plavix  and atorvastatin  TCTS following Continue lung protective ventilation FiO2 titrated down to 40% and PEEP of 5 Tolerating spontaneous breathing trial but with generalized edema requires diuresis before trial of extubation again  VAP prevention bundle in place PAD protocol with Precedex  and fentanyl  Will try to decrease Precedex  to 1.2 mics per KG Respiratory culture is growing gram variable rods, sensitivities are pending Vancomycin  was stopped Meropenem  was switched to Zosyn  White count is trending down Titrate vasopressor support with  MAP goal 65 Serum creatinine continued to improve, now down to 1.6 Avoid nephrotoxic agent Continue aggressive electrolyte replacement Started on Lasix  60 mg twice daily LFTs continue to improve Avoid deep sedation, RASS goal -1/-2 H&H is stable between 7-8 Diet and exercise counseling as appropriate Started on lactulose  20 g every 2 hours until 2 bowel movements   Best Practice (right click and "Reselect all SmartList Selections" daily)   Diet/type: Tube feeds DVT prophylaxis per TCTS Pressure ulcer(s): N/A GI prophylaxis: PPI Lines: Central line Foley:  Yes, and it is still needed Code Status:  full code Last date of multidisciplinary goals of care discussion [updated at bedside]  The patient is critically ill due to acute respiratory failure with hypoxia and hypercapnia/septic shock. critical care was necessary to treat or prevent imminent or life-threatening deterioration.  Critical care was time spent personally by me on the following activities: development of treatment plan with patient and/or surrogate as well as nursing, discussions with consultants, evaluation of patient's response to treatment, examination of patient, obtaining history from patient or surrogate, ordering and performing treatments and interventions, ordering and review of laboratory studies, ordering and review of radiographic studies, pulse oximetry, re-evaluation of patient's condition and participation in multidisciplinary rounds.   During this encounter critical care time was devoted to patient care services described in this note for 35 minutes.     Trevor Fudge, MD [facility] See Amion for pager If no response to pager, please call [phone] until 7pm After 7pm, Please call E-link [phone]

## 2024-02-08 NOTE — Progress Notes (Signed)
 7 Days Post-Op Procedure(s) (LRB): REPAIR OF ASCENDING AORTIC ANEURYSM USING A 32X12X10X10X10MM HEMASHIELD PLATINUM GRAFT. (N/A) ECHOCARDIOGRAM, TRANSESOPHAGEAL, INTRAOPERATIVE (N/A) BENTALL PROCEDURE USING A KONECT RESILIA AORTIC VALVE CONDUIT (N/A) Subjective:  Hemodynamics stable overnight.   Co-ox 65.8 on epi 2.5 being titrated for BP.    Maintaining NSR 70 on amio 30/hr. Dropped to 40's when turned yesterday.   + 1175 cc yesterday. Wt up 14 lbs from yesterday but yesterday's wt was down 7 lbs from the day before despite being positive. I think the wts are fairly inaccurate done in bed but he looks 20-30 lbs over baseline to me.   Remains sedated on vent, fentanyl  200/hr, Dex 1.5. Reportedly still agitated when he wakes up.  Objective: Vital signs in last 24 hours: Temp:  [97 F (36.1 C)-98.8 F (37.1 C)] 98.4 F (36.9 C) (04/22 0745) Pulse Rate:  [64-152] 69 (04/22 0745) Cardiac Rhythm: Normal sinus rhythm (04/22 0400) Resp:  [0-23] 20 (04/22 0745) SpO2:  [90 %-100 %] 100 % (04/22 0745) Arterial Line BP: (87-218)/(42-116) 108/50 (04/22 0745) FiO2 (%):  [40 %-50 %] 40 % (04/22 0744) Weight:  [133.1 kg] 133.1 kg (04/22 0500)  Hemodynamic parameters for last 24 hours: PAP: (29-49)/(11-25) 37/13 CVP:  [7 mmHg-22 mmHg] 8 mmHg CO:  [6.7 L/min] 6.7 L/min CI:  [2.7 L/min/m2] 2.7 L/min/m2  Intake/Output from previous day: 04/21 0701 - 04/22 0700 In: 2869.9 [I.V.:2166.1; NG/GT:403.8; IV Piggyback:300] Out: 1695 [Urine:1695] Intake/Output this shift: No intake/output data recorded.  General appearance: sedated on vent. anasarca Neurologic: unable to assess  Heart: regular rate and rhythm, S1, S2 normal, no murmur Lungs: clear to auscultation bilaterally Abdomen: soft, non-tender; bowel sounds normal Extremities: edema marked Wound: incisions healing well  Lab Results: Recent Labs    02/07/24 0241 02/07/24 0840 02/08/24 0043 02/08/24 0410  WBC 23.0*  --  21.2*   --   HGB 8.0*   < > 7.7* 8.2*  HCT 24.5*   < > 24.5* 24.0*  PLT 141*  --  207  --    < > = values in this interval not displayed.   BMET:  Recent Labs    02/07/24 1321 02/07/24 1600 02/08/24 0403 02/08/24 0410  NA 146*   < > 147* 149*  K 4.1   < > 3.1* 3.1*  CL 107  --  112*  --   CO2 26  --  27  --   GLUCOSE 122*  --  162*  --   BUN 55*  --  58*  --   CREATININE 1.93*  --  1.64*  --   CALCIUM  8.1*  --  8.0*  --    < > = values in this interval not displayed.    PT/INR: No results for input(s): "LABPROT", "INR" in the last 72 hours. ABG    Component Value Date/Time   PHART 7.436 02/08/2024 0410   HCO3 27.9 02/08/2024 0410   TCO2 29 02/08/2024 0410   ACIDBASEDEF 4.0 (H) 02/02/2024 0859   O2SAT 93 02/08/2024 0410   CBG (last 3)  Recent Labs    02/07/24 1937 02/07/24 2321 02/08/24 0409  GLUCAP 142* 160* 157*   CXR: stable left basilar atelectasis and small left pleural effusion.  Assessment/Plan: S/P Procedure(s) (LRB): REPAIR OF ASCENDING AORTIC ANEURYSM USING A 32X12X10X10X10MM HEMASHIELD PLATINUM GRAFT. (N/A) ECHOCARDIOGRAM, TRANSESOPHAGEAL, INTRAOPERATIVE (N/A) BENTALL PROCEDURE USING A KONECT RESILIA AORTIC VALVE CONDUIT (N/A)  POD 7 Hemodynamics fairly stable on low dose epi with good  Co-ox, maintaining sinus rhythm on amio.   VDRF: Reintubated for hypoxemic respiratory failure. Bronch with copious secretions that he was not clearing. Culture of BAL growing few Gram variable rods and rare yeast. CCM following.   AKI: creat continues to improve. He needs continued diuresis. Did not receive any diuretic yesterday. He is not going to wean off vent successfully with this much extra fluid on board.   Zosyn  and vanc for possible pneumonia with leukocytosis and elevated procalcitonin.   Tolerating tube feeds. Advance to goal of 60.  Hypokalemia: replete.  LOS: 7 days    Bartley Lightning 02/08/2024

## 2024-02-08 NOTE — Progress Notes (Signed)
 eLink Physician-Brief Progress Note [name] DOB: 10/05/1956 MRN: [id number]   Date of Service  02/08/2024  HPI/Events of Note  Patient with copious diarrhea secondary to his bowel regimen which has now been discontinued. Patient needs restraints renewed.  eICU Interventions  RN instructed to trial a rectal pouch since diarrhea should hopefully be of limited duration once bowel regimen discontinued. Restraints order renewed.        Shari Daughters Joua Bake 02/08/2024, 10:06 PM

## 2024-02-08 NOTE — Progress Notes (Signed)
 Advanced Heart Failure Rounding Note  Cardiologist: None  Chief Complaint: STEMI> Cardiogenic shock> Now s/p aortic dissection repair + Bentall  Significant events:    - Echo 4/15: AAA measuring 5-5.7 cm. Followed by emergent TEE 4/15 which was significant for torrential aortic regurgitation and dissection flap extending from the aortic root to the proximal ascending aorta  - Emergent TEE 4/15: significant for torrential aortic regurgitation and dissection flap extending from the aortic root to the proximal ascending aorta  - CT C/A/P: Showed acute type A dissection with a 5.5 cm ascending aortic aneurysm extending into arch. Dissection goes down to infrarenal aorta and extends up into innominate and left common carotid arterie  - Taken to OR 4/15: Now s/p aortic dissection repair 02/02/24 (Bentall/AVR and reimplantation of coronaries. Intra-op course c/b coagulopathy due to Effient  (PCI of RCA earlier in the day)).  - Extubated 4/18 - Echo EF 20-25% RV severely down. AVR ok - Re-intubated 4/19. Bronch with diffuse mucopurulent secretions    Subjective:    PICC line placed this morning, on low dose epipnerine currently. Volume status stable, increasing Na and Cl do not suggest intravascular volume overload. Reassess CVP after PICC placement, can give dose of IV lasix  following.    Free water  flushes to increase today. Slowly weaning sedation in hope of extubation tomorrow.      Objective:    Weight Range: 133.1 kg Body mass index is 38.71 kg/m.   Vital Signs:   Temp:  [97 F (36.1 C)-98.8 F (37.1 C)] 98.4 F (36.9 C) (04/22 1015) Pulse Rate:  [64-152] 77 (04/22 1015) Resp:  [0-24] 14 (04/22 1015) SpO2:  [90 %-100 %] 98 % (04/22 1015) Arterial Line BP: (87-150)/(40-75) 115/50 (04/22 1015) FiO2 (%):  [40 %-50 %] 40 % (04/22 0850) Weight:  [133.1 kg] 133.1 kg (04/22 0500) Last BM Date : 02/01/24  Weight change: Filed Weights   02/06/24 0356 02/07/24 0352 02/08/24  0500  Weight: 129.8 kg 126.6 kg 133.1 kg   Intake/Output:  Intake/Output Summary (Last 24 hours) at 02/08/2024 1113 Last data filed at 02/08/2024 1032 Gross per 24 hour  Intake 3075.62 ml  Output 1545 ml  Net 1530.62 ml    Physical Exam   General: Intubated HEENT: + ETT Neck: supple.  RIJ swan Cor: RRR, systolic murmur Lungs: coarse, ventilated breath sounds Abdomen: obese, soft, nondistended Extremities: no cyanosis, clubbing, rash,1+ edema Neuro: intubated/sedated  Telemetry   SR 70s personally reviewed  Labs    CBC Recent Labs    02/07/24 0241 02/07/24 0840 02/08/24 0043 02/08/24 0410  WBC 23.0*  --  21.2*  --   HGB 8.0*   < > 7.7* 8.2*  HCT 24.5*   < > 24.5* 24.0*  MCV 88.8  --  90.1  --   PLT 141*  --  207  --    < > = values in this interval not displayed.   Basic Metabolic Panel Recent Labs    78/29/56 0241 02/07/24 0840 02/07/24 1321 02/07/24 1600 02/08/24 0043 02/08/24 0403 02/08/24 0410  NA 145   < > 146*   < >  --  147* 149*  K 3.1*   < > 4.1   < >  --  3.1* 3.1*  CL 103  --  107  --   --  112*  --   CO2 27  --  26  --   --  27  --   GLUCOSE 106*  --  122*  --   --  162*  --   BUN 58*  --  55*  --   --  58*  --   CREATININE 2.27*  --  1.93*  --   --  1.64*  --   CALCIUM  8.5*  --  8.1*  --   --  8.0*  --   MG 2.0  --   --   --  2.1  --   --   PHOS 3.3  --   --   --  2.6 2.7  --    < > = values in this interval not displayed.   Liver Function Tests Recent Labs    02/07/24 0241 02/08/24 0043 02/08/24 0403  AST 255* 162*  --   ALT 86* 74*  --   ALKPHOS 89 117  --   BILITOT 1.8* 1.6*  --   PROT 4.8* 4.7*  --   ALBUMIN  2.6* 2.2* 2.2*   D-Dimer No results for input(s): "DDIMER" in the last 72 hours.  Hemoglobin A1C No results for input(s): "HGBA1C" in the last 72 hours.  Medications:    Scheduled Medications:  aspirin   81 mg Per Tube Daily   atorvastatin   80 mg Per Tube Daily   bisacodyl   10 mg Rectal Q0600   Chlorhexidine   Gluconate Cloth  6 each Topical Daily   clonazePAM   1 mg Per Tube BID   clopidogrel   75 mg Per Tube Daily   feeding supplement (PROSource TF20)  60 mL Per Tube QID   free water   100 mL Per Tube Q6H   insulin  aspart  0-9 Units Subcutaneous Q4H   lactulose   20 g Per Tube Q2H   methocarbamol  (ROBAXIN ) injection  500 mg Intravenous Q8H   metoCLOPramide  (REGLAN ) injection  5 mg Intravenous Q8H   mouth rinse  15 mL Mouth Rinse Q2H   pantoprazole  (PROTONIX ) IV  40 mg Intravenous QHS   senna-docusate  1 tablet Per Tube BID   sodium chloride  flush  10-40 mL Intracatheter Q12H   Infusions:  amiodarone  30 mg/hr (02/08/24 1032)   dexmedetomidine  (PRECEDEX ) IV infusion 1.3 mcg/kg/hr (02/08/24 1032)   epinephrine  2.5 mcg/min (02/08/24 1032)   feeding supplement (VITAL 1.5 CAL) 40 mL/hr at 02/08/24 1032   fentaNYL  infusion INTRAVENOUS 200 mcg/hr (02/08/24 1032)   piperacillin -tazobactam (ZOSYN )  IV Stopped (02/08/24 0909)   vancomycin      PRN Medications: acetaminophen  (TYLENOL ) oral liquid 160 mg/5 mL, fentaNYL , midazolam , ondansetron  (ZOFRAN ) IV, mouth rinse, sodium chloride  flush  Patient Profile   Dustin Davenport is a 68 y.o. male with HTN and HLD. Admitted with STEMI now with CGS. Found to have AAA and taken emergently to the OR. Now s/p Bentall/AVR and reimplantation of coronaries.  Assessment/Plan   Acute type A aortic dissection  - Echo 4/15: AAA measuring 5-5.7 cm - Emergent TEE 4/15: significant for torrential aortic regurgitation and dissection flap extending from the aortic root to the proximal ascending aorta  - CT C/A/P: Showed acute type A dissection with a 5.5 cm ascending aortic aneurysm extending into arch. Dissection goes down to infrarenal aorta and extends up into innominate and left common carotid arteries  - Now s/p aortic dissection repair 02/01/24 (Bentall/AVR and reimplantation of coronaries.  2. CAD, Acute inferior STEMI - Admitted with STEMI. Post PCI found to also  have aortic dissection - LHC with single vessel CAD involving the mid to distal RCA, successful PCI of the RCA with overlapping DES x  3. - Continue aspirin  81mg  daily - Continue plavix  75mg  daily - Continue atorvastatin  80mg  daily  3. Acute systolic HF ->  Cardiogenic shock - Post perfusion CGS with wide pulse pressure - Echo 4/19: EF 25% RV moderate to severely decreased - Co-ox 65%, on low dose epinephrine   - Swan removed, will place PICC and remove venous sheath - IV lasix  80mg  x1  4. Post-op hypoxic resp failure - extubated 4/18.  - re-intubated 4/19 - diffuse mucopurulent secretions on bronch 4/19. BAL GS: Mod WBC. Few gram variable rods. Rare yeast - Now on vanc/zosyn . Cultures with respiratory flora - Plan for 7 day course - Improving, would not treat rare yeast - CCM following   5. PAF with post-termination pauses PVCs - Patient with AF intra-op.  - Now back in NSR. Continue IV amio  - Transition to PO when off vasopressors  6. Hypokalemia - K 3.1. will supp   7. AKI due to ATN/shock - Baseline SCr ~1.3-1.4 - SCr peaked at 3.2, now 1.64, suspect ATN with hypotension  8. ABLA post-op - Hgb 8 today - Received 6uPRBCs, 6 plt, 4 FFP and 5 cryo intra-op.   - Follow H&H.If hgb <8 tomorrow will give 1x RBC   CRITICAL CARE Performed by: [name]   Total critical care time: 37 minutes  Critical care time was exclusive of separately billable procedures and treating other patients.  Critical care was necessary to treat or prevent imminent or life-threatening deterioration.  Critical care was time spent personally by me on the following activities: development of treatment plan with patient and/or surrogate as well as nursing, discussions with consultants, evaluation of patient's response to treatment, examination of patient, obtaining history from patient or surrogate, ordering and performing treatments and interventions, ordering and review of laboratory  studies, ordering and review of radiographic studies, pulse oximetry and re-evaluation of patient's condition.     Length of Stay: 7  [name], MD  02/08/2024, 11:13 AM  Advanced Heart Failure Team Pager [phone] (M-F; 7a - 5p)  Please contact [facility] for night-coverage after hours (5p -7a ) and weekends on [email or web address]

## 2024-02-09 ENCOUNTER — Other Ambulatory Visit: Payer: Self-pay | Admitting: Cardiology

## 2024-02-09 DIAGNOSIS — I2111 ST elevation (STEMI) myocardial infarction involving right coronary artery: Secondary | ICD-10-CM | POA: Diagnosis not present

## 2024-02-09 DIAGNOSIS — I7101 Dissection of ascending aorta: Secondary | ICD-10-CM | POA: Diagnosis not present

## 2024-02-09 DIAGNOSIS — J9602 Acute respiratory failure with hypercapnia: Secondary | ICD-10-CM | POA: Diagnosis not present

## 2024-02-09 DIAGNOSIS — I5021 Acute systolic (congestive) heart failure: Secondary | ICD-10-CM | POA: Diagnosis not present

## 2024-02-09 DIAGNOSIS — E871 Hypo-osmolality and hyponatremia: Secondary | ICD-10-CM

## 2024-02-09 DIAGNOSIS — R570 Cardiogenic shock: Secondary | ICD-10-CM | POA: Diagnosis not present

## 2024-02-09 DIAGNOSIS — Z952 Presence of prosthetic heart valve: Secondary | ICD-10-CM

## 2024-02-09 DIAGNOSIS — J9601 Acute respiratory failure with hypoxia: Secondary | ICD-10-CM | POA: Diagnosis not present

## 2024-02-09 LAB — BASIC METABOLIC PANEL WITH GFR
Anion gap: 12 (ref 5–15)
Anion gap: 14 (ref 5–15)
BUN: 58 mg/dL — ABNORMAL HIGH (ref 8–23)
BUN: 62 mg/dL — ABNORMAL HIGH (ref 8–23)
CO2: 26 mmol/L (ref 22–32)
CO2: 24 mmol/L (ref 22–32)
Calcium: 8.1 mg/dL — ABNORMAL LOW (ref 8.9–10.3)
Calcium: 8.2 mg/dL — ABNORMAL LOW (ref 8.9–10.3)
Chloride: 115 mmol/L — ABNORMAL HIGH (ref 98–111)
Chloride: 117 mmol/L — ABNORMAL HIGH (ref 98–111)
Creatinine, Ser: 1.61 mg/dL — ABNORMAL HIGH (ref 0.61–1.24)
Creatinine, Ser: 1.71 mg/dL — ABNORMAL HIGH (ref 0.61–1.24)
GFR, Estimated: 46 mL/min — ABNORMAL LOW (ref >60)
GFR, Estimated: 43 mL/min — ABNORMAL LOW (ref >60)
Glucose, Bld: 189 mg/dL — ABNORMAL HIGH (ref 70–99)
Glucose, Bld: 209 mg/dL — ABNORMAL HIGH (ref 70–99)
Potassium: 2.8 mmol/L — ABNORMAL LOW (ref 3.5–5.1)
Potassium: 3.4 mmol/L — ABNORMAL LOW (ref 3.5–5.1)
Sodium: 153 mmol/L — ABNORMAL HIGH (ref 135–145)
Sodium: 155 mmol/L — ABNORMAL HIGH (ref 135–145)

## 2024-02-09 LAB — COOXEMETRY PANEL
Carboxyhemoglobin: 1.7 % — ABNORMAL HIGH (ref 0.5–1.5)
Methemoglobin: <0.7 % (ref 0.0–1.5)
O2 Saturation: 69.2 %
Total hemoglobin: 8.8 g/dL — ABNORMAL LOW (ref 12.0–16.0)

## 2024-02-09 LAB — HEPATIC FUNCTION PANEL
ALT: 67 U/L — ABNORMAL HIGH (ref 0–44)
AST: 126 U/L — ABNORMAL HIGH (ref 15–41)
Albumin: 2.2 g/dL — ABNORMAL LOW (ref 3.5–5.0)
Alkaline Phosphatase: 131 U/L — ABNORMAL HIGH (ref 38–126)
Bilirubin, Direct: 0.5 mg/dL — ABNORMAL HIGH (ref 0.0–0.2)
Indirect Bilirubin: 1.1 mg/dL — ABNORMAL HIGH (ref 0.3–0.9)
Total Bilirubin: 1.6 mg/dL — ABNORMAL HIGH (ref 0.0–1.2)
Total Protein: 5 g/dL — ABNORMAL LOW (ref 6.5–8.1)

## 2024-02-09 LAB — GLUCOSE, CAPILLARY
Glucose-Capillary: 100 mg/dL — ABNORMAL HIGH (ref 70–99)
Glucose-Capillary: 131 mg/dL — ABNORMAL HIGH (ref 70–99)
Glucose-Capillary: 133 mg/dL — ABNORMAL HIGH (ref 70–99)
Glucose-Capillary: 133 mg/dL — ABNORMAL HIGH (ref 70–99)
Glucose-Capillary: 146 mg/dL — ABNORMAL HIGH (ref 70–99)
Glucose-Capillary: 148 mg/dL — ABNORMAL HIGH (ref 70–99)
Glucose-Capillary: 172 mg/dL — ABNORMAL HIGH (ref 70–99)

## 2024-02-09 LAB — CBC
HCT: 26.9 % — ABNORMAL LOW (ref 39.0–52.0)
Hemoglobin: 8.3 g/dL — ABNORMAL LOW (ref 13.0–17.0)
MCH: 27.9 pg (ref 26.0–34.0)
MCHC: 30.9 g/dL (ref 30.0–36.0)
MCV: 90.3 fL (ref 80.0–100.0)
Platelets: 304 10*3/uL (ref 150–400)
RBC: 2.98 MIL/uL — ABNORMAL LOW (ref 4.22–5.81)
RDW: 19 % — ABNORMAL HIGH (ref 11.5–15.5)
WBC: 18.2 10*3/uL — ABNORMAL HIGH (ref 4.0–10.5)
nRBC: 12 % — ABNORMAL HIGH (ref 0.0–0.2)

## 2024-02-09 LAB — PHOSPHORUS: Phosphorus: 3 mg/dL (ref 2.5–4.6)

## 2024-02-09 LAB — MAGNESIUM: Magnesium: 1.9 mg/dL (ref 1.7–2.4)

## 2024-02-09 MED ORDER — NOREPINEPHRINE 4 MG/250ML-% IV SOLN
0.0000 ug/min | INTRAVENOUS | Status: DC
Start: 2024-02-09 — End: 2024-02-12
  Administered 2024-02-09: 2 ug/min via INTRAVENOUS
  Administered 2024-02-10: 8 ug/min via INTRAVENOUS
  Administered 2024-02-10: 3 ug/min via INTRAVENOUS
  Administered 2024-02-11: 4 ug/min via INTRAVENOUS
  Filled 2024-02-09 (×4): qty 250

## 2024-02-09 MED ORDER — SODIUM CHLORIDE 0.9 % IV SOLN
1.0000 g | Freq: Three times a day (TID) | INTRAVENOUS | Status: DC
  Administered 2024-02-09 – 2024-02-17 (×23): 1 g via INTRAVENOUS
  Filled 2024-02-09 (×23): qty 20

## 2024-02-09 MED ORDER — DEXMEDETOMIDINE HCL IN NACL 400 MCG/100ML IV SOLN
0.0000 ug/kg/h | INTRAVENOUS | Status: DC
  Administered 2024-02-09: 1.2 ug/kg/h via INTRAVENOUS
  Administered 2024-02-10 (×4): 1.5 ug/kg/h via INTRAVENOUS
  Administered 2024-02-10: 1.4 ug/kg/h via INTRAVENOUS
  Administered 2024-02-10: 1.5 ug/kg/h via INTRAVENOUS
  Administered 2024-02-10: 1.3 ug/kg/h via INTRAVENOUS
  Administered 2024-02-10: 1.5 ug/kg/h via INTRAVENOUS
  Administered 2024-02-10: 1.3 ug/kg/h via INTRAVENOUS
  Administered 2024-02-10 – 2024-02-11 (×3): 1.5 ug/kg/h via INTRAVENOUS
  Administered 2024-02-11: 1.4 ug/kg/h via INTRAVENOUS
  Administered 2024-02-11: 1.5 ug/kg/h via INTRAVENOUS
  Administered 2024-02-11: 1 ug/kg/h via INTRAVENOUS
  Administered 2024-02-11: 1.3 ug/kg/h via INTRAVENOUS
  Filled 2024-02-09 (×18): qty 100

## 2024-02-09 MED ORDER — POTASSIUM CHLORIDE 10 MEQ/50ML IV SOLN
10.0000 meq | INTRAVENOUS | Status: AC
Start: 2024-02-09 — End: 2024-02-09
  Administered 2024-02-09 (×2): 10 meq via INTRAVENOUS
  Filled 2024-02-09 (×2): qty 50

## 2024-02-09 MED ORDER — FUROSEMIDE 10 MG/ML IJ SOLN
80.0000 mg | Freq: Once | INTRAMUSCULAR | Status: AC
  Administered 2024-02-09: 80 mg via INTRAVENOUS
  Filled 2024-02-09: qty 8

## 2024-02-09 MED ORDER — FREE WATER
100.0000 mL | Status: DC
  Administered 2024-02-09 – 2024-02-10 (×6): 100 mL

## 2024-02-09 MED ORDER — MAGNESIUM SULFATE 2 GM/50ML IV SOLN
2.0000 g | Freq: Once | INTRAVENOUS | Status: AC
  Administered 2024-02-09: 2 g via INTRAVENOUS
  Filled 2024-02-09: qty 50

## 2024-02-09 MED ORDER — ACETAMINOPHEN 160 MG/5ML PO SOLN
1000.0000 mg | Freq: Four times a day (QID) | ORAL | Status: AC | PRN
  Administered 2024-02-10 – 2024-02-18 (×3): 1000 mg
  Filled 2024-02-09 (×3): qty 40.6

## 2024-02-09 MED ORDER — POTASSIUM CHLORIDE 20 MEQ PO PACK
40.0000 meq | PACK | ORAL | Status: AC
  Administered 2024-02-09 (×2): 40 meq
  Filled 2024-02-09 (×2): qty 2

## 2024-02-09 MED ORDER — POTASSIUM CHLORIDE 20 MEQ PO PACK: 40.0000 meq | PACK | ORAL | Status: DC

## 2024-02-09 NOTE — Progress Notes (Signed)
 Advanced Heart Failure Rounding Note  Cardiologist: None  Chief Complaint: STEMI> Cardiogenic shock> Now s/p aortic dissection repair + Bentall  Significant events:    - Echo 4/15: AAA measuring 5-5.7 cm. Followed by emergent TEE 4/15 which was significant for torrential aortic regurgitation and dissection flap extending from the aortic root to the proximal ascending aorta  - Emergent TEE 4/15: significant for torrential aortic regurgitation and dissection flap extending from the aortic root to the proximal ascending aorta  - CT C/A/P: Showed acute type A dissection with a 5.5 cm ascending aortic aneurysm extending into arch. Dissection goes down to infrarenal aorta and extends up into innominate and left common carotid arterie  - Taken to OR 4/15: Now s/p aortic dissection repair 02/02/24 (Bentall/AVR and reimplantation of coronaries. Intra-op course c/b coagulopathy due to Effient  (PCI of RCA earlier in the day)).  - Extubated 4/18 - Echo EF 20-25% RV severely down. AVR ok - Re-intubated 4/19. Bronch with diffuse mucopurulent secretions    Subjective:    Continued diuresis overnight and today, though worsening hypernatemia/hyperchloremia. Will give additional dose IV 80mg  lasix  today, given Epi use will not add spironolactone.   Hopefully weaning sedation with the goal of extubation today.   On Epi 5mcg/min this morning, will wean as tolerated and hopefully can come off after sedation.      Objective:    Weight Range: 127.7 kg Body mass index is 37.14 kg/m.   Vital Signs:   Temp:  [98.4 F (36.9 C)-100.6 F (38.1 C)] 100.6 F (38.1 C) (04/23 1057) Pulse Rate:  [72-88] 86 (04/23 1057) Resp:  [13-31] 28 (04/23 1057) BP: (87-127)/(46-78) 122/59 (04/23 0946) SpO2:  [92 %-100 %] 100 % (04/23 1057) Arterial Line BP: (62-170)/(39-79) 145/60 (04/23 1000) FiO2 (%):  [40 %] 40 % (04/23 1057) Weight:  [127.7 kg] 127.7 kg (04/23 0150) Last BM Date : 02/09/24  Weight  change: Filed Weights   02/07/24 0352 02/08/24 0500 02/09/24 0150  Weight: 126.6 kg 133.1 kg 127.7 kg   Intake/Output:  Intake/Output Summary (Last 24 hours) at 02/09/2024 1156 Last data filed at 02/09/2024 1100 Gross per 24 hour  Intake 3991.77 ml  Output 7200 ml  Net -3208.23 ml    Physical Exam   General: Intubated HEENT: + ETT Cor: RRR, systolic murmur, 1+ LE edmea Lungs: coarse, ventilated breath sounds Abdomen: obese, soft, nondistended Neuro: intubated/sedated  Telemetry   SR 70s personally reviewed  Labs    CBC Recent Labs    02/08/24 0043 02/08/24 0410 02/09/24 0501  WBC 21.2*  --  18.2*  HGB 7.7* 8.2* 8.3*  HCT 24.5* 24.0* 26.9*  MCV 90.1  --  90.3  PLT 207  --  304   Basic Metabolic Panel Recent Labs    40/98/11 0043 02/08/24 0403 02/08/24 0410 02/09/24 0501  NA  --  147* 149* 153*  K  --  3.1* 3.1* 2.8*  CL  --  112*  --  115*  CO2  --  27  --  26  GLUCOSE  --  162*  --  189*  BUN  --  58*  --  58*  CREATININE  --  1.64*  --  1.61*  CALCIUM   --  8.0*  --  8.1*  MG 2.1  --   --  1.9  PHOS 2.6 2.7  --  3.0   Liver Function Tests Recent Labs    02/08/24 0043 02/08/24 0403 02/09/24 0501  AST 162*  --  126*  ALT 74*  --  67*  ALKPHOS 117  --  131*  BILITOT 1.6*  --  1.6*  PROT 4.7*  --  5.0*  ALBUMIN  2.2* 2.2* 2.2*   D-Dimer No results for input(s): "DDIMER" in the last 72 hours.  Hemoglobin A1C No results for input(s): "HGBA1C" in the last 72 hours.  Medications:    Scheduled Medications:  aspirin   81 mg Per Tube Daily   atorvastatin   80 mg Per Tube Daily   Chlorhexidine  Gluconate Cloth  6 each Topical Daily   clonazePAM   1 mg Per Tube BID   clopidogrel   75 mg Per Tube Daily   feeding supplement (PROSource TF20)  60 mL Per Tube TID   free water   100 mL Per Tube Q4H   insulin  aspart  0-9 Units Subcutaneous Q4H   methocarbamol  (ROBAXIN ) injection  500 mg Intravenous Q8H   metoCLOPramide  (REGLAN ) injection  5 mg Intravenous  Q8H   mouth rinse  15 mL Mouth Rinse Q2H   pantoprazole  (PROTONIX ) IV  40 mg Intravenous QHS   potassium chloride   40 mEq Per Tube Q4H   senna-docusate  1 tablet Per Tube BID   sodium chloride  flush  10-40 mL Intracatheter Q12H   Infusions:  amiodarone  30 mg/hr (02/09/24 1128)   dexmedetomidine  (PRECEDEX ) IV infusion 1.2 mcg/kg/hr (02/09/24 1100)   epinephrine  Stopped (02/09/24 1016)   feeding supplement (VITAL 1.5 CAL) 60 mL/hr at 02/09/24 1100   fentaNYL  infusion INTRAVENOUS Stopped (02/09/24 0848)   piperacillin -tazobactam (ZOSYN )  IV Stopped (02/09/24 1610)   vancomycin  1,750 mg (02/09/24 1106)   PRN Medications: acetaminophen  (TYLENOL ) oral liquid 160 mg/5 mL, fentaNYL , midazolam , ondansetron  (ZOFRAN ) IV, mouth rinse, sodium chloride  flush  Patient Profile   Dustin Davenport is a 68 y.o. male with HTN and HLD. Admitted with STEMI now with CGS. Found to have AAA and taken emergently to the OR. Now s/p Bentall/AVR and reimplantation of coronaries.  Assessment/Plan   Acute type A aortic dissection  - Echo 4/15: AAA measuring 5-5.7 cm - Emergent TEE 4/15: significant for torrential aortic regurgitation and dissection flap extending from the aortic root to the proximal ascending aorta  - CT C/A/P: Showed acute type A dissection with a 5.5 cm ascending aortic aneurysm extending into arch. Dissection goes down to infrarenal aorta and extends up into innominate and left common carotid arteries  - Now s/p aortic dissection repair 02/01/24 (Bentall/AVR and reimplantation of coronaries.  2. CAD, Acute inferior STEMI - Admitted with STEMI. Post PCI found to also have aortic dissection - LHC with single vessel CAD involving the mid to distal RCA, successful PCI of the RCA with overlapping DES x 3. - Continue aspirin  81mg  daily - Continue plavix  75mg  daily - Continue atorvastatin  80mg  daily  3. Acute systolic HF ->  Cardiogenic shock - Post perfusion CGS with wide pulse pressure - Echo  4/19: EF 25% RV moderate to severely decreased - Coox 69 this morning, CVP 8-10, IV diuresis yesterday with good result, though worsening sodium and chloride - IV lasix  80mg  x1 this morning, needs aggressive potassium supplementation  4. Post-op hypoxic resp failure - extubated 4/18.  - re-intubated 4/19 - diffuse mucopurulent secretions on bronch 4/19. BAL GS: Mod WBC. Few gram variable rods. Rare yeast - Now on vanc/zosyn . Cultures with respiratory flora - Plan for 7 day course - Leukocytosis slowly improving - CCM following   5. PAF with post-termination pauses PVCs - Patient with AF intra-op.  -  Now back in NSR. Continue IV amio  - Transition to PO when off vasopressors  6. Hypokalemia - K 2.8. will supp   7. AKI due to ATN/shock - Baseline SCr ~1.3-1.4 - SCr peaked at 3.2, now 1.6, suspect ATN with hypotension, improving  8. ABLA post-op - Hgb 8 today - Received 6uPRBCs, 6 plt, 4 FFP and 5 cryo intra-op.   - Follow H&H. Hgb 8.3 today   CRITICAL CARE Performed by: [name]   Total critical care time: 32 minutes  Critical care time was exclusive of separately billable procedures and treating other patients.  Critical care was necessary to treat or prevent imminent or life-threatening deterioration.  Critical care was time spent personally by me on the following activities: development of treatment plan with patient and/or surrogate as well as nursing, discussions with consultants, evaluation of patient's response to treatment, examination of patient, obtaining history from patient or surrogate, ordering and performing treatments and interventions, ordering and review of laboratory studies, ordering and review of radiographic studies, pulse oximetry and re-evaluation of patient's condition.     Length of Stay: 8  [name], MD  02/09/2024, 11:56 AM  Advanced Heart Failure Team Pager [phone] (M-F; 7a - 5p)  Please contact [facility] for  night-coverage after hours (5p -7a ) and weekends on [email or web address]

## 2024-02-09 NOTE — Progress Notes (Signed)
 Dustin Davenport, MRN:  [id number], DOB:  [birth year], LOS: 8 ADMISSION DATE:  02/01/2024, CONSULTATION DATE:  02/03/24 REFERRING MD:  Sherene Dilling, CHIEF COMPLAINT:  chest pain   History of Present Illness:  68 year old man w/ hx of HTN, obesity who presented on 4/15 with sudden onset chest pain.  EKG showing inferior STEMI and went to cath lab for RCA DES x 3.  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  Taken emergently to OR for Bentall+ ascending/arch replacement.  Procedure complicated by coagulopathy related to antiplatelet therapy from recent stent, multiple liters of blood products given.  Returned to ICU 4/16 on vent and PCCM consulted to assist with vent wean on 4/17.  Currently heavily sedated; attempts at weaning resulted in agitation without ability to redirect.  Pertinent  Medical History   Past Medical History:  Diagnosis Date   Hypertension      Significant Hospital Events: Including procedures, antibiotic start and stop dates in addition to other pertinent events   4/16 RCA stent then Bentall, aortic arch replacement 4/18 extubated 4/19 reintubated in afternoon for aspiration, bronch  Interim History / Subjective:  Vasopressor requirement went up, currently on 5 mics of epinephrine  Had multiple bowel movements overnight Remain afebrile White count is trending down Tolerated spontaneous breathing trial  Objective   Blood pressure (!) 100/54, pulse 83, temperature 99.5 F (37.5 C), resp. rate 18, height 6\' 1"  (1.854 m), weight 127.7 kg, SpO2 99%. CVP:  [10 mmHg-65 mmHg] 13 mmHg  Vent Mode: PRVC FiO2 (%):  [40 %] 40 % Set Rate:  [20 bmp] 20 bmp Vt Set:  [560 mL] 560 mL PEEP:  [5 cmH20] 5 cmH20 Pressure Support:  [5 cmH20] 5 cmH20 Plateau Pressure:  [16 cmH20-20 cmH20] 16 cmH20   Intake/Output Summary (Last 24 hours) at 02/09/2024 0751 Last data filed at 02/09/2024 0700 Gross  per 24 hour  Intake 3951.16 ml  Output 5730 ml  Net -1778.84 ml   Filed Weights   02/07/24 0352 02/08/24 0500 02/09/24 0150  Weight: 126.6 kg 133.1 kg 127.7 kg    Examination: General: Crtitically ill-appearing obese male, orally intubated HEENT: [place]/AT, eyes anicteric.  ETT and OGT in place Neuro: Eyes open, not following commands, not tracking examiner, pupils 3 mm bilateral reactive to light  Chest: Reduced air entry at the bases bilaterally, no wheezes or rhonchi Heart: Regular rate and rhythm, no murmurs or gallops Abdomen: Soft, nondistended, bowel sounds present Skin: No rash  Cr is stable 1.6 Na 153 K 2.8 LFTs continue to trend down WBC 18 H/H 8.3/26.9 BAL: Gram variable rods Coox 69%  I/O net - 1.58 L in last 24 hours, with UO 5.3L Echo LVEF 35% with RV dilatation and reduced function  Resolved Hospital Problem list   Constipation  Assessment & Plan:  Acute inferior wall STEMI status post PCI and DES 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 Acute respiratory failure with hypoxia and hypercapnia Bilateral multifocal pneumonia Shock combined cardiogenic/septic Acute kidney injury due to ischemic ATN from shock Hypokalemia/hyponatremia due to aggressive diuresis Shock liver, improving Acute encephalopathy, likely septic Perioperative acute blood loss anemia and coagulopathy Obesity  Continue aspirin , Plavix  and atorvastatin  TCTS following Continue lung protective ventilation Remain on 40% FiO2 and PEEP of 5 Tolerated spontaneous breathing trial but mental status precludes extubation Currently on spontaneous breathing trial, will try to  extubate him today VAP prevention bundle in place Will continue Precedex  and stop fentanyl  infusion Respiratory culture is growing gram variable rods, sensitivities are pending Continue vancomycin  and Zosyn  to complete 7 days therapy White count continue to trend down Titrate  vasopressor support with MAP goal 65 Currently on epinephrine  at 5 Serum creatinine is stable around 1.6 Avoid nephrotoxic agent Aggressive potassium replacement Increase free water  flushes to 100 mL every 4 hour Received Lasix  80 mg twice daily yesterday, this morning he received 80 mg x 1 again LFTs continue to improve Avoid deep sedation, RASS goal -1/-2 H&H is stable between 7-8 Diet and exercise counseling as appropriate Had few bowel movements yesterday, will stop lactulose   Best Practice (right click and "Reselect all SmartList Selections" daily)   Diet/type: Tube feeds DVT prophylaxis per TCTS Pressure ulcer(s): N/A GI prophylaxis: PPI Lines: Central line Foley:  Yes, and it is still needed Code Status:  full code Last date of multidisciplinary goals of care discussion [updated at bedside]  The patient is critically ill due to acute respiratory failure with hypoxia and hypercapnia/septic shock. critical care was necessary to treat or prevent imminent or life-threatening deterioration.  Critical care was time spent personally by me on the following activities: development of treatment plan with patient and/or surrogate as well as nursing, discussions with consultants, evaluation of patient's response to treatment, examination of patient, obtaining history from patient or surrogate, ordering and performing treatments and interventions, ordering and review of laboratory studies, ordering and review of radiographic studies, pulse oximetry, re-evaluation of patient's condition and participation in multidisciplinary rounds.   During this encounter critical care time was devoted to patient care services described in this note for 35 minutes.     Trevor Fudge, MD [facility] See Amion for pager If no response to pager, please call [phone] until 7pm After 7pm, Please call E-link [phone]

## 2024-02-09 NOTE — Progress Notes (Signed)
      [address]       [place] [address]             [phone]      POD # 8   BP (!) 124/56   Pulse 82   Temp (!) 100.9 F (38.3 C)   Resp (!) 29   Ht 6\' 1"  (1.854 m)   Wt 127.7 kg   SpO2 99%   BMI 37.14 kg/m  CVP 14 PRVC 20/40%/% PEEP Off epi, norepi @ 2  Intake/Output Summary (Last 24 hours) at 02/09/2024 1836 Last data filed at 02/09/2024 1700 Gross per 24 hour  Intake 3295.67 ml  Output 7520 ml  Net -4224.33 ml   K 3.4 up from 2.8 creatinine 1.71 stable  Supplement K  Dustin Szafran C. Luna Salinas, MD [facility] [phone]

## 2024-02-09 NOTE — Progress Notes (Signed)
 PHARMACY ANTIBIOTIC CONSULT NOTE   Dustin Davenport a 68 y.o. male admitted on 4/15 s/p type A dissection and Bentall repair and PCI/DES to RCA.  Pharmacy has been consulted for Vancomycin  and Zosyn  dosing.  Dr. Zaida Hertz would like to change Zosyn  back to meropenem  as pt worsening on Zosyn .  Estimated Creatinine Clearance: 57.9 mL/min (A) (by C-G formula based on SCr of 1.71 mg/dL (H)).  Plan: Meropenem  1gm IV q8h Continue Vancomycin  1750 mg every 24 hours. Predicted AUC 472, Predicted Css 10.5.  Monitor renal function, clinical status, de-escalation, C/S, levels as indicated   Allergies:  Allergies  Allergen Reactions   Bee Venom     Filed Weights   02/07/24 0352 02/08/24 0500 02/09/24 0150  Weight: 126.6 kg (279 lb 1.6 oz) 133.1 kg (293 lb 6.9 oz) 127.7 kg (281 lb 8.4 oz)       Latest Ref Rng & Units 02/09/2024    5:01 AM 02/08/2024    4:10 AM 02/08/2024   12:43 AM  CBC  WBC 4.0 - 10.5 K/uL 18.2   21.2   Hemoglobin 13.0 - 17.0 g/dL 8.3  8.2  7.7   Hematocrit 39.0 - 52.0 % 26.9  24.0  24.5   Platelets 150 - 400 K/uL 304   207     Antibiotics Given (last 72 hours)     Date/Time Action Medication Dose Rate   02/06/24 2053 New Bag/Given   vancomycin  (VANCOCIN ) IVPB 1000 mg/200 mL premix 1,000 mg 200 mL/hr   02/07/24 0418 New Bag/Given   meropenem  (MERREM ) 1 g in sodium chloride  0.9 % 100 mL IVPB 1 g 200 mL/hr   02/07/24 1313 New Bag/Given   piperacillin -tazobactam (ZOSYN ) IVPB 3.375 g 3.375 g 12.5 mL/hr   02/07/24 2158 New Bag/Given   piperacillin -tazobactam (ZOSYN ) IVPB 3.375 g 3.375 g 12.5 mL/hr   02/08/24 0508 New Bag/Given   piperacillin -tazobactam (ZOSYN ) IVPB 3.375 g 3.375 g 12.5 mL/hr   02/08/24 1157 New Bag/Given   vancomycin  (VANCOREADY) IVPB 1750 mg/350 mL 1,750 mg 175 mL/hr   02/08/24 1433 New Bag/Given   piperacillin -tazobactam (ZOSYN ) IVPB 3.375 g 3.375 g 12.5 mL/hr   02/08/24 2132 New Bag/Given   piperacillin -tazobactam (ZOSYN ) IVPB 3.375 g 3.375 g 12.5 mL/hr    02/09/24 0604 New Bag/Given   piperacillin -tazobactam (ZOSYN ) IVPB 3.375 g 3.375 g 12.5 mL/hr   02/09/24 1106 New Bag/Given   vancomycin  (VANCOREADY) IVPB 1750 mg/350 mL 1,750 mg 175 mL/hr   02/09/24 1459 New Bag/Given   piperacillin -tazobactam (ZOSYN ) IVPB 3.375 g 3.375 g 12.5 mL/hr       Antimicrobials this admission: Ancef  periop 4/15 - 4/17 Vanc 4/19 > c Merrem  4/19 > 4/21 Zosyn  4/21 > c  Microbiology results: 4/19 BAL: normal respiratory flora 4/15 MRSA PCR: negative, MSSA positive  Thank you for allowing pharmacy to participate in this patient's care,  Albino Alu, PharmD PGY2 Cardiology Pharmacy Resident Phone: [phone] 02/09/2024 6:25 PM  Please check AMION for all [facility] phone numbers After 10:00 PM, call Main Pharmacy [phone]

## 2024-02-09 NOTE — Plan of Care (Signed)
  Problem: Clinical Measurements: Goal: Will remain free from infection Outcome: Progressing Goal: Respiratory complications will improve Outcome: Progressing Goal: Cardiovascular complication will be avoided Outcome: Progressing   Problem: Nutrition: Goal: Adequate nutrition will be maintained Outcome: Progressing   Problem: Pain Managment: Goal: General experience of comfort will improve and/or be controlled Outcome: Progressing   Problem: Skin Integrity: Goal: Risk for impaired skin integrity will decrease Outcome: Progressing   Problem: Cardiac: Goal: Ability to achieve and maintain adequate cardiovascular perfusion will improve Outcome: Progressing   Problem: Cardiovascular: Goal: Ability to achieve and maintain adequate cardiovascular perfusion will improve Outcome: Progressing   Problem: Cardiac: Goal: Will achieve and/or maintain hemodynamic stability Outcome: Progressing   Problem: Respiratory: Goal: Respiratory status will improve Outcome: Progressing

## 2024-02-09 NOTE — TOC Progression Note (Signed)
 Transition of Care [facility]) - Progression Note    Patient Details  Name: Dustin Davenport MRN: [id number] Date of Birth: 07-Feb-1956  [facility] of [facility]) CM/SW Contact  Ernst Heap Phone Number: [phone] 02/09/2024, 2:41 PM  Clinical Narrative:   2:41 PM- HF CSW called paatients spouse [name] to check in and see if the family needs anything or has any questions. They do not need anything at this time.   Will continue to follow for dc needs.   TOC will continue following.     Expected Discharge Plan: Home w Home Health Services Barriers to Discharge: Continued Medical Work up  Expected Discharge Plan and Services   Discharge Planning Services: CM Consult   Living arrangements for the past 2 months: Single Family Home                                       Social Determinants of Health (SDOH) Interventions SDOH Screenings   Housing: Patient Unable To Answer (02/02/2024)  Transportation Needs: Patient Unable To Answer (02/02/2024)  Utilities: Patient Unable To Answer (02/02/2024)  Social Connections: Patient Unable To Answer (02/02/2024)    Readmission Risk Interventions     No data to display

## 2024-02-09 NOTE — Procedures (Signed)
 Cortrak  Tube Type:  Cortrak - 43 inches Tube Location:  Left nare Initial Placement:  Stomach Secured by: Bridle Technique Used to Measure Tube Placement:  Marking at nare/corner of mouth Cortrak Secured At:  70 cm   Cortrak Tube Team Note:  Consult received to place a Cortrak feeding tube.   No x-ray is required. RN may begin using tube.   If the tube becomes dislodged please keep the tube and contact the Cortrak team at [email or web address] for replacement.  If after hours and replacement cannot be delayed, place a NG tube and confirm placement with an abdominal x-ray.    Betsey Holiday MS, RD, LDN If unable to be reached, please send secure chat to "RD inpatient" available from 8:00a-4:00p daily

## 2024-02-09 NOTE — Progress Notes (Signed)
 eLink Physician-Brief Progress Note [name] DOB: July 31, 1956 MRN: [id number]   Date of Service  02/09/2024  HPI/Events of Note  Patient with tachypnea possibly secondary to sub-optimal sedation and analgesia (he is on a Precedex  gtt of 1.2 mcg and a Fentanyl  gtt of 50 mcg. He also needs his restraints renewed to prevent self-extubation.  eICU Interventions  Precedex  gtt ceiling increased to 1.5, I have also asked bedside RN to titrate up Fentanyl  gtt for comfort and then wean beginning at 4 am in anticipation of extubation after 7 am. I will also add PRN enteral Tylenol  1000 mg Q 6 hours PRN x 3 doses.        Marlynn Singer 02/09/2024, 9:38 PM

## 2024-02-09 NOTE — Progress Notes (Signed)
 Post op echo ordered

## 2024-02-09 NOTE — Progress Notes (Signed)
 8 Days Post-Op Procedure(s) (LRB): REPAIR OF ASCENDING AORTIC ANEURYSM USING A 32X12X10X10X10MM HEMASHIELD PLATINUM GRAFT. (N/A) ECHOCARDIOGRAM, TRANSESOPHAGEAL, INTRAOPERATIVE (N/A) BENTALL PROCEDURE USING A KONECT RESILIA AORTIC VALVE CONDUIT (N/A) Subjective:  Hemodynamics stable overnight but epi increased to 5 mcg to support BP.   Co-ox  69.2 this am. CVP 13.  Rhythm sinus 80's on IV amio 30.  -1240 cc yesterday with IV lasix . 4780 cc uo. Still getting a lot of IVF.  Objective: Vital signs in last 24 hours: Temp:  [98.2 F (36.8 C)-99.3 F (37.4 C)] 99.3 F (37.4 C) (04/23 0630) Pulse Rate:  [69-88] 83 (04/23 0630) Cardiac Rhythm: Normal sinus rhythm (04/22 2000) Resp:  [12-24] 17 (04/23 0630) BP: (87-116)/(46-63) 108/52 (04/23 0630) SpO2:  [92 %-100 %] 100 % (04/23 0630) Arterial Line BP: (62-149)/(39-79) 118/53 (04/23 0645) FiO2 (%):  [40 %] 40 % (04/23 0315) Weight:  [127.7 kg] 127.7 kg (04/23 0150)  Hemodynamic parameters for last 24 hours: CVP:  [10 mmHg-13 mmHg] 13 mmHg  Intake/Output from previous day: 04/22 0701 - 04/23 0700 In: 3889.3 [I.V.:2187.8; NG/GT:1109.5; IV Piggyback:592] Out: 5130 [Urine:4780; Stool:350] Intake/Output this shift: No intake/output data recorded.  General appearance: intubated and sedated.  Neurologic: unable to assess Heart: regular rate and rhythm, S1, S2 normal, no murmur Lungs: clear to auscultation bilaterally Abdomen: soft, non-tender; bowel sounds normal Extremities: warm, moderate edema. Pedal pulses strong. Wound: incisions healing well.  Lab Results: Recent Labs    02/08/24 0043 02/08/24 0410 02/09/24 0501  WBC 21.2*  --  18.2*  HGB 7.7* 8.2* 8.3*  HCT 24.5* 24.0* 26.9*  PLT 207  --  304   BMET:  Recent Labs    02/08/24 0403 02/08/24 0410 02/09/24 0501  NA 147* 149* 153*  K 3.1* 3.1* 2.8*  CL 112*  --  115*  CO2 27  --  26  GLUCOSE 162*  --  189*  BUN 58*  --  58*  CREATININE 1.64*  --  1.61*   CALCIUM  8.0*  --  8.1*    PT/INR: No results for input(s): "LABPROT", "INR" in the last 72 hours. ABG    Component Value Date/Time   PHART 7.436 02/08/2024 0410   HCO3 27.9 02/08/2024 0410   TCO2 29 02/08/2024 0410   ACIDBASEDEF 4.0 (H) 02/02/2024 0859   O2SAT 69.2 02/09/2024 0501   CBG (last 3)  Recent Labs    02/08/24 1631 02/08/24 2353 02/09/24 0451  GLUCAP 171* 146* 131*    Assessment/Plan: S/P Procedure(s) (LRB): REPAIR OF ASCENDING AORTIC ANEURYSM USING A 32X12X10X10X10MM HEMASHIELD PLATINUM GRAFT. (N/A) ECHOCARDIOGRAM, TRANSESOPHAGEAL, INTRAOPERATIVE (N/A) BENTALL PROCEDURE USING A KONECT RESILIA AORTIC VALVE CONDUIT (N/A)  POD 8 Hemodynamics fairly stable on titrating epi for BP with good Co-ox, maintaining sinus rhythm on amio.   VDRF: Reintubated for hypoxemic respiratory failure. Bronch with copious secretions that he was not clearing. Culture of BAL normal flora.  CCM following.   AKI: creat continues to improve. He needs continued diuresis. Excellent response to diuretic yesterday and creat remained stable.    Zosyn  and vanc for possible pneumonia with leukocytosis improving slowly. CXR looked ok yesterday.    Tolerating tube feeds. Advance to goal of 60.   Hypokalemia: replete.   LOS: 8 days    Dustin Davenport 02/09/2024

## 2024-02-10 ENCOUNTER — Inpatient Hospital Stay (HOSPITAL_COMMUNITY)

## 2024-02-10 DIAGNOSIS — J9602 Acute respiratory failure with hypercapnia: Secondary | ICD-10-CM | POA: Diagnosis not present

## 2024-02-10 DIAGNOSIS — I5021 Acute systolic (congestive) heart failure: Secondary | ICD-10-CM | POA: Diagnosis not present

## 2024-02-10 DIAGNOSIS — Z9889 Other specified postprocedural states: Secondary | ICD-10-CM

## 2024-02-10 DIAGNOSIS — J9601 Acute respiratory failure with hypoxia: Secondary | ICD-10-CM | POA: Diagnosis not present

## 2024-02-10 DIAGNOSIS — R570 Cardiogenic shock: Secondary | ICD-10-CM | POA: Diagnosis not present

## 2024-02-10 DIAGNOSIS — I2111 ST elevation (STEMI) myocardial infarction involving right coronary artery: Secondary | ICD-10-CM | POA: Diagnosis not present

## 2024-02-10 LAB — GLUCOSE, CAPILLARY
Glucose-Capillary: 133 mg/dL — ABNORMAL HIGH (ref 70–99)
Glucose-Capillary: 142 mg/dL — ABNORMAL HIGH (ref 70–99)
Glucose-Capillary: 149 mg/dL — ABNORMAL HIGH (ref 70–99)
Glucose-Capillary: 160 mg/dL — ABNORMAL HIGH (ref 70–99)
Glucose-Capillary: 178 mg/dL — ABNORMAL HIGH (ref 70–99)
Glucose-Capillary: 202 mg/dL — ABNORMAL HIGH (ref 70–99)

## 2024-02-10 LAB — RENAL FUNCTION PANEL
Albumin: 1.9 g/dL — ABNORMAL LOW (ref 3.5–5.0)
Anion gap: 11 (ref 5–15)
BUN: 61 mg/dL — ABNORMAL HIGH (ref 8–23)
CO2: 25 mmol/L (ref 22–32)
Calcium: 7.6 mg/dL — ABNORMAL LOW (ref 8.9–10.3)
Chloride: 118 mmol/L — ABNORMAL HIGH (ref 98–111)
Creatinine, Ser: 1.62 mg/dL — ABNORMAL HIGH (ref 0.61–1.24)
GFR, Estimated: 46 mL/min — ABNORMAL LOW (ref >60)
Glucose, Bld: 230 mg/dL — ABNORMAL HIGH (ref 70–99)
Phosphorus: 1.4 mg/dL — ABNORMAL LOW (ref 2.5–4.6)
Potassium: 3.1 mmol/L — ABNORMAL LOW (ref 3.5–5.1)
Sodium: 154 mmol/L — ABNORMAL HIGH (ref 135–145)

## 2024-02-10 LAB — CBC
HCT: 24.3 % — ABNORMAL LOW (ref 39.0–52.0)
Hemoglobin: 7.7 g/dL — ABNORMAL LOW (ref 13.0–17.0)
MCH: 28.1 pg (ref 26.0–34.0)
MCHC: 31.7 g/dL (ref 30.0–36.0)
MCV: 88.7 fL (ref 80.0–100.0)
Platelets: 381 10*3/uL (ref 150–400)
RBC: 2.74 MIL/uL — ABNORMAL LOW (ref 4.22–5.81)
RDW: 19.7 % — ABNORMAL HIGH (ref 11.5–15.5)
WBC: 23.3 10*3/uL — ABNORMAL HIGH (ref 4.0–10.5)
nRBC: 5.7 % — ABNORMAL HIGH (ref 0.0–0.2)

## 2024-02-10 LAB — BASIC METABOLIC PANEL WITH GFR
Anion gap: 12 (ref 5–15)
Anion gap: 11 (ref 5–15)
BUN: 61 mg/dL — ABNORMAL HIGH (ref 8–23)
BUN: 64 mg/dL — ABNORMAL HIGH (ref 8–23)
CO2: 25 mmol/L (ref 22–32)
CO2: 24 mmol/L (ref 22–32)
Calcium: 7.5 mg/dL — ABNORMAL LOW (ref 8.9–10.3)
Calcium: 7.9 mg/dL — ABNORMAL LOW (ref 8.9–10.3)
Chloride: 119 mmol/L — ABNORMAL HIGH (ref 98–111)
Chloride: 120 mmol/L — ABNORMAL HIGH (ref 98–111)
Creatinine, Ser: 1.58 mg/dL — ABNORMAL HIGH (ref 0.61–1.24)
Creatinine, Ser: 1.63 mg/dL — ABNORMAL HIGH (ref 0.61–1.24)
GFR, Estimated: 47 mL/min — ABNORMAL LOW (ref >60)
GFR, Estimated: 46 mL/min — ABNORMAL LOW (ref >60)
Glucose, Bld: 214 mg/dL — ABNORMAL HIGH (ref 70–99)
Glucose, Bld: 168 mg/dL — ABNORMAL HIGH (ref 70–99)
Potassium: 3 mmol/L — ABNORMAL LOW (ref 3.5–5.1)
Potassium: 4.1 mmol/L (ref 3.5–5.1)
Sodium: 156 mmol/L — ABNORMAL HIGH (ref 135–145)
Sodium: 155 mmol/L — ABNORMAL HIGH (ref 135–145)

## 2024-02-10 LAB — COOXEMETRY PANEL
Carboxyhemoglobin: 1.6 % — ABNORMAL HIGH (ref 0.5–1.5)
Methemoglobin: <0.7 % (ref 0.0–1.5)
O2 Saturation: 56.9 %
Total hemoglobin: 8.3 g/dL — ABNORMAL LOW (ref 12.0–16.0)

## 2024-02-10 LAB — MAGNESIUM: Magnesium: 1.7 mg/dL (ref 1.7–2.4)

## 2024-02-10 LAB — PHOSPHORUS: Phosphorus: 1.3 mg/dL — ABNORMAL LOW (ref 2.5–4.6)

## 2024-02-10 MED ORDER — CLONAZEPAM 1 MG PO TABS
1.0000 mg | ORAL_TABLET | Freq: Three times a day (TID) | ORAL | Status: DC
  Administered 2024-02-10 – 2024-02-16 (×19): 1 mg
  Filled 2024-02-10 (×19): qty 1

## 2024-02-10 MED ORDER — POTASSIUM CHLORIDE 20 MEQ PO PACK
40.0000 meq | PACK | ORAL | Status: AC
  Administered 2024-02-10 (×2): 40 meq
  Filled 2024-02-10 (×2): qty 2

## 2024-02-10 MED ORDER — MAGNESIUM SULFATE 4 GM/100ML IV SOLN
4.0000 g | Freq: Once | INTRAVENOUS | Status: AC
  Administered 2024-02-10: 4 g via INTRAVENOUS
  Filled 2024-02-10: qty 100

## 2024-02-10 MED ORDER — AMIODARONE HCL IN DEXTROSE 360-4.14 MG/200ML-% IV SOLN
30.0000 mg/h | INTRAVENOUS | Status: DC
  Administered 2024-02-11 – 2024-02-12 (×4): 30 mg/h via INTRAVENOUS
  Administered 2024-02-12: 60 mg/h via INTRAVENOUS
  Administered 2024-02-13 – 2024-02-20 (×14): 30 mg/h via INTRAVENOUS
  Filled 2024-02-10: qty 400
  Filled 2024-02-10 (×18): qty 200

## 2024-02-10 MED ORDER — FREE WATER
200.0000 mL | Status: DC
  Administered 2024-02-10 – 2024-02-15 (×31): 200 mL

## 2024-02-10 MED ORDER — POTASSIUM PHOSPHATES 15 MMOLE/5ML IV SOLN
30.0000 mmol | Freq: Once | INTRAVENOUS | Status: AC
  Administered 2024-02-10: 30 mmol via INTRAVENOUS
  Filled 2024-02-10: qty 10

## 2024-02-10 MED ORDER — AMIODARONE HCL 200 MG PO TABS: 400.0000 mg | ORAL_TABLET | Freq: Two times a day (BID) | ORAL | Status: DC

## 2024-02-10 MED ORDER — QUETIAPINE FUMARATE 50 MG PO TABS
50.0000 mg | ORAL_TABLET | Freq: Two times a day (BID) | ORAL | Status: DC
  Administered 2024-02-10 – 2024-02-11 (×3): 50 mg
  Filled 2024-02-10 (×4): qty 1

## 2024-02-10 MED ORDER — INSULIN ASPART 100 UNIT/ML IJ SOLN
0.0000 [IU] | INTRAMUSCULAR | Status: DC
  Administered 2024-02-10: 3 [IU] via SUBCUTANEOUS
  Administered 2024-02-10: 7 [IU] via SUBCUTANEOUS
  Administered 2024-02-10: 3 [IU] via SUBCUTANEOUS
  Administered 2024-02-10 – 2024-02-11 (×3): 4 [IU] via SUBCUTANEOUS
  Administered 2024-02-11: 3 [IU] via SUBCUTANEOUS
  Administered 2024-02-11: 4 [IU] via SUBCUTANEOUS
  Administered 2024-02-12: 7 [IU] via SUBCUTANEOUS
  Administered 2024-02-12 (×2): 4 [IU] via SUBCUTANEOUS
  Administered 2024-02-12: 7 [IU] via SUBCUTANEOUS
  Administered 2024-02-12: 4 [IU] via SUBCUTANEOUS
  Administered 2024-02-12: 7 [IU] via SUBCUTANEOUS
  Administered 2024-02-12: 1 [IU] via SUBCUTANEOUS
  Administered 2024-02-13 (×3): 7 [IU] via SUBCUTANEOUS
  Administered 2024-02-13: 4 [IU] via SUBCUTANEOUS
  Administered 2024-02-13: 7 [IU] via SUBCUTANEOUS
  Administered 2024-02-13: 4 [IU] via SUBCUTANEOUS
  Administered 2024-02-14: 7 [IU] via SUBCUTANEOUS
  Administered 2024-02-14: 11 [IU] via SUBCUTANEOUS
  Administered 2024-02-14 (×2): 4 [IU] via SUBCUTANEOUS
  Administered 2024-02-14: 7 [IU] via SUBCUTANEOUS
  Administered 2024-02-15 (×3): 4 [IU] via SUBCUTANEOUS
  Administered 2024-02-15 (×2): 7 [IU] via SUBCUTANEOUS
  Administered 2024-02-15 – 2024-02-16 (×2): 4 [IU] via SUBCUTANEOUS
  Administered 2024-02-16 (×3): 3 [IU] via SUBCUTANEOUS
  Administered 2024-02-16 (×2): 4 [IU] via SUBCUTANEOUS
  Administered 2024-02-17 (×5): 3 [IU] via SUBCUTANEOUS
  Administered 2024-02-18 (×4): 4 [IU] via SUBCUTANEOUS
  Administered 2024-02-18: 3 [IU] via SUBCUTANEOUS
  Administered 2024-02-18 – 2024-02-19 (×3): 4 [IU] via SUBCUTANEOUS
  Administered 2024-02-19: 11 [IU] via SUBCUTANEOUS
  Administered 2024-02-19 (×2): 4 [IU] via SUBCUTANEOUS
  Administered 2024-02-19: 3 [IU] via SUBCUTANEOUS
  Administered 2024-02-19 – 2024-02-20 (×2): 7 [IU] via SUBCUTANEOUS
  Administered 2024-02-20 (×2): 3 [IU] via SUBCUTANEOUS
  Administered 2024-02-20: 7 [IU] via SUBCUTANEOUS
  Administered 2024-02-20 – 2024-02-21 (×3): 3 [IU] via SUBCUTANEOUS
  Administered 2024-02-21 (×3): 4 [IU] via SUBCUTANEOUS
  Administered 2024-02-22 (×2): 3 [IU] via SUBCUTANEOUS
  Administered 2024-02-22: 4 [IU] via SUBCUTANEOUS
  Administered 2024-02-23 (×3): 3 [IU] via SUBCUTANEOUS
  Administered 2024-02-24 (×2): 4 [IU] via SUBCUTANEOUS
  Administered 2024-02-24: 3 [IU] via SUBCUTANEOUS
  Administered 2024-02-24 – 2024-02-25 (×2): 4 [IU] via SUBCUTANEOUS
  Administered 2024-02-25 (×3): 3 [IU] via SUBCUTANEOUS
  Administered 2024-02-25: 4 [IU] via SUBCUTANEOUS
  Administered 2024-02-26: 7 [IU] via SUBCUTANEOUS
  Administered 2024-02-26: 4 [IU] via SUBCUTANEOUS
  Administered 2024-02-26 (×5): 3 [IU] via SUBCUTANEOUS
  Administered 2024-02-27: 7 [IU] via SUBCUTANEOUS
  Administered 2024-02-27 (×2): 3 [IU] via SUBCUTANEOUS
  Administered 2024-02-27: 4 [IU] via SUBCUTANEOUS

## 2024-02-10 MED ORDER — AMIODARONE HCL IN DEXTROSE 360-4.14 MG/200ML-% IV SOLN
INTRAVENOUS | Status: AC
  Administered 2024-02-10: 30 mg/h via INTRAVENOUS
  Filled 2024-02-10: qty 200

## 2024-02-10 MED ORDER — POTASSIUM CHLORIDE 10 MEQ/50ML IV SOLN
10.0000 meq | INTRAVENOUS | Status: AC
  Administered 2024-02-10 (×3): 10 meq via INTRAVENOUS
  Filled 2024-02-10 (×3): qty 50

## 2024-02-10 MED ORDER — CLONAZEPAM 1 MG PO TABS: 1.0000 mg | ORAL_TABLET | Freq: Three times a day (TID) | ORAL | Status: DC

## 2024-02-10 NOTE — Progress Notes (Signed)
 9 Days Post-Op Procedure(s) (LRB): REPAIR OF ASCENDING AORTIC ANEURYSM USING A 32X12X10X10X10MM HEMASHIELD PLATINUM GRAFT. (N/A) ECHOCARDIOGRAM, TRANSESOPHAGEAL, INTRAOPERATIVE (N/A) BENTALL PROCEDURE USING A KONECT RESILIA AORTIC VALVE CONDUIT (N/A) Subjective:  Fever last evening to 101.3. 99.8 this am.   Hemodynamics have been stable on and off low dose NE up to 3 mcg for BP support. Co-ox 56.9 this am.  -1543 cc yesterday with diuretics. Recorded wts all over the place. I don't think he lost 15 lbs yesterday.  Objective: Vital signs in last 24 hours: Temp:  [99.1 F (37.3 C)-101.3 F (38.5 C)] 99.9 F (37.7 C) (04/24 0700) Pulse Rate:  [65-89] 72 (04/24 0700) Cardiac Rhythm: Normal sinus rhythm (04/24 0400) Resp:  [5-38] 23 (04/24 0700) BP: (78-129)/(38-67) 118/58 (04/24 0700) SpO2:  [89 %-100 %] 97 % (04/24 0700) Arterial Line BP: (93-170)/(41-64) 121/51 (04/24 0700) FiO2 (%):  [40 %] 40 % (04/24 0355) Weight:  [121 kg] 121 kg (04/24 0500)  Hemodynamic parameters for last 24 hours: CVP:  [0 mmHg-18 mmHg] 6 mmHg  Intake/Output from previous day: 04/23 0701 - 04/24 0700 In: 4227.5 [I.V.:1731.1; NG/GT:1730; IV Piggyback:766.4] Out: 5770 [Urine:5470; Stool:300] Intake/Output this shift: No intake/output data recorded.  General appearance: intubated and sedated on Precedex . Fentanyl  is off. Neurologic: moving all ext but not following commands. Heart: regular rate and rhythm, S1, S2 normal, no murmur Lungs: clear to auscultation bilaterally Abdomen: soft, non-tender; bowel sounds normal, obese Extremities: edema moderate Wound: incisions healing well  Lab Results: Recent Labs    02/09/24 0501 02/10/24 0414  WBC 18.2* 23.3*  HGB 8.3* 7.7*  HCT 26.9* 24.3*  PLT 304 381   BMET:  Recent Labs    02/09/24 1508 02/10/24 0414  NA 155* 156*  154*  K 3.4* 3.0*  3.1*  CL 117* 119*  118*  CO2 24 25  25   GLUCOSE 209* 214*  230*  BUN 62* 61*  61*   CREATININE 1.71* 1.58*  1.62*  CALCIUM  8.2* 7.5*  7.6*    PT/INR: No results for input(s): "LABPROT", "INR" in the last 72 hours. ABG    Component Value Date/Time   PHART 7.436 02/08/2024 0410   HCO3 27.9 02/08/2024 0410   TCO2 29 02/08/2024 0410   ACIDBASEDEF 4.0 (H) 02/02/2024 0859   O2SAT 56.9 02/10/2024 0416   CBG (last 3)  Recent Labs    02/09/24 2010 02/09/24 2356 02/10/24 0329  GLUCAP 133* 148* 160*    Assessment/Plan: S/P Procedure(s) (LRB): REPAIR OF ASCENDING AORTIC ANEURYSM USING A 32X12X10X10X10MM HEMASHIELD PLATINUM GRAFT. (N/A) ECHOCARDIOGRAM, TRANSESOPHAGEAL, INTRAOPERATIVE (N/A) BENTALL PROCEDURE USING A KONECT RESILIA AORTIC VALVE CONDUIT (N/A)  POD 9  Hemodynamics fairly stable on titrating NE for BP with good Co-ox, maintaining sinus rhythm on amio.   VDRF: Reintubated for hypoxemic respiratory failure. Bronch with copious secretions that he was not clearing. Culture of BAL normal flora.  CCM following and planning possible extubation today.  Encephalopathy: limiting vent wean and progression.    AKI: creat continues to improve. He needs continued diuresis. Excellent response to diuretic yesterday and creat remained stable.    Merrem  and vanc for possible pneumonia with leukocytosis increasing again and fever yesterday. Will repeat CXR this am and may need bronch.   Tolerating tube feeds at goal 60.   Hypokalemia: replete.  Hypernatremia: stable, follow. Getting free water .   LOS: 9 days    Dustin Davenport 02/10/2024

## 2024-02-10 NOTE — Plan of Care (Signed)
  Problem: Clinical Measurements: Goal: Respiratory complications will improve Outcome: Progressing Goal: Cardiovascular complication will be avoided Outcome: Progressing   Problem: Nutrition: Goal: Adequate nutrition will be maintained Outcome: Progressing   Problem: Pain Managment: Goal: General experience of comfort will improve and/or be controlled Outcome: Progressing   Problem: Cardiac: Goal: Ability to achieve and maintain adequate cardiovascular perfusion will improve Outcome: Progressing   Problem: Cardiac: Goal: Will achieve and/or maintain hemodynamic stability Outcome: Progressing   Problem: Respiratory: Goal: Respiratory status will improve Outcome: Progressing   Problem: Urinary Elimination: Goal: Ability to achieve and maintain adequate renal perfusion and functioning will improve Outcome: Progressing   Problem: Fluid Volume: Goal: Ability to maintain a balanced intake and output will improve Outcome: Progressing

## 2024-02-10 NOTE — H&P (View-Only) (Signed)
 Advanced Heart Failure Rounding Note  Cardiologist: None   Chief Complaint: STEMI> Cardiogenic shock> Now s/p aortic dissection repair + Bentall  Significant events:    - Echo 4/15: AAA measuring 5-5.7 cm. Followed by emergent TEE 4/15 which was significant for torrential aortic regurgitation and dissection flap extending from the aortic root to the proximal ascending aorta  - CT C/A/P: Showed acute type A dissection with a 5.5 cm ascending aortic aneurysm extending into arch. Dissection goes down to infrarenal aorta and extends up into innominate and left common carotid artery - 4/16 Underwent aortic dissection repair (Bentall/AVR and reimplantation of coronaries. Intra-op course c/b coagulopathy due to Effient  (PCI of RCA earlier in the day)).  - Extubated 4/18 - Re-intubated 4/19. Bronch with diffuse mucopurulent secretions  Subjective:    Off Epi, NE had been off now back on at 5. CO-OX 56.9% (off all pressors) with assumed Fick CI of 2.8 L/min/m2.  CVP 1.   5.4L UOP last 24 hrs with IV lasix  80 BID. Persistent hypernatremia and hyperchloremia.  Spiked a fever yesterday, abx switched from Zosyn  to vanc + meropenem .   Failed SBT this am.   Objective:    Weight Range: 121 kg Body mass index is 35.19 kg/m.   Vital Signs:   Temp:  [99.1 F (37.3 C)-101.3 F (38.5 C)] 99.5 F (37.5 C) (04/24 1000) Pulse Rate:  [65-89] 74 (04/24 1000) Resp:  [5-38] 31 (04/24 1000) BP: (78-129)/(38-67) 120/58 (04/24 1000) SpO2:  [89 %-100 %] 99 % (04/24 1000) Arterial Line BP: (93-170)/(41-64) 112/49 (04/24 1000) FiO2 (%):  [40 %] 40 % (04/24 0801) Weight:  [121 kg] 121 kg (04/24 0500) Last BM Date : 02/09/24  Weight change: Filed Weights   02/08/24 0500 02/09/24 0150 02/10/24 0500  Weight: 133.1 kg 127.7 kg 121 kg   Intake/Output:  Intake/Output Summary (Last 24 hours) at 02/10/2024 1043 Last data filed at 02/10/2024 1000 Gross per 24 hour  Intake 3644.64 ml  Output 4875 ml   Net -1230.36 ml    Physical Exam   General:  Critically ill appearing HEENT: + ETT/CorTrak Cor: Regular rate & rhythm. No rubs, gallops or murmurs. Lungs: coarse ventilated breath sounds Abdomen: obese, soft, distended.  Extremities: b/l upper and lower extremity edema, RUE PICC Neuro: Sedated   Telemetry   SR 70s-80s  Labs    CBC Recent Labs    02/09/24 0501 02/10/24 0414  WBC 18.2* 23.3*  HGB 8.3* 7.7*  HCT 26.9* 24.3*  MCV 90.3 88.7  PLT 304 381   Basic Metabolic Panel Recent Labs    16/10/96 0501 02/09/24 1508 02/10/24 0414  NA 153* 155* 156*  154*  K 2.8* 3.4* 3.0*  3.1*  CL 115* 117* 119*  118*  CO2 26 24 25  25   GLUCOSE 189* 209* 214*  230*  BUN 58* 62* 61*  61*  CREATININE 1.61* 1.71* 1.58*  1.62*  CALCIUM  8.1* 8.2* 7.5*  7.6*  MG 1.9  --  1.7  PHOS 3.0  --  1.3*  1.4*   Liver Function Tests Recent Labs    02/08/24 0043 02/08/24 0403 02/09/24 0501 02/10/24 0414  AST 162*  --  126*  --   ALT 74*  --  67*  --   ALKPHOS 117  --  131*  --   BILITOT 1.6*  --  1.6*  --   PROT 4.7*  --  5.0*  --   ALBUMIN  2.2*   < >  2.2* 1.9*   < > = values in this interval not displayed.   D-Dimer No results for input(s): "DDIMER" in the last 72 hours.  Hemoglobin A1C No results for input(s): "HGBA1C" in the last 72 hours.  Medications:    Scheduled Medications:  aspirin   81 mg Per Tube Daily   atorvastatin   80 mg Per Tube Daily   Chlorhexidine  Gluconate Cloth  6 each Topical Daily   clonazePAM   1 mg Per Tube Q8H   clopidogrel   75 mg Per Tube Daily   feeding supplement (PROSource TF20)  60 mL Per Tube TID   free water   200 mL Per Tube Q4H   insulin  aspart  0-20 Units Subcutaneous Q4H   methocarbamol  (ROBAXIN ) injection  500 mg Intravenous Q8H   metoCLOPramide  (REGLAN ) injection  5 mg Intravenous Q8H   mouth rinse  15 mL Mouth Rinse Q2H   pantoprazole  (PROTONIX ) IV  40 mg Intravenous QHS   potassium chloride   40 mEq Per Tube Q4H    QUEtiapine   50 mg Per Tube BID   senna-docusate  1 tablet Per Tube BID   sodium chloride  flush  10-40 mL Intracatheter Q12H   Infusions:  amiodarone  30 mg/hr (02/10/24 0700)   dexmedetomidine  (PRECEDEX ) IV infusion 1.5 mcg/kg/hr (02/10/24 0900)   epinephrine  Stopped (02/09/24 1656)   feeding supplement (VITAL 1.5 CAL) 60 mL/hr at 02/10/24 0700   fentaNYL  infusion INTRAVENOUS Stopped (02/10/24 8119)   meropenem  (MERREM ) IV Stopped (02/10/24 0440)   norepinephrine  (LEVOPHED ) Adult infusion Stopped (02/10/24 1478)   potassium PHOSPHATE IVPB (in mmol) 30 mmol (02/10/24 0824)   vancomycin  Stopped (02/09/24 1306)   PRN Medications: acetaminophen  (TYLENOL ) oral liquid 160 mg/5 mL, fentaNYL , midazolam , ondansetron  (ZOFRAN ) IV, mouth rinse, sodium chloride  flush  Patient Profile   Dustin Davenport is a 68 y.o. male with HTN and HLD. Admitted with STEMI now with CGS. Found to have AAA and taken emergently to the OR. Now s/p Bentall/AVR and reimplantation of coronaries.  Assessment/Plan   Acute type A aortic dissection  - Echo 4/15: AAA measuring 5-5.7 cm - Emergent TEE 4/15: significant for torrential aortic regurgitation and dissection flap extending from the aortic root to the proximal ascending aorta  - CT C/A/P: Showed acute type A dissection with a 5.5 cm ascending aortic aneurysm extending into arch. Dissection goes down to infrarenal aorta and extends up into innominate and left common carotid arteries  - Now s/p aortic dissection repair 02/01/24 (Bentall/AVR and reimplantation of coronaries.  2. CAD, Acute inferior STEMI - Admitted with STEMI. Post PCI found to also have aortic dissection - LHC with single vessel CAD involving the mid to distal RCA, successful PCI of the RCA with overlapping DES x 3. - Continue aspirin  81mg  daily - Continue plavix  75mg  daily - Continue atorvastatin  80mg  daily  3. Acute systolic HF ->  Cardiogenic shock - Post perfusion CGS.  - Echo 4/19: EF 25% RV  moderate to severely decreased - CO-OX lower at 57% (off NE and Epi) but Fick CI 2.8.  Now back on NE at 5. Wean as able. - CVP low. Diuresed well yesterday. Will hold off on IV lasix  today. Persistent hypernatremia and hyperchloremia. Free water  flushes have been increased.  4. Post-op hypoxic resp failure - extubated 4/18.  - re-intubated 4/19 - diffuse mucopurulent secretions on bronch 4/19. BAL GS: Mod WBC. Few gram variable rods. Rare yeast - Spiked a fever on 04/23. WBCs up, 18>23K. Zosyn  switched to Vanc + Meropenem . Repeat  respiratory culture pending. - Failed SBT this am  5. PAF with post-termination pauses PVCs - Patient with AF intra-op.  - Now back in NSR.  - Switch IV amiodarone  to po 400 mg BID  6. Hypokalemia - K 3.0, needs aggressive sup today  7. AKI due to ATN/shock - Baseline SCr ~1.3-1.4 - SCr peaked at 3.2, now 1.6, suspect ATN with hypotension  8. ABLA post-op - Received 6uPRBCs, 6 plt, 4 FFP and 5 cryo intra-op.   - Hgb 7.7 today. Follow. If continues to trend down will given 1 u RBCs  CRITICAL CARE Performed by: Gerilyn Kobus N   Total critical care time: 20 minutes  Critical care time was exclusive of separately billable procedures and treating other patients.  Critical care was necessary to treat or prevent imminent or life-threatening deterioration.  Critical care was time spent personally by me on the following activities: development of treatment plan with patient and/or surrogate as well as nursing, discussions with consultants, evaluation of patient's response to treatment, examination of patient, obtaining history from patient or surrogate, ordering and performing treatments and interventions, ordering and review of laboratory studies, ordering and review of radiographic studies, pulse oximetry and re-evaluation of patient's condition.      Length of Stay: 9  Nathalee Smarr N, PA-C  02/10/2024, 10:43 AM  Advanced Heart Failure Team Pager  [phone] (M-F; 7a - 5p)  Please contact [facility] for night-coverage after hours (5p -7a ) and weekends on [email or web address]

## 2024-02-10 NOTE — Progress Notes (Signed)
 NAMEJASSIEL FLYE, MRN:  [id number], DOB:  [birth year], LOS: 9 ADMISSION DATE:  02/01/2024, CONSULTATION DATE:  02/03/24 REFERRING MD:  Sherene Dilling, CHIEF COMPLAINT:  chest pain   History of Present Illness:  68 year old man w/ hx of HTN, obesity who presented on 4/15 with sudden onset chest pain.  EKG showing inferior STEMI and went to cath lab for RCA DES x 3.  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  Taken emergently to OR for Bentall+ ascending/arch replacement.  Procedure complicated by coagulopathy related to antiplatelet therapy from recent stent, multiple liters of blood products given.  Returned to ICU 4/16 on vent and PCCM consulted to assist with vent wean on 4/17.  Currently heavily sedated; attempts at weaning resulted in agitation without ability to redirect.  Pertinent  Medical History   Past Medical History:  Diagnosis Date   Hypertension      Significant Hospital Events: Including procedures, antibiotic start and stop dates in addition to other pertinent events   4/16 RCA stent then Bentall, aortic arch replacement 4/18 extubated 4/19 reintubated in afternoon for aspiration, bronch  Interim History / Subjective:  Patient came off of vasopressor support Coox dropped to 56 from 69 after stopping epinephrine  Making good amount of urine Spiked fever with Tmax 101.3 Did not tolerate dysfunctional bleeding dialysis yesterday and today due to tachypnea and increased work of breathing  Objective   Blood pressure (!) 118/58, pulse 72, temperature 99.9 F (37.7 C), resp. rate (!) 23, height 6\' 1"  (1.854 m), weight 121 kg, SpO2 97%. CVP:  [0 mmHg-18 mmHg] 6 mmHg  Vent Mode: PRVC FiO2 (%):  [40 %] 40 % Set Rate:  [20 bmp] 20 bmp Vt Set:  [560 mL] 560 mL PEEP:  [5 cmH20] 5 cmH20 Pressure Support:  [5 cmH20] 5 cmH20 Plateau Pressure:  [15 cmH20-24 cmH20] 24 cmH20   Intake/Output  Summary (Last 24 hours) at 02/10/2024 0749 Last data filed at 02/10/2024 0700 Gross per 24 hour  Intake 4227.45 ml  Output 5770 ml  Net -1542.55 ml   Filed Weights   02/08/24 0500 02/09/24 0150 02/10/24 0500  Weight: 133.1 kg 127.7 kg 121 kg    Examination: General: Crtitically ill-appearing elderly obese male, orally intubated HEENT: [place]/AT, eyes anicteric.  ETT and OGT in place Neuro: Open eyes with vocal stimuli, intermittently following simple commands Chest: Tachypneic, coarse breath sounds, no wheezes or rhonchi Heart: Regular rate and rhythm, no murmurs or gallops Abdomen: Soft, distended, bowel sounds present Skin: No rash  Cr ischemia 1 minute 1.5 Na 156 K 3 Phos 1.3 Mag 1.7 LFTs continue to trend down WBC 23.3 H/H 7.7/24 BAL: Gram variable rods, repeat cx pending Coox 56%  I/O net - 1.54 L in last 24 hours, with UO 5.4L Echo LVEF 35% with RV dilatation and reduced function Repeat CXR : Reviewed myself showing improved aeration bilaterally  Resolved Hospital Problem list   Constipation  Assessment & Plan:  Acute inferior wall STEMI status post PCI and DES 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 Acute respiratory failure with hypoxia and hypercapnia Bilateral multifocal pneumonia Shock combined cardiogenic/septic Acute kidney injury due to ischemic ATN from shock Hypokalemia/hyponatremia due to aggressive diuresis Shock liver, improving Acute encephalopathy, likely septic Perioperative acute blood loss anemia and coagulopathy Obesity  Continue aspirin , Plavix  and atorvastatin  TCTS following Continue lung protective ventilation  Patient has increased work of breathing, he is failing spontaneous breathing trial He is tachypneic Off fentanyl , Precedex  had to be increased to 1.5 mics per KG Received as needed fentanyl  and Versed  VAP prevention bundle in place Spiked fever yesterday, antibiotics were switched  to meropenem  with vancomycin  from Zosyn  Initial respiratory culture grew gram variable rods Repeat respiratory culture White count is trending up now Off vasopressor support Serum creatinine continues to trend down, currently at 1.5 Avoid nephrotoxic agent Continue aggressive diuresis Continue aggressive potassium replacement, currently at level 3 Serum sodium went up Increase free water  flushes to 200 every 4h LFTs are improving Hemoglobin is stable between 7-8 He is having loose bowel movement, Flexi-Seal in place Diet and exercise counseling as appropriate   Best Practice (right click and "Reselect all SmartList Selections" daily)   Diet/type: Tube feeds DVT prophylaxis per TCTS Pressure ulcer(s): N/A GI prophylaxis: PPI Lines: Central line Foley:  Yes, and it is still needed Code Status:  full code Last date of multidisciplinary goals of care discussion [updated at bedside]  The patient is critically ill due to acute respiratory failure with hypoxia and hypercapnia/septic shock. critical care was necessary to treat or prevent imminent or life-threatening deterioration.  Critical care was time spent personally by me on the following activities: development of treatment plan with patient and/or surrogate as well as nursing, discussions with consultants, evaluation of patient's response to treatment, examination of patient, obtaining history from patient or surrogate, ordering and performing treatments and interventions, ordering and review of laboratory studies, ordering and review of radiographic studies, pulse oximetry, re-evaluation of patient's condition and participation in multidisciplinary rounds.   During this encounter critical care time was devoted to patient care services described in this note for 36 minutes.     Trevor Fudge, MD [facility] See Amion for pager If no response to pager, please call [phone] until 7pm After 7pm, Please call E-link  [phone]

## 2024-02-10 NOTE — Progress Notes (Signed)
 Advanced Heart Failure Rounding Note  Cardiologist: None   Chief Complaint: STEMI> Cardiogenic shock> Now s/p aortic dissection repair + Bentall  Significant events:    - Echo 4/15: AAA measuring 5-5.7 cm. Followed by emergent TEE 4/15 which was significant for torrential aortic regurgitation and dissection flap extending from the aortic root to the proximal ascending aorta  - CT C/A/P: Showed acute type A dissection with a 5.5 cm ascending aortic aneurysm extending into arch. Dissection goes down to infrarenal aorta and extends up into innominate and left common carotid artery - 4/16 Underwent aortic dissection repair (Bentall/AVR and reimplantation of coronaries. Intra-op course c/b coagulopathy due to Effient  (PCI of RCA earlier in the day)).  - Extubated 4/18 - Re-intubated 4/19. Bronch with diffuse mucopurulent secretions  Subjective:    Off Epi, NE had been off now back on at 5. CO-OX 56.9% (off all pressors) with assumed Fick CI of 2.8 L/min/m2.  CVP 1.   5.4L UOP last 24 hrs with IV lasix  80 BID. Persistent hypernatremia and hyperchloremia.  Spiked a fever yesterday, abx switched from Zosyn  to vanc + meropenem .   Failed SBT this am.   Objective:    Weight Range: 121 kg Body mass index is 35.19 kg/m.   Vital Signs:   Temp:  [99.1 F (37.3 C)-101.3 F (38.5 C)] 99.5 F (37.5 C) (04/24 1000) Pulse Rate:  [65-89] 74 (04/24 1000) Resp:  [5-38] 31 (04/24 1000) BP: (78-129)/(38-67) 120/58 (04/24 1000) SpO2:  [89 %-100 %] 99 % (04/24 1000) Arterial Line BP: (93-170)/(41-64) 112/49 (04/24 1000) FiO2 (%):  [40 %] 40 % (04/24 0801) Weight:  [121 kg] 121 kg (04/24 0500) Last BM Date : 02/09/24  Weight change: Filed Weights   02/08/24 0500 02/09/24 0150 02/10/24 0500  Weight: 133.1 kg 127.7 kg 121 kg   Intake/Output:  Intake/Output Summary (Last 24 hours) at 02/10/2024 1043 Last data filed at 02/10/2024 1000 Gross per 24 hour  Intake 3644.64 ml  Output 4875 ml   Net -1230.36 ml    Physical Exam   General:  Critically ill appearing HEENT: + ETT/CorTrak Cor: Regular rate & rhythm. No rubs, gallops or murmurs. Lungs: coarse ventilated breath sounds Abdomen: obese, soft, distended.  Extremities: b/l upper and lower extremity edema, RUE PICC Neuro: Sedated   Telemetry   SR 70s-80s  Labs    CBC Recent Labs    02/09/24 0501 02/10/24 0414  WBC 18.2* 23.3*  HGB 8.3* 7.7*  HCT 26.9* 24.3*  MCV 90.3 88.7  PLT 304 381   Basic Metabolic Panel Recent Labs    16/10/96 0501 02/09/24 1508 02/10/24 0414  NA 153* 155* 156*  154*  K 2.8* 3.4* 3.0*  3.1*  CL 115* 117* 119*  118*  CO2 26 24 25  25   GLUCOSE 189* 209* 214*  230*  BUN 58* 62* 61*  61*  CREATININE 1.61* 1.71* 1.58*  1.62*  CALCIUM  8.1* 8.2* 7.5*  7.6*  MG 1.9  --  1.7  PHOS 3.0  --  1.3*  1.4*   Liver Function Tests Recent Labs    02/08/24 0043 02/08/24 0403 02/09/24 0501 02/10/24 0414  AST 162*  --  126*  --   ALT 74*  --  67*  --   ALKPHOS 117  --  131*  --   BILITOT 1.6*  --  1.6*  --   PROT 4.7*  --  5.0*  --   ALBUMIN  2.2*   < >  2.2* 1.9*   < > = values in this interval not displayed.   D-Dimer No results for input(s): "DDIMER" in the last 72 hours.  Hemoglobin A1C No results for input(s): "HGBA1C" in the last 72 hours.  Medications:    Scheduled Medications:  aspirin   81 mg Per Tube Daily   atorvastatin   80 mg Per Tube Daily   Chlorhexidine  Gluconate Cloth  6 each Topical Daily   clonazePAM   1 mg Per Tube Q8H   clopidogrel   75 mg Per Tube Daily   feeding supplement (PROSource TF20)  60 mL Per Tube TID   free water   200 mL Per Tube Q4H   insulin  aspart  0-20 Units Subcutaneous Q4H   methocarbamol  (ROBAXIN ) injection  500 mg Intravenous Q8H   metoCLOPramide  (REGLAN ) injection  5 mg Intravenous Q8H   mouth rinse  15 mL Mouth Rinse Q2H   pantoprazole  (PROTONIX ) IV  40 mg Intravenous QHS   potassium chloride   40 mEq Per Tube Q4H    QUEtiapine   50 mg Per Tube BID   senna-docusate  1 tablet Per Tube BID   sodium chloride  flush  10-40 mL Intracatheter Q12H   Infusions:  amiodarone  30 mg/hr (02/10/24 0700)   dexmedetomidine  (PRECEDEX ) IV infusion 1.5 mcg/kg/hr (02/10/24 0900)   epinephrine  Stopped (02/09/24 1656)   feeding supplement (VITAL 1.5 CAL) 60 mL/hr at 02/10/24 0700   fentaNYL  infusion INTRAVENOUS Stopped (02/10/24 8119)   meropenem  (MERREM ) IV Stopped (02/10/24 0440)   norepinephrine  (LEVOPHED ) Adult infusion Stopped (02/10/24 1478)   potassium PHOSPHATE IVPB (in mmol) 30 mmol (02/10/24 0824)   vancomycin  Stopped (02/09/24 1306)   PRN Medications: acetaminophen  (TYLENOL ) oral liquid 160 mg/5 mL, fentaNYL , midazolam , ondansetron  (ZOFRAN ) IV, mouth rinse, sodium chloride  flush  Patient Profile   Dustin Davenport is a 68 y.o. male with HTN and HLD. Admitted with STEMI now with CGS. Found to have AAA and taken emergently to the OR. Now s/p Bentall/AVR and reimplantation of coronaries.  Assessment/Plan   Acute type A aortic dissection  - Echo 4/15: AAA measuring 5-5.7 cm - Emergent TEE 4/15: significant for torrential aortic regurgitation and dissection flap extending from the aortic root to the proximal ascending aorta  - CT C/A/P: Showed acute type A dissection with a 5.5 cm ascending aortic aneurysm extending into arch. Dissection goes down to infrarenal aorta and extends up into innominate and left common carotid arteries  - Now s/p aortic dissection repair 02/01/24 (Bentall/AVR and reimplantation of coronaries.  2. CAD, Acute inferior STEMI - Admitted with STEMI. Post PCI found to also have aortic dissection - LHC with single vessel CAD involving the mid to distal RCA, successful PCI of the RCA with overlapping DES x 3. - Continue aspirin  81mg  daily - Continue plavix  75mg  daily - Continue atorvastatin  80mg  daily  3. Acute systolic HF ->  Cardiogenic shock - Post perfusion CGS.  - Echo 4/19: EF 25% RV  moderate to severely decreased - CO-OX lower at 57% (off NE and Epi) but Fick CI 2.8.  Now back on NE at 5. Wean as able. - CVP low. Diuresed well yesterday. Will hold off on IV lasix  today. Persistent hypernatremia and hyperchloremia. Free water  flushes have been increased.  4. Post-op hypoxic resp failure - extubated 4/18.  - re-intubated 4/19 - diffuse mucopurulent secretions on bronch 4/19. BAL GS: Mod WBC. Few gram variable rods. Rare yeast - Spiked a fever on 04/23. WBCs up, 18>23K. Zosyn  switched to Vanc + Meropenem . Repeat  respiratory culture pending. - Failed SBT this am  5. PAF with post-termination pauses PVCs - Patient with AF intra-op.  - Now back in NSR.  - Switch IV amiodarone  to po 400 mg BID  6. Hypokalemia - K 3.0, needs aggressive sup today  7. AKI due to ATN/shock - Baseline SCr ~1.3-1.4 - SCr peaked at 3.2, now 1.6, suspect ATN with hypotension  8. ABLA post-op - Received 6uPRBCs, 6 plt, 4 FFP and 5 cryo intra-op.   - Hgb 7.7 today. Follow. If continues to trend down will given 1 u RBCs  CRITICAL CARE Performed by: Gerilyn Kobus N   Total critical care time: 20 minutes  Critical care time was exclusive of separately billable procedures and treating other patients.  Critical care was necessary to treat or prevent imminent or life-threatening deterioration.  Critical care was time spent personally by me on the following activities: development of treatment plan with patient and/or surrogate as well as nursing, discussions with consultants, evaluation of patient's response to treatment, examination of patient, obtaining history from patient or surrogate, ordering and performing treatments and interventions, ordering and review of laboratory studies, ordering and review of radiographic studies, pulse oximetry and re-evaluation of patient's condition.      Length of Stay: 9  Nathalee Smarr N, PA-C  02/10/2024, 10:43 AM  Advanced Heart Failure Team Pager  [phone] (M-F; 7a - 5p)  Please contact [facility] for night-coverage after hours (5p -7a ) and weekends on [email or web address]

## 2024-02-11 ENCOUNTER — Encounter (HOSPITAL_COMMUNITY): Admission: EM | Disposition: E | Payer: Self-pay | Source: Home / Self Care | Attending: Surgery

## 2024-02-11 DIAGNOSIS — E870 Hyperosmolality and hypernatremia: Secondary | ICD-10-CM

## 2024-02-11 DIAGNOSIS — J189 Pneumonia, unspecified organism: Secondary | ICD-10-CM | POA: Diagnosis not present

## 2024-02-11 DIAGNOSIS — I480 Paroxysmal atrial fibrillation: Secondary | ICD-10-CM | POA: Diagnosis not present

## 2024-02-11 DIAGNOSIS — J9601 Acute respiratory failure with hypoxia: Secondary | ICD-10-CM | POA: Diagnosis not present

## 2024-02-11 DIAGNOSIS — R570 Cardiogenic shock: Secondary | ICD-10-CM | POA: Diagnosis not present

## 2024-02-11 DIAGNOSIS — J9602 Acute respiratory failure with hypercapnia: Secondary | ICD-10-CM | POA: Diagnosis not present

## 2024-02-11 DIAGNOSIS — I5021 Acute systolic (congestive) heart failure: Secondary | ICD-10-CM | POA: Diagnosis not present

## 2024-02-11 HISTORY — PX: RIGHT HEART CATH: CATH118263

## 2024-02-11 LAB — POCT I-STAT EG7
Acid-Base Excess: 2 mmol/L (ref 0.0–2.0)
Bicarbonate: 26.7 mmol/L (ref 20.0–28.0)
Calcium, Ion: 1.2 mmol/L (ref 1.15–1.40)
HCT: 24 % — ABNORMAL LOW (ref 39.0–52.0)
Hemoglobin: 8.2 g/dL — ABNORMAL LOW (ref 13.0–17.0)
O2 Saturation: 59 %
Potassium: 4 mmol/L (ref 3.5–5.1)
Sodium: 157 mmol/L — ABNORMAL HIGH (ref 135–145)
TCO2: 28 mmol/L (ref 22–32)
pCO2, Ven: 44.4 mmHg (ref 44–60)
pH, Ven: 7.388 (ref 7.25–7.43)
pO2, Ven: 31 mmHg — CL (ref 32–45)

## 2024-02-11 LAB — RENAL FUNCTION PANEL
Albumin: 1.7 g/dL — ABNORMAL LOW (ref 3.5–5.0)
Anion gap: 10 (ref 5–15)
BUN: 60 mg/dL — ABNORMAL HIGH (ref 8–23)
CO2: 24 mmol/L (ref 22–32)
Calcium: 7.2 mg/dL — ABNORMAL LOW (ref 8.9–10.3)
Chloride: 118 mmol/L — ABNORMAL HIGH (ref 98–111)
Creatinine, Ser: 1.38 mg/dL — ABNORMAL HIGH (ref 0.61–1.24)
GFR, Estimated: 56 mL/min — ABNORMAL LOW (ref >60)
Glucose, Bld: 279 mg/dL — ABNORMAL HIGH (ref 70–99)
Phosphorus: 2.2 mg/dL — ABNORMAL LOW (ref 2.5–4.6)
Potassium: 3.4 mmol/L — ABNORMAL LOW (ref 3.5–5.1)
Sodium: 152 mmol/L — ABNORMAL HIGH (ref 135–145)

## 2024-02-11 LAB — CBC
HCT: 22.7 % — ABNORMAL LOW (ref 39.0–52.0)
Hemoglobin: 7.1 g/dL — ABNORMAL LOW (ref 13.0–17.0)
MCH: 27.8 pg (ref 26.0–34.0)
MCHC: 31.3 g/dL (ref 30.0–36.0)
MCV: 89 fL (ref 80.0–100.0)
Platelets: 406 10*3/uL — ABNORMAL HIGH (ref 150–400)
RBC: 2.55 MIL/uL — ABNORMAL LOW (ref 4.22–5.81)
RDW: 19.8 % — ABNORMAL HIGH (ref 11.5–15.5)
WBC: 20.2 10*3/uL — ABNORMAL HIGH (ref 4.0–10.5)
nRBC: 3.6 % — ABNORMAL HIGH (ref 0.0–0.2)

## 2024-02-11 LAB — GLUCOSE, CAPILLARY
Glucose-Capillary: 145 mg/dL — ABNORMAL HIGH (ref 70–99)
Glucose-Capillary: 153 mg/dL — ABNORMAL HIGH (ref 70–99)
Glucose-Capillary: 153 mg/dL — ABNORMAL HIGH (ref 70–99)
Glucose-Capillary: 158 mg/dL — ABNORMAL HIGH (ref 70–99)
Glucose-Capillary: 166 mg/dL — ABNORMAL HIGH (ref 70–99)
Glucose-Capillary: 187 mg/dL — ABNORMAL HIGH (ref 70–99)

## 2024-02-11 LAB — COOXEMETRY PANEL
Carboxyhemoglobin: 1.6 % — ABNORMAL HIGH (ref 0.5–1.5)
Methemoglobin: <0.7 % (ref 0.0–1.5)
O2 Saturation: 68.9 %
Total hemoglobin: 7.5 g/dL — ABNORMAL LOW (ref 12.0–16.0)

## 2024-02-11 LAB — MAGNESIUM: Magnesium: 2.2 mg/dL (ref 1.7–2.4)

## 2024-02-11 SURGERY — RIGHT HEART CATH
Anesthesia: LOCAL

## 2024-02-11 MED ORDER — SODIUM CHLORIDE 0.9 % IV SOLN: INTRAVENOUS | Status: DC

## 2024-02-11 MED ORDER — POTASSIUM CHLORIDE 20 MEQ PO PACK
40.0000 meq | PACK | ORAL | Status: AC
  Administered 2024-02-11 (×2): 40 meq
  Filled 2024-02-11 (×2): qty 2

## 2024-02-11 MED ORDER — MAGNESIUM SULFATE 2 GM/50ML IV SOLN: 2.0000 g | Freq: Once | INTRAVENOUS | Status: DC

## 2024-02-11 MED ORDER — LIDOCAINE HCL (PF) 1 % IJ SOLN
INTRAMUSCULAR | Status: DC | PRN
  Administered 2024-02-11: 5 mL

## 2024-02-11 MED ORDER — VANCOMYCIN HCL 1750 MG/350ML IV SOLN
1750.0000 mg | Freq: Once | INTRAVENOUS | Status: AC
  Administered 2024-02-11: 1750 mg via INTRAVENOUS
  Filled 2024-02-11: qty 350

## 2024-02-11 MED ORDER — ASPIRIN 81 MG PO CHEW
81.0000 mg | CHEWABLE_TABLET | Freq: Every day | ORAL | Status: DC
  Administered 2024-02-11 – 2024-02-12 (×2): 81 mg
  Filled 2024-02-11: qty 1

## 2024-02-11 MED ORDER — ALBUTEROL SULFATE (2.5 MG/3ML) 0.083% IN NEBU
2.5000 mg | INHALATION_SOLUTION | Freq: Four times a day (QID) | RESPIRATORY_TRACT | Status: DC | PRN
  Administered 2024-02-11 – 2024-02-15 (×2): 2.5 mg via RESPIRATORY_TRACT
  Filled 2024-02-11 (×2): qty 3

## 2024-02-11 MED ORDER — HEPARIN (PORCINE) IN NACL 1000-0.9 UT/500ML-% IV SOLN
INTRAVENOUS | Status: DC | PRN
  Administered 2024-02-11: 500 mL via SURGICAL_CAVITY

## 2024-02-11 MED ORDER — LIDOCAINE HCL (PF) 1 % IJ SOLN
INTRAMUSCULAR | Status: AC
  Filled 2024-02-11: qty 30

## 2024-02-11 MED ORDER — PROPOFOL 1000 MG/100ML IV EMUL
0.0000 ug/kg/min | INTRAVENOUS | Status: DC
Start: 2024-02-11 — End: 2024-02-19
  Administered 2024-02-11: 20 ug/kg/min via INTRAVENOUS
  Administered 2024-02-11: 5 ug/kg/min via INTRAVENOUS
  Administered 2024-02-12 (×4): 20 ug/kg/min via INTRAVENOUS
  Administered 2024-02-13: 10 ug/kg/min via INTRAVENOUS
  Administered 2024-02-13: 20 ug/kg/min via INTRAVENOUS
  Administered 2024-02-13: 15 ug/kg/min via INTRAVENOUS
  Administered 2024-02-13: 19.978 ug/kg/min via INTRAVENOUS
  Administered 2024-02-14 (×4): 20 ug/kg/min via INTRAVENOUS
  Filled 2024-02-11 (×16): qty 100

## 2024-02-11 MED ORDER — CLOPIDOGREL BISULFATE 75 MG PO TABS
75.0000 mg | ORAL_TABLET | Freq: Every day | ORAL | Status: DC
  Administered 2024-02-11 – 2024-02-17 (×7): 75 mg
  Filled 2024-02-11 (×6): qty 1

## 2024-02-11 MED ORDER — METOLAZONE 5 MG PO TABS
5.0000 mg | ORAL_TABLET | Freq: Once | ORAL | Status: AC
Start: 2024-02-11 — End: 2024-02-11
  Administered 2024-02-11: 5 mg via ORAL
  Filled 2024-02-11: qty 1

## 2024-02-11 MED ORDER — ATORVASTATIN CALCIUM 80 MG PO TABS
80.0000 mg | ORAL_TABLET | Freq: Every day | ORAL | Status: DC
  Administered 2024-02-11: 80 mg
  Filled 2024-02-11: qty 1

## 2024-02-11 MED ORDER — POTASSIUM PHOSPHATES 15 MMOLE/5ML IV SOLN
30.0000 mmol | Freq: Once | INTRAVENOUS | Status: AC
  Administered 2024-02-11: 30 mmol via INTRAVENOUS
  Filled 2024-02-11 (×2): qty 10

## 2024-02-11 SURGICAL SUPPLY — 9 items
CATH SWAN GANZ 7F STRAIGHT (CATHETERS) IMPLANT
KIT MICROPUNCTURE NIT STIFF (SHEATH) IMPLANT
MAT PREVALON FULL STRYKER (MISCELLANEOUS) IMPLANT
PACK CARDIAC CATHETERIZATION (CUSTOM PROCEDURE TRAY) ×1 IMPLANT
SHEATH PINNACLE 7F 10CM (SHEATH) IMPLANT
SHEATH PROBE COVER 6X72 (BAG) IMPLANT
SHIELD CATH-GARD CONTAMINATION (MISCELLANEOUS) IMPLANT
TRANSDUCER W/STOPCOCK (MISCELLANEOUS) IMPLANT
TUBING ART PRESS 72 MALE/FEM (TUBING) IMPLANT

## 2024-02-11 NOTE — Progress Notes (Signed)
 Patient was transported toe Cath Lab 5 @ [phone] and back to 2hH24 @ 0942 via the ventilator with no complications. Ventilator plugged into red outlet and oxygen and air hoses connected to wall.  Patient FiO2 returned to previous setting before transport.

## 2024-02-11 NOTE — Interval H&P Note (Signed)
 History and Physical Interval Note:  02/11/2024 9:05 AM  Dustin Davenport  has presented today for surgery, with the diagnosis of heart failure.  The various methods of treatment have been discussed with the patient and family. After consideration of risks, benefits and other options for treatment, the patient has consented to  Procedure(s): RIGHT HEART CATH (N/A) as a surgical intervention.  The patient's history has been reviewed, patient examined, no change in status, stable for surgery.  I have reviewed the patient's chart and labs.  Questions were answered to the patient's satisfaction.     [name]

## 2024-02-11 NOTE — Progress Notes (Signed)
 Dustin Davenport, MRN:  [id number], DOB:  1956/01/09, LOS: 10 ADMISSION DATE:  02/01/2024, CONSULTATION DATE:  02/03/24 REFERRING MD:  Sherene Dilling, CHIEF COMPLAINT:  chest pain   History of Present Illness:  68 year old man w/ hx of HTN, obesity who presented on 4/15 with sudden onset chest pain.  EKG showing inferior STEMI and went to cath lab for RCA DES x 3.  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  Taken emergently to OR for Bentall+ ascending/arch replacement.  Procedure complicated by coagulopathy related to antiplatelet therapy from recent stent, multiple liters of blood products given.  Returned to ICU 4/16 on vent and PCCM consulted to assist with vent wean on 4/17.  Currently heavily sedated; attempts at weaning resulted in agitation without ability to redirect.  Pertinent  Medical History   Past Medical History:  Diagnosis Date   Hypertension      Significant Hospital Events: Including procedures, antibiotic start and stop dates in addition to other pertinent events   4/16 RCA stent then Bentall, aortic arch replacement 4/18 extubated 4/19 reintubated in afternoon for aspiration, bronch  Interim History / Subjective:  Patient spiked low-grade fever with Tmax 100.8, now afebrile Came off of vasopressors Coox  Improved to 69 Went into A-fib, requiring amiodarone  but that became bradycardic Did not tolerate a spontaneous breathing trial due to tachypnea and increased work of breathing  Objective   Blood pressure (!) 120/59, pulse (!) 49, temperature 99 F (37.2 C), resp. rate (!) 24, height 6\' 1"  (1.854 m), weight 119.3 kg, SpO2 97%. CVP:  [0 mmHg-27 mmHg] 9 mmHg  Vent Mode: PRVC FiO2 (%):  [40 %] 40 % Set Rate:  [20 bmp] 20 bmp Vt Set:  [560 mL] 560 mL PEEP:  [5 cmH20] 5 cmH20 Plateau Pressure:  [22 cmH20-27 cmH20] 27 cmH20   Intake/Output Summary (Last 24 hours) at 02/11/2024  0743 Last data filed at 02/11/2024 0700 Gross per 24 hour  Intake 4590.26 ml  Output 2770 ml  Net 1820.26 ml   Filed Weights   02/09/24 0150 02/10/24 0500 02/11/24 0500  Weight: 127.7 kg 121 kg 119.3 kg    Examination: General: Crtitically ill-appearing male, orally intubated HEENT: [place]/AT, eyes anicteric.  ETT and OGT in place Neuro: Sedated, not following commands.  Eyes are closed.  Pupils 3 mm bilateral reactive to light Chest: Bilateral rhonchorous breath sounds, no wheezes or crackles Heart: Irregularly irregular, no murmurs or gallops Abdomen: Soft, nondistended, bowel sounds present Skin: No rash  Cr 1.3 Na 152 K 3.4 Phos 2.2 Mag 1.7 WBC 20 H/H 7.1/22 BAL: Gram variable rods Coox 69%  I/O net +1.8 L in last 24 hours, with UO 2.7L Echo LVEF 35% with RV dilatation and reduced function Repeat CXR : Reviewed myself showing improved aeration bilaterally  Resolved Hospital Problem list   Constipation  Assessment & Plan:  Acute inferior wall STEMI status post PCI and DES 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 Paroxysmal A-fib with RVR Acute respiratory failure with hypoxia and hypercapnia Bilateral multifocal pneumonia Shock combined cardiogenic/septic Acute kidney injury due to ischemic ATN from shock Hypokalemia/hypernatremia due to aggressive diuresis Hypophosphatemia  Shock liver, improving Acute encephalopathy, likely septic Perioperative acute blood loss anemia and coagulopathy Obesity  Continue aspirin , Plavix  and atorvastatin  TCTS following Continue lung protective ventilation Patient failed spontaneous breathing trial again this morning due  to tachypnea Overnight went into A-fib with RVR, requiring amiodarone  This morning during spontaneous breathing trial he became bradycardic into 30s which improved spontaneously Continue amiodarone  Continue IV antibiotics with vancomycin  and meropenem  Repeat  respiratory culture is negative He spiked fever overnight with Tmax 100.8 could be due to Precedex  but his white count was elevated and now it is trending down Will try to switch Precedex  to low-dose propofol  once he comes back from Cath Lab after right heart cath Came off of vasopressor support Serum creatinine continue to improve Continue aggressive electrolyte replacement Continue free water  flushes 200 every 4 Requiring sedation due to agitation Monitor H&H   Best Practice (right click and "Reselect all SmartList Selections" daily)   Diet/type: Tube feeds DVT prophylaxis per TCTS Pressure ulcer(s): N/A GI prophylaxis: PPI Lines: Central line Foley:  Yes, and it is still needed Code Status:  full code Last date of multidisciplinary goals of care discussion [updated at bedside]  The patient is critically ill due to acute respiratory failure with hypoxia and hypercapnia/septic shock. critical care was necessary to treat or prevent imminent or life-threatening deterioration.  Critical care was time spent personally by me on the following activities: development of treatment plan with patient and/or surrogate as well as nursing, discussions with consultants, evaluation of patient's response to treatment, examination of patient, obtaining history from patient or surrogate, ordering and performing treatments and interventions, ordering and review of laboratory studies, ordering and review of radiographic studies, pulse oximetry, re-evaluation of patient's condition and participation in multidisciplinary rounds.   During this encounter critical care time was devoted to patient care services described in this note for 37 minutes.     Trevor Fudge, MD [facility] See Amion for pager If no response to pager, please call [phone] until 7pm After 7pm, Please call E-link [phone]

## 2024-02-11 NOTE — Progress Notes (Signed)
 10 Days Post-Op Procedure(s) (LRB): REPAIR OF ASCENDING AORTIC ANEURYSM USING A 32X12X10X10X10MM HEMASHIELD PLATINUM GRAFT. (N/A) ECHOCARDIOGRAM, TRANSESOPHAGEAL, INTRAOPERATIVE (N/A) BENTALL PROCEDURE USING A KONECT RESILIA AORTIC VALVE CONDUIT (N/A) Subjective:  Tmax 100.8 yesterday. 99 this am.  Went into atrial fib overnight with controlled rate down to 50's. Looks back in sinus 70's this am on IV amio 30.  Hemodynamics stable on NE 4 to support BP. CVP 9-10. Co-ox 69.  +1800 cc yesterday. Wt recorded at 263 which is below preop?  Objective: Vital signs in last 24 hours: Temp:  [99 F (37.2 C)-100.8 F (38.2 C)] 99 F (37.2 C) (04/25 0700) Pulse Rate:  [46-116] 49 (04/25 0700) Cardiac Rhythm: Atrial fibrillation (04/25 0542) Resp:  [8-37] 24 (04/25 0700) BP: (95-135)/(37-78) 120/59 (04/25 0700) SpO2:  [80 %-100 %] 97 % (04/25 0700) Arterial Line BP: (89-156)/(38-67) 129/49 (04/25 0700) FiO2 (%):  [40 %] 40 % (04/25 0317) Weight:  [119.3 kg] 119.3 kg (04/25 0500)  Hemodynamic parameters for last 24 hours: CVP:  [0 mmHg-27 mmHg] 9 mmHg  Intake/Output from previous day: 04/24 0701 - 04/25 0700 In: 4590.3 [I.V.:1995.9; NG/GT:1230; IV Piggyback:1364.4] Out: 2770 [Urine:2770] Intake/Output this shift: No intake/output data recorded.  General appearance: sedated on vent. Neurologic: unable to assess. Heart: regular rate and rhythm, S1, S2 normal, no murmur Lungs: clear to auscultation bilaterally Abdomen: soft, non-tender; bowel sounds norma Extremities: moderate edema Wound: incisions looks fine. Healing well  Lab Results: Recent Labs    02/10/24 0414 02/11/24 0445  WBC 23.3* 20.2*  HGB 7.7* 7.1*  HCT 24.3* 22.7*  PLT 381 406*   BMET:  Recent Labs    02/10/24 1422 02/11/24 0445  NA 155* 152*  K 4.1 3.4*  CL 120* 118*  CO2 24 24  GLUCOSE 168* 279*  BUN 64* 60*  CREATININE 1.63* 1.38*  CALCIUM  7.9* 7.2*    PT/INR: No results for input(s):  "LABPROT", "INR" in the last 72 hours. ABG    Component Value Date/Time   PHART 7.436 02/08/2024 0410   HCO3 27.9 02/08/2024 0410   TCO2 29 02/08/2024 0410   ACIDBASEDEF 4.0 (H) 02/02/2024 0859   O2SAT 68.9 02/11/2024 0445   CBG (last 3)  Recent Labs    02/10/24 2016 02/10/24 2345 02/11/24 0413  GLUCAP 133* 142* 187*    Assessment/Plan: S/P Procedure(s) (LRB): REPAIR OF ASCENDING AORTIC ANEURYSM USING A 32X12X10X10X10MM HEMASHIELD PLATINUM GRAFT. (N/A) ECHOCARDIOGRAM, TRANSESOPHAGEAL, INTRAOPERATIVE (N/A) BENTALL PROCEDURE USING A KONECT RESILIA AORTIC VALVE CONDUIT (N/A)  POD 10   Hemodynamics fairly stable on titrating NE for BP with good Co-ox, maintaining sinus rhythm on amio.   VDRF: Reintubated for hypoxemic respiratory failure. Bronch with copious secretions that he was not clearing. Culture of BAL normal flora.  CCM following and working on vent wean.   Encephalopathy: limiting vent wean and progression.    AKI: creat continues to improve. He needs continued diuresis.   Merrem  and vanc for possible pneumonia with leukocytosis trending back down and low grade fever yesterday. CXR yesterday looked good.   Tolerating tube feeds at goal 60.   Hypokalemia: replete.   Hypernatremia: stable, follow. Getting free water .   LOS: 10 days    Dustin Davenport 02/11/2024

## 2024-02-11 NOTE — Progress Notes (Signed)
 Advanced Heart Failure Rounding Note  Cardiologist: None   Chief Complaint: STEMI> Cardiogenic shock> Now s/p aortic dissection repair + Bentall  Significant events:    - Echo 4/15: AAA measuring 5-5.7 cm. Followed by emergent TEE 4/15 which was significant for torrential aortic regurgitation and dissection flap extending from the aortic root to the proximal ascending aorta  - CT C/A/P: Showed acute type A dissection with a 5.5 cm ascending aortic aneurysm extending into arch. Dissection goes down to infrarenal aorta and extends up into innominate and left common carotid artery - 4/16 Underwent aortic dissection repair (Bentall/AVR and reimplantation of coronaries. Intra-op course c/b coagulopathy due to Effient  (PCI of RCA earlier in the day)).  - Extubated 4/18 - Re-intubated 4/19. Bronch with diffuse mucopurulent secretions  Subjective:    Continues on low dose vasopressors. Diuretics held yesterday. Failed SBT this morning due to tachypnea. Taken for RHC this morning to determine filling pressures.   Continues on vanc/meropenem , vanc to be completed.    Objective:    Weight Range: 119.3 kg Body mass index is 34.7 kg/m.   Vital Signs:   Temp:  [98.6 F (37 C)-100.8 F (38.2 C)] 100 F (37.8 C) (04/25 1945) Pulse Rate:  [0-111] 80 (04/25 1945) Resp:  [8-37] 31 (04/25 1945) BP: (95-134)/(52-78) 118/60 (04/25 1900) SpO2:  [85 %-100 %] 97 % (04/25 1945) Arterial Line BP: (107-156)/(43-63) 145/57 (04/25 0830) FiO2 (%):  [40 %-100 %] 40 % (04/25 1457) Weight:  [119.3 kg] 119.3 kg (04/25 0500) Last BM Date : 02/11/24  Weight change: Filed Weights   02/09/24 0150 02/10/24 0500 02/11/24 0500  Weight: 127.7 kg 121 kg 119.3 kg   Intake/Output:  Intake/Output Summary (Last 24 hours) at 02/11/2024 2002 Last data filed at 02/11/2024 1946 Gross per 24 hour  Intake 2934.43 ml  Output 2555 ml  Net 379.43 ml    Physical Exam   General:  Critically ill appearing HEENT: +  ETT/CorTrak Cor: RRR, no m/g/r Lungs: ventilated breath sounds Abdomen: obese, soft, mildly distended.  Extremities: b/l upper and lower extremity edema, RUE PICC Neuro: Sedated   Telemetry   SR 70s-80s  Labs    CBC Recent Labs    02/10/24 0414 02/11/24 0445 02/11/24 0923  WBC 23.3* 20.2*  --   HGB 7.7* 7.1* 8.2*  HCT 24.3* 22.7* 24.0*  MCV 88.7 89.0  --   PLT 381 406*  --    Basic Metabolic Panel Recent Labs    65/78/46 0414 02/10/24 1422 02/11/24 0445 02/11/24 0923  NA 156*  154* 155* 152* 157*  K 3.0*  3.1* 4.1 3.4* 4.0  CL 119*  118* 120* 118*  --   CO2 25  25 24 24   --   GLUCOSE 214*  230* 168* 279*  --   BUN 61*  61* 64* 60*  --   CREATININE 1.58*  1.62* 1.63* 1.38*  --   CALCIUM  7.5*  7.6* 7.9* 7.2*  --   MG 1.7  --  2.2  --   PHOS 1.3*  1.4*  --  2.2*  --    Liver Function Tests Recent Labs    02/09/24 0501 02/10/24 0414 02/11/24 0445  AST 126*  --   --   ALT 67*  --   --   ALKPHOS 131*  --   --   BILITOT 1.6*  --   --   PROT 5.0*  --   --   ALBUMIN  2.2* 1.9* 1.7*  D-Dimer No results for input(s): "DDIMER" in the last 72 hours.  Hemoglobin A1C No results for input(s): "HGBA1C" in the last 72 hours.  Medications:    Scheduled Medications:  aspirin   81 mg Per Tube Daily   atorvastatin   80 mg Per Tube Daily   Chlorhexidine  Gluconate Cloth  6 each Topical Daily   clonazePAM   1 mg Per Tube Q8H   clopidogrel   75 mg Per Tube Daily   feeding supplement (PROSource TF20)  60 mL Per Tube TID   free water   200 mL Per Tube Q4H   insulin  aspart  0-20 Units Subcutaneous Q4H   methocarbamol  (ROBAXIN ) injection  500 mg Intravenous Q8H   metoCLOPramide  (REGLAN ) injection  5 mg Intravenous Q8H   mouth rinse  15 mL Mouth Rinse Q2H   pantoprazole  (PROTONIX ) IV  40 mg Intravenous QHS   QUEtiapine   50 mg Per Tube BID   sodium chloride  flush  10-40 mL Intracatheter Q12H   Infusions:  amiodarone  30 mg/hr (02/11/24 1800)   dexmedetomidine   (PRECEDEX ) IV infusion Stopped (02/11/24 1604)   feeding supplement (VITAL 1.5 CAL) 60 mL/hr at 02/11/24 1800   fentaNYL  infusion INTRAVENOUS Stopped (02/11/24 0224)   meropenem  (MERREM ) IV 1 g (02/11/24 1955)   norepinephrine  (LEVOPHED ) Adult infusion 3 mcg/min (02/11/24 1800)   potassium PHOSPHATE IVPB (in mmol)     propofol  (DIPRIVAN ) infusion 20 mcg/kg/min (02/11/24 1950)   vancomycin  Stopped (02/10/24 1316)   vancomycin      PRN Medications: acetaminophen  (TYLENOL ) oral liquid 160 mg/5 mL, albuterol , fentaNYL , ondansetron  (ZOFRAN ) IV, mouth rinse, sodium chloride  flush  Patient Profile   Dustin Davenport is a 68 y.o. male with HTN and HLD. Admitted with STEMI now with CGS. Found to have AAA and taken emergently to the OR. Now s/p Bentall/AVR and reimplantation of coronaries.  Assessment/Plan   Acute type A aortic dissection  - Echo 4/15: AAA measuring 5-5.7 cm - Emergent TEE 4/15: significant for torrential aortic regurgitation and dissection flap extending from the aortic root to the proximal ascending aorta  - CT C/A/P: Showed acute type A dissection with a 5.5 cm ascending aortic aneurysm extending into arch. Dissection goes down to infrarenal aorta and extends up into innominate and left common carotid arteries  - Now s/p aortic dissection repair 02/01/24 (Bentall/AVR and reimplantation of coronaries.  2. CAD, Acute inferior STEMI - Admitted with STEMI. Post PCI found to also have aortic dissection - LHC with single vessel CAD involving the mid to distal RCA, successful PCI of the RCA with overlapping DES x 3. - Continue aspirin  81mg  daily - Continue plavix  75mg  daily - Continue atorvastatin  80mg  daily  3. Acute systolic HF ->  Cardiogenic shock - Post perfusion CGS.  - Echo 4/19: EF 25% RV moderate to severely decreased - Coox stable, on intermittent pressors. - Filling pressures normal on RHC, especially given postiive pressure ventilation.  - Given hypernatremia will give  1x dose of metolazone  today - Continue free water  flushes  4. Post-op hypoxic resp failure - extubated 4/18.  - re-intubated 4/19 - diffuse mucopurulent secretions on bronch 4/19. BAL GS: Mod WBC. Few gram variable rods. Rare yeast - Spiked a fever on 04/23. WBC down to 20 on meropenem /vanc. Repeat respiratory culture NGTD. - Failed SBT today as well  5. PAF with post-termination pauses PVCs - Patient with AF intra-op.  - Now back in NSR.  - Continues on IV amiodarone  whlie on pressors  6. Hypokalemia - K 3.4  7. AKI due to ATN/shock - Baseline SCr ~1.3-1.4 - SCr peaked at 3.2, now 1.38, suspect ATN with hypotension  8. ABLA post-op - Received 6uPRBCs, 6 plt, 4 FFP and 5 cryo intra-op.   - Hgb 7.1, will give unit tomorrow  CRITICAL CARE Performed by: [name]   Total critical care time: 38 minutes  Critical care time was exclusive of separately billable procedures and treating other patients.  Critical care was necessary to treat or prevent imminent or life-threatening deterioration.  Critical care was time spent personally by me on the following activities: development of treatment plan with patient and/or surrogate as well as nursing, discussions with consultants, evaluation of patient's response to treatment, examination of patient, obtaining history from patient or surrogate, ordering and performing treatments and interventions, ordering and review of laboratory studies, ordering and review of radiographic studies, pulse oximetry and re-evaluation of patient's condition.      Length of Stay: 10  [name], MD  02/11/2024, 8:02 PM  Advanced Heart Failure Team Pager [phone] (M-F; 7a - 5p)  Please contact [facility] for night-coverage after hours (5p -7a ) and weekends on [email or web address]

## 2024-02-11 NOTE — Progress Notes (Signed)
 eLink Physician-Brief Progress Note Patient Name: ONESIMO LINGARD DOB: 30-May-1956 MRN: [id number]   Date of Service  02/11/2024  HPI/Events of Note  Request to renew restraints for vented patient  eICU Interventions  Restraints renewed     Intervention Category Minor Interventions: Agitation / anxiety - evaluation and management  Xavia Kniskern Genetta Kenning 02/11/2024, 7:26 PM

## 2024-02-12 ENCOUNTER — Encounter (HOSPITAL_COMMUNITY): Payer: Self-pay | Admitting: Cardiology

## 2024-02-12 DIAGNOSIS — I480 Paroxysmal atrial fibrillation: Secondary | ICD-10-CM | POA: Diagnosis not present

## 2024-02-12 DIAGNOSIS — J9601 Acute respiratory failure with hypoxia: Secondary | ICD-10-CM | POA: Diagnosis not present

## 2024-02-12 DIAGNOSIS — J9602 Acute respiratory failure with hypercapnia: Secondary | ICD-10-CM | POA: Diagnosis not present

## 2024-02-12 DIAGNOSIS — J189 Pneumonia, unspecified organism: Secondary | ICD-10-CM | POA: Diagnosis not present

## 2024-02-12 DIAGNOSIS — I2111 ST elevation (STEMI) myocardial infarction involving right coronary artery: Secondary | ICD-10-CM | POA: Diagnosis not present

## 2024-02-12 LAB — POCT I-STAT 7, (LYTES, BLD GAS, ICA,H+H)
Acid-base deficit: 2 mmol/L (ref 0.0–2.0)
Bicarbonate: 23.2 mmol/L (ref 20.0–28.0)
Calcium, Ion: 1.2 mmol/L (ref 1.15–1.40)
HCT: 23 % — ABNORMAL LOW (ref 39.0–52.0)
Hemoglobin: 7.8 g/dL — ABNORMAL LOW (ref 13.0–17.0)
O2 Saturation: 93 %
Patient temperature: 98.6
Potassium: 4.3 mmol/L (ref 3.5–5.1)
Sodium: 152 mmol/L — ABNORMAL HIGH (ref 135–145)
TCO2: 24 mmol/L (ref 22–32)
pCO2 arterial: 41.7 mmHg (ref 32–48)
pH, Arterial: 7.353 (ref 7.35–7.45)
pO2, Arterial: 70 mmHg — ABNORMAL LOW (ref 83–108)

## 2024-02-12 LAB — GLUCOSE, CAPILLARY
Glucose-Capillary: 173 mg/dL — ABNORMAL HIGH (ref 70–99)
Glucose-Capillary: 178 mg/dL — ABNORMAL HIGH (ref 70–99)
Glucose-Capillary: 187 mg/dL — ABNORMAL HIGH (ref 70–99)
Glucose-Capillary: 204 mg/dL — ABNORMAL HIGH (ref 70–99)
Glucose-Capillary: 204 mg/dL — ABNORMAL HIGH (ref 70–99)
Glucose-Capillary: 213 mg/dL — ABNORMAL HIGH (ref 70–99)
Glucose-Capillary: 221 mg/dL — ABNORMAL HIGH (ref 70–99)

## 2024-02-12 LAB — RENAL FUNCTION PANEL
Albumin: 1.9 g/dL — ABNORMAL LOW (ref 3.5–5.0)
Anion gap: 11 (ref 5–15)
BUN: 62 mg/dL — ABNORMAL HIGH (ref 8–23)
CO2: 23 mmol/L (ref 22–32)
Calcium: 8.1 mg/dL — ABNORMAL LOW (ref 8.9–10.3)
Chloride: 119 mmol/L — ABNORMAL HIGH (ref 98–111)
Creatinine, Ser: 1.41 mg/dL — ABNORMAL HIGH (ref 0.61–1.24)
GFR, Estimated: 54 mL/min — ABNORMAL LOW (ref >60)
Glucose, Bld: 212 mg/dL — ABNORMAL HIGH (ref 70–99)
Phosphorus: 4.5 mg/dL (ref 2.5–4.6)
Potassium: 4.5 mmol/L (ref 3.5–5.1)
Sodium: 153 mmol/L — ABNORMAL HIGH (ref 135–145)

## 2024-02-12 LAB — CBC
HCT: 26 % — ABNORMAL LOW (ref 39.0–52.0)
Hemoglobin: 7.9 g/dL — ABNORMAL LOW (ref 13.0–17.0)
MCH: 28.1 pg (ref 26.0–34.0)
MCHC: 30.4 g/dL (ref 30.0–36.0)
MCV: 92.5 fL (ref 80.0–100.0)
Platelets: 537 10*3/uL — ABNORMAL HIGH (ref 150–400)
RBC: 2.81 MIL/uL — ABNORMAL LOW (ref 4.22–5.81)
RDW: 20.6 % — ABNORMAL HIGH (ref 11.5–15.5)
WBC: 22.4 10*3/uL — ABNORMAL HIGH (ref 4.0–10.5)
nRBC: 5 % — ABNORMAL HIGH (ref 0.0–0.2)

## 2024-02-12 LAB — COOXEMETRY PANEL
Carboxyhemoglobin: 1.8 % — ABNORMAL HIGH (ref 0.5–1.5)
Methemoglobin: <0.7 % (ref 0.0–1.5)
O2 Saturation: 88.9 %
Total hemoglobin: 8.2 g/dL — ABNORMAL LOW (ref 12.0–16.0)

## 2024-02-12 LAB — MAGNESIUM: Magnesium: 2.4 mg/dL (ref 1.7–2.4)

## 2024-02-12 LAB — PROCALCITONIN: Procalcitonin: 1.04 ng/mL

## 2024-02-12 LAB — TRIGLYCERIDES: Triglycerides: 151 mg/dL — ABNORMAL HIGH (ref <150)

## 2024-02-12 MED ORDER — QUETIAPINE FUMARATE 100 MG PO TABS
100.0000 mg | ORAL_TABLET | Freq: Two times a day (BID) | ORAL | Status: DC
  Administered 2024-02-12 – 2024-02-15 (×7): 100 mg
  Filled 2024-02-12 (×8): qty 1

## 2024-02-12 MED ORDER — ALBUMIN HUMAN 5 % IV SOLN: 12.5000 g | Freq: Once | INTRAVENOUS | Status: AC

## 2024-02-12 MED ORDER — ROSUVASTATIN CALCIUM 20 MG PO TABS
40.0000 mg | ORAL_TABLET | Freq: Every day | ORAL | Status: DC
  Administered 2024-02-12 – 2024-02-17 (×6): 40 mg
  Filled 2024-02-12 (×6): qty 2

## 2024-02-12 MED ORDER — ALBUMIN HUMAN 5 % IV SOLN
INTRAVENOUS | Status: AC
  Administered 2024-02-12: 12.5 g via INTRAVENOUS
  Filled 2024-02-12: qty 250

## 2024-02-12 MED ORDER — ALBUMIN HUMAN 5 % IV SOLN
12.5000 g | Freq: Once | INTRAVENOUS | Status: AC
  Administered 2024-02-12: 12.5 g via INTRAVENOUS
  Filled 2024-02-12: qty 250

## 2024-02-12 NOTE — Progress Notes (Signed)
 Pt placed on PS/CPAP 12/5 on 40% and is tolerating well.

## 2024-02-12 NOTE — Progress Notes (Signed)
 Advanced Heart Failure Rounding Note  Cardiologist: None   Chief Complaint: STEMI> Cardiogenic shock> Now s/p aortic dissection repair + Bentall  Significant events:    - Echo 4/15: AAA measuring 5-5.7 cm. Followed by emergent TEE 4/15 which was significant for torrential aortic regurgitation and dissection flap extending from the aortic root to the proximal ascending aorta  - CT C/A/P: Showed acute type A dissection with a 5.5 cm ascending aortic aneurysm extending into arch. Dissection goes down to infrarenal aorta and extends up into innominate and left common carotid artery - 4/16 Underwent aortic dissection repair (Bentall/AVR and reimplantation of coronaries. Intra-op course c/b coagulopathy due to Effient  (PCI of RCA earlier in the day)).  - Extubated 4/18 - Re-intubated 4/19. Bronch with diffuse mucopurulent secretions  Subjective:    Doing well on SBT this morning, mild tachypnea but otherwise stable. Off vasopressors. WBC mildly increased but no further fevers. Broad spectrum abx since 4/19, continues on meropenem  alone given worsening on zosyn .  Suspect bed weights inaccurate given recent trend.    Objective:    Weight Range: 128.3 kg Body mass index is 37.32 kg/m.   Vital Signs:   Temp:  [98.6 F (37 C)-100.2 F (37.9 C)] 99 F (37.2 C) (04/26 1200) Pulse Rate:  [52-119] 76 (04/26 1200) Resp:  [12-36] 30 (04/26 1200) BP: (105-137)/(44-79) 126/67 (04/26 1200) SpO2:  [79 %-100 %] 97 % (04/26 1200) FiO2 (%):  [30 %-40 %] 40 % (04/26 1033) Weight:  [128.3 kg] 128.3 kg (04/26 0449) Last BM Date : 02/12/24  Weight change: Filed Weights   02/10/24 0500 02/11/24 0500 02/12/24 0449  Weight: 121 kg 119.3 kg 128.3 kg   Intake/Output:  Intake/Output Summary (Last 24 hours) at 02/12/2024 1243 Last data filed at 02/12/2024 1228 Gross per 24 hour  Intake 3546.39 ml  Output 3035 ml  Net 511.39 ml    Physical Exam   General:  Critically ill appearing HEENT: +  ETT/CorTrak Cor: RRR, no m/g/r Lungs: ventilated breath sounds, mild rhonchi Abdomen: obese, soft, mildly distended.  Extremities: b/l upper and lower extremity edema, RUE PICC Neuro: Sedated   Telemetry   SR 80s  Labs    CBC Recent Labs    02/11/24 0445 02/11/24 0923 02/12/24 0431  WBC 20.2*  --  22.4*  HGB 7.1* 8.2* 7.9*  HCT 22.7* 24.0* 26.0*  MCV 89.0  --  92.5  PLT 406*  --  537*   Basic Metabolic Panel Recent Labs    16/10/96 0445 02/11/24 0923 02/12/24 0431  NA 152* 157* 153*  K 3.4* 4.0 4.5  CL 118*  --  119*  CO2 24  --  23  GLUCOSE 279*  --  212*  BUN 60*  --  62*  CREATININE 1.38*  --  1.41*  CALCIUM  7.2*  --  8.1*  MG 2.2  --  2.4  PHOS 2.2*  --  4.5   Liver Function Tests Recent Labs    02/11/24 0445 02/12/24 0431  ALBUMIN  1.7* 1.9*   D-Dimer No results for input(s): "DDIMER" in the last 72 hours.  Hemoglobin A1C No results for input(s): "HGBA1C" in the last 72 hours.  Medications:    Scheduled Medications:  aspirin   81 mg Per Tube Daily   Chlorhexidine  Gluconate Cloth  6 each Topical Daily   clonazePAM   1 mg Per Tube Q8H   clopidogrel   75 mg Per Tube Daily   feeding supplement (PROSource TF20)  60 mL Per  Tube TID   free water   200 mL Per Tube Q4H   insulin  aspart  0-20 Units Subcutaneous Q4H   methocarbamol  (ROBAXIN ) injection  500 mg Intravenous Q8H   metoCLOPramide  (REGLAN ) injection  5 mg Intravenous Q8H   mouth rinse  15 mL Mouth Rinse Q2H   pantoprazole  (PROTONIX ) IV  40 mg Intravenous QHS   QUEtiapine   100 mg Per Tube BID   rosuvastatin  40 mg Per Tube Daily   sodium chloride  flush  10-40 mL Intracatheter Q12H   Infusions:  amiodarone  30 mg/hr (02/12/24 1103)   feeding supplement (VITAL 1.5 CAL) 60 mL/hr at 02/12/24 0700   meropenem  (MERREM ) IV 1 g (02/12/24 1228)   norepinephrine  (LEVOPHED ) Adult infusion Stopped (02/11/24 2000)   propofol  (DIPRIVAN ) infusion 20 mcg/kg/min (02/12/24 0700)   PRN  Medications: acetaminophen  (TYLENOL ) oral liquid 160 mg/5 mL, albuterol , fentaNYL , ondansetron  (ZOFRAN ) IV, mouth rinse, sodium chloride  flush  Patient Profile   Dustin Davenport is a 68 y.o. male with HTN and HLD. Admitted with STEMI now with CGS. Found to have AAA and taken emergently to the OR. Now s/p Bentall/AVR and reimplantation of coronaries.  Assessment/Plan   Acute type A aortic dissection  - Echo 4/15: AAA measuring 5-5.7 cm - Emergent TEE 4/15: significant for torrential aortic regurgitation and dissection flap extending from the aortic root to the proximal ascending aorta  - CT C/A/P: Showed acute type A dissection with a 5.5 cm ascending aortic aneurysm extending into arch. Dissection goes down to infrarenal aorta and extends up into innominate and left common carotid arteries  - Now s/p aortic dissection repair 02/01/24 (Bentall/AVR and reimplantation of coronaries.  2. CAD, Acute inferior STEMI - Admitted with STEMI. Post PCI found to also have aortic dissection - LHC with single vessel CAD involving the mid to distal RCA, successful PCI of the RCA with overlapping DES x 3. - Continue aspirin  81mg  daily - Continue plavix  75mg  daily - Continue atorvastatin  80mg  daily  3. Acute systolic HF ->  Cardiogenic shock - Post perfusion CGS.  - Echo 4/19: EF 25% RV moderate to severely decreased - Coox stable, on intermittent pressors. - Filling pressures normal on RHC 4/25, especially given postiive pressure ventilation.   4. Post-op hypoxic resp failure - extubated 4/18.  - re-intubated 4/19 - diffuse mucopurulent secretions on bronch 4/19. BAL GS: Mod WBC. Few gram variable rods. Rare yeast - Spiked a fever on 04/23. WBC count still elevated to 22 on meropenem , completed IV vanc. Infectious workup NGTD - Did well with SBT today, continue to rehab lungs  5. PAF with post-termination pauses PVCs - Patient with AF intra-op.  - Now back in NSR.  - Continues on IV amiodarone   whlie on pressors - Discussion with CT surgery about whether ok to start anticoagulation with heparin  gtt  6. Hypokalemia/hypernatremia/hyperchloremia - Suspect diuretic induced RHC with normal filling pressures - Holding diuresis today s/p 1 dose of metolazone  yesterday - free water  flushes  7. AKI due to ATN/shock - Baseline SCr ~1.3-1.4 - SCr peaked at 3.2, now 1.41, suspect ATN with hypotension  8. ABLA post-op - Received 6uPRBCs, 6 plt, 4 FFP and 5 cryo intra-op.   - Hgb 7.9, continue to follow  CRITICAL CARE Performed by: [name]   Total critical care time: 32 minutes  Critical care time was exclusive of separately billable procedures and treating other patients.  Critical care was necessary to treat or prevent imminent or life-threatening deterioration.  Critical care was time spent personally by me on the following activities: development of treatment plan with patient and/or surrogate as well as nursing, discussions with consultants, evaluation of patient's response to treatment, examination of patient, obtaining history from patient or surrogate, ordering and performing treatments and interventions, ordering and review of laboratory studies, ordering and review of radiographic studies, pulse oximetry and re-evaluation of patient's condition.      Length of Stay: 12  [name], MD  02/12/2024, 12:43 PM  Advanced Heart Failure Team Pager [phone] (M-F; 7a - 5p)  Please contact [facility] for night-coverage after hours (5p -7a ) and weekends on [email or web address]

## 2024-02-12 NOTE — Plan of Care (Signed)
  Problem: Clinical Measurements: Goal: Ability to maintain clinical measurements within normal limits will improve Outcome: Progressing Goal: Will remain free from infection Outcome: Progressing Goal: Diagnostic test results will improve Outcome: Progressing Goal: Respiratory complications will improve Outcome: Progressing   Problem: Nutrition: Goal: Adequate nutrition will be maintained Outcome: Progressing   Problem: Coping: Goal: Level of anxiety will decrease Outcome: Progressing

## 2024-02-12 NOTE — Progress Notes (Signed)
 [address]       [place] [address]             [phone]                 1 Day Post-Op Procedure(s) (LRB): RIGHT HEART CATH (N/A)   Events: No events _______________________________________________________________ Vitals: BP 105/62   Pulse 74   Temp 99.3 F (37.4 C)   Resp (!) 31   Ht 6\' 1"  (1.854 m)   Wt 128.3 kg   SpO2 94%   BMI 37.32 kg/m  Filed Weights   02/10/24 0500 02/11/24 0500 02/12/24 0449  Weight: 121 kg 119.3 kg 128.3 kg     - Neuro: sedated  - Cardiovascular: sinus  Drips: amio.   CVP:  [3 mmHg-67 mmHg] 13 mmHg  - Pulm:  Vent Mode: PSV;CPAP FiO2 (%):  [30 %-40 %] 40 % Set Rate:  [20 bmp] 20 bmp Vt Set:  [570 mL] 570 mL PEEP:  [5 cmH20] 5 cmH20 Pressure Support:  [12 cmH20] 12 cmH20 Plateau Pressure:  [25 cmH20] 25 cmH20  ABG    Component Value Date/Time   PHART 7.436 02/08/2024 0410   PCO2ART 41.5 02/08/2024 0410   PO2ART 66 (L) 02/08/2024 0410   HCO3 26.7 02/11/2024 0923   TCO2 28 02/11/2024 0923   ACIDBASEDEF 4.0 (H) 02/02/2024 0859   O2SAT 88.9 02/12/2024 0439    - Abd: ND - Extremity: warm  .Intake/Output      04/25 0701 04/26 0700 04/26 0701 04/27 0700   I.V. (mL/kg) 922.3 (7.2) 10 (0.1)   NG/GT 2340 90   IV Piggyback 677.5    Total Intake(mL/kg) 3939.8 (30.7) 100 (0.8)   Urine (mL/kg/hr) 2435 (0.8) 250 (0.6)   Stool 250    Total Output 2685 250   Net +1254.8 -150           _______________________________________________________________ Labs:    Latest Ref Rng & Units 02/12/2024    4:31 AM 02/11/2024    9:23 AM 02/11/2024    4:45 AM  CBC  WBC 4.0 - 10.5 K/uL 22.4   20.2   Hemoglobin 13.0 - 17.0 g/dL 7.9  8.2  7.1   Hematocrit 39.0 - 52.0 % 26.0  24.0  22.7   Platelets 150 - 400 K/uL 537   406       Latest Ref Rng & Units 02/12/2024    4:31 AM 02/11/2024    9:23 AM 02/11/2024    4:45 AM  CMP  Glucose 70 - 99 mg/dL 914   782   BUN 8 - 23 mg/dL 62   60   Creatinine 9.56 - 1.24 mg/dL  2.13   0.86   Sodium 578 - 145 mmol/L 153  157  152   Potassium 3.5 - 5.1 mmol/L 4.5  4.0  3.4   Chloride 98 - 111 mmol/L 119   118   CO2 22 - 32 mmol/L 23   24   Calcium  8.9 - 10.3 mg/dL 8.1   7.2     CXR: stable  _______________________________________________________________  Assessment and Plan: POD 10 s/p Ascending, arch, bBentall  Neuro: on propfol.  Weaning sedation CV: on amio, hx of afib.   Pulm: vent sprints.  Weaning to extubate Renal: creat stable GI: on tube feeds Heme: stable.  On plavix  for PCI.  Will discuss anticoagulation, but back in sinus ID: afebrile Endo: SSI Dispo: continue ICU care.   Dustin Davenport  02/12/2024 10:22 AM

## 2024-02-12 NOTE — Plan of Care (Signed)
   Problem: Clinical Measurements: Goal: Will remain free from infection Outcome: Progressing Goal: Diagnostic test results will improve Outcome: Progressing Goal: Respiratory complications will improve Outcome: Progressing Goal: Cardiovascular complication will be avoided Outcome: Progressing   Problem: Nutrition: Goal: Adequate nutrition will be maintained Outcome: Progressing

## 2024-02-12 NOTE — Progress Notes (Signed)
 Dustin Davenport, MRN:  [id number], DOB:  28-May-1956, LOS: 11 ADMISSION DATE:  02/01/2024, CONSULTATION DATE:  02/03/24 REFERRING MD:  Sherene Dilling, CHIEF COMPLAINT:  chest pain   History of Present Illness:  68 year old man w/ hx of HTN, obesity who presented on 4/15 with sudden onset chest pain.  EKG showing inferior STEMI and went to cath lab for RCA DES x 3.  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  Taken emergently to OR for Bentall+ ascending/arch replacement.  Procedure complicated by coagulopathy related to antiplatelet therapy from recent stent, multiple liters of blood products given.  Returned to ICU 4/16 on vent and PCCM consulted to assist with vent wean on 4/17.  Currently heavily sedated; attempts at weaning resulted in agitation without ability to redirect.  Pertinent  Medical History   Past Medical History:  Diagnosis Date   Hypertension      Significant Hospital Events: Including procedures, antibiotic start and stop dates in addition to other pertinent events   4/16 RCA stent then Bentall, aortic arch replacement 4/18 extubated 4/19 reintubated in afternoon for aspiration, bronch, Delirious, low grade fevers, expressive aphasia 4/20 but mental status precludes extubation 4/21 started spiking fever again, meropenem  switched back from Zosyn , on vancomycin  as well 02/07/22 failed spontaneous breathing trial 4/24 Tachypnea, fever. Resp culture sent  4/25 underwent right heart cath showing normal filling pressure  Interim History / Subjective:  Patient became afebrile with temperature spike up to 100  Precedex  was switched to moderate dose propofol  Remained in A-fib with controlled rate Tachypneic  Objective   Blood pressure (!) 126/55, pulse 74, temperature 99.3 F (37.4 C), temperature source Bladder, resp. rate (!) 29, height 6\' 1"  (1.854 m), weight 128.3 kg, SpO2 96%. CVP:   [3 mmHg-67 mmHg] 16 mmHg  Vent Mode: PRVC FiO2 (%):  [30 %-100 %] 40 % Set Rate:  [15 bmp-20 bmp] 20 bmp Vt Set:  [570 mL] 570 mL PEEP:  [5 cmH20] 5 cmH20 Pressure Support:  [10 cmH20] 10 cmH20 Plateau Pressure:  [19 cmH20-25 cmH20] 25 cmH20   Intake/Output Summary (Last 24 hours) at 02/12/2024 0733 Last data filed at 02/12/2024 0700 Gross per 24 hour  Intake 3939.77 ml  Output 2685 ml  Net 1254.77 ml   Filed Weights   02/10/24 0500 02/11/24 0500 02/12/24 0449  Weight: 121 kg 119.3 kg 128.3 kg    Examination: General: Crtitically ill-appearing obese elderly male, orally intubated HEENT: [place]/AT, eyes anicteric.  ETT and Cortrak in place Neuro: Opens eyes with painful stimuli, intermittently following commands, wiggling toes  Chest: Central sternotomy and incision for CP on right side of chest looks clean and dry, tachypneic, bilateral rhonchorous breath sounds Heart: Irregularly irregular, no murmurs or gallops Abdomen: Soft, nondistended, bowel sounds present Skin: No rash  Cr 1.4 Na 153 K 4.5 Phos 4.5 Mag 2.4 WBC 22 H/H 7.9/26 BAL: Gram variable rods and moderate yeast Coox 89%  I/O net +1.2 L in last 24 hours, with UO 2.7L Echo LVEF 35% with RV dilatation and reduced function  Resolved Hospital Problem list   Constipation Shock combined cardiogenic/septic Hypokalemia/hypophosphatemia Shock liver, improving Perioperative coagulopathy  Assessment & Plan:  Acute inferior wall STEMI status post PCI and DES 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 Paroxysmal A-fib with RVR Acute respiratory failure with hypoxia and hypercapnia Bilateral multifocal pneumonia with Gram variable  rods Acute kidney injury due to ischemic ATN from shock, improving Hypernatremia due to aggressive diuresis Acute encephalopathy, likely septic Perioperative acute blood loss anemia Obesity  Continue aspirin , Plavix  and atorvastatin  TCTS  following Remain in A-fib with controlled rate on amiodarone  infusion Not on anticoagulation Continue lung protective ventilation Patient is placed on spontaneous breathing trial with 12/5, he is tachypneic but at least we can exercise him Not ready for extubation yet VAP prevention bundle in place PAD protocol with moderate dose propofol  He is afebrile now Completed course of vancomycin  for 7 days Continue meropenem  for few more days Serum creatinine is stable around 1.3-1.4 Diuretics deferred to advanced heart failure Continue free water  flushes He remained net positive Monitor serum sodium H&H is stable between 7-8, closely monitor   Best Practice (right click and "Reselect all SmartList Selections" daily)   Diet/type: Tube feeds DVT prophylaxis per TCTS Pressure ulcer(s): N/A GI prophylaxis: PPI Lines: Central line Foley:  Yes, and it is still needed Code Status:  full code Last date of multidisciplinary goals of care discussion: 4/25: Patient's wife was updated at bedside, stated that he is failing breathing trial, may require tracheostomy, will watch over the weekend, but high likelihood he will need tracheostomy next week  The patient is critically ill due to acute respiratory failure with hypoxia and hypercapnia/septic shock. critical care was necessary to treat or prevent imminent or life-threatening deterioration.  Critical care was time spent personally by me on the following activities: development of treatment plan with patient and/or surrogate as well as nursing, discussions with consultants, evaluation of patient's response to treatment, examination of patient, obtaining history from patient or surrogate, ordering and performing treatments and interventions, ordering and review of laboratory studies, ordering and review of radiographic studies, pulse oximetry, re-evaluation of patient's condition and participation in multidisciplinary rounds.   During this encounter  critical care time was devoted to patient care services described in this note for 36 minutes.     Trevor Fudge, MD [facility] See Amion for pager If no response to pager, please call [phone] until 7pm After 7pm, Please call E-link [phone]

## 2024-02-13 ENCOUNTER — Inpatient Hospital Stay (HOSPITAL_COMMUNITY)

## 2024-02-13 DIAGNOSIS — J9602 Acute respiratory failure with hypercapnia: Secondary | ICD-10-CM | POA: Diagnosis not present

## 2024-02-13 DIAGNOSIS — I2111 ST elevation (STEMI) myocardial infarction involving right coronary artery: Secondary | ICD-10-CM | POA: Diagnosis not present

## 2024-02-13 DIAGNOSIS — J189 Pneumonia, unspecified organism: Secondary | ICD-10-CM | POA: Diagnosis not present

## 2024-02-13 DIAGNOSIS — I480 Paroxysmal atrial fibrillation: Secondary | ICD-10-CM | POA: Diagnosis not present

## 2024-02-13 DIAGNOSIS — J9601 Acute respiratory failure with hypoxia: Secondary | ICD-10-CM | POA: Diagnosis not present

## 2024-02-13 LAB — CBC
HCT: 23 % — ABNORMAL LOW (ref 39.0–52.0)
Hemoglobin: 7.1 g/dL — ABNORMAL LOW (ref 13.0–17.0)
MCH: 28.7 pg (ref 26.0–34.0)
MCHC: 30.9 g/dL (ref 30.0–36.0)
MCV: 93.1 fL (ref 80.0–100.0)
Platelets: 532 10*3/uL — ABNORMAL HIGH (ref 150–400)
RBC: 2.47 MIL/uL — ABNORMAL LOW (ref 4.22–5.81)
RDW: 20.7 % — ABNORMAL HIGH (ref 11.5–15.5)
WBC: 24.8 10*3/uL — ABNORMAL HIGH (ref 4.0–10.5)
nRBC: 7.6 % — ABNORMAL HIGH (ref 0.0–0.2)

## 2024-02-13 LAB — RENAL FUNCTION PANEL
Albumin: 2.1 g/dL — ABNORMAL LOW (ref 3.5–5.0)
Anion gap: 9 (ref 5–15)
BUN: 80 mg/dL — ABNORMAL HIGH (ref 8–23)
CO2: 23 mmol/L (ref 22–32)
Calcium: 8.1 mg/dL — ABNORMAL LOW (ref 8.9–10.3)
Chloride: 117 mmol/L — ABNORMAL HIGH (ref 98–111)
Creatinine, Ser: 1.51 mg/dL — ABNORMAL HIGH (ref 0.61–1.24)
GFR, Estimated: 50 mL/min — ABNORMAL LOW (ref >60)
Glucose, Bld: 266 mg/dL — ABNORMAL HIGH (ref 70–99)
Phosphorus: 4.2 mg/dL (ref 2.5–4.6)
Potassium: 4.4 mmol/L (ref 3.5–5.1)
Sodium: 149 mmol/L — ABNORMAL HIGH (ref 135–145)

## 2024-02-13 LAB — CULTURE, RESPIRATORY W GRAM STAIN

## 2024-02-13 LAB — COOXEMETRY PANEL
Carboxyhemoglobin: 1.7 % — ABNORMAL HIGH (ref 0.5–1.5)
Methemoglobin: <0.7 % (ref 0.0–1.5)
O2 Saturation: 64 %
Total hemoglobin: 8.6 g/dL — ABNORMAL LOW (ref 12.0–16.0)

## 2024-02-13 LAB — GLUCOSE, CAPILLARY
Glucose-Capillary: 185 mg/dL — ABNORMAL HIGH (ref 70–99)
Glucose-Capillary: 190 mg/dL — ABNORMAL HIGH (ref 70–99)
Glucose-Capillary: 204 mg/dL — ABNORMAL HIGH (ref 70–99)
Glucose-Capillary: 216 mg/dL — ABNORMAL HIGH (ref 70–99)
Glucose-Capillary: 242 mg/dL — ABNORMAL HIGH (ref 70–99)
Glucose-Capillary: 243 mg/dL — ABNORMAL HIGH (ref 70–99)

## 2024-02-13 LAB — MAGNESIUM: Magnesium: 2.6 mg/dL — ABNORMAL HIGH (ref 1.7–2.4)

## 2024-02-13 LAB — HEPARIN LEVEL (UNFRACTIONATED)
Heparin Unfractionated: <0.1 [IU]/mL — ABNORMAL LOW (ref 0.30–0.70)
Heparin Unfractionated: 0.18 [IU]/mL — ABNORMAL LOW (ref 0.30–0.70)

## 2024-02-13 LAB — PROCALCITONIN: Procalcitonin: 1.05 ng/mL

## 2024-02-13 MED ORDER — IOHEXOL 350 MG/ML SOLN
75.0000 mL | Freq: Once | INTRAVENOUS | Status: AC | PRN
Start: 2024-02-13 — End: 2024-02-13
  Administered 2024-02-13: 75 mL via INTRAVENOUS

## 2024-02-13 MED ORDER — METOLAZONE 5 MG PO TABS
5.0000 mg | ORAL_TABLET | Freq: Once | ORAL | Status: AC
  Administered 2024-02-13: 5 mg via ORAL
  Filled 2024-02-13: qty 1

## 2024-02-13 MED ORDER — INSULIN GLARGINE-YFGN 100 UNIT/ML ~~LOC~~ SOLN
5.0000 [IU] | Freq: Two times a day (BID) | SUBCUTANEOUS | Status: DC
  Administered 2024-02-13 (×2): 5 [IU] via SUBCUTANEOUS
  Filled 2024-02-13 (×4): qty 0.05

## 2024-02-13 MED ORDER — POTASSIUM CHLORIDE 20 MEQ PO PACK
40.0000 meq | PACK | ORAL | Status: AC
Start: 2024-02-13 — End: 2024-02-13
  Administered 2024-02-13 (×2): 40 meq
  Filled 2024-02-13 (×2): qty 2

## 2024-02-13 MED ORDER — NOREPINEPHRINE 4 MG/250ML-% IV SOLN
0.0000 ug/min | INTRAVENOUS | Status: DC
  Administered 2024-02-14: 2 ug/min via INTRAVENOUS

## 2024-02-13 MED ORDER — HEPARIN (PORCINE) 25000 UT/250ML-% IV SOLN
1600.0000 [IU]/h | INTRAVENOUS | Status: DC
  Administered 2024-02-13: 1200 [IU]/h via INTRAVENOUS
  Administered 2024-02-14: 1500 [IU]/h via INTRAVENOUS
  Administered 2024-02-15 – 2024-02-17 (×4): 1600 [IU]/h via INTRAVENOUS
  Filled 2024-02-13 (×8): qty 250

## 2024-02-13 MED ORDER — FUROSEMIDE 10 MG/ML IJ SOLN
80.0000 mg | Freq: Two times a day (BID) | INTRAMUSCULAR | Status: AC
  Administered 2024-02-13 (×2): 80 mg via INTRAVENOUS
  Filled 2024-02-13 (×2): qty 8

## 2024-02-13 NOTE — Progress Notes (Signed)
 Pt transported on vent to CT and back to 2H24 without complications.

## 2024-02-13 NOTE — Progress Notes (Addendum)
 PHARMACY - ANTICOAGULATION CONSULT NOTE  Pharmacy Consult for UFH IV Infusion Indication: atrial fibrillation  Allergies  Allergen Reactions   Bee Venom     Patient Measurements: Height: 6\' 1"  (185.4 cm) Weight: 131.9 kg (290 lb 12.6 oz) IBW/kg (Calculated) : 79.9 HEPARIN  DW (KG): 106  Vital Signs: Temp: 97.5 F (36.4 C) (04/27 0800) Temp Source: Bladder (04/27 0400) BP: 100/49 (04/27 0900) Pulse Rate: 85 (04/27 0900)  Labs: Recent Labs    02/11/24 0445 02/11/24 0923 02/12/24 0431 02/12/24 2335 02/13/24 0317  HGB 7.1*   < > 7.9* 7.8* 7.1*  HCT 22.7*   < > 26.0* 23.0* 23.0*  PLT 406*  --  537*  --  532*  CREATININE 1.38*  --  1.41*  --  1.51*   < > = values in this interval not displayed.    Estimated Creatinine Clearance: 66.7 mL/min (A) (by C-G formula based on SCr of 1.51 mg/dL (H)).   Medical History: Past Medical History:  Diagnosis Date   Hypertension     Medications:  Medications Prior to Admission  Medication Sig Dispense Refill Last Dose/Taking   acetaminophen  (TYLENOL ) 500 MG tablet Take 1,000 mg by mouth 2 (two) times daily as needed for mild pain (pain score 1-3), moderate pain (pain score 4-6), fever or headache.   01/31/2024 Evening   chlorthalidone (HYGROTON) 25 MG tablet Take 25 mg by mouth daily.   01/31/2024 Morning   losartan (COZAAR) 100 MG tablet Take 50 mg by mouth daily.   01/31/2024 Morning   metoprolol  succinate (TOPROL -XL) 100 MG 24 hr tablet Take 50 mg by mouth daily. Take with or immediately following a meal.   01/31/2024 Morning   potassium chloride  (KLOR-CON ) 10 MEQ tablet Take 10 mEq by mouth daily.   01/31/2024 Morning   Scheduled:   Chlorhexidine  Gluconate Cloth  6 each Topical Daily   clonazePAM   1 mg Per Tube Q8H   clopidogrel   75 mg Per Tube Daily   feeding supplement (PROSource TF20)  60 mL Per Tube TID   free water   200 mL Per Tube Q4H   furosemide   80 mg Intravenous Q12H   insulin  aspart  0-20 Units Subcutaneous Q4H    insulin  glargine-yfgn  5 Units Subcutaneous BID   methocarbamol  (ROBAXIN ) injection  500 mg Intravenous Q8H   metoCLOPramide  (REGLAN ) injection  5 mg Intravenous Q8H   mouth rinse  15 mL Mouth Rinse Q2H   pantoprazole  (PROTONIX ) IV  40 mg Intravenous QHS   QUEtiapine   100 mg Per Tube BID   rosuvastatin  40 mg Per Tube Daily   sodium chloride  flush  10-40 mL Intracatheter Q12H   Infusions:   amiodarone  30 mg/hr (02/13/24 1300)   feeding supplement (VITAL 1.5 CAL) 60 mL/hr at 02/13/24 1300   heparin  1,200 Units/hr (02/13/24 1300)   meropenem  (MERREM ) IV 200 mL/hr at 02/13/24 1300   norepinephrine  (LEVOPHED ) Adult infusion Stopped (02/13/24 0224)   propofol  (DIPRIVAN ) infusion 19.978 mcg/kg/min (02/13/24 1350)   PRN: acetaminophen  (TYLENOL ) oral liquid 160 mg/5 mL, albuterol , fentaNYL , ondansetron  (ZOFRAN ) IV, mouth rinse, sodium chloride  flush Anti-infectives (From admission, onward)    Start     Dose/Rate Route Frequency Ordered Stop   02/11/24 2045  vancomycin  (VANCOREADY) IVPB 1750 mg/350 mL        1,750 mg 175 mL/hr over 120 Minutes Intravenous  Once 02/11/24 1958 02/11/24 2237   02/09/24 2000  meropenem  (MERREM ) 1 g in sodium chloride  0.9 % 100 mL IVPB  1 g 200 mL/hr over 30 Minutes Intravenous Every 8 hours 02/09/24 1826     02/08/24 0900  vancomycin  (VANCOREADY) IVPB 1750 mg/350 mL        1,750 mg 175 mL/hr over 120 Minutes Intravenous Every 24 hours 02/08/24 0800 02/12/24 0959   02/07/24 1400  piperacillin -tazobactam (ZOSYN ) IVPB 3.375 g  Status:  Discontinued        3.375 g 12.5 mL/hr over 240 Minutes Intravenous Every 8 hours 02/07/24 1241 02/09/24 1822   02/06/24 2100  vancomycin  (VANCOCIN ) IVPB 1000 mg/200 mL premix        1,000 mg 200 mL/hr over 60 Minutes Intravenous  Once 02/06/24 1907 02/06/24 2155   02/05/24 1615  vancomycin  (VANCOREADY) IVPB 2000 mg/400 mL        2,000 mg 200 mL/hr over 120 Minutes Intravenous  Once 02/05/24 1523 02/05/24 1927   02/05/24  1615  meropenem  (MERREM ) 1 g in sodium chloride  0.9 % 100 mL IVPB  Status:  Discontinued        1 g 200 mL/hr over 30 Minutes Intravenous Every 12 hours 02/05/24 1524 02/07/24 1241   02/05/24 1521  vancomycin  variable dose per unstable renal function (pharmacist dosing)  Status:  Discontinued         Does not apply See admin instructions 02/05/24 1523 02/08/24 0800   02/02/24 0915  vancomycin  (VANCOCIN ) IVPB 1000 mg/200 mL premix        1,000 mg 200 mL/hr over 60 Minutes Intravenous  Once 02/02/24 0625 02/02/24 1024   02/02/24 0715  ceFAZolin  (ANCEF ) IVPB 2g/100 mL premix        2 g 200 mL/hr over 30 Minutes Intravenous Every 8 hours 02/02/24 0625 02/03/24 2238   02/01/24 1700  Vancomycin  (VANCOCIN ) 1,500 mg in sodium chloride  0.9 % 500 mL IVPB        1,500 mg 250 mL/hr over 120 Minutes Intravenous To Surgery 02/01/24 1650 02/01/24 1905   02/01/24 1700  ceFAZolin  (ANCEF ) IVPB 2g/100 mL premix  Status:  Discontinued        2 g 200 mL/hr over 30 Minutes Intravenous To Surgery 02/01/24 1650 02/02/24 0624   02/01/24 1700  ceFAZolin  (ANCEF ) IVPB 3g/150 mL premix        3 g 300 mL/hr over 30 Minutes Intravenous To Surgery 02/01/24 1650 02/02/24 0320       Assessment: Crews Gessel is 68 y.o. male with PMH HTN and HLD. Admitted with STEMI now with CGS. Found to have AAA requiring emergent surgical repair. Now s/p Bentall/AVR.  Goal of Therapy:  Heparin  level 0.3-0.5 units/ml Monitor platelets by anticoagulation protocol: Yes   Plan:  Start heparin  infusion at 1200 units/hr Check anti-Xa level in 6 hours and daily while on heparin  Continue to monitor H&H and platelets  Albino Alu, PharmD PGY2 Cardiology Pharmacy Resident 02/13/2024,9:27 AM   ADDENDUM 4/27 15:14 PM Heparin  level is undetectable at <0.1 on UFH 1200 units/hour. Patient remains stable with no signs of bleeding.  Increase UFH infusion to 1350 units/hour Check 6-hour heparin  level

## 2024-02-13 NOTE — Plan of Care (Signed)
 Problem: Education: Goal: Knowledge of General Education information will improve Description: Including pain rating scale, medication(s)/side effects and non-pharmacologic comfort measures Outcome: Progressing   Problem: Health Behavior/Discharge Planning: Goal: Ability to manage health-related needs will improve Outcome: Progressing   Problem: Clinical Measurements: Goal: Ability to maintain clinical measurements within normal limits will improve Outcome: Progressing Goal: Will remain free from infection Outcome: Progressing Goal: Diagnostic test results will improve Outcome: Progressing Goal: Respiratory complications will improve Outcome: Progressing Goal: Cardiovascular complication will be avoided Outcome: Progressing   Problem: Activity: Goal: Risk for activity intolerance will decrease Outcome: Progressing   Problem: Nutrition: Goal: Adequate nutrition will be maintained Outcome: Progressing   Problem: Coping: Goal: Level of anxiety will decrease Outcome: Progressing   Problem: Elimination: Goal: Will not experience complications related to bowel motility Outcome: Progressing Goal: Will not experience complications related to urinary retention Outcome: Progressing   Problem: Pain Managment: Goal: General experience of comfort will improve and/or be controlled Outcome: Progressing   Problem: Safety: Goal: Ability to remain free from injury will improve Outcome: Progressing   Problem: Skin Integrity: Goal: Risk for impaired skin integrity will decrease Outcome: Progressing   Problem: Education: Goal: Understanding of cardiac disease, CV risk reduction, and recovery process will improve Outcome: Progressing Goal: Individualized Educational Video(s) Outcome: Progressing   Problem: Activity: Goal: Ability to tolerate increased activity will improve Outcome: Progressing   Problem: Cardiac: Goal: Ability to achieve and maintain adequate cardiovascular  perfusion will improve Outcome: Progressing   Problem: Health Behavior/Discharge Planning: Goal: Ability to safely manage health-related needs after discharge will improve Outcome: Progressing   Problem: Education: Goal: Understanding of CV disease, CV risk reduction, and recovery process will improve Outcome: Progressing Goal: Individualized Educational Video(s) Outcome: Progressing   Problem: Activity: Goal: Ability to return to baseline activity level will improve Outcome: Progressing   Problem: Cardiovascular: Goal: Ability to achieve and maintain adequate cardiovascular perfusion will improve Outcome: Progressing Goal: Vascular access site(s) Level 0-1 will be maintained Outcome: Progressing   Problem: Health Behavior/Discharge Planning: Goal: Ability to safely manage health-related needs after discharge will improve Outcome: Progressing   Problem: Education: Goal: Will demonstrate proper wound care and an understanding of methods to prevent future damage Outcome: Progressing Goal: Knowledge of disease or condition will improve Outcome: Progressing Goal: Knowledge of the prescribed therapeutic regimen will improve Outcome: Progressing Goal: Individualized Educational Video(s) Outcome: Progressing   Problem: Activity: Goal: Risk for activity intolerance will decrease Outcome: Progressing   Problem: Cardiac: Goal: Will achieve and/or maintain hemodynamic stability Outcome: Progressing   Problem: Clinical Measurements: Goal: Postoperative complications will be avoided or minimized Outcome: Progressing   Problem: Respiratory: Goal: Respiratory status will improve Outcome: Progressing   Problem: Skin Integrity: Goal: Wound healing without signs and symptoms of infection Outcome: Progressing Goal: Risk for impaired skin integrity will decrease Outcome: Progressing   Problem: Urinary Elimination: Goal: Ability to achieve and maintain adequate renal perfusion  and functioning will improve Outcome: Progressing   Problem: Safety: Goal: Non-violent Restraint(s) Outcome: Progressing   Problem: Education: Goal: Ability to describe self-care measures that may prevent or decrease complications (Diabetes Survival Skills Education) will improve Outcome: Progressing Goal: Individualized Educational Video(s) Outcome: Progressing   Problem: Coping: Goal: Ability to adjust to condition or change in health will improve Outcome: Progressing   Problem: Fluid Volume: Goal: Ability to maintain a balanced intake and output will improve Outcome: Progressing   Problem: Health Behavior/Discharge Planning: Goal: Ability to identify and utilize available resources and services  will improve Outcome: Progressing Goal: Ability to manage health-related needs will improve Outcome: Progressing   Problem: Metabolic: Goal: Ability to maintain appropriate glucose levels will improve Outcome: Progressing

## 2024-02-13 NOTE — Progress Notes (Signed)
 [address]       [place] [address]             [phone]                 2 Days Post-Op Procedure(s) (LRB): RIGHT HEART CATH (N/A)   Events: No events Back on levo for low SBP _______________________________________________________________ Vitals: BP (!) 100/49   Pulse 85   Temp (!) 97.5 F (36.4 C)   Resp (!) 36   Ht 6\' 1"  (1.854 m)   Wt 131.9 kg   SpO2 (!) 87%   BMI 38.36 kg/m  Filed Weights   02/11/24 0500 02/12/24 0449 02/13/24 0335  Weight: 119.3 kg 128.3 kg 131.9 kg     - Neuro: sedated  - Cardiovascular: afib  Drips: amio, levo 10   CVP:  [15 mmHg-26 mmHg] 18 mmHg  - Pulm:  Vent Mode: PSV;CPAP FiO2 (%):  [40 %-50 %] 40 % Set Rate:  [20 bmp] 20 bmp Vt Set:  [570 mL] 570 mL PEEP:  [5 cmH20-8 cmH20] 8 cmH20 Pressure Support:  [8 cmH20-17 cmH20] 8 cmH20 Plateau Pressure:  [24 cmH20-30 cmH20] 24 cmH20  ABG    Component Value Date/Time   PHART 7.353 02/12/2024 2335   PCO2ART 41.7 02/12/2024 2335   PO2ART 70 (L) 02/12/2024 2335   HCO3 23.2 02/12/2024 2335   TCO2 24 02/12/2024 2335   ACIDBASEDEF 2.0 02/12/2024 2335   O2SAT 64 02/13/2024 0317    - Abd: ND - Extremity: warm  .Intake/Output      04/26 0701 04/27 0700 04/27 0701 04/28 0700   I.V. (mL/kg) 848.5 (6.4) 47.6 (0.4)   NG/GT 2390 170   IV Piggyback 799.9    Total Intake(mL/kg) 4038.3 (30.6) 217.6 (1.6)   Urine (mL/kg/hr) 2090 (0.7) 425 (0.8)   Stool     Total Output 2090 425   Net +1948.3 -207.4           _______________________________________________________________ Labs:    Latest Ref Rng & Units 02/13/2024    3:17 AM 02/12/2024   11:35 PM 02/12/2024    4:31 AM  CBC  WBC 4.0 - 10.5 K/uL 24.8   22.4   Hemoglobin 13.0 - 17.0 g/dL 7.1  7.8  7.9   Hematocrit 39.0 - 52.0 % 23.0  23.0  26.0   Platelets 150 - 400 K/uL 532   537       Latest Ref Rng & Units 02/13/2024    3:17 AM 02/12/2024   11:35 PM 02/12/2024    4:31 AM  CMP  Glucose 70 - 99  mg/dL 914   782   BUN 8 - 23 mg/dL 80   62   Creatinine 9.56 - 1.24 mg/dL 2.13   0.86   Sodium 578 - 145 mmol/L 149  152  153   Potassium 3.5 - 5.1 mmol/L 4.4  4.3  4.5   Chloride 98 - 111 mmol/L 117   119   CO2 22 - 32 mmol/L 23   23   Calcium  8.9 - 10.3 mg/dL 8.1   8.1     CXR: CT chest - bilateral infiltrates  _______________________________________________________________  Assessment and Plan: POD 11 s/p Ascending, arch, bBentall  Neuro: on propfol.  Weaning sedation.  Follows command intermittently CV: on amio, hx of afib.  On levo for hypotension.  Anticoagulated now, possible cardioversion   Pulm: vent sprints.  Will likely need tracheostomy Renal: creat up slightly.  Holding diuresis for hypotension GI: on tube feeds Heme: stable.  On plavix  for PCI.   ID: afebrile, but continued leukocytosis.  CT done showing bilateral infiltrates.  On abx Endo: SSI Dispo: continue ICU care.   Hilarie Lovely 02/13/2024 10:48 AM

## 2024-02-13 NOTE — Progress Notes (Addendum)
 eLink Physician-Brief Progress Note [name] DOB: [birth year] MRN: [id number]   Date of Service  02/13/2024  HPI/Events of Note  Patient initially presented with a STEMI and developed cardiogenic shock and now status post aortic dissection repair with Bentall.   ABG reviewed and essentially normal. Patient is hypotensive  eICU Interventions  Initiate norepinephrine  infusion via PICC   0321 -had 2 brief episodes of bradycardia down to the 30s  Reduced amiodarone  rate from 60->30  Intervention Category Intermediate Interventions: Hypotension - evaluation and management  Edmonia Gonser 02/13/2024, 1:02 AM

## 2024-02-13 NOTE — Progress Notes (Signed)
 PHARMACY - ANTICOAGULATION CONSULT NOTE  Pharmacy Consult for UFH IV Infusion Indication: atrial fibrillation  Allergies  Allergen Reactions   Bee Venom     Patient Measurements: Height: 6\' 1"  (185.4 cm) Weight: 131.9 kg (290 lb 12.6 oz) IBW/kg (Calculated) : 79.9 HEPARIN  DW (KG): 106  Vital Signs: Temp: 98.8 F (37.1 C) (04/27 2200) Temp Source: Bladder (04/27 2000) BP: 116/62 (04/27 2200) Pulse Rate: 76 (04/27 2200)  Labs: Recent Labs    02/11/24 0445 02/11/24 0923 02/12/24 0431 02/12/24 2335 02/13/24 0317 02/13/24 1418 02/13/24 2204  HGB 7.1*   < > 7.9* 7.8* 7.1*  --   --   HCT 22.7*   < > 26.0* 23.0* 23.0*  --   --   PLT 406*  --  537*  --  532*  --   --   HEPARINUNFRC  --   --   --   --   --  <0.10* 0.18*  CREATININE 1.38*  --  1.41*  --  1.51*  --   --    < > = values in this interval not displayed.    Estimated Creatinine Clearance: 66.7 mL/min (A) (by C-G formula based on SCr of 1.51 mg/dL (H)).   Medical History: Past Medical History:  Diagnosis Date   Hypertension     Medications:   Infusions:   amiodarone  30 mg/hr (02/13/24 2200)   feeding supplement (VITAL 1.5 CAL) 60 mL/hr at 02/13/24 2200   heparin  1,350 Units/hr (02/13/24 2200)   meropenem  (MERREM ) IV Stopped (02/13/24 2136)   norepinephrine  (LEVOPHED ) Adult infusion Stopped (02/13/24 0224)   propofol  (DIPRIVAN ) infusion 20 mcg/kg/min (02/13/24 2200)   PRN: acetaminophen  (TYLENOL ) oral liquid 160 mg/5 mL, albuterol , fentaNYL , ondansetron  (ZOFRAN ) IV, mouth rinse, sodium chloride  flush    Assessment: Dustin Davenport is 68 y.o. male with PMH HTN and HLD. Admitted with STEMI now with CGS. Found to have AAA requiring emergent surgical repair. Now s/p Bentall/AVR.  Pharmacy asked to start IV heparin  4/27 for afib.  Heparin  level remains low this evening at 0.18.  No overt bleeding or complications noted.  Goal of Therapy:  Heparin  level 0.3-0.5 units/ml Monitor platelets by anticoagulation  protocol: Yes   Plan:  Increase IV Heparin  to 1500 units/hr Check heparin  level in 8hrs. Daily heparin  level and CBC.  Joanell Mowers, Davey Erp, BCCP Clinical Pharmacist  02/13/2024 10:44 PM   [facility] pharmacy phone numbers are listed on [email or web address]

## 2024-02-13 NOTE — Progress Notes (Signed)
 EVENING ROUNDS NOTE :     [address]       [place] [address]             [phone]                 2 Days Post-Op Procedure(s) (LRB): RIGHT HEART CATH (N/A)   Total Length of Stay:  LOS: 12 days  Events:   No events today Back in sinus, off levo    BP (!) 109/57   Pulse 68   Temp (!) 97.5 F (36.4 C)   Resp (!) 32   Ht 6\' 1"  (1.854 m)   Wt 131.9 kg   SpO2 96%   BMI 38.36 kg/m   CVP:  [11 mmHg-81 mmHg] 12 mmHg  Vent Mode: PSV;CPAP FiO2 (%):  [40 %-50 %] 40 % Set Rate:  [20 bmp] 20 bmp Vt Set:  [570 mL] 570 mL PEEP:  [5 cmH20-8 cmH20] 8 cmH20 Pressure Support:  [8 cmH20-17 cmH20] 8 cmH20 Plateau Pressure:  [24 cmH20-30 cmH20] 24 cmH20   amiodarone  30 mg/hr (02/13/24 1700)   feeding supplement (VITAL 1.5 CAL) 60 mL/hr at 02/13/24 1700   heparin  1,350 Units/hr (02/13/24 1700)   meropenem  (MERREM ) IV Stopped (02/13/24 1320)   norepinephrine  (LEVOPHED ) Adult infusion Stopped (02/13/24 0224)   propofol  (DIPRIVAN ) infusion 19.978 mcg/kg/min (02/13/24 1700)    I/O last 3 completed shifts: In: 6723 [I.V.:1275.6; NG/GT:4070; IV Piggyback:1377.4] Out: 3550 [Urine:3550]      Latest Ref Rng & Units 02/13/2024    3:17 AM 02/12/2024   11:35 PM 02/12/2024    4:31 AM  CBC  WBC 4.0 - 10.5 K/uL 24.8   22.4   Hemoglobin 13.0 - 17.0 g/dL 7.1  7.8  7.9   Hematocrit 39.0 - 52.0 % 23.0  23.0  26.0   Platelets 150 - 400 K/uL 532   537        Latest Ref Rng & Units 02/13/2024    3:17 AM 02/12/2024   11:35 PM 02/12/2024    4:31 AM  BMP  Glucose 70 - 99 mg/dL 213   086   BUN 8 - 23 mg/dL 80   62   Creatinine 5.78 - 1.24 mg/dL 4.69   6.29   Sodium 528 - 145 mmol/L 149  152  153   Potassium 3.5 - 5.1 mmol/L 4.4  4.3  4.5   Chloride 98 - 111 mmol/L 117   119   CO2 22 - 32 mmol/L 23   23   Calcium  8.9 - 10.3 mg/dL 8.1   8.1     ABG    Component Value Date/Time   PHART 7.353 02/12/2024 2335   PCO2ART 41.7 02/12/2024 2335   PO2ART 70 (L) 02/12/2024  2335   HCO3 23.2 02/12/2024 2335   TCO2 24 02/12/2024 2335   ACIDBASEDEF 2.0 02/12/2024 2335   O2SAT 64 02/13/2024 0317       Starleen Eastern, MD 02/13/2024 5:16 PM

## 2024-02-13 NOTE — Progress Notes (Signed)
 Advanced Heart Failure Rounding Note  Cardiologist: None   Chief Complaint: STEMI> Cardiogenic shock> Now s/p aortic dissection repair + Bentall  Significant events:    - Echo 4/15: AAA measuring 5-5.7 cm. Followed by emergent TEE 4/15 which was significant for torrential aortic regurgitation and dissection flap extending from the aortic root to the proximal ascending aorta  - CT C/A/P: Showed acute type A dissection with a 5.5 cm ascending aortic aneurysm extending into arch. Dissection goes down to infrarenal aorta and extends up into innominate and left common carotid artery - 4/16 Underwent aortic dissection repair (Bentall/AVR and reimplantation of coronaries. Intra-op course c/b coagulopathy due to Effient  (PCI of RCA earlier in the day)).  - Extubated 4/18 - Re-intubated 4/19. Bronch with diffuse mucopurulent secretions  Subjective:    SBT this morning. WBC count mildly increased though fever curve better. CT ordered after discussion with CCM.  1 unit pRBC for persistent anemia, 7.1. Started diuresis with IV lasix  and 1x dose of metolazone  given hypernatremia.  Afib yesterday with post conversion pause. Amiodarone  decreased.   Objective:    Weight Range: 131.9 kg Body mass index is 38.36 kg/m.   Vital Signs:   Temp:  [97.3 F (36.3 C)-99 F (37.2 C)] 97.3 F (36.3 C) (04/27 1349) Pulse Rate:  [59-126] 68 (04/27 1349) Resp:  [6-36] 28 (04/27 1349) BP: (80-125)/(43-74) 111/51 (04/27 1345) SpO2:  [87 %-100 %] 100 % (04/27 1349) FiO2 (%):  [40 %-50 %] 40 % (04/27 0736) Weight:  [131.9 kg] 131.9 kg (04/27 0335) Last BM Date :  (small amount of stool in rectal tube)  Weight change: Filed Weights   02/11/24 0500 02/12/24 0449 02/13/24 0335  Weight: 119.3 kg 128.3 kg 131.9 kg   Intake/Output:  Intake/Output Summary (Last 24 hours) at 02/13/2024 1410 Last data filed at 02/13/2024 1346 Gross per 24 hour  Intake 3956.12 ml  Output 2815 ml  Net 1141.12 ml     Physical Exam   General:  Critically ill appearing HEENT: + ETT/CorTrak Cor: RRR, systolic murmur Lungs: ventilated breath sounds, mild rhonchi Abdomen: obese, soft, mildly distended.  Extremities: b/l upper and lower extremity edema, RUE PICC Neuro: Sedated   Telemetry   SR 80s, occasional afib, post conversion pauses  Labs    CBC Recent Labs    02/12/24 0431 02/12/24 2335 02/13/24 0317  WBC 22.4*  --  24.8*  HGB 7.9* 7.8* 7.1*  HCT 26.0* 23.0* 23.0*  MCV 92.5  --  93.1  PLT 537*  --  532*   Basic Metabolic Panel Recent Labs    91/47/82 0431 02/12/24 2335 02/13/24 0317  NA 153* 152* 149*  K 4.5 4.3 4.4  CL 119*  --  117*  CO2 23  --  23  GLUCOSE 212*  --  266*  BUN 62*  --  80*  CREATININE 1.41*  --  1.51*  CALCIUM  8.1*  --  8.1*  MG 2.4  --  2.6*  PHOS 4.5  --  4.2   Liver Function Tests Recent Labs    02/12/24 0431 02/13/24 0317  ALBUMIN  1.9* 2.1*   D-Dimer No results for input(s): "DDIMER" in the last 72 hours.  Hemoglobin A1C No results for input(s): "HGBA1C" in the last 72 hours.  Medications:    Scheduled Medications:  Chlorhexidine  Gluconate Cloth  6 each Topical Daily   clonazePAM   1 mg Per Tube Q8H   clopidogrel   75 mg Per Tube Daily   feeding  supplement (PROSource TF20)  60 mL Per Tube TID   free water   200 mL Per Tube Q4H   furosemide   80 mg Intravenous Q12H   insulin  aspart  0-20 Units Subcutaneous Q4H   insulin  glargine-yfgn  5 Units Subcutaneous BID   methocarbamol  (ROBAXIN ) injection  500 mg Intravenous Q8H   metoCLOPramide  (REGLAN ) injection  5 mg Intravenous Q8H   mouth rinse  15 mL Mouth Rinse Q2H   pantoprazole  (PROTONIX ) IV  40 mg Intravenous QHS   QUEtiapine   100 mg Per Tube BID   rosuvastatin  40 mg Per Tube Daily   sodium chloride  flush  10-40 mL Intracatheter Q12H   Infusions:  amiodarone  30 mg/hr (02/13/24 1300)   feeding supplement (VITAL 1.5 CAL) 60 mL/hr at 02/13/24 1300   heparin  1,200 Units/hr  (02/13/24 1300)   meropenem  (MERREM ) IV 200 mL/hr at 02/13/24 1300   norepinephrine  (LEVOPHED ) Adult infusion Stopped (02/13/24 0224)   propofol  (DIPRIVAN ) infusion 19.978 mcg/kg/min (02/13/24 1350)   PRN Medications: acetaminophen  (TYLENOL ) oral liquid 160 mg/5 mL, albuterol , fentaNYL , ondansetron  (ZOFRAN ) IV, mouth rinse, sodium chloride  flush  Patient Profile   Dustin Davenport is a 68 y.o. male with HTN and HLD. Admitted with STEMI now with CGS. Found to have AAA and taken emergently to the OR. Now s/p Bentall/AVR and reimplantation of coronaries.  Assessment/Plan   Acute type A aortic dissection  - Echo 4/15: AAA measuring 5-5.7 cm - Emergent TEE 4/15: significant for torrential aortic regurgitation and dissection flap extending from the aortic root to the proximal ascending aorta  - CT C/A/P: Showed acute type A dissection with a 5.5 cm ascending aortic aneurysm extending into arch. Dissection goes down to infrarenal aorta and extends up into innominate and left common carotid arteries  - Now s/p aortic dissection repair 02/01/24 (Bentall/AVR and reimplantation of coronaries.  2. CAD, Acute inferior STEMI - Admitted with STEMI. Post PCI found to also have aortic dissection - LHC with single vessel CAD involving the mid to distal RCA, successful PCI of the RCA with overlapping DES x 3. - No aspirin  with heparin  gtt - Continue plavix  75mg  daily - Continue atorvastatin  80mg  daily  3. Acute systolic HF ->  Cardiogenic shock - Post perfusion CGS.  - Echo 4/19: EF 25% RV moderate to severely decreased - Coox stable, on intermittent pressors. - Filling pressures normal on RHC 4/25 - Renal function and electrolytes improving, will restart IV diuresis today  4. Post-op hypoxic resp failure - extubated 4/18.  - re-intubated 4/19 - diffuse mucopurulent secretions on bronch 4/19. BAL GS: Mod WBC. Few gram variable rods. Rare yeast - Spiked a fever on 04/23. WBC count still elevated to 24  on meropenem , completed IV vanc. Infectious workup NGTD. CT scan ordered - Continue daily SBT/SAT  5. PAF with post-termination pauses PVCs - Patient with AF intra-op.  - Now back in NSR.  - Continues on IV amiodarone  whlie on pressors - Start IV heparin  given multiple conversions and prolonged bouts of afib - Drop aspirin   6. Hypokalemia/hypernatremia/hyperchloremia - Suspect diuretic induced RHC with normal filling pressures - Holding diuresis today s/p 1 dose of metolazone  yesterday - free water  flushes  7. AKI due to ATN/shock - Baseline SCr ~1.3-1.4 - SCr peaked at 3.2, now 1.51, suspect ATN with hypotension  8. ABLA post-op - Received 6uPRBCs, 6 plt, 4 FFP and 5 cryo intra-op.   - Hgb 7.1, 1 unit given today  CRITICAL CARE Performed by: Leia Pun  Kiano Terrien   Total critical care time: 38 minutes  Critical care time was exclusive of separately billable procedures and treating other patients.  Critical care was necessary to treat or prevent imminent or life-threatening deterioration.  Critical care was time spent personally by me on the following activities: development of treatment plan with patient and/or surrogate as well as nursing, discussions with consultants, evaluation of patient's response to treatment, examination of patient, obtaining history from patient or surrogate, ordering and performing treatments and interventions, ordering and review of laboratory studies, ordering and review of radiographic studies, pulse oximetry and re-evaluation of patient's condition.      Length of Stay: 12  [name], MD  02/13/2024, 2:10 PM  Advanced Heart Failure Team Pager [phone] (M-F; 7a - 5p)  Please contact [facility] for night-coverage after hours (5p -7a ) and weekends on [email or web address]

## 2024-02-13 NOTE — Progress Notes (Signed)
 Dustin Davenport, MRN:  [id number], DOB:  [birth year], LOS: 12 ADMISSION DATE:  02/01/2024, CONSULTATION DATE:  02/03/24 REFERRING MD:  Sherene Dilling, CHIEF COMPLAINT:  chest pain   History of Present Illness:  68 year old man w/ hx of HTN, obesity who presented on 4/15 with sudden onset chest pain.  EKG showing inferior STEMI and went to cath lab for RCA DES x 3.  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  Taken emergently to OR for Bentall+ ascending/arch replacement.  Procedure complicated by coagulopathy related to antiplatelet therapy from recent stent, multiple liters of blood products given.  Returned to ICU 4/16 on vent and PCCM consulted to assist with vent wean on 4/17.  Currently heavily sedated; attempts at weaning resulted in agitation without ability to redirect.  Pertinent  Medical History   Past Medical History:  Diagnosis Date   Hypertension     Significant Hospital Events: Including procedures, antibiotic start and stop dates in addition to other pertinent events   4/16 RCA stent then Bentall, aortic arch replacement 4/18 extubated 4/19 reintubated in afternoon for aspiration, bronch, Delirious, low grade fevers, expressive aphasia 4/20 but mental status precludes extubation 4/21 started spiking fever again, meropenem  switched back from Zosyn , on vancomycin  as well 02/07/22 failed spontaneous breathing trial 4/24 Tachypnea, fever. Resp culture sent  4/25 underwent right heart cath showing normal filling pressure 4/26 remained in A-fib, became afebrile, Precedex  was switched to low-dose propofol   Interim History / Subjective:  Overnight patient went in and out of A-fib and then became bradycardic leading to hypotension requiring albumin  This morning tolerating spontaneous breathing trial 8/8 but tachypneic Remain afebrile but white count is going up  Objective   Blood pressure (!)  105/54, pulse 67, temperature (!) 97.5 F (36.4 C), resp. rate (!) 30, height 6\' 1"  (1.854 m), weight 131.9 kg, SpO2 96%. CVP:  [12 mmHg-26 mmHg] 22 mmHg  Vent Mode: PRVC FiO2 (%):  [40 %-50 %] 50 % Set Rate:  [20 bmp] 20 bmp Vt Set:  [570 mL] 570 mL PEEP:  [5 cmH20-8 cmH20] 8 cmH20 Pressure Support:  [10 cmH20-17 cmH20] 17 cmH20 Plateau Pressure:  [24 cmH20-30 cmH20] 24 cmH20   Intake/Output Summary (Last 24 hours) at 02/13/2024 0740 Last data filed at 02/13/2024 0700 Gross per 24 hour  Intake 4036.79 ml  Output 2090 ml  Net 1946.79 ml   Filed Weights   02/11/24 0500 02/12/24 0449 02/13/24 0335  Weight: 119.3 kg 128.3 kg 131.9 kg    Examination: General: Crtitically ill-appearing elderly obese male, orally intubated HEENT: /AT, eyes anicteric.  ETT with thick whitish secretions and Cortrak in place Neuro: Opens eyes with to vocal stimuli, intermittently following commands, on low-dose propofol  Chest: Central sternotomy and right-sided chest CP incision looks clean and dry, tachypneic, bilateral rhonchorous breath sounds all over coarse breath sounds, no wheezes or rhonchi Heart: Regular rate and rhythm, no murmurs or gallops Abdomen: Soft, nondistended, bowel sounds present Skin: No rash   Cr 1.5 Na 149 K 4.4 Phos 4.2 Mag 2.6 WBC 24.8 H/H 7.1/23 BAL: Gram variable rods and moderate yeast Coox 64%  I/O net +1.9 L in last 24 hours, with UO 2 L Echo LVEF 35% with RV dilatation and reduced function  Resolved Hospital Problem list   Constipation Shock combined cardiogenic/septic Hypokalemia/hypophosphatemia Shock liver, improving Perioperative coagulopathy  Assessment & Plan:  Acute inferior wall STEMI  status post PCI and DES Type A aortic dissection with s/p emergent aortic reconstruction and aortic root replacement Paroxysmal A-fib with RVR Acute respiratory failure with hypoxia and hypercapnia Bilateral multifocal pneumonia with Gram variable rods Acute  kidney injury due to ischemic ATN from shock, improving Hypernatremia due to aggressive diuresis Acute encephalopathy, likely septic Perioperative acute blood loss anemia Obesity Hyperglycemia in the setting of acute illness  Continue Plavix  and statin Aspirin  was stopped, started on IV heparin  infusion in the setting of paroxysmal A-fib, for stroke prophylaxis TCTS following Overnight he converted to sinus rhythm, became bradycardic and hypotensive, amiodarone  was decreased from 60 mg to 30 Required 2 bottles of albumin  with stabilization of blood pressure, not on vasopressors Continue lung protective ventilation Tolerating spontaneous breathing trial 8/8 but tachypneic He became afebrile but white count continued to trend up Will get CT chest abdomen and pelvis to rule out occult infection Has been getting meropenem  Vancomycin  was stopped after completing 7 days therapy VAP prevention bundle in place PAD protocol with low dose propofol , currently on 10 mics Serum creatinine trended up to 1.5 Will diurese him today with Lasix  and metolazone  Monitor intake and output Continue free water  flushes He remained net positive for last few days Monitor serum sodium H&H is stable between 7-8, closely monitor Hemoglobin A1c is 5.3, patient is hyperglycemic Started on Lantus  5 units twice daily Continue sliding scale insulin  with CBG goal 140-180  Best Practice (right click and "Reselect all SmartList Selections" daily)   Diet/type: Tube feeds DVT prophylaxis per TCTS Pressure ulcer(s): N/A GI prophylaxis: PPI Lines: Central line Foley:  Yes, and it is still needed Code Status:  full code Last date of multidisciplinary goals of care discussion: 4/25: Patient's wife was updated at bedside, stated that he is failing breathing trial, may require tracheostomy, will watch over the weekend, but high likelihood he will need tracheostomy next week  The patient is critically ill due to acute  respiratory failure with hypoxia and hypercapnia/septic shock. critical care was necessary to treat or prevent imminent or life-threatening deterioration.  Critical care was time spent personally by me on the following activities: development of treatment plan with patient and/or surrogate as well as nursing, discussions with consultants, evaluation of patient's response to treatment, examination of patient, obtaining history from patient or surrogate, ordering and performing treatments and interventions, ordering and review of laboratory studies, ordering and review of radiographic studies, pulse oximetry, re-evaluation of patient's condition and participation in multidisciplinary rounds.   During this encounter critical care time was devoted to patient care services described in this note for 38 minutes.     Trevor Fudge, MD [facility] See Amion for pager If no response to pager, please call [phone] until 7pm After 7pm, Please call E-link [phone]

## 2024-02-14 ENCOUNTER — Inpatient Hospital Stay (HOSPITAL_COMMUNITY)

## 2024-02-14 DIAGNOSIS — I2111 ST elevation (STEMI) myocardial infarction involving right coronary artery: Secondary | ICD-10-CM | POA: Diagnosis not present

## 2024-02-14 DIAGNOSIS — E876 Hypokalemia: Secondary | ICD-10-CM | POA: Diagnosis not present

## 2024-02-14 DIAGNOSIS — D72829 Elevated white blood cell count, unspecified: Secondary | ICD-10-CM

## 2024-02-14 DIAGNOSIS — R609 Edema, unspecified: Secondary | ICD-10-CM

## 2024-02-14 DIAGNOSIS — J9601 Acute respiratory failure with hypoxia: Secondary | ICD-10-CM | POA: Diagnosis not present

## 2024-02-14 DIAGNOSIS — I5021 Acute systolic (congestive) heart failure: Secondary | ICD-10-CM | POA: Diagnosis not present

## 2024-02-14 DIAGNOSIS — J9602 Acute respiratory failure with hypercapnia: Secondary | ICD-10-CM | POA: Diagnosis not present

## 2024-02-14 DIAGNOSIS — I480 Paroxysmal atrial fibrillation: Secondary | ICD-10-CM | POA: Diagnosis not present

## 2024-02-14 LAB — POCT I-STAT EG7
Acid-Base Excess: 1 mmol/L (ref 0.0–2.0)
Bicarbonate: 26.2 mmol/L (ref 20.0–28.0)
Calcium, Ion: 1.17 mmol/L (ref 1.15–1.40)
HCT: 24 % — ABNORMAL LOW (ref 39.0–52.0)
Hemoglobin: 8.2 g/dL — ABNORMAL LOW (ref 13.0–17.0)
O2 Saturation: 59 %
Potassium: 3.9 mmol/L (ref 3.5–5.1)
Sodium: 158 mmol/L — ABNORMAL HIGH (ref 135–145)
TCO2: 28 mmol/L (ref 22–32)
pCO2, Ven: 43.6 mmHg — ABNORMAL LOW (ref 44–60)
pH, Ven: 7.388 (ref 7.25–7.43)
pO2, Ven: 31 mmHg — CL (ref 32–45)

## 2024-02-14 LAB — CBC
HCT: 22.7 % — ABNORMAL LOW (ref 39.0–52.0)
Hemoglobin: 7.1 g/dL — ABNORMAL LOW (ref 13.0–17.0)
MCH: 28.7 pg (ref 26.0–34.0)
MCHC: 31.3 g/dL (ref 30.0–36.0)
MCV: 91.9 fL (ref 80.0–100.0)
Platelets: 621 10*3/uL — ABNORMAL HIGH (ref 150–400)
RBC: 2.47 MIL/uL — ABNORMAL LOW (ref 4.22–5.81)
RDW: 20.6 % — ABNORMAL HIGH (ref 11.5–15.5)
WBC: 29.5 10*3/uL — ABNORMAL HIGH (ref 4.0–10.5)
nRBC: 11.9 % — ABNORMAL HIGH (ref 0.0–0.2)

## 2024-02-14 LAB — RENAL FUNCTION PANEL
Albumin: 2 g/dL — ABNORMAL LOW (ref 3.5–5.0)
Anion gap: 12 (ref 5–15)
BUN: 88 mg/dL — ABNORMAL HIGH (ref 8–23)
CO2: 24 mmol/L (ref 22–32)
Calcium: 8.1 mg/dL — ABNORMAL LOW (ref 8.9–10.3)
Chloride: 113 mmol/L — ABNORMAL HIGH (ref 98–111)
Creatinine, Ser: 1.6 mg/dL — ABNORMAL HIGH (ref 0.61–1.24)
GFR, Estimated: 47 mL/min — ABNORMAL LOW (ref >60)
Glucose, Bld: 280 mg/dL — ABNORMAL HIGH (ref 70–99)
Phosphorus: 3.6 mg/dL (ref 2.5–4.6)
Potassium: 3.2 mmol/L — ABNORMAL LOW (ref 3.5–5.1)
Sodium: 149 mmol/L — ABNORMAL HIGH (ref 135–145)

## 2024-02-14 LAB — GLUCOSE, CAPILLARY
Glucose-Capillary: 258 mg/dL — ABNORMAL HIGH (ref 70–99)
Glucose-Capillary: 177 mg/dL — ABNORMAL HIGH (ref 70–99)
Glucose-Capillary: 198 mg/dL — ABNORMAL HIGH (ref 70–99)
Glucose-Capillary: 216 mg/dL — ABNORMAL HIGH (ref 70–99)
Glucose-Capillary: 225 mg/dL — ABNORMAL HIGH (ref 70–99)
Glucose-Capillary: 190 mg/dL — ABNORMAL HIGH (ref 70–99)

## 2024-02-14 LAB — COOXEMETRY PANEL
Carboxyhemoglobin: 1.3 % (ref 0.5–1.5)
Methemoglobin: 0.7 % (ref 0.0–1.5)
O2 Saturation: 71.7 %
Total hemoglobin: 7.3 g/dL — ABNORMAL LOW (ref 12.0–16.0)

## 2024-02-14 LAB — ECHO TEE

## 2024-02-14 LAB — MAGNESIUM: Magnesium: 2.6 mg/dL — ABNORMAL HIGH (ref 1.7–2.4)

## 2024-02-14 LAB — HEPARIN LEVEL (UNFRACTIONATED): Heparin Unfractionated: 0.21 [IU]/mL — ABNORMAL LOW (ref 0.30–0.70)

## 2024-02-14 MED ORDER — POLYETHYLENE GLYCOL 3350 17 G PO PACK
17.0000 g | PACK | Freq: Every day | ORAL | Status: DC
  Administered 2024-02-14: 17 g
  Filled 2024-02-14: qty 1

## 2024-02-14 MED ORDER — VITAL 1.5 CAL PO LIQD
1000.0000 mL | ORAL | Status: DC
  Administered 2024-02-14 – 2024-02-20 (×7): 1000 mL
  Filled 2024-02-14 (×3): qty 1000

## 2024-02-14 MED ORDER — METOLAZONE 5 MG PO TABS
5.0000 mg | ORAL_TABLET | Freq: Once | ORAL | Status: AC
Start: 2024-02-14 — End: 2024-02-14
  Administered 2024-02-14: 5 mg
  Filled 2024-02-14: qty 1

## 2024-02-14 MED ORDER — POTASSIUM CHLORIDE 20 MEQ PO PACK
40.0000 meq | PACK | ORAL | Status: AC
  Administered 2024-02-14 (×2): 40 meq
  Filled 2024-02-14 (×2): qty 2

## 2024-02-14 MED ORDER — SODIUM CHLORIDE 0.9 % IV SOLN
100.0000 mg | INTRAVENOUS | Status: DC
  Administered 2024-02-14 – 2024-02-16 (×3): 100 mg via INTRAVENOUS
  Filled 2024-02-14 (×4): qty 5

## 2024-02-14 MED ORDER — NOREPINEPHRINE 16 MG/250ML-% IV SOLN
0.0000 ug/min | INTRAVENOUS | Status: DC
Start: 2024-02-14 — End: 2024-02-28
  Administered 2024-02-14: 3 ug/min via INTRAVENOUS
  Administered 2024-02-16: 2 ug/min via INTRAVENOUS
  Administered 2024-02-17: 8 ug/min via INTRAVENOUS
  Administered 2024-02-18: 23 ug/min via INTRAVENOUS
  Administered 2024-02-19: 11 ug/min via INTRAVENOUS
  Administered 2024-02-22 – 2024-02-27 (×3): 2 ug/min via INTRAVENOUS
  Filled 2024-02-14 (×8): qty 250

## 2024-02-14 MED ORDER — FUROSEMIDE 10 MG/ML IJ SOLN
80.0000 mg | Freq: Once | INTRAMUSCULAR | Status: AC
  Administered 2024-02-14: 80 mg via INTRAVENOUS
  Filled 2024-02-14: qty 8

## 2024-02-14 MED ORDER — INSULIN GLARGINE-YFGN 100 UNIT/ML ~~LOC~~ SOLN
12.0000 [IU] | Freq: Two times a day (BID) | SUBCUTANEOUS | Status: DC
  Administered 2024-02-14 – 2024-02-17 (×8): 12 [IU] via SUBCUTANEOUS
  Filled 2024-02-14 (×11): qty 0.12

## 2024-02-14 MED ORDER — POTASSIUM CHLORIDE 20 MEQ PO PACK
40.0000 meq | PACK | Freq: Once | ORAL | Status: AC
  Administered 2024-02-14: 40 meq
  Filled 2024-02-14: qty 2

## 2024-02-14 MED ORDER — FENTANYL BOLUS VIA INFUSION: 100.0000 ug | Freq: Once | INTRAVENOUS | Status: DC

## 2024-02-14 MED ORDER — PROSOURCE TF20 ENFIT COMPATIBL EN LIQD
60.0000 mL | Freq: Four times a day (QID) | ENTERAL | Status: DC
  Administered 2024-02-14 – 2024-02-24 (×39): 60 mL
  Filled 2024-02-14 (×39): qty 60

## 2024-02-14 MED ORDER — VANCOMYCIN HCL 1750 MG/350ML IV SOLN
1750.0000 mg | INTRAVENOUS | Status: DC
  Filled 2024-02-14: qty 350

## 2024-02-14 MED ORDER — INSULIN ASPART 100 UNIT/ML IJ SOLN
2.0000 [IU] | INTRAMUSCULAR | Status: DC
  Administered 2024-02-14 – 2024-02-15 (×7): 2 [IU] via SUBCUTANEOUS

## 2024-02-14 MED ORDER — VANCOMYCIN HCL 2000 MG/400ML IV SOLN
2000.0000 mg | Freq: Once | INTRAVENOUS | Status: AC
  Administered 2024-02-14: 2000 mg via INTRAVENOUS
  Filled 2024-02-14: qty 400

## 2024-02-14 MED ORDER — POTASSIUM CHLORIDE 20 MEQ PO PACK
20.0000 meq | PACK | ORAL | Status: DC
  Administered 2024-02-14: 20 meq
  Filled 2024-02-14: qty 1

## 2024-02-14 NOTE — Progress Notes (Signed)
 PHARMACY ANTIBIOTIC CONSULT NOTE   Daniil R Vandermeer a 68 y.o. male admitted on 4/15 s/p type A dissection and Bentall repair and PCI/DES to RCA. Patient has been on broad spectrum antibiotics with meropenem >zoysn wbc increased and changed back to meropenem  4/24.WBC trended up 30, cxr with pneumonia, Tm99, Cr1.6 crcl 82ml/min  Pharmacy has been re-consulted for Vancomycin  dosing - last dose 4/25.   Plan: Meropenem  1gm IV q8h Vancomycin  2gm IV x1 then resume 1750mg  IV q24h est AUC 580 > dec 1500mg  q24 - with increasing Cr 1.7 Monitor renal function, clinical status, de-escalation, C/S, levels as indicated   Allergies:  Allergies  Allergen Reactions   Bee Venom     Filed Weights   02/12/24 0449 02/13/24 0335 02/14/24 0331  Weight: 128.3 kg (282 lb 13.6 oz) 131.9 kg (290 lb 12.6 oz) 130.3 kg (287 lb 4.2 oz)       Latest Ref Rng & Units 02/14/2024    3:58 AM 02/13/2024    3:17 AM 02/12/2024   11:35 PM  CBC  WBC 4.0 - 10.5 K/uL 29.5  24.8    Hemoglobin 13.0 - 17.0 g/dL 7.1  7.1  7.8   Hematocrit 39.0 - 52.0 % 22.7  23.0  23.0   Platelets 150 - 400 K/uL 621  532      Antibiotics Given (last 72 hours)     Date/Time Action Medication Dose Rate   02/11/24 1955 New Bag/Given   meropenem  (MERREM ) 1 g in sodium chloride  0.9 % 100 mL IVPB 1 g 200 mL/hr   02/11/24 2037 New Bag/Given   vancomycin  (VANCOREADY) IVPB 1750 mg/350 mL 1,750 mg 175 mL/hr   02/12/24 0457 New Bag/Given   meropenem  (MERREM ) 1 g in sodium chloride  0.9 % 100 mL IVPB 1 g 200 mL/hr   02/12/24 1228 New Bag/Given   meropenem  (MERREM ) 1 g in sodium chloride  0.9 % 100 mL IVPB 1 g 200 mL/hr   02/12/24 1952 New Bag/Given   meropenem  (MERREM ) 1 g in sodium chloride  0.9 % 100 mL IVPB 1 g 200 mL/hr   02/13/24 0333 New Bag/Given   meropenem  (MERREM ) 1 g in sodium chloride  0.9 % 100 mL IVPB 1 g 200 mL/hr   02/13/24 1250 New Bag/Given   meropenem  (MERREM ) 1 g in sodium chloride  0.9 % 100 mL IVPB 1 g 200 mL/hr   02/13/24 2106 New  Bag/Given   meropenem  (MERREM ) 1 g in sodium chloride  0.9 % 100 mL IVPB 1 g 200 mL/hr   02/14/24 0407 New Bag/Given   meropenem  (MERREM ) 1 g in sodium chloride  0.9 % 100 mL IVPB 1 g 200 mL/hr   02/14/24 1102 New Bag/Given   vancomycin  (VANCOREADY) IVPB 2000 mg/400 mL 2,000 mg 200 mL/hr   02/14/24 1312 New Bag/Given   meropenem  (MERREM ) 1 g in sodium chloride  0.9 % 100 mL IVPB 1 g 200 mL/hr       Antimicrobials this admission: Ancef  periop 4/15 - 4/17 Vanc 4/19 >4/25 restart 4/28 Merrem  4/19 > 4/21 restarted 4/24. Zosyn  4/21 >4/23  Micafungin 4/28>   Microbiology results: 4/15 MRSA: neg (surgical PCR +MSSA) 4/19 BAL: gm variable rod, rare yeast pseudohyphae 4/19 MRSA: neg SA + 4/24 TA candida albicans    [name] Pharm.D. CPP, BCPS Clinical Pharmacist [phone] 02/14/2024 1:29 PM   Please check AMION for all [facility] phone numbers After 10:00 PM, call Main Pharmacy [phone]

## 2024-02-14 NOTE — Progress Notes (Signed)
 PHARMACY - ANTICOAGULATION CONSULT NOTE  Pharmacy Consult for UFH IV Infusion Indication: atrial fibrillation / LLE DVT  Allergies  Allergen Reactions   Bee Venom     Patient Measurements: Height: 6\' 1"  (185.4 cm) Weight: 130.3 kg (287 lb 4.2 oz) IBW/kg (Calculated) : 79.9 HEPARIN  DW (KG): 106  Vital Signs: Temp: 99.1 F (37.3 C) (04/28 1300) Temp Source: Bladder (04/28 1300) BP: 104/55 (04/28 1300) Pulse Rate: 74 (04/28 1300)  Labs: Recent Labs    02/12/24 0431 02/12/24 2335 02/13/24 0317 02/13/24 1418 02/13/24 2204 02/14/24 0358 02/14/24 0653  HGB 7.9* 7.8* 7.1*  --   --  7.1*  --   HCT 26.0* 23.0* 23.0*  --   --  22.7*  --   PLT 537*  --  532*  --   --  621*  --   HEPARINUNFRC  --   --   --  <0.10* 0.18*  --  0.21*  CREATININE 1.41*  --  1.51*  --   --  1.60*  --     Estimated Creatinine Clearance: 62.6 mL/min (A) (by C-G formula based on SCr of 1.6 mg/dL (H)).   Medical History: Past Medical History:  Diagnosis Date   Hypertension     Medications:   Infusions:   amiodarone  30 mg/hr (02/14/24 1300)   feeding supplement (VITAL 1.5 CAL) 60 mL/hr at 02/14/24 1300   heparin  1,500 Units/hr (02/14/24 1300)   meropenem  (MERREM ) IV Stopped (02/14/24 0438)   micafungin (MYCAMINE) 100 mg in sodium chloride  0.9 % 100 mL IVPB Stopped (02/14/24 1030)   norepinephrine  (LEVOPHED ) Adult infusion 3 mcg/min (02/14/24 1300)   propofol  (DIPRIVAN ) infusion 20 mcg/kg/min (02/14/24 1300)   PRN: acetaminophen  (TYLENOL ) oral liquid 160 mg/5 mL, albuterol , fentaNYL , ondansetron  (ZOFRAN ) IV, mouth rinse, sodium chloride  flush    Assessment: Kristian Patel is 68 y.o. male with PMH HTN and HLD. Admitted with STEMI now with CGS. Found to have AAA requiring emergent surgical repair. Now s/p Bentall/AVR.  Pharmacy asked to start IV heparin  4/27 for afib.  Heparin  level  0.21 slightly < goal on heparin  drip 1500 uts/hr.  No overt bleeding or complications noted. LLE DVT found on  doppler 4/28  Goal of Therapy:  Heparin  level 0.3-0.5 units/ml Monitor platelets by anticoagulation protocol: Yes   Plan:  Increase IV Heparin  to 1600 units/hr Daily heparin  level and CBC. Monitor s/s bleeding    [name] Pharm.D. CPP, BCPS Clinical Pharmacist [phone] 02/14/2024 1:11 PM    [facility] pharmacy phone numbers are listed on [email or web address]

## 2024-02-14 NOTE — Progress Notes (Signed)
 Patient ID: Dustin Davenport, male   DOB: 1956-07-14, 68 y.o.   MRN: [id number]  TCTS Evening Rounds:  Hemodynamically stable in sinus rhythm. CVP 9.  Remains on vent.   Diuresing well.

## 2024-02-14 NOTE — Progress Notes (Signed)
 Initial Nutrition Assessment  DOCUMENTATION CODES:   Obesity unspecified  INTERVENTION:   Tube Feeding via Cortrak:  Decrease Vital 1.5 at 55 ml/hr Increase Pro-Source TF20 60 mL QID TF at goal provides 2300 kcals, 169 g  of protein and 1003 mL of free water    Continue current free water  flushes per MD; free water  flush of 200 mL q 4 hours plus current TF provide 2203 mL of free water  in 24 hours   Receiving additional lipid calories via propofol   NUTRITION DIAGNOSIS:  Increased nutrient needs related to acute illness as evidenced by estimated needs.  Being addressed via TF   GOAL:   Patient will meet greater than or equal to 90% of their needs  Met via TF  MONITOR:   Vent status, Labs, I & O's, Diet advancement, TF tolerance  REASON FOR ASSESSMENT:   Ventilator    ASSESSMENT:   68 yo male admitted with STEMI requiring extensive RCA stenting; post op chest pain continued and work-up revealed acute type A dissection with ascending aortic aneurysm and taking to OR emergent repair. PMH includes HTN, HLD  4/15 Cath Lab for RCA Stent, post op acute type A dissection  4/16 OR early AM for repair plus Bentall 4/18 Extubated 4/19 Re-Intubated, Bronch  4/21 TF started 4/23 Cortrak placed  Pt remains on vent support Propofol  14.3 ml/hr (378 kcals in 24 hours at current rate) Levophed  at 3  Vital 1.5 at 60 ml/hr via Cortrak, Pro-Source TF20 60 mL TID  Free water  of 200 mL q 4 hours since 4/24, sodium/chloride have improved since initiation but remains elevated  UOP 4.6 L in 24 hours with 2.15 L already thus far today. Edema still present on exam, current dry wt unknown  Weight has fluctuated up and down since admission Current Wt: 103.3 kg Admit Wt: Lowest Wt: 119.3 kg (4/25) Highest Wt: 134.9 kg (4/17)  No pressure injuries, skin breakdown per RN skin assessment  Labs: Sodium 149 (H) Potassium 3.2 (L) Chloride 113 (H) BUN 88 Creatinine 1.60 Phosphorus  3.6 (wdl) Magnesium  2.6 (wdl)  Meds:  SS Novolog , novolog  q 4 hours Semglee  BID Reglan    Diet Order:   Diet Order     None       EDUCATION NEEDS:   Not appropriate for education at this time  Skin:  Skin Assessment: Skin Integrity Issues: Skin Integrity Issues:: Incisions Incisions: Chest incisions  Last BM:  4/27 small amount of stool  Height:   Ht Readings from Last 1 Encounters:  02/05/24 6\' 1"  (1.854 m)    Weight:   Wt Readings from Last 1 Encounters:  02/14/24 130.3 kg    BMI:  Body mass index is 37.9 kg/m.  Estimated Nutritional Needs:   Kcal:  2400-2600 kcal  Protein:  150-170 g  Fluid:  >2L/day   Norvel Beer MS, RDN, LDN, CNSC Registered Dietitian 3 Clinical Nutrition RD Inpatient Contact Info in Amion

## 2024-02-14 NOTE — Progress Notes (Signed)
 Upper extremity venous duplex completed. Please see CV Procedures for preliminary results.  Initial findings reported to Cesario Collum, Charity fundraiser.  Estanislao Heimlich, RVT 02/14/24 3:25 PM

## 2024-02-14 NOTE — Progress Notes (Signed)
 3 Days Post-Op Procedure(s) (LRB): RIGHT HEART CATH (N/A) Subjective:  Stable weekend but failed weaning trials due to tachypnea and agitation. He had CT CAP yesterday which I reviewed. The aortic repair looks stable with small residual false lumen in descending aorta. Innominate and left CCA bypasses patent. Flow in left subclavian looks normal. Celiac, SMA and renals look patent. There is some fluid around the ascending graft which is expected and small pericardial effusion that is not unusual. Sternum looks intact. There is bilateral LL ASD/atelectasis and small bilateral pleural effusions. No obvious source of leukocytosis except for lungs.  Objective: Vital signs in last 24 hours: Temp:  [97.3 F (36.3 C)-99.5 F (37.5 C)] 98.4 F (36.9 C) (04/28 0645) Pulse Rate:  [56-127] 84 (04/28 0645) Cardiac Rhythm: Normal sinus rhythm;Atrial fibrillation (04/27 2000) Resp:  [6-38] 35 (04/28 0645) BP: (77-125)/(41-80) 101/52 (04/28 0645) SpO2:  [87 %-100 %] 92 % (04/28 0645) FiO2 (%):  [40 %] 40 % (04/28 0359) Weight:  [130.3 kg] 130.3 kg (04/28 0331)  Hemodynamic parameters for last 24 hours: CVP:  [8 mmHg-81 mmHg] 10 mmHg  Intake/Output from previous day: 04/27 0701 - 04/28 0700 In: 4363.4 [I.V.:983.5; NG/GT:3080; IV Piggyback:299.9] Out: 4605 [Urine:4605] Intake/Output this shift: No intake/output data recorded.  General appearance: sedated on vent.  Neurologic: unable to assess Heart: regular rate and rhythm Lungs: clear to auscultation bilaterally Abdomen: soft, non-tender; bowel sounds normal Extremities: anasarca Wound: incisions healing well  Lab Results: Recent Labs    02/13/24 0317 02/14/24 0358  WBC 24.8* 29.5*  HGB 7.1* 7.1*  HCT 23.0* 22.7*  PLT 532* 621*   BMET:  Recent Labs    02/13/24 0317 02/14/24 0358  NA 149* 149*  K 4.4 3.2*  CL 117* 113*  CO2 23 24  GLUCOSE 266* 280*  BUN 80* 88*  CREATININE 1.51* 1.60*  CALCIUM  8.1* 8.1*    PT/INR: No  results for input(s): "LABPROT", "INR" in the last 72 hours. ABG    Component Value Date/Time   PHART 7.353 02/12/2024 2335   HCO3 23.2 02/12/2024 2335   TCO2 24 02/12/2024 2335   ACIDBASEDEF 2.0 02/12/2024 2335   O2SAT 71.7 02/14/2024 0356   CBG (last 3)  Recent Labs    02/13/24 1953 02/13/24 2340 02/14/24 0356  GLUCAP 204* 242* 258*    Assessment/Plan:  POD 13 Hemodynamics remain fairly stable. On and off low dose NE for BP support. RHC Friday was unremarkable with normal CI and essentially normal filling pressures.  Intermittant atrial fib on IV amio. Heparin  drip.  VDRF: will likely need trach to get off sedation and vent. I think this can be done whenever CCM feels it is appropriate.  AKI: creat stable with rising BUN. -242 cc for 24 hrs. Wt down 4 lbs from yesterday but fluctuates a lot with bed scales.  Afebrile but increasing leukocytosis on MERREM  and micafungin. No clear source on CTA except lungs.   LOS: 13 days    Bartley Lightning 02/14/2024

## 2024-02-14 NOTE — Progress Notes (Addendum)
 Advanced Heart Failure Rounding Note  Cardiologist: None   Chief Complaint: STEMI> Cardiogenic shock> Now s/p aortic dissection repair + Bentall  Patient Profile   Dustin Davenport is a 68 y.o. male with HTN and HLD. Admitted with STEMI now with CGS. Found to have AAA and taken emergently to the OR. Now s/p Bentall/AVR and reimplantation of coronaries.  Significant events:    - Echo 4/15: AAA measuring 5-5.7 cm. Followed by emergent TEE 4/15 which was significant for torrential aortic regurgitation and dissection flap extending from the aortic root to the proximal ascending aorta  - CT C/A/P: Showed acute type A dissection with a 5.5 cm ascending aortic aneurysm extending into arch. Dissection goes down to infrarenal aorta and extends up into innominate and left common carotid artery - 4/16 Underwent aortic dissection repair (Bentall/AVR and reimplantation of coronaries. Intra-op course c/b coagulopathy due to Effient  (PCI of RCA earlier in the day)).  - Extubated 4/18 - Re-intubated 4/19. Bronch with diffuse mucopurulent secretions - 4/26 Afib with post conversion pause. Amiodarone  decreased.   Subjective:    Intubated and sedated.   Co-ox 72% on NE 3. 4.6L in UOP w/ IV Lasix  and metolazone  yesterday. SCr 1.4>>1.5>>1.6 K 3.2   CVP 9. C/w edema on exam.    In NSR. No further bradycardia.   WBC continue to rise despite abx. WBC 30K. CT of C/A/P yesterday w/ small postoperative seroma around aortic root/graft and possible PNA  Objective:    Weight Range: 130.3 kg Body mass index is 37.9 kg/m.   Vital Signs:   Temp:  [97.3 F (36.3 C)-99.5 F (37.5 C)] 98.4 F (36.9 C) (04/28 0645) Pulse Rate:  [56-127] 84 (04/28 0645) Resp:  [6-38] 35 (04/28 0645) BP: (77-125)/(41-80) 101/52 (04/28 0645) SpO2:  [87 %-100 %] 92 % (04/28 0645) FiO2 (%):  [40 %] 40 % (04/28 0359) Weight:  [130.3 kg] 130.3 kg (04/28 0331) Last BM Date :  (small amount of stool in rectal tube)  Weight  change: Filed Weights   02/12/24 0449 02/13/24 0335 02/14/24 0331  Weight: 128.3 kg 131.9 kg 130.3 kg   Intake/Output:  Intake/Output Summary (Last 24 hours) at 02/14/2024 0723 Last data filed at 02/14/2024 9562 Gross per 24 hour  Intake 4363.41 ml  Output 4605 ml  Net -241.59 ml    Physical Exam   General:  intubated and sedated.  HEENT: + ETT, + Cortrak  Neck: JVD 9 cm Cor: RRR, no MRG Lungs: intubated and clear  Abdomen: soft, nontender Extremities: no cyanosis, clubbing, rash, 1-2+ b/l LE, Left upper ext edematous compared to contralateral side  Neuro: intubated and sedated  GU: +foley   Telemetry   NSR 70s. No further bradycardia, personally review   Labs    CBC Recent Labs    02/13/24 0317 02/14/24 0358  WBC 24.8* 29.5*  HGB 7.1* 7.1*  HCT 23.0* 22.7*  MCV 93.1 91.9  PLT 532* 621*   Basic Metabolic Panel Recent Labs    13/08/65 0317 02/14/24 0358  NA 149* 149*  K 4.4 3.2*  CL 117* 113*  CO2 23 24  GLUCOSE 266* 280*  BUN 80* 88*  CREATININE 1.51* 1.60*  CALCIUM  8.1* 8.1*  MG 2.6* 2.6*  PHOS 4.2 3.6   Liver Function Tests Recent Labs    02/13/24 0317 02/14/24 0358  ALBUMIN  2.1* 2.0*   D-Dimer No results for input(s): "DDIMER" in the last 72 hours.  Hemoglobin A1C No results for input(s): "HGBA1C"  in the last 72 hours.  Medications:    Scheduled Medications:  Chlorhexidine  Gluconate Cloth  6 each Topical Daily   clonazePAM   1 mg Per Tube Q8H   clopidogrel   75 mg Per Tube Daily   feeding supplement (PROSource TF20)  60 mL Per Tube TID   free water   200 mL Per Tube Q4H   insulin  aspart  0-20 Units Subcutaneous Q4H   insulin  glargine-yfgn  5 Units Subcutaneous BID   methocarbamol  (ROBAXIN ) injection  500 mg Intravenous Q8H   metoCLOPramide  (REGLAN ) injection  5 mg Intravenous Q8H   mouth rinse  15 mL Mouth Rinse Q2H   pantoprazole  (PROTONIX ) IV  40 mg Intravenous QHS   potassium chloride   20 mEq Per Tube Q4H   QUEtiapine   100 mg Per  Tube BID   rosuvastatin  40 mg Per Tube Daily   sodium chloride  flush  10-40 mL Intracatheter Q12H   Infusions:  amiodarone  30 mg/hr (02/14/24 0600)   feeding supplement (VITAL 1.5 CAL) 60 mL/hr at 02/14/24 0600   heparin  1,500 Units/hr (02/14/24 0600)   meropenem  (MERREM ) IV Stopped (02/14/24 0438)   micafungin (MYCAMINE) 100 mg in sodium chloride  0.9 % 100 mL IVPB     norepinephrine  (LEVOPHED ) Adult infusion 3 mcg/min (02/14/24 0600)   propofol  (DIPRIVAN ) infusion 20 mcg/kg/min (02/14/24 0600)   PRN Medications: acetaminophen  (TYLENOL ) oral liquid 160 mg/5 mL, albuterol , fentaNYL , ondansetron  (ZOFRAN ) IV, mouth rinse, sodium chloride  flush   Assessment/Plan   Acute type A aortic dissection  - Echo 4/15: AAA measuring 5-5.7 cm - Emergent TEE 4/15: significant for torrential aortic regurgitation and dissection flap extending from the aortic root to the proximal ascending aorta  - CT C/A/P: Showed acute type A dissection with a 5.5 cm ascending aortic aneurysm extending into arch. Dissection goes down to infrarenal aorta and extends up into innominate and left common carotid arteries  - Now s/p aortic dissection repair 02/01/24 (Bentall/AVR and reimplantation of coronaries.  2. CAD, Acute inferior STEMI - Admitted with STEMI. Post PCI found to also have aortic dissection - LHC with single vessel CAD involving the mid to distal RCA, successful PCI of the RCA with overlapping DES x 3. - No aspirin  with heparin  gtt - Continue plavix  75mg  daily - Continue atorvastatin  80mg  daily  3. Acute systolic HF ->  Cardiogenic shock - Post perfusion CGS.  - Echo 4/19: EF 25% RV moderate to severely decreased - Co-ox 72%, on NE 3  - Edematous on exam. CVP 9. Continue IV Lasix  80 mg bid + 5 mg of metolazone  x 1 - Wean NE as tolerated   4. Post-op hypoxic resp failure - extubated 4/18.  - re-intubated 4/19 - diffuse mucopurulent secretions on bronch 4/19. BAL GS: Mod WBC. Few gram variable  rods. Rare yeast - Spiked a fever on 04/23. WBC count still elevated, now up to 30K - CT C/A/P 4/27 w/ small postoperative seroma around aortic root/graft and possible PNA.  - Restart Vanc + Meropenum. Start micafungin per CCM  - continue diuresis per above - vent management per CCM. May need trach later in the week   5. PAF with post-termination pauses PVCs - Patient with AF intra-op.  - Now back in NSR.  - Continues on IV amiodarone  whlie on pressors - on IV heparin  given multiple conversions and prolonged bouts of afib   6. Hypokalemia/hypernatremia/hyperchloremia - Suspect diuretic induced (K 3.2, Na 149, Cl 113)  - supp K w/ diuresis  - continue  FW boluses   7. AKI due to ATN/shock - Baseline SCr ~1.3-1.4 - SCr peaked at 3.2, now 1.61, suspect ATN with hypotension - Monitor w/ diuresis  - continue NE to maintain MAP > 65   8. ABLA post-op - Received 6uPRBCs, 6 plt, 4 FFP and 5 cryo intra-op.   - Hgb 7.1 today, transfusion threshold per CT surgery   9. Asymmetric UE Swelling -  Left upper ext -  arrange LUE Venous doppler   CRITICAL CARE Performed by: Ruddy Corral, PA-C   Total critical care time: 20 minutes  Critical care time was exclusive of separately billable procedures and treating other patients.  Critical care was necessary to treat or prevent imminent or life-threatening deterioration.  Critical care was time spent personally by me on the following activities: development of treatment plan with patient and/or surrogate as well as nursing, discussions with consultants, evaluation of patient's response to treatment, examination of patient, obtaining history from patient or surrogate, ordering and performing treatments and interventions, ordering and review of laboratory studies, ordering and review of radiographic studies, pulse oximetry and re-evaluation of patient's condition.   Length of Stay: [address], PA-C  02/14/2024, 7:23  AM  Advanced Heart Failure Team Pager [phone] (M-F; 7a - 5p)  Please contact [facility] for night-coverage after hours (5p -7a ) and weekends on [email or web address]

## 2024-02-14 NOTE — Progress Notes (Addendum)
 Lower extremity venous duplex completed. Please see CV Procedures for preliminary results.  Initial findings reported to Cesario Collum, Charity fundraiser.  Estanislao Heimlich, RVT 02/14/24 10:26 AM

## 2024-02-14 NOTE — Plan of Care (Signed)
 Problem: Education: Goal: Knowledge of General Education information will improve Description: Including pain rating scale, medication(s)/side effects and non-pharmacologic comfort measures Outcome: Progressing   Problem: Health Behavior/Discharge Planning: Goal: Ability to manage health-related needs will improve Outcome: Progressing   Problem: Clinical Measurements: Goal: Ability to maintain clinical measurements within normal limits will improve Outcome: Progressing Goal: Will remain free from infection Outcome: Progressing Goal: Diagnostic test results will improve Outcome: Progressing Goal: Respiratory complications will improve Outcome: Progressing Goal: Cardiovascular complication will be avoided Outcome: Progressing   Problem: Activity: Goal: Risk for activity intolerance will decrease Outcome: Progressing   Problem: Nutrition: Goal: Adequate nutrition will be maintained Outcome: Progressing   Problem: Coping: Goal: Level of anxiety will decrease Outcome: Progressing   Problem: Elimination: Goal: Will not experience complications related to bowel motility Outcome: Progressing Goal: Will not experience complications related to urinary retention Outcome: Progressing   Problem: Pain Managment: Goal: General experience of comfort will improve and/or be controlled Outcome: Progressing   Problem: Safety: Goal: Ability to remain free from injury will improve Outcome: Progressing   Problem: Skin Integrity: Goal: Risk for impaired skin integrity will decrease Outcome: Progressing   Problem: Education: Goal: Understanding of cardiac disease, CV risk reduction, and recovery process will improve Outcome: Progressing Goal: Individualized Educational Video(s) Outcome: Progressing   Problem: Activity: Goal: Ability to tolerate increased activity will improve Outcome: Progressing   Problem: Cardiac: Goal: Ability to achieve and maintain adequate cardiovascular  perfusion will improve Outcome: Progressing   Problem: Health Behavior/Discharge Planning: Goal: Ability to safely manage health-related needs after discharge will improve Outcome: Progressing   Problem: Education: Goal: Understanding of CV disease, CV risk reduction, and recovery process will improve Outcome: Progressing Goal: Individualized Educational Video(s) Outcome: Progressing   Problem: Activity: Goal: Ability to return to baseline activity level will improve Outcome: Progressing   Problem: Cardiovascular: Goal: Ability to achieve and maintain adequate cardiovascular perfusion will improve Outcome: Progressing Goal: Vascular access site(s) Level 0-1 will be maintained Outcome: Progressing   Problem: Health Behavior/Discharge Planning: Goal: Ability to safely manage health-related needs after discharge will improve Outcome: Progressing   Problem: Education: Goal: Will demonstrate proper wound care and an understanding of methods to prevent future damage Outcome: Progressing Goal: Knowledge of disease or condition will improve Outcome: Progressing Goal: Knowledge of the prescribed therapeutic regimen will improve Outcome: Progressing Goal: Individualized Educational Video(s) Outcome: Progressing   Problem: Activity: Goal: Risk for activity intolerance will decrease Outcome: Progressing   Problem: Cardiac: Goal: Will achieve and/or maintain hemodynamic stability Outcome: Progressing   Problem: Clinical Measurements: Goal: Postoperative complications will be avoided or minimized Outcome: Progressing   Problem: Respiratory: Goal: Respiratory status will improve Outcome: Progressing   Problem: Skin Integrity: Goal: Wound healing without signs and symptoms of infection Outcome: Progressing Goal: Risk for impaired skin integrity will decrease Outcome: Progressing   Problem: Urinary Elimination: Goal: Ability to achieve and maintain adequate renal perfusion  and functioning will improve Outcome: Progressing   Problem: Safety: Goal: Non-violent Restraint(s) Outcome: Progressing   Problem: Education: Goal: Ability to describe self-care measures that may prevent or decrease complications (Diabetes Survival Skills Education) will improve Outcome: Progressing Goal: Individualized Educational Video(s) Outcome: Progressing   Problem: Coping: Goal: Ability to adjust to condition or change in health will improve Outcome: Progressing   Problem: Fluid Volume: Goal: Ability to maintain a balanced intake and output will improve Outcome: Progressing   Problem: Health Behavior/Discharge Planning: Goal: Ability to identify and utilize available resources and services  will improve Outcome: Progressing Goal: Ability to manage health-related needs will improve Outcome: Progressing   Problem: Metabolic: Goal: Ability to maintain appropriate glucose levels will improve Outcome: Progressing

## 2024-02-14 NOTE — Progress Notes (Signed)
 NAMEEARLY KONDOR, MRN:  [id number], DOB:  Oct 23, 1955, LOS: 13 ADMISSION DATE:  02/01/2024, CONSULTATION DATE:  02/03/24 REFERRING MD:  Sherene Dilling, CHIEF COMPLAINT:  chest pain   History of Present Illness:  68 year old man w/ hx of HTN, obesity who presented on 4/15 with sudden onset chest pain.  EKG showing inferior STEMI and went to cath lab for RCA DES x 3.  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  Taken emergently to OR for Bentall+ ascending/arch replacement.  Procedure complicated by coagulopathy related to antiplatelet therapy from recent stent, multiple liters of blood products given.  Returned to ICU 4/16 on vent and PCCM consulted to assist with vent wean on 4/17.  Currently heavily sedated; attempts at weaning resulted in agitation without ability to redirect.  Pertinent  Medical History   Past Medical History:  Diagnosis Date   Hypertension     Significant Hospital Events: Including procedures, antibiotic start and stop dates in addition to other pertinent events   4/16 RCA stent then Bentall, aortic arch replacement 4/18 extubated 4/19 reintubated in afternoon for aspiration, bronch, Delirious, low grade fevers, expressive aphasia 4/20 but mental status precludes extubation 4/21 started spiking fever again, meropenem  switched back from Zosyn , on vancomycin  as well 02/07/22 failed spontaneous breathing trial 4/24 Tachypnea, fever. Resp culture sent  4/25 underwent right heart cath showing normal filling pressure 4/26 remained in A-fib, became afebrile, Precedex  was switched to low-dose propofol   Interim History / Subjective:  No acute events overnight, baseline rate on vent has been about 35.   Objective   Blood pressure (!) 101/52, pulse 84, temperature 98.4 F (36.9 C), resp. rate (!) 35, height 6\' 1"  (1.854 m), weight 130.3 kg, SpO2 92%. CVP:  [8 mmHg-81 mmHg] 10 mmHg  Vent  Mode: PRVC FiO2 (%):  [40 %] 40 % Set Rate:  [20 bmp] 20 bmp Vt Set:  [570 mL] 570 mL PEEP:  [5 cmH20-8 cmH20] 8 cmH20 Pressure Support:  [8 cmH20-10 cmH20] 10 cmH20 Plateau Pressure:  [25 cmH20-28 cmH20] 25 cmH20   Intake/Output Summary (Last 24 hours) at 02/14/2024 0714 Last data filed at 02/14/2024 5366 Gross per 24 hour  Intake 4363.41 ml  Output 4605 ml  Net -241.59 ml   Filed Weights   02/12/24 0449 02/13/24 0335 02/14/24 0331  Weight: 128.3 kg 131.9 kg 130.3 kg    Examination: General: chronically ill appearing man lying in bed in NAD HEENT: [place]/AT, eyes anicteric, ETT in place Neuro: RASS -3 Chest: thick white sputum, faint rhales Heart: S1S2, RRR Abdomen: soft, NT Skin: warm, dry, no rashes. Sternal incision and R clavicular incision healing well. Extremities: ++edema  Na+ 149 K+ 3.2 BUN 88 Cr 1.6 WBC 29.5 H/H 7.1/22.7 Platelets 621 Coox 72% BG 280s Trach aspirate culture: mod WBC, abundant candida albicans  I/O net -240cc  in last 24 hours, with UOP 4.4L +13L for admission  Resolved Hospital Problem list   Constipation Shock combined cardiogenic/septic Hypokalemia/hypophosphatemia Shock liver, improving Perioperative coagulopathy  Assessment & Plan:  Acute inferior wall STEMI status post PCI and DES Type A aortic dissection with s/p emergent aortic reconstruction and aortic root replacement Paroxysmal A-fib with RVR; previously bradycardic on amiodarone  -post op care per TCTS -con't plavix , statin. Hold aspirin  since he is on heparin . -con't amiodarone  & heparin   Acute respiratory failure with hypoxia and hypercapnia Bilateral multifocal pneumonia -LTVV -VAP prevention protocol -  reculture sputum -PAD protocol for sedation -daily SAT & SBT as tolerated -still needs slow diuresis -broadening antimicrobials- adding micafungin due to abundant yeast in sputum, adding vanc  Acute kidney injury due to ischemic ATN from shock, improving -strict  I/O -renally dose meds, avoid nephrotoxic meds -con't diuresis  Hypernatremia  -con't FWF  Acute encephalopathy, likely septic -PAD protocol -con't antimicrobials  Perioperative acute blood loss anemia -transfuse for Hb <7 or hemodynamically significant bleeding  Obesity -long-term recommend modest weight loss  Worsening leukocytosis; most significant finding on CT scan is RLL pneumonia, less impressive dependent findings in left lung -broaden antibiotics, add antifungal coverage given abundant candida on culture and no other causative organism -reculture  Uncontrolled hyperglycemia; A1c 5.3 -increase glargine to 12 units BID -adding TF coverage aspart 2 units q4h with hold parameters -SSI PRN -goal BG <180  Hypokalemia -repleted     Best Practice (right click and "Reselect all SmartList Selections" daily)   Diet/type: Tube feeds DVT prophylaxis per TCTS Pressure ulcer(s): N/A GI prophylaxis: PPI Lines: Central line picc Foley:  Yes, and it is still needed Code Status:  full code Last date of multidisciplinary goals of care discussion: 4/25: Patient's wife was updated at bedside, stated that he is failing breathing trial, may require tracheostomy, will watch over the weekend, but high likelihood he will need tracheostomy next week  This patient is critically ill with multiple organ system failure which requires frequent high complexity decision making, assessment, support, evaluation, and titration of therapies. This was completed through the application of advanced monitoring technologies and extensive interpretation of multiple databases. During this encounter critical care time was devoted to patient care services described in this note for 36 minutes.  Joesph Mussel, DO 02/14/24 9:19 AM [facility]  For contact information, see Amion. If no response to pager, please call PCCM consult pager. After hours, 7PM- 7AM, please call Elink.

## 2024-02-15 ENCOUNTER — Encounter (HOSPITAL_COMMUNITY): Payer: Self-pay | Admitting: Surgery

## 2024-02-15 ENCOUNTER — Other Ambulatory Visit: Payer: Self-pay

## 2024-02-15 DIAGNOSIS — E876 Hypokalemia: Secondary | ICD-10-CM | POA: Diagnosis not present

## 2024-02-15 DIAGNOSIS — I82622 Acute embolism and thrombosis of deep veins of left upper extremity: Secondary | ICD-10-CM

## 2024-02-15 DIAGNOSIS — J9602 Acute respiratory failure with hypercapnia: Secondary | ICD-10-CM | POA: Diagnosis not present

## 2024-02-15 DIAGNOSIS — K5900 Constipation, unspecified: Secondary | ICD-10-CM

## 2024-02-15 DIAGNOSIS — I2111 ST elevation (STEMI) myocardial infarction involving right coronary artery: Secondary | ICD-10-CM | POA: Diagnosis not present

## 2024-02-15 DIAGNOSIS — I480 Paroxysmal atrial fibrillation: Secondary | ICD-10-CM | POA: Diagnosis not present

## 2024-02-15 DIAGNOSIS — J9601 Acute respiratory failure with hypoxia: Secondary | ICD-10-CM | POA: Diagnosis not present

## 2024-02-15 DIAGNOSIS — Z95828 Presence of other vascular implants and grafts: Secondary | ICD-10-CM

## 2024-02-15 DIAGNOSIS — I5021 Acute systolic (congestive) heart failure: Secondary | ICD-10-CM | POA: Diagnosis not present

## 2024-02-15 LAB — CBC
HCT: 23.9 % — ABNORMAL LOW (ref 39.0–52.0)
Hemoglobin: 7.4 g/dL — ABNORMAL LOW (ref 13.0–17.0)
MCH: 28.1 pg (ref 26.0–34.0)
MCHC: 31 g/dL (ref 30.0–36.0)
MCV: 90.9 fL (ref 80.0–100.0)
Platelets: 708 10*3/uL — ABNORMAL HIGH (ref 150–400)
RBC: 2.63 MIL/uL — ABNORMAL LOW (ref 4.22–5.81)
RDW: 20.6 % — ABNORMAL HIGH (ref 11.5–15.5)
WBC: 30.2 10*3/uL — ABNORMAL HIGH (ref 4.0–10.5)
nRBC: 16.2 % — ABNORMAL HIGH (ref 0.0–0.2)

## 2024-02-15 LAB — RENAL FUNCTION PANEL
Albumin: 2 g/dL — ABNORMAL LOW (ref 3.5–5.0)
Anion gap: 15 (ref 5–15)
BUN: 96 mg/dL — ABNORMAL HIGH (ref 8–23)
CO2: 25 mmol/L (ref 22–32)
Calcium: 8.3 mg/dL — ABNORMAL LOW (ref 8.9–10.3)
Chloride: 110 mmol/L (ref 98–111)
Creatinine, Ser: 1.71 mg/dL — ABNORMAL HIGH (ref 0.61–1.24)
GFR, Estimated: 43 mL/min — ABNORMAL LOW (ref >60)
Glucose, Bld: 255 mg/dL — ABNORMAL HIGH (ref 70–99)
Phosphorus: 3.8 mg/dL (ref 2.5–4.6)
Potassium: 3 mmol/L — ABNORMAL LOW (ref 3.5–5.1)
Sodium: 150 mmol/L — ABNORMAL HIGH (ref 135–145)

## 2024-02-15 LAB — GLUCOSE, CAPILLARY
Glucose-Capillary: 202 mg/dL — ABNORMAL HIGH (ref 70–99)
Glucose-Capillary: 167 mg/dL — ABNORMAL HIGH (ref 70–99)
Glucose-Capillary: 203 mg/dL — ABNORMAL HIGH (ref 70–99)
Glucose-Capillary: 176 mg/dL — ABNORMAL HIGH (ref 70–99)
Glucose-Capillary: 173 mg/dL — ABNORMAL HIGH (ref 70–99)

## 2024-02-15 LAB — COOXEMETRY PANEL
Carboxyhemoglobin: 1.4 % (ref 0.5–1.5)
Methemoglobin: 0.7 % (ref 0.0–1.5)
O2 Saturation: 74 %
Total hemoglobin: 7.2 g/dL — ABNORMAL LOW (ref 12.0–16.0)

## 2024-02-15 LAB — TRIGLYCERIDES: Triglycerides: 399 mg/dL — ABNORMAL HIGH (ref <150)

## 2024-02-15 LAB — BASIC METABOLIC PANEL WITH GFR
Anion gap: 11 (ref 5–15)
BUN: 99 mg/dL — ABNORMAL HIGH (ref 8–23)
CO2: 28 mmol/L (ref 22–32)
Calcium: 8.6 mg/dL — ABNORMAL LOW (ref 8.9–10.3)
Chloride: 115 mmol/L — ABNORMAL HIGH (ref 98–111)
Creatinine, Ser: 1.68 mg/dL — ABNORMAL HIGH (ref 0.61–1.24)
GFR, Estimated: 44 mL/min — ABNORMAL LOW (ref >60)
Glucose, Bld: 194 mg/dL — ABNORMAL HIGH (ref 70–99)
Potassium: 3.2 mmol/L — ABNORMAL LOW (ref 3.5–5.1)
Sodium: 154 mmol/L — ABNORMAL HIGH (ref 135–145)

## 2024-02-15 LAB — MAGNESIUM: Magnesium: 2.6 mg/dL — ABNORMAL HIGH (ref 1.7–2.4)

## 2024-02-15 LAB — HEPARIN LEVEL (UNFRACTIONATED): Heparin Unfractionated: 0.34 [IU]/mL (ref 0.30–0.70)

## 2024-02-15 MED ORDER — POTASSIUM CHLORIDE 20 MEQ PO PACK
40.0000 meq | PACK | ORAL | Status: AC
Start: 2024-02-15 — End: 2024-02-15
  Administered 2024-02-15 (×2): 40 meq
  Filled 2024-02-15 (×2): qty 2

## 2024-02-15 MED ORDER — FUROSEMIDE 10 MG/ML IJ SOLN
80.0000 mg | Freq: Two times a day (BID) | INTRAMUSCULAR | Status: DC
  Administered 2024-02-15: 80 mg via INTRAVENOUS
  Filled 2024-02-15: qty 8

## 2024-02-15 MED ORDER — POLYVINYL ALCOHOL 1.4 % OP SOLN
1.0000 [drp] | OPHTHALMIC | Status: DC | PRN
  Administered 2024-02-16 – 2024-02-26 (×3): 1 [drp] via OPHTHALMIC
  Filled 2024-02-15: qty 15

## 2024-02-15 MED ORDER — VANCOMYCIN HCL 1500 MG/300ML IV SOLN
1500.0000 mg | INTRAVENOUS | Status: DC
  Administered 2024-02-15 – 2024-02-16 (×2): 1500 mg via INTRAVENOUS
  Filled 2024-02-15 (×3): qty 300

## 2024-02-15 MED ORDER — METOCLOPRAMIDE HCL 5 MG/ML IJ SOLN
5.0000 mg | Freq: Two times a day (BID) | INTRAMUSCULAR | Status: DC
  Administered 2024-02-15 – 2024-02-16 (×3): 5 mg via INTRAVENOUS
  Filled 2024-02-15 (×3): qty 2

## 2024-02-15 MED ORDER — FREE WATER
200.0000 mL | Status: DC
  Administered 2024-02-15 – 2024-02-17 (×23): 200 mL

## 2024-02-15 MED ORDER — POLYETHYLENE GLYCOL 3350 17 G PO PACK
17.0000 g | PACK | Freq: Two times a day (BID) | ORAL | Status: DC
  Administered 2024-02-15 (×2): 17 g
  Filled 2024-02-15 (×2): qty 1

## 2024-02-15 MED ORDER — POTASSIUM CHLORIDE 10 MEQ/50ML IV SOLN
10.0000 meq | INTRAVENOUS | Status: AC
  Administered 2024-02-15 (×3): 10 meq via INTRAVENOUS
  Filled 2024-02-15 (×3): qty 50

## 2024-02-15 MED ORDER — INSULIN ASPART 100 UNIT/ML IJ SOLN
5.0000 [IU] | INTRAMUSCULAR | Status: DC
  Administered 2024-02-15 – 2024-02-27 (×69): 5 [IU] via SUBCUTANEOUS

## 2024-02-15 MED ORDER — FUROSEMIDE 10 MG/ML IJ SOLN: 80.0000 mg | Freq: Two times a day (BID) | INTRAMUSCULAR | Status: DC

## 2024-02-15 MED ORDER — POTASSIUM CHLORIDE 20 MEQ PO PACK
40.0000 meq | PACK | ORAL | Status: AC
  Administered 2024-02-15 (×2): 40 meq
  Filled 2024-02-15 (×2): qty 2

## 2024-02-15 MED ORDER — METOLAZONE 5 MG PO TABS
5.0000 mg | ORAL_TABLET | Freq: Once | ORAL | Status: AC
  Administered 2024-02-15: 5 mg
  Filled 2024-02-15: qty 1

## 2024-02-15 MED ORDER — OXYCODONE HCL 5 MG PO TABS
5.0000 mg | ORAL_TABLET | ORAL | Status: DC
  Administered 2024-02-15 – 2024-02-16 (×6): 5 mg
  Filled 2024-02-15 (×6): qty 1

## 2024-02-15 MED ORDER — SORBITOL 70 % SOLN
30.0000 mL | Freq: Once | Status: AC
  Administered 2024-02-15: 30 mL via ORAL
  Filled 2024-02-15: qty 30

## 2024-02-15 NOTE — Progress Notes (Signed)
 EVENING ROUNDS NOTE :     [address]       [place] [address]             [phone]                 4 Days Post-Op Procedure(s) (LRB): RIGHT HEART CATH (N/A)   Total Length of Stay:  LOS: 14 days  Events:   No events Stable day    BP (!) 108/58   Pulse 78   Temp 99 F (37.2 C)   Resp (!) 28   Ht 6\' 1"  (1.854 m)   Wt 130.3 kg   SpO2 96%   BMI 37.90 kg/m   CVP:  [9 mmHg-23 mmHg] 12 mmHg  Vent Mode: PRVC FiO2 (%):  [40 %-50 %] 45 % Set Rate:  [20 bmp] 20 bmp Vt Set:  [570 mL] 570 mL PEEP:  [8 cmH20] 8 cmH20 Pressure Support:  [10 cmH20] 10 cmH20 Plateau Pressure:  [24 cmH20-25 cmH20] 24 cmH20   amiodarone  30 mg/hr (02/15/24 1700)   feeding supplement (VITAL 1.5 CAL) 1,000 mL (02/15/24 1719)   heparin  1,600 Units/hr (02/15/24 1700)   meropenem  (MERREM ) IV Stopped (02/15/24 1200)   micafungin (MYCAMINE) 100 mg in sodium chloride  0.9 % 100 mL IVPB Stopped (02/15/24 0936)   norepinephrine  (LEVOPHED ) Adult infusion 2 mcg/min (02/15/24 1700)   propofol  (DIPRIVAN ) infusion Stopped (02/15/24 0754)   vancomycin  Stopped (02/15/24 1447)    I/O last 3 completed shifts: In: 6635 [I.V.:1814.3; NG/GT:3773.6; IV Piggyback:1047.1] Out: 8310 [Urine:8110; Emesis/NG output:200]      Latest Ref Rng & Units 02/15/2024    2:04 AM 02/14/2024    3:58 AM 02/13/2024    3:17 AM  CBC  WBC 4.0 - 10.5 K/uL 30.2  29.5  24.8   Hemoglobin 13.0 - 17.0 g/dL 7.4  7.1  7.1   Hematocrit 39.0 - 52.0 % 23.9  22.7  23.0   Platelets 150 - 400 K/uL 708  621  532        Latest Ref Rng & Units 02/15/2024    2:00 PM 02/15/2024    2:04 AM 02/14/2024    3:58 AM  BMP  Glucose 70 - 99 mg/dL 010  272  536   BUN 8 - 23 mg/dL 99  96  88   Creatinine 0.61 - 1.24 mg/dL 6.44  0.34  7.42   Sodium 135 - 145 mmol/L 154  150  149   Potassium 3.5 - 5.1 mmol/L 3.2  3.0  3.2   Chloride 98 - 111 mmol/L 115  110  113   CO2 22 - 32 mmol/L 28  25  24    Calcium  8.9 - 10.3 mg/dL 8.6  8.3  8.1      ABG    Component Value Date/Time   PHART 7.353 02/12/2024 2335   PCO2ART 41.7 02/12/2024 2335   PO2ART 70 (L) 02/12/2024 2335   HCO3 23.2 02/12/2024 2335   TCO2 24 02/12/2024 2335   ACIDBASEDEF 2.0 02/12/2024 2335   O2SAT 74 02/15/2024 0204       Starleen Eastern, MD 02/15/2024 6:06 PM

## 2024-02-15 NOTE — Progress Notes (Signed)
 4 Days Post-Op Procedure(s) (LRB): RIGHT HEART CATH (N/A) Subjective:  Remains hemodynamically stable on low dose NE to support BP. Co-ox 74%.   IV sedation stopped today. Remains on vent. CCM following.  -2300 cc yesterday.  Had two large BM's today.  Objective: Vital signs in last 24 hours: Temp:  [89.8 F (32.1 C)-99.9 F (37.7 C)] 99 F (37.2 C) (04/29 1625) Pulse Rate:  [41-134] 78 (04/29 1715) Cardiac Rhythm: Normal sinus rhythm;Sinus tachycardia;Sinus bradycardia;Atrial fibrillation;Other (Comment) (04/29 0745) Resp:  [14-54] 28 (04/29 1715) BP: (81-132)/(39-93) 108/58 (04/29 1715) SpO2:  [89 %-99 %] 96 % (04/29 1715) FiO2 (%):  [40 %-50 %] 45 % (04/29 1545)  Hemodynamic parameters for last 24 hours: CVP:  [9 mmHg-23 mmHg] 12 mmHg  Intake/Output from previous day: 04/28 0701 - 04/29 0700 In: 3422.4 [I.V.:1161.7; NG/GT:1413.6; IV Piggyback:847.1] Out: 5805 [Urine:5605; Emesis/NG output:200] Intake/Output this shift: Total I/O In: 2556.5 [I.V.:400.8; NG/GT:1578.4; IV Piggyback:577.3] Out: 2701 [Urine:2351; Stool:350]  General appearance: calm but not focused. Neurologic: moving all ext. Reportedly has followed some simple commands for nurses today. Heart: regular rate and rhythm Lungs: clear to auscultation bilaterally Abdomen: soft, non-tender; bowel sounds normal Extremities: edema moderate Wound: incisions healing well.  Lab Results: Recent Labs    02/14/24 0358 02/15/24 0204  WBC 29.5* 30.2*  HGB 7.1* 7.4*  HCT 22.7* 23.9*  PLT 621* 708*   BMET:  Recent Labs    02/15/24 0204 02/15/24 1400  NA 150* 154*  K 3.0* 3.2*  CL 110 115*  CO2 25 28  GLUCOSE 255* 194*  BUN 96* 99*  CREATININE 1.71* 1.68*  CALCIUM  8.3* 8.6*    PT/INR: No results for input(s): "LABPROT", "INR" in the last 72 hours. ABG    Component Value Date/Time   PHART 7.353 02/12/2024 2335   HCO3 23.2 02/12/2024 2335   TCO2 24 02/12/2024 2335   ACIDBASEDEF 2.0 02/12/2024  2335   O2SAT 74 02/15/2024 0204   CBG (last 3)  Recent Labs    02/15/24 0800 02/15/24 1115 02/15/24 1554  GLUCAP 167* 203* 176*    Assessment/Plan:  POD 14  Hemodynamics remain fairly stable. On and off low dose NE for BP support. RHC Friday was unremarkable with normal CI and essentially normal filling pressures.   Intermittant atrial fib on IV amio. Heparin  drip.   VDRF: will likely need trach to get off sedation and vent. Possibly Friday.   AKI: creat stable with rising BUN. Diuresed well yesterday.   Afebrile but increasing leukocytosis on MERREM  and micafungin. No clear source on CTA except lungs.  Discussed status and plans with wife at bedside. She is ok with proceeding with trach if we feel that will help him progress off vent.   LOS: 14 days    Bartley Lightning 02/15/2024

## 2024-02-15 NOTE — Progress Notes (Signed)
 PHARMACY - ANTICOAGULATION CONSULT NOTE  Pharmacy Consult for UFH IV Infusion Indication: atrial fibrillation / LLE DVT  Allergies  Allergen Reactions   Bee Venom     Patient Measurements: Height: 6\' 1"  (185.4 cm) Weight: 130.3 kg (287 lb 4.2 oz) IBW/kg (Calculated) : 79.9 HEPARIN  DW (KG): 106  Vital Signs: Temp: 98.4 F (36.9 C) (04/29 0637) BP: 113/62 (04/29 0814) Pulse Rate: 68 (04/29 0637)  Labs: Recent Labs    02/13/24 0317 02/13/24 1418 02/13/24 2204 02/14/24 0358 02/14/24 0653 02/15/24 0204  HGB 7.1*  --   --  7.1*  --  7.4*  HCT 23.0*  --   --  22.7*  --  23.9*  PLT 532*  --   --  621*  --  708*  HEPARINUNFRC  --    < > 0.18*  --  0.21* 0.34  CREATININE 1.51*  --   --  1.60*  --  1.71*   < > = values in this interval not displayed.    Estimated Creatinine Clearance: 58.5 mL/min (A) (by C-G formula based on SCr of 1.71 mg/dL (H)).   Medical History: Past Medical History:  Diagnosis Date   Hypertension     Medications:   Infusions:   amiodarone  30 mg/hr (02/15/24 0614)   feeding supplement (VITAL 1.5 CAL) 55 mL/hr at 02/15/24 0614   heparin  1,600 Units/hr (02/15/24 5784)   meropenem  (MERREM ) IV Stopped (02/15/24 0610)   micafungin (MYCAMINE) 100 mg in sodium chloride  0.9 % 100 mL IVPB 100 mg (02/15/24 0836)   norepinephrine  (LEVOPHED ) Adult infusion 1 mcg/min (02/15/24 6962)   propofol  (DIPRIVAN ) infusion 20 mcg/kg/min (02/15/24 9528)   vancomycin      PRN: acetaminophen  (TYLENOL ) oral liquid 160 mg/5 mL, albuterol , fentaNYL , ondansetron  (ZOFRAN ) IV, mouth rinse, sodium chloride  flush    Assessment: Dustin Davenport is 68 y.o. male with PMH HTN and HLD. Admitted with STEMI now with CGS. Found to have AAA requiring emergent surgical repair. Now s/p Bentall/AVR.  Pharmacy asked to start IV heparin  4/27 for afib.  Heparin  level  0.3 at goal on heparin  drip 1600 uts/hr.  No overt bleeding or complications noted. LLE DVT and superficial thrombus in LUE  found on doppler 4/28   Goal of Therapy:  Heparin  level 0.3-0.5 units/ml Monitor platelets by anticoagulation protocol: Yes   Plan:  Continue  IV Heparin   1600 units/hr Daily heparin  level and CBC. Monitor s/s bleeding    [name] Pharm.D. CPP, BCPS Clinical Pharmacist [phone] 02/15/2024 8:41 AM    [facility] pharmacy phone numbers are listed on [email or web address]

## 2024-02-15 NOTE — Progress Notes (Signed)
 Dustin Davenport, MRN:  [id number], DOB:  Dec 01, 1955, LOS: 14 ADMISSION DATE:  02/01/2024, CONSULTATION DATE:  02/03/24 REFERRING MD:  Sherene Dilling, CHIEF COMPLAINT:  chest pain   History of Present Illness:  68 year old man w/ hx of HTN, obesity who presented on 4/15 with sudden onset chest pain.  EKG showing inferior STEMI and went to cath lab for RCA DES x 3.  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  Taken emergently to OR for Bentall+ ascending/arch replacement.  Procedure complicated by coagulopathy related to antiplatelet therapy from recent stent, multiple liters of blood products given.  Returned to ICU 4/16 on vent and PCCM consulted to assist with vent wean on 4/17.  Currently heavily sedated; attempts at weaning resulted in agitation without ability to redirect.  Pertinent  Medical History   Past Medical History:  Diagnosis Date   Hypertension     Significant Hospital Events: Including procedures, antibiotic start and stop dates in addition to other pertinent events   4/16 RCA stent then Bentall, aortic arch replacement 4/18 extubated 4/19 reintubated in afternoon for aspiration, bronch, Delirious, low grade fevers, expressive aphasia 4/20 but mental status precludes extubation 4/21 started spiking fever again, meropenem  switched back from Zosyn , on vancomycin  as well 02/07/22 failed spontaneous breathing trial 4/24 Tachypnea, fever. Resp culture sent  4/25 underwent right heart cath showing normal filling pressure 4/26 remained in A-fib, became afebrile, Precedex  was switched to low-dose propofol   Interim History / Subjective:  No acute events overnight. In and out of Afib. When he transitions back into NSR his HR initially drops to 40s, in Afib up to 130s  Objective   Blood pressure (!) 101/57, pulse 68, temperature 98.4 F (36.9 C), resp. rate (!) 27, height 6\' 1"  (1.854 m), weight  130.3 kg, SpO2 95%. CVP:  [9 mmHg-23 mmHg] 21 mmHg  Vent Mode: PRVC FiO2 (%):  [40 %] 40 % Set Rate:  [20 bmp] 20 bmp Vt Set:  [570 mL] 570 mL PEEP:  [8 cmH20] 8 cmH20 Plateau Pressure:  [19 cmH20-27 cmH20] 24 cmH20   Intake/Output Summary (Last 24 hours) at 02/15/2024 0721 Last data filed at 02/15/2024 7846 Gross per 24 hour  Intake 3422.39 ml  Output 5805 ml  Net -2382.61 ml   Filed Weights   02/12/24 0449 02/13/24 0335 02/14/24 0331  Weight: 128.3 kg 131.9 kg 130.3 kg    Examination: General: critically ill appearing man lying in bed in NAD HEENT: [place]/AT, eyes anicteric, ETT in place Neuro: RASS -5, wincing to pain. Chest: less rhonchi decreased basilar breath sounds Heart: S1S2, RRR Abdomen: obese, soft, NT Skin: warm dry, no rashes  Extremities: ++ edema, no cyanosis   Na+ 150 K+ 3.0 BUN 96 Cr 1.71 WBC 30.2 H/H 7.4/23.9 Platelets 708 Coox 74% BG 200ss Trach aspirate culture: mod WBC, abundant candida albicans Trach aspirate culture: abundant PMN, rare budding yeast>   I/O net -2.4L  in last 24 hours, with UOP 5.6L +11.2L for admission  Resolved Hospital Problem list   Constipation Shock combined cardiogenic/septic Hypokalemia/hypophosphatemia Shock liver, improving Perioperative coagulopathy  Assessment & Plan:  Acute inferior wall STEMI status post PCI and DES 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 -daily SAT & SBT; d/w RN -follow repeat sputum culture -con't broad antimicrobials; so far fungal treatment  seems indicated given lack of other identified source of infection -diuresis  Acute kidney injury due to ischemic ATN from shock, stable. Azotemia sowly worsening.  -strict I/O -renally dose meds, avoid nephrotoxic meds -diuresis still needed  Hypernatremia  -con't FWF- increase to 200cc q2h  Acute encephalopathy, likely septic -PAD protocol -treat sepsis  Perioperative acute blood loss anemia -transfuse for Hb <7 or hemodynamically significant bleeding  Obesity -long-term recommend weight loss  Worsening leukocytosis; most significant finding on CT scan is RLL pneumonia, less impressive dependent findings in left lung -Con't broad antimicrobials; needs treatment of candida since no other identified source of infection -follow repeat culture  Uncontrolled hyperglycemia; A1c 5.3 -con't glargine to 12 units BID -increase TF coverage aspart to 5 units q4h with hold parameters -SSI PRN -goal BG <180  Hypokalemia -repleted give additional K+ given need for ongoing diuresis  Stage 1 sacral ulcer -turns, optimize nutrition  Possible ileus -decrease regan to q12 h dosing; monitor for TF intolerance  Constipation -sorbitol  today   Best Practice (right click and "Reselect all SmartList Selections" daily)   Diet/type: Tube feeds DVT prophylaxis heparin  gtt Pressure ulcer(s): pressure ulcer assessment deferred - stage 1 sacrum GI prophylaxis: PPI Lines: Central line picc Foley:  Yes, and it is still needed Code Status:  full code Last date of multidisciplinary goals of care discussion: 4/28: Met with patient's wife, SIL, brother to discuss trach-- they agreed for this.   This patient is critically ill with multiple organ system failure which requires frequent high complexity decision making, assessment, support, evaluation, and titration of therapies. This was completed through the application of advanced monitoring technologies and extensive interpretation of multiple  databases. During this encounter critical care time was devoted to patient care services described in this note for 38 minutes.  Joesph Mussel, DO 02/15/24 8:46 AM [facility]  For contact information, see Amion. If no response to pager, please call PCCM consult pager. After hours, 7PM- 7AM, please call Elink.

## 2024-02-15 NOTE — Progress Notes (Signed)
 Advanced Heart Failure Rounding Note  Cardiologist: None   Chief Complaint: STEMI> Cardiogenic shock> Now s/p aortic dissection repair + Bentall  Patient Profile   Dustin Davenport is a 68 y.o. male with HTN and HLD. Admitted with STEMI now with CGS. Found to have AAA and taken emergently to the OR. Now s/p Bentall/AVR and reimplantation of coronaries.  Significant events:    - Echo 4/15: AAA measuring 5-5.7 cm. Followed by emergent TEE 4/15 which was significant for torrential aortic regurgitation and dissection flap extending from the aortic root to the proximal ascending aorta  - CT C/A/P: Showed acute type A dissection with a 5.5 cm ascending aortic aneurysm extending into arch. Dissection goes down to infrarenal aorta and extends up into innominate and left common carotid artery - 4/16 Underwent aortic dissection repair (Bentall/AVR and reimplantation of coronaries. Intra-op course c/b coagulopathy due to Effient  (PCI of RCA earlier in the day)).  - Extubated 4/18 - Re-intubated 4/19. Bronch with diffuse mucopurulent secretions - 4/26 Afib with post conversion pause. Amiodarone  decreased.  - 4/28 Started back on  Vanc + Meropenum and Micafungin added 4/28 per CCM  - 4/28 LLE Venous Doppler +DVT (gastroc vein), LUE Venous Doppler + for superficial cephalic vein thrombosis   Subjective:    Intubated. RN beginning to wean down sedation.   Brief conversion to NSR this morning. Reverted back to Afib, 90s currently. Amio gtt @30  + heparin  gtt.   NE @ 1, Co-ox 74%   5.6L in UOP yesterday.  SCr 1.5>>1.6>>1.7 BUN trending up, 96 today NA 150   Continues on Vanc + mero + micafungin. WBC remain elevated but stable at 30K. Afebrile.    Objective:    Weight Range: 130.3 kg Body mass index is 37.9 kg/m.   Vital Signs:   Temp:  [98.2 F (36.8 C)-99.9 F (37.7 C)] 99 F (37.2 C) (04/29 1000) Pulse Rate:  [41-134] 98 (04/29 1000) Resp:  [14-41] 29 (04/29 1000) BP:  (94-134)/(39-93) 95/58 (04/29 1000) SpO2:  [89 %-99 %] 92 % (04/29 1000) FiO2 (%):  [40 %] 40 % (04/29 0814) Last BM Date : 02/11/24  Weight change: Filed Weights   02/12/24 0449 02/13/24 0335 02/14/24 0331  Weight: 128.3 kg 131.9 kg 130.3 kg   Intake/Output:  Intake/Output Summary (Last 24 hours) at 02/15/2024 1027 Last data filed at 02/15/2024 1000 Gross per 24 hour  Intake 3668.06 ml  Output 5071 ml  Net -1402.94 ml    Physical Exam   General:  intubated/sedated  HEENT: + ETT  Neck: Thick neck, JVD not well visualized. Cor: Irregularly irregular rhythm  Lungs: intubated and clear  Abdomen: obese/distended Extremities: 2+ b/l upper and lower ext edema +RUE PICC  Neuro: intubated/sedated   Telemetry   In and out of afib/ SR w/ frequent PACs. Now back in Afib 90s, personally reviewed   Labs    CBC Recent Labs    02/14/24 0358 02/15/24 0204  WBC 29.5* 30.2*  HGB 7.1* 7.4*  HCT 22.7* 23.9*  MCV 91.9 90.9  PLT 621* 708*   Basic Metabolic Panel Recent Labs    91/47/82 0358 02/15/24 0204  NA 149* 150*  K 3.2* 3.0*  CL 113* 110  CO2 24 25  GLUCOSE 280* 255*  BUN 88* 96*  CREATININE 1.60* 1.71*  CALCIUM  8.1* 8.3*  MG 2.6* 2.6*  PHOS 3.6 3.8   Liver Function Tests Recent Labs    02/14/24 0358 02/15/24 0204  ALBUMIN  2.0*  2.0*   D-Dimer No results for input(s): "DDIMER" in the last 72 hours.  Hemoglobin A1C No results for input(s): "HGBA1C" in the last 72 hours.  Medications:    Scheduled Medications:  Chlorhexidine  Gluconate Cloth  6 each Topical Daily   clonazePAM   1 mg Per Tube Q8H   clopidogrel   75 mg Per Tube Daily   feeding supplement (PROSource TF20)  60 mL Per Tube QID   free water   200 mL Per Tube Q2H   insulin  aspart  0-20 Units Subcutaneous Q4H   insulin  aspart  5 Units Subcutaneous Q4H   insulin  glargine-yfgn  12 Units Subcutaneous BID   metoCLOPramide  (REGLAN ) injection  5 mg Intravenous Q12H   mouth rinse  15 mL Mouth Rinse Q2H    oxyCODONE   5 mg Per Tube Q4H   pantoprazole  (PROTONIX ) IV  40 mg Intravenous QHS   polyethylene glycol  17 g Per Tube BID   potassium chloride   40 mEq Per Tube Q4H   QUEtiapine   100 mg Per Tube BID   rosuvastatin  40 mg Per Tube Daily   sodium chloride  flush  10-40 mL Intracatheter Q12H   Infusions:  amiodarone  30 mg/hr (02/15/24 0920)   feeding supplement (VITAL 1.5 CAL) 55 mL/hr at 02/15/24 0800   heparin  1,600 Units/hr (02/15/24 0800)   meropenem  (MERREM ) IV Stopped (02/15/24 0610)   micafungin (MYCAMINE) 100 mg in sodium chloride  0.9 % 100 mL IVPB 100 mg (02/15/24 0836)   norepinephrine  (LEVOPHED ) Adult infusion 1 mcg/min (02/15/24 0800)   propofol  (DIPRIVAN ) infusion Stopped (02/15/24 0754)   vancomycin      PRN Medications: acetaminophen  (TYLENOL ) oral liquid 160 mg/5 mL, albuterol , fentaNYL , ondansetron  (ZOFRAN ) IV, mouth rinse, sodium chloride  flush   Assessment/Plan   Acute type A aortic dissection  - Echo 4/15: AAA measuring 5-5.7 cm - Emergent TEE 4/15: significant for torrential aortic regurgitation and dissection flap extending from the aortic root to the proximal ascending aorta  - CT C/A/P: Showed acute type A dissection with a 5.5 cm ascending aortic aneurysm extending into arch. Dissection goes down to infrarenal aorta and extends up into innominate and left common carotid arteries  - Now s/p aortic dissection repair 02/01/24 (Bentall/AVR and reimplantation of coronaries.  2. CAD, Acute inferior STEMI - Admitted with STEMI. Post PCI found to also have aortic dissection - LHC with single vessel CAD involving the mid to distal RCA, successful PCI of the RCA with overlapping DES x 3. - No aspirin  with heparin  gtt - Continue plavix  75mg  daily - Continue atorvastatin  80mg  daily  3. Acute systolic HF ->  Cardiogenic shock - Post perfusion CGS.  - Echo 4/19: EF 25% RV moderate to severely decreased - Co-ox 74%, on NE 1  - Edematous on exam w/ 3rd spacing but given  hypernatremia (Na 150) and rising BUN (96), will hold diuretics today. D/w Dr. Bruce Caper  - Wean NE as tolerated   4. Post-op hypoxic resp failure - extubated 4/18.  - re-intubated 4/19 - diffuse mucopurulent secretions on bronch 4/19. BAL GS: Mod WBC. Few gram variable rods. Rare yeast - Spiked a fever on 04/23. WBC count still elevated, now up to 30K (stable) - CT C/A/P 4/27 w/ small postoperative seroma around aortic root/graft and possible PNA.  - Started back on  Vanc + Meropenum and Micafungin added 4/28 per CCM  - Hold diuretics today per above. Hopefully restart tomorrow to help facilitate diuresis  - vent management per CCM. May need trach later  in the week   5. PAF with post-termination pauses PVCs - Patient with AF intra-op.  - continues in and out of afib, V-rates 90s-low 100s  - Continues on IV amiodarone  whlie on pressors - continue heparin  gtt    6. Hypokalemia/hypernatremia - Suspect diuretic induced (K 3.0, Na 150) - supp K w/ KCl - hold diuretics today  - agree w/ increasing FW boluses - monitor BMP   7. AKI due to ATN/shock - Baseline SCr ~1.3-1.4 - SCr peaked at 3.2, now 1.7, suspect ATN with hypotension - continue NE to maintain MAP > 65   8. ABLA post-op - Received 6uPRBCs, 6 plt, 4 FFP and 5 cryo intra-op.   - Hgb 7.4 today - Transfuse Hgb < 7.0   9. Acute Venous Thrombosis  - +LLE DVT (gastroc vein) - + LUE superficial cephalic venin thrombosis - heparin  gtt until completion of invasive procedures (anticipate likely trach later in the week)     CRITICAL CARE Performed by: Ruddy Corral   Total critical care time: 15 minutes  Critical care time was exclusive of separately billable procedures and treating other patients.  Critical care was necessary to treat or prevent imminent or life-threatening deterioration.  Critical care was time spent personally by me on the following activities: development of treatment plan with patient and/or  surrogate as well as nursing, discussions with consultants, evaluation of patient's response to treatment, examination of patient, obtaining history from patient or surrogate, ordering and performing treatments and interventions, ordering and review of laboratory studies, ordering and review of radiographic studies, pulse oximetry and re-evaluation of patient's condition.    Length of Stay: [address], PA-C  02/15/2024, 10:27 AM  Advanced Heart Failure Team Pager [phone] (M-F; 7a - 5p)  Please contact [facility] for night-coverage after hours (5p -7a ) and weekends on [email or web address]

## 2024-02-16 DIAGNOSIS — I480 Paroxysmal atrial fibrillation: Secondary | ICD-10-CM | POA: Diagnosis not present

## 2024-02-16 DIAGNOSIS — I2111 ST elevation (STEMI) myocardial infarction involving right coronary artery: Secondary | ICD-10-CM | POA: Diagnosis not present

## 2024-02-16 DIAGNOSIS — J9601 Acute respiratory failure with hypoxia: Secondary | ICD-10-CM | POA: Diagnosis not present

## 2024-02-16 DIAGNOSIS — J9602 Acute respiratory failure with hypercapnia: Secondary | ICD-10-CM | POA: Diagnosis not present

## 2024-02-16 LAB — RENAL FUNCTION PANEL
Albumin: 1.8 g/dL — ABNORMAL LOW (ref 3.5–5.0)
Anion gap: 11 (ref 5–15)
BUN: 109 mg/dL — ABNORMAL HIGH (ref 8–23)
CO2: 28 mmol/L (ref 22–32)
Calcium: 8.5 mg/dL — ABNORMAL LOW (ref 8.9–10.3)
Chloride: 113 mmol/L — ABNORMAL HIGH (ref 98–111)
Creatinine, Ser: 1.4 mg/dL — ABNORMAL HIGH (ref 0.61–1.24)
GFR, Estimated: 55 mL/min — ABNORMAL LOW (ref >60)
Glucose, Bld: 201 mg/dL — ABNORMAL HIGH (ref 70–99)
Phosphorus: 4.8 mg/dL — ABNORMAL HIGH (ref 2.5–4.6)
Potassium: 3.6 mmol/L (ref 3.5–5.1)
Sodium: 152 mmol/L — ABNORMAL HIGH (ref 135–145)

## 2024-02-16 LAB — CULTURE, RESPIRATORY W GRAM STAIN

## 2024-02-16 LAB — CBC
HCT: 23.2 % — ABNORMAL LOW (ref 39.0–52.0)
Hemoglobin: 7 g/dL — ABNORMAL LOW (ref 13.0–17.0)
MCH: 27.6 pg (ref 26.0–34.0)
MCHC: 30.2 g/dL (ref 30.0–36.0)
MCV: 91.3 fL (ref 80.0–100.0)
Platelets: 740 K/uL — ABNORMAL HIGH (ref 150–400)
RBC: 2.54 MIL/uL — ABNORMAL LOW (ref 4.22–5.81)
RDW: 20.8 % — ABNORMAL HIGH (ref 11.5–15.5)
WBC: 23.8 K/uL — ABNORMAL HIGH (ref 4.0–10.5)
nRBC: 25.7 % — ABNORMAL HIGH (ref 0.0–0.2)

## 2024-02-16 LAB — GLUCOSE, CAPILLARY
Glucose-Capillary: 126 mg/dL — ABNORMAL HIGH (ref 70–99)
Glucose-Capillary: 132 mg/dL — ABNORMAL HIGH (ref 70–99)
Glucose-Capillary: 140 mg/dL — ABNORMAL HIGH (ref 70–99)
Glucose-Capillary: 145 mg/dL — ABNORMAL HIGH (ref 70–99)
Glucose-Capillary: 147 mg/dL — ABNORMAL HIGH (ref 70–99)
Glucose-Capillary: 155 mg/dL — ABNORMAL HIGH (ref 70–99)
Glucose-Capillary: 166 mg/dL — ABNORMAL HIGH (ref 70–99)
Glucose-Capillary: 169 mg/dL — ABNORMAL HIGH (ref 70–99)

## 2024-02-16 LAB — MAGNESIUM: Magnesium: 2.8 mg/dL — ABNORMAL HIGH (ref 1.7–2.4)

## 2024-02-16 LAB — COOXEMETRY PANEL
Carboxyhemoglobin: 1.3 % (ref 0.5–1.5)
Methemoglobin: <0.7 % (ref 0.0–1.5)
O2 Saturation: 75.2 %
Total hemoglobin: 7.2 g/dL — ABNORMAL LOW (ref 12.0–16.0)

## 2024-02-16 LAB — HEPARIN LEVEL (UNFRACTIONATED): Heparin Unfractionated: 0.33 [IU]/mL (ref 0.30–0.70)

## 2024-02-16 MED ORDER — CLONAZEPAM 0.5 MG PO TABS
0.5000 mg | ORAL_TABLET | Freq: Two times a day (BID) | ORAL | Status: DC
  Administered 2024-02-16: 0.5 mg
  Filled 2024-02-16: qty 1

## 2024-02-16 MED ORDER — QUETIAPINE FUMARATE 50 MG PO TABS
50.0000 mg | ORAL_TABLET | Freq: Two times a day (BID) | ORAL | Status: DC
  Administered 2024-02-16 (×2): 50 mg
  Filled 2024-02-16 (×2): qty 1

## 2024-02-16 MED ORDER — FUROSEMIDE 10 MG/ML IJ SOLN
80.0000 mg | Freq: Once | INTRAMUSCULAR | Status: AC
  Administered 2024-02-16: 80 mg via INTRAVENOUS
  Filled 2024-02-16: qty 8

## 2024-02-16 MED ORDER — POLYETHYLENE GLYCOL 3350 17 G PO PACK
17.0000 g | PACK | Freq: Every day | ORAL | Status: DC
  Administered 2024-02-16 – 2024-02-17 (×2): 17 g
  Filled 2024-02-16 (×2): qty 1

## 2024-02-16 MED ORDER — POTASSIUM CHLORIDE 20 MEQ PO PACK
40.0000 meq | PACK | ORAL | Status: AC
  Administered 2024-02-16 (×2): 40 meq
  Filled 2024-02-16 (×2): qty 2

## 2024-02-16 MED ORDER — OXYCODONE HCL 5 MG PO TABS
5.0000 mg | ORAL_TABLET | Freq: Four times a day (QID) | ORAL | Status: DC
  Administered 2024-02-16 – 2024-02-17 (×3): 5 mg
  Filled 2024-02-16 (×3): qty 1

## 2024-02-16 MED ORDER — METOLAZONE 5 MG PO TABS
5.0000 mg | ORAL_TABLET | Freq: Once | ORAL | Status: AC
  Administered 2024-02-16: 5 mg
  Filled 2024-02-16: qty 1

## 2024-02-16 NOTE — Consult Note (Signed)
 WOC Nurse Consult Note: Reason for Consult: Consult requested for Deep tissue pressure injury (DTPI); performed remotely after review of progress notes and photos in the EMR.  Dark red-purple DTPI to inner gluteal fold/sacrum. These are high risk to evolve into full thickness tiss ure loss within 7-10 days. Pt is critically ill with multiple systemic factors which can impair healing.  He is on a low airloss mattress to decrease pressure.  Pressure Injury POA: No Dressing procedure/placement/frequency: Topical treatment orders provided for bedside nurses to perform as follows: Apply Xeroform gauze to inner gluteal cleft Q day and cover with foam dressing.  Change foam dressing Q 3 days or PRN soiling. WOC team will assess weekly to determine if a change in the plan of care is indicated at that time.  Thank-you,  Wiliam Harder MSN, RN, CWOCN, [place], CNS [phone]

## 2024-02-16 NOTE — Progress Notes (Signed)
 [name], MRN:  [id number], DOB:  1956/05/29, LOS: 15 ADMISSION DATE:  02/01/2024, CONSULTATION DATE:  02/03/24 REFERRING MD:  Dustin Davenport, CHIEF COMPLAINT:  chest pain   History of Present Illness:  68 year old man w/ hx of HTN, obesity who presented on 4/15 with sudden onset chest pain.  EKG showing inferior STEMI and went to cath lab for RCA DES x 3.  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  Taken emergently to OR for Bentall+ ascending/arch replacement.  Procedure complicated by coagulopathy related to antiplatelet therapy from recent stent, multiple liters of blood products given.  Returned to ICU 4/16 on vent and PCCM consulted to assist with vent wean on 4/17.  Currently heavily sedated; attempts at weaning resulted in agitation without ability to redirect.  Pertinent  Medical History   Past Medical History:  Diagnosis Date   Hypertension     Significant Hospital Events: Including procedures, antibiotic start and stop dates in addition to other pertinent events   4/16 RCA stent then Bentall, aortic arch replacement 4/18 extubated 4/19 reintubated in afternoon for aspiration, bronch, Delirious, low grade fevers, expressive aphasia 4/20 but mental status precludes extubation 4/21 started spiking fever again, meropenem  switched back from Zosyn , on vancomycin  as well 02/07/22 failed spontaneous breathing trial 4/24 Tachypnea, fever. Resp culture sent  4/25 underwent right heart cath showing normal filling pressure 4/26 remained in A-fib, became afebrile, Precedex  was switched to low-dose propofol   Interim History / Subjective:  Off sedation, sleeping overnight. Had been awake during the day yesterday but still confused.  Objective   Blood pressure (!) 103/57, pulse 75, temperature 99.3 F (37.4 C), temperature source Bladder, resp. rate (!) 29, height 6\' 1"  (1.854 m), weight 128.3 kg,  SpO2 95%. CVP:  [9 mmHg-69 mmHg] 15 mmHg  Vent Mode: PRVC FiO2 (%):  [40 %-50 %] 40 % Set Rate:  [20 bmp] 20 bmp Vt Set:  [570 mL] 570 mL PEEP:  [8 cmH20] 8 cmH20 Plateau Pressure:  [19 cmH20-30 cmH20] 19 cmH20   Intake/Output Summary (Last 24 hours) at 02/16/2024 0856 Last data filed at 02/16/2024 0700 Gross per 24 hour  Intake 4179.93 ml  Output 3676 ml  Net 503.93 ml   Filed Weights   02/13/24 0335 02/14/24 0331 02/16/24 0500  Weight: 131.9 kg 130.3 kg 128.3 kg    Examination: General: critically ill appearing man lying in bed in NAD HEENT: /AT, eyes anicteric Neuro: RASS -4, moving R arm move when his wife is talking. Wincing with noxious stimulation.  Chest: reduced basilar breath sounds Heart: S1S2, RRR Abdomen: obese, soft, NT Skin: warm, dry, no rashes  Extremities: ++ edema, no cyanosis  Na+ 152 K+ 3.6 BUN 109 Cr 1.4 Phos 4.8 Coox 75% BG 170s Trach aspirate culture: mod WBC, abundant candida albicans Trach aspirate culture: abundant PMN, rare budding yeast> pending  I/O net -2.4L  in last 24 hours, with UOP 5.6L +11.2L for admission  Resolved Hospital Problem list   Constipation Shock combined cardiogenic/septic Hypokalemia/hypophosphatemia Shock liver, improving Perioperative coagulopathy  Assessment & Plan:  Acute inferior wall STEMI status post PCI and DES 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 Azotemia sowly worsening, but Cr improving -strict I/O -renally dose meds, avoid nephrotoxic med -needs diuresis & FWF  Hypernatremia, slowly improving -con't high dose FWF 200cc q2h   Acute encephalopathy, likely septic -PAD protocol; goal RASS 0 to -1. Holding IV sedation. Reducing clonazepam , seroquel , oxycodone  enteral doses  Perioperative acute blood loss anemia -transfuse for Hb <7 or hemodynamically significant bleeding  Obesity -long term recommend weight loss; con't TF  Worsening leukocytosis; most significant finding on CT scan is RLL pneumonia, less impressive dependent findings in left lung -con't broad antimicrobials -follow repeat cultures -repeat CBC now -need to pull lines as able  Uncontrolled hyperglycemia; A1c 5.3 -con't glargine 12 units BID -increase TF coverage aspart to 5 units q4h with hold parameters -SSI PRN -goal BG <180  Hypokalemia -prophylactic repletion, monitor  Stage 1 sacral ulcer -optimize nutrition, turns   Ileus, resolved -con't decrease reglan , keep weaning off over the next few days  Constipation, resolved -resume daily bowel regimen   Wife updated at bedside.   Best Practice (right click and "Reselect all SmartList Selections" daily)   Diet/type: Tube feeds DVT prophylaxis heparin  gtt Pressure ulcer(s): pressure ulcer assessment deferred - stage 1 sacrum GI prophylaxis: PPI Lines: Central line picc Foley:  Yes, and it is still needed Code Status:  full code Last date of multidisciplinary goals of care discussion: 4/28: Met with patient's wife, SIL, brother to discuss trach-- they agreed for this.   This patient is critically ill with multiple organ system failure which requires frequent high complexity decision making, assessment, support, evaluation, and titration of therapies. This was completed through the application of advanced monitoring  technologies and extensive interpretation of multiple databases. During this encounter critical care time was devoted to patient care services described in this note for 35 minutes.  Dustin Mussel, DO 02/16/24 9:40 AM [facility]  For contact information, see Amion. If no response to pager, please call PCCM consult pager. After hours, 7PM- 7AM, please call Elink.

## 2024-02-16 NOTE — Progress Notes (Signed)
 PHARMACY - ANTICOAGULATION CONSULT NOTE  Pharmacy Consult for UFH IV Infusion Indication: atrial fibrillation / LLE DVT  Allergies  Allergen Reactions   Bee Venom     Patient Measurements: Height: 6\' 1"  (185.4 cm) Weight: 128.3 kg (282 lb 13.6 oz) IBW/kg (Calculated) : 79.9 HEPARIN  DW (KG): 106  Vital Signs: Temp: 98.8 F (37.1 C) (04/30 1130) Temp Source: Axillary (04/30 1130) BP: 87/55 (04/30 1315) Pulse Rate: 124 (04/30 1325)  Labs: Recent Labs    02/14/24 0358 02/14/24 0653 02/15/24 0204 02/15/24 1400 02/16/24 0505 02/16/24 1052  HGB 7.1*  --  7.4*  --   --  7.0*  HCT 22.7*  --  23.9*  --   --  23.2*  PLT 621*  --  708*  --   --  740*  HEPARINUNFRC  --  0.21* 0.34  --  0.33  --   CREATININE 1.60*  --  1.71* 1.68* 1.40*  --     Estimated Creatinine Clearance: 70.9 mL/min (A) (by C-G formula based on SCr of 1.4 mg/dL (H)).   Medical History: Past Medical History:  Diagnosis Date   Hypertension     Medications:   Infusions:   amiodarone  30 mg/hr (02/16/24 1300)   feeding supplement (VITAL 1.5 CAL) 55 mL/hr at 02/16/24 1300   heparin  1,600 Units/hr (02/16/24 1300)   meropenem  (MERREM ) IV 200 mL/hr at 02/16/24 1300   micafungin (MYCAMINE) 100 mg in sodium chloride  0.9 % 100 mL IVPB Stopped (02/16/24 1207)   norepinephrine  (LEVOPHED ) Adult infusion 2 mcg/min (02/16/24 1300)   propofol  (DIPRIVAN ) infusion Stopped (02/15/24 0754)   vancomycin  1,500 mg (02/16/24 1308)   PRN: acetaminophen  (TYLENOL ) oral liquid 160 mg/5 mL, albuterol , fentaNYL , ondansetron  (ZOFRAN ) IV, mouth rinse, polyvinyl alcohol, sodium chloride  flush    Assessment: Dustin Davenport is 68 y.o. male with PMH HTN and HLD. Admitted with STEMI now with CGS. Found to have AAA requiring emergent surgical repair. Now s/p Bentall/AVR.  Pharmacy asked to start IV heparin  4/27 for afib.  Heparin  level 0.33 at goal on heparin  drip 1600 uts/hr.  No overt bleeding or complications noted. LLE DVT and  superficial thrombus in LUE found on doppler 4/28   Goal of Therapy:  Heparin  level 0.3-0.5 units/ml Monitor platelets by anticoagulation protocol: Yes   Plan:  Continue  IV Heparin   1600 units/hr Daily heparin  level and CBC. Monitor s/s bleeding  Awaiting decision on CABG.  Joanell Mowers, Davey Erp, BCCP Clinical Pharmacist  02/16/2024 2:50 PM   [facility] pharmacy phone numbers are listed on [email or web address]

## 2024-02-16 NOTE — Progress Notes (Addendum)
 Advanced Heart Failure Rounding Note  Cardiologist: None   Chief Complaint: STEMI> Cardiogenic shock> Now s/p aortic dissection repair + Bentall  Patient Profile   Dustin Davenport is a 68 y.o. male with HTN and HLD. Admitted with STEMI now with CGS. Found to have AAA and taken emergently to the OR. Now s/p Bentall/AVR and reimplantation of coronaries.  Significant events:    - Echo 4/15: AAA measuring 5-5.7 cm. Followed by emergent TEE 4/15 which was significant for torrential aortic regurgitation and dissection flap extending from the aortic root to the proximal ascending aorta  - CT C/A/P: Showed acute type A dissection with a 5.5 cm ascending aortic aneurysm extending into arch. Dissection goes down to infrarenal aorta and extends up into innominate and left common carotid artery - 4/16 Underwent aortic dissection repair (Bentall/AVR and reimplantation of coronaries. Intra-op course c/b coagulopathy due to Effient  (PCI of RCA earlier in the day)).  - Extubated 4/18 - Re-intubated 4/19. Bronch with diffuse mucopurulent secretions - 4/26 Afib with post conversion pause. Amiodarone  decreased.  - 4/28 Started back on  Vanc + Meropenum and Micafungin added 4/28 per CCM  - 4/28 LLE Venous Doppler +DVT (gastroc vein), LUE Venous Doppler + for superficial cephalic vein thrombosis   Subjective:    Remains intubated.   Continues in Afib, 90s-low 100s.   Off pressor support w/ stable BP. Co-ox 75%. CVP 7 but c/w 3rd spacing. Up ~20 lb from admit wt. Albumin  1.8   3.6L in UOP yesterday.  SCr 1.5>>1.6>>1.7>>1.40  BUN trending up, 96>>109 today NA 152   Continues on Vanc + mero + micafungin. Low grade fevers overnight. F/u CBC pending. RespCx pending    Objective:    Weight Range: 128.3 kg Body mass index is 37.32 kg/m.   Vital Signs:   Temp:  [89.8 F (32.1 C)-100.1 F (37.8 C)] 99.1 F (37.3 C) (04/30 0800) Pulse Rate:  [53-133] 75 (04/30 0700) Resp:  [14-54] 29 (04/30  0700) BP: (78-130)/(32-76) 103/57 (04/30 0700) SpO2:  [90 %-99 %] 96 % (04/30 0842) FiO2 (%):  [40 %-50 %] 40 % (04/30 0842) Weight:  [128.3 kg] 128.3 kg (04/30 0500) Last BM Date : 02/15/24  Weight change: Filed Weights   02/13/24 0335 02/14/24 0331 02/16/24 0500  Weight: 131.9 kg 130.3 kg 128.3 kg   Intake/Output:  Intake/Output Summary (Last 24 hours) at 02/16/2024 1048 Last data filed at 02/16/2024 0700 Gross per 24 hour  Intake 3979.93 ml  Output 2866 ml  Net 1113.93 ml    Physical Exam   General:  intubated, grimaces to painful stimuli   HEENT: + ETT  Neck: JVD not elevated  Cor: Irregularly irregular rhythm   Lungs: intubated and clear  Abdomen: obese, soft, NT Extremities: 2+ b/ LEE up to thighs  Neuro: intubated, grimaces to painful stimuli   Telemetry   Afib 90s-low 100s, personally reviewed   Labs    CBC Recent Labs    02/14/24 0358 02/15/24 0204  WBC 29.5* 30.2*  HGB 7.1* 7.4*  HCT 22.7* 23.9*  MCV 91.9 90.9  PLT 621* 708*   Basic Metabolic Panel Recent Labs    40/98/11 0204 02/15/24 1400 02/16/24 0505  NA 150* 154* 152*  K 3.0* 3.2* 3.6  CL 110 115* 113*  CO2 25 28 28   GLUCOSE 255* 194* 201*  BUN 96* 99* 109*  CREATININE 1.71* 1.68* 1.40*  CALCIUM  8.3* 8.6* 8.5*  MG 2.6*  --  2.8*  PHOS  3.8  --  4.8*   Liver Function Tests Recent Labs    02/15/24 0204 02/16/24 0505  ALBUMIN  2.0* 1.8*   D-Dimer No results for input(s): "DDIMER" in the last 72 hours.  Hemoglobin A1C No results for input(s): "HGBA1C" in the last 72 hours.  Medications:    Scheduled Medications:  Chlorhexidine  Gluconate Cloth  6 each Topical Daily   clonazePAM   0.5 mg Per Tube BID   clopidogrel   75 mg Per Tube Daily   feeding supplement (PROSource TF20)  60 mL Per Tube QID   free water   200 mL Per Tube Q2H   furosemide   80 mg Intravenous Once   insulin  aspart  0-20 Units Subcutaneous Q4H   insulin  aspart  5 Units Subcutaneous Q4H   insulin  glargine-yfgn   12 Units Subcutaneous BID   metoCLOPramide  (REGLAN ) injection  5 mg Intravenous Q12H   metolazone   5 mg Per Tube Once   mouth rinse  15 mL Mouth Rinse Q2H   oxyCODONE   5 mg Per Tube Q6H   pantoprazole  (PROTONIX ) IV  40 mg Intravenous QHS   polyethylene glycol  17 g Per Tube Daily   potassium chloride   40 mEq Per Tube Q4H   QUEtiapine   50 mg Per Tube BID   rosuvastatin  40 mg Per Tube Daily   sodium chloride  flush  10-40 mL Intracatheter Q12H   Infusions:  amiodarone  30 mg/hr (02/16/24 0700)   feeding supplement (VITAL 1.5 CAL) 55 mL/hr at 02/16/24 0700   heparin  1,600 Units/hr (02/16/24 0700)   meropenem  (MERREM ) IV Stopped (02/16/24 0445)   micafungin (MYCAMINE) 100 mg in sodium chloride  0.9 % 100 mL IVPB Stopped (02/15/24 0936)   norepinephrine  (LEVOPHED ) Adult infusion Stopped (02/16/24 0415)   propofol  (DIPRIVAN ) infusion Stopped (02/15/24 0754)   vancomycin  Stopped (02/15/24 1447)   PRN Medications: acetaminophen  (TYLENOL ) oral liquid 160 mg/5 mL, albuterol , fentaNYL , ondansetron  (ZOFRAN ) IV, mouth rinse, polyvinyl alcohol, sodium chloride  flush   Assessment/Plan   Acute type A aortic dissection  - Echo 4/15: AAA measuring 5-5.7 cm - Emergent TEE 4/15: significant for torrential aortic regurgitation and dissection flap extending from the aortic root to the proximal ascending aorta  - CT C/A/P: Showed acute type A dissection with a 5.5 cm ascending aortic aneurysm extending into arch. Dissection goes down to infrarenal aorta and extends up into innominate and left common carotid arteries  - Now s/p aortic dissection repair 02/01/24 (Bentall/AVR and reimplantation of coronaries.  2. CAD, Acute inferior STEMI - Admitted with STEMI. Post PCI found to also have aortic dissection - LHC with single vessel CAD involving the mid to distal RCA, successful PCI of the RCA with overlapping DES x 3. - No aspirin  with heparin  gtt - Continue plavix  75mg  daily - Continue Crestor 40 mg  daily  3. Acute systolic HF ->  Cardiogenic shock - Post perfusion CGS.  - Echo 4/19: EF 25% RV moderate to severely decreased - now off NE w/ stable BP - Co-ox 75%  - CVP only 7 but remains edematous on exam w/ 3rd spacing, in setting of hypoalbuminemia (1.8). Up close to 20 lb above admit wt - 80 mg IV Lasix  + 5 of metolazone  ordered. ? Addition of albumin  to help w/ 3rd spacing. Will d/w CCM  - continue TEDs    4. Post-op hypoxic resp failure - extubated 4/18.  - re-intubated 4/19 - diffuse mucopurulent secretions on bronch 4/19. BAL GS: Mod WBC. Few gram variable rods. Rare yeast -  Spiked a fever on 04/23. WBC count still elevated, now up to 30K (stable) - CT C/A/P 4/27 w/ small postoperative seroma around aortic root/graft and possible PNA.  - Started back on  Vanc + Meropenum and Micafungin added 4/28 per CCM  - continue diuresis per above  - vent management per CCM. Will likely need trach later in the week (Friday)   5. PAF with post-termination pauses PVCs - Patient with AF intra-op.  - continues in and out of afib, V-rates 90s-low 100s  - on IV amiodarone , ? Transition back to PO now off pressors to reduce volume load  - continue heparin  gtt    6. Hypokalemia/hypernatremia - Suspect diuretic induced (K 3.6, Na 152) - give KCl w/ diuretics today  - continue FW boluses - monitor BMP   7. AKI due to ATN/shock - resolved  - Baseline SCr ~1.3-1.4 - SCr peaked at 3.2, now 1.4. UOP Good  - BUN 109, GFR 55. Elevated BUN may be 2/2 TFs   8. ABLA post-op - Received 6uPRBCs, 6 plt, 4 FFP and 5 cryo intra-op.   - Transfuse Hgb < 7.0  - CBC pending   9. Acute Venous Thrombosis  - +LLE DVT (gastroc vein) - + LUE superficial cephalic venin thrombosis - heparin  gtt until completion of invasive procedures (anticipate likely trach later in the week)    CRITICAL CARE Performed by: Ruddy Corral   Total critical care time: 15 minutes  Critical care time was  exclusive of separately billable procedures and treating other patients.  Critical care was necessary to treat or prevent imminent or life-threatening deterioration.  Critical care was time spent personally by me on the following activities: development of treatment plan with patient and/or surrogate as well as nursing, discussions with consultants, evaluation of patient's response to treatment, examination of patient, obtaining history from patient or surrogate, ordering and performing treatments and interventions, ordering and review of laboratory studies, ordering and review of radiographic studies, pulse oximetry and re-evaluation of patient's condition.      Length of Stay: [address], PA-C  02/16/2024, 10:48 AM  Advanced Heart Failure Team Pager [phone] (M-F; 7a - 5p)  Please contact [facility] for night-coverage after hours (5p -7a ) and weekends on [email or web address]

## 2024-02-16 NOTE — Plan of Care (Signed)
  Problem: Safety: Goal: Ability to remain free from injury will improve Outcome: Progressing   Problem: Skin Integrity: Goal: Risk for impaired skin integrity will decrease Outcome: Progressing   Problem: Education: Goal: Understanding of cardiac disease, CV risk reduction, and recovery process will improve Outcome: Progressing Goal: Individualized Educational Video(s) Outcome: Progressing   Problem: Activity: Goal: Ability to tolerate increased activity will improve Outcome: Progress

## 2024-02-17 ENCOUNTER — Inpatient Hospital Stay (HOSPITAL_COMMUNITY)

## 2024-02-17 DIAGNOSIS — J9601 Acute respiratory failure with hypoxia: Secondary | ICD-10-CM | POA: Diagnosis not present

## 2024-02-17 DIAGNOSIS — E876 Hypokalemia: Secondary | ICD-10-CM | POA: Diagnosis not present

## 2024-02-17 DIAGNOSIS — G9340 Encephalopathy, unspecified: Secondary | ICD-10-CM | POA: Diagnosis not present

## 2024-02-17 DIAGNOSIS — I480 Paroxysmal atrial fibrillation: Secondary | ICD-10-CM | POA: Diagnosis not present

## 2024-02-17 DIAGNOSIS — J9602 Acute respiratory failure with hypercapnia: Secondary | ICD-10-CM | POA: Diagnosis not present

## 2024-02-17 DIAGNOSIS — I2111 ST elevation (STEMI) myocardial infarction involving right coronary artery: Secondary | ICD-10-CM | POA: Diagnosis not present

## 2024-02-17 DIAGNOSIS — R739 Hyperglycemia, unspecified: Secondary | ICD-10-CM | POA: Diagnosis not present

## 2024-02-17 DIAGNOSIS — I5021 Acute systolic (congestive) heart failure: Secondary | ICD-10-CM | POA: Diagnosis not present

## 2024-02-17 LAB — RENAL FUNCTION PANEL
Albumin: 1.8 g/dL — ABNORMAL LOW (ref 3.5–5.0)
Albumin: 1.9 g/dL — ABNORMAL LOW (ref 3.5–5.0)
Anion gap: 10 (ref 5–15)
Anion gap: 5 (ref 5–15)
BUN: 114 mg/dL — ABNORMAL HIGH (ref 8–23)
BUN: 95 mg/dL — ABNORMAL HIGH (ref 8–23)
CO2: 29 mmol/L (ref 22–32)
CO2: 30 mmol/L (ref 22–32)
Calcium: 8.5 mg/dL — ABNORMAL LOW (ref 8.9–10.3)
Calcium: 8.4 mg/dL — ABNORMAL LOW (ref 8.9–10.3)
Chloride: 113 mmol/L — ABNORMAL HIGH (ref 98–111)
Chloride: 116 mmol/L — ABNORMAL HIGH (ref 98–111)
Creatinine, Ser: 1.82 mg/dL — ABNORMAL HIGH (ref 0.61–1.24)
Creatinine, Ser: 1.53 mg/dL — ABNORMAL HIGH (ref 0.61–1.24)
GFR, Estimated: 40 mL/min — ABNORMAL LOW (ref >60)
GFR, Estimated: 49 mL/min — ABNORMAL LOW (ref >60)
Glucose, Bld: 200 mg/dL — ABNORMAL HIGH (ref 70–99)
Glucose, Bld: 202 mg/dL — ABNORMAL HIGH (ref 70–99)
Phosphorus: 5.1 mg/dL — ABNORMAL HIGH (ref 2.5–4.6)
Phosphorus: 4.1 mg/dL (ref 2.5–4.6)
Potassium: 3.3 mmol/L — ABNORMAL LOW (ref 3.5–5.1)
Potassium: 3.5 mmol/L (ref 3.5–5.1)
Sodium: 152 mmol/L — ABNORMAL HIGH (ref 135–145)
Sodium: 151 mmol/L — ABNORMAL HIGH (ref 135–145)

## 2024-02-17 LAB — CBC
HCT: 23.3 % — ABNORMAL LOW (ref 39.0–52.0)
Hemoglobin: 7.1 g/dL — ABNORMAL LOW (ref 13.0–17.0)
MCH: 27.5 pg (ref 26.0–34.0)
MCHC: 30.5 g/dL (ref 30.0–36.0)
MCV: 90.3 fL (ref 80.0–100.0)
Platelets: 783 10*3/uL — ABNORMAL HIGH (ref 150–400)
RBC: 2.58 MIL/uL — ABNORMAL LOW (ref 4.22–5.81)
RDW: 20.7 % — ABNORMAL HIGH (ref 11.5–15.5)
WBC: 20.4 10*3/uL — ABNORMAL HIGH (ref 4.0–10.5)
nRBC: 33.8 % — ABNORMAL HIGH (ref 0.0–0.2)

## 2024-02-17 LAB — GLUCOSE, CAPILLARY
Glucose-Capillary: 126 mg/dL — ABNORMAL HIGH (ref 70–99)
Glucose-Capillary: 128 mg/dL — ABNORMAL HIGH (ref 70–99)
Glucose-Capillary: 137 mg/dL — ABNORMAL HIGH (ref 70–99)
Glucose-Capillary: 138 mg/dL — ABNORMAL HIGH (ref 70–99)
Glucose-Capillary: 140 mg/dL — ABNORMAL HIGH (ref 70–99)
Glucose-Capillary: 164 mg/dL — ABNORMAL HIGH (ref 70–99)

## 2024-02-17 LAB — COOXEMETRY PANEL
Carboxyhemoglobin: 1.5 % (ref 0.5–1.5)
Methemoglobin: <0.7 % (ref 0.0–1.5)
O2 Saturation: 71 %
Total hemoglobin: 7.4 g/dL — ABNORMAL LOW (ref 12.0–16.0)

## 2024-02-17 LAB — HEPARIN LEVEL (UNFRACTIONATED): Heparin Unfractionated: 0.38 [IU]/mL (ref 0.30–0.70)

## 2024-02-17 MED ORDER — POTASSIUM CHLORIDE 20 MEQ PO PACK
40.0000 meq | PACK | ORAL | Status: AC
  Administered 2024-02-17 (×2): 40 meq
  Filled 2024-02-17 (×2): qty 2

## 2024-02-17 MED ORDER — FREE WATER
200.0000 mL | Status: DC
  Administered 2024-02-18 (×2): 200 mL

## 2024-02-17 MED ORDER — PRISMASOL BGK 4/2.5 32-4-2.5 MEQ/L EC SOLN: Status: DC

## 2024-02-17 MED ORDER — GERHARDT'S BUTT CREAM
TOPICAL_CREAM | Freq: Every day | CUTANEOUS | Status: DC
Start: 2024-02-18 — End: 2024-02-28
  Filled 2024-02-17: qty 60

## 2024-02-17 MED ORDER — METOCLOPRAMIDE HCL 5 MG/ML IJ SOLN
5.0000 mg | Freq: Every day | INTRAMUSCULAR | Status: DC
  Administered 2024-02-17: 5 mg via INTRAVENOUS
  Filled 2024-02-17: qty 2

## 2024-02-17 MED ORDER — POLYETHYLENE GLYCOL 3350 17 G PO PACK
17.0000 g | PACK | Freq: Every day | ORAL | Status: DC
  Administered 2024-02-19 – 2024-02-20 (×2): 17 g via ORAL
  Filled 2024-02-17 (×2): qty 1

## 2024-02-17 MED ORDER — MIDAZOLAM HCL 2 MG/2ML IJ SOLN
2.0000 mg | INTRAMUSCULAR | Status: AC | PRN
  Administered 2024-02-17: 1 mg via INTRAVENOUS
  Administered 2024-02-18: 2 mg via INTRAVENOUS
  Filled 2024-02-17 (×3): qty 2

## 2024-02-17 MED ORDER — POTASSIUM CHLORIDE 10 MEQ/50ML IV SOLN
10.0000 meq | INTRAVENOUS | Status: AC
  Administered 2024-02-17 (×2): 10 meq via INTRAVENOUS
  Filled 2024-02-17 (×3): qty 50

## 2024-02-17 MED ORDER — SODIUM CHLORIDE 0.9 % IV SOLN
20.0000 ug | Freq: Once | INTRAVENOUS | Status: AC
  Administered 2024-02-18: 20 ug via INTRAVENOUS
  Filled 2024-02-17: qty 5

## 2024-02-17 MED ORDER — SODIUM CHLORIDE 0.9 % IV SOLN
150.0000 mg | INTRAVENOUS | Status: AC
  Administered 2024-02-17 – 2024-02-20 (×4): 150 mg via INTRAVENOUS
  Filled 2024-02-17 (×4): qty 7.5

## 2024-02-17 MED ORDER — SODIUM CHLORIDE 0.9 % IV SOLN: 1.0000 g | Freq: Three times a day (TID) | INTRAVENOUS | Status: DC

## 2024-02-17 MED ORDER — GERHARDT'S BUTT CREAM: TOPICAL_CREAM | Freq: Every day | CUTANEOUS | Status: DC

## 2024-02-17 MED ORDER — CLOPIDOGREL BISULFATE 75 MG PO TABS
75.0000 mg | ORAL_TABLET | Freq: Every day | ORAL | Status: DC
  Administered 2024-02-18 – 2024-02-25 (×8): 75 mg via ORAL
  Filled 2024-02-17 (×10): qty 1

## 2024-02-17 MED ORDER — FREE WATER
300.0000 mL | Status: DC
  Administered 2024-02-17 (×5): 300 mL
  Administered 2024-02-17: 30 mL

## 2024-02-17 MED ORDER — SODIUM CHLORIDE 0.9 % IV SOLN: 15.0000 g | Freq: Three times a day (TID) | INTRAVENOUS | Status: DC

## 2024-02-17 MED ORDER — ROSUVASTATIN CALCIUM 20 MG PO TABS
40.0000 mg | ORAL_TABLET | Freq: Every day | ORAL | Status: DC
  Administered 2024-02-19 – 2024-02-25 (×7): 40 mg via ORAL
  Filled 2024-02-17 (×8): qty 2

## 2024-02-17 MED ORDER — SODIUM CHLORIDE 0.9 % FOR CRRT: INTRAVENOUS_CENTRAL | Status: DC | PRN

## 2024-02-17 MED ORDER — HEPARIN SODIUM (PORCINE) 1000 UNIT/ML DIALYSIS
1000.0000 [IU] | INTRAMUSCULAR | Status: DC | PRN
  Administered 2024-02-18: 2000 [IU] via INTRAVENOUS_CENTRAL
  Filled 2024-02-17 (×3): qty 6

## 2024-02-17 MED ORDER — FENTANYL CITRATE PF 50 MCG/ML IJ SOSY
25.0000 ug | PREFILLED_SYRINGE | INTRAMUSCULAR | Status: DC | PRN
  Administered 2024-02-17 – 2024-02-18 (×6): 50 ug via INTRAVENOUS
  Administered 2024-02-18 (×2): 100 ug via INTRAVENOUS
  Filled 2024-02-17 (×2): qty 2
  Filled 2024-02-17: qty 1
  Filled 2024-02-17: qty 2
  Filled 2024-02-17 (×2): qty 1
  Filled 2024-02-17: qty 2

## 2024-02-17 NOTE — Progress Notes (Signed)
      [address]       [place] [address]             [phone]      POD # 16  Intubated, sedated BP 109/76   Pulse 80   Temp 99.3 F (37.4 C) (Axillary)   Resp (!) 26   Ht 6\' 1"  (1.854 m)   Wt 128 kg   SpO2 98%   BMI 37.23 kg/m  CVP 13  Intake/Output Summary (Last 24 hours) at 02/17/2024 1754 Last data filed at 02/17/2024 1700 Gross per 24 hour  Intake 4505.65 ml  Output 3889 ml  Net 616.65 ml   CRRT started about 4 PM CT- normal For trach tomorrow  Landon Pinion C. Luna Salinas, MD [facility] [phone]

## 2024-02-17 NOTE — Progress Notes (Signed)
 PHARMACY - ANTICOAGULATION CONSULT NOTE  Pharmacy Consult for UFH IV Infusion Indication: atrial fibrillation / LLE DVT  Allergies  Allergen Reactions   Bee Venom     Patient Measurements: Height: 6\' 1"  (185.4 cm) Weight: 128 kg (282 lb 3 oz) IBW/kg (Calculated) : 79.9 HEPARIN  DW (KG): 106  Vital Signs: Temp: 98.8 F (37.1 C) (05/01 0700) Temp Source: Axillary (05/01 0700) BP: 105/48 (05/01 0920) Pulse Rate: 82 (05/01 0920)  Labs: Recent Labs    02/15/24 0204 02/15/24 1400 02/16/24 0505 02/16/24 1052 02/17/24 0503  HGB 7.4*  --   --  7.0* 7.1*  HCT 23.9*  --   --  23.2* 23.3*  PLT 708*  --   --  740* 783*  HEPARINUNFRC 0.34  --  0.33  --  0.38  CREATININE 1.71* 1.68* 1.40*  --  1.82*    Estimated Creatinine Clearance: 54.5 mL/min (A) (by C-G formula based on SCr of 1.82 mg/dL (H)).   Medical History: Past Medical History:  Diagnosis Date   Hypertension     Medications:   Infusions:   amiodarone  30 mg/hr (02/17/24 0900)   [START ON 02/18/2024] desmopressin  (DDAVP ) 20 mcg in sodium chloride  0.9 % 50 mL IVPB     feeding supplement (VITAL 1.5 CAL) 55 mL/hr at 02/17/24 0900   heparin  1,600 Units/hr (02/17/24 0900)   micafungin  (MYCAMINE ) 150 mg in sodium chloride  0.9 % 100 mL IVPB     norepinephrine  (LEVOPHED ) Adult infusion 6 mcg/min (02/17/24 0900)   potassium chloride  10 mEq (02/17/24 0916)   propofol  (DIPRIVAN ) infusion Stopped (02/15/24 0754)   PRN: acetaminophen  (TYLENOL ) oral liquid 160 mg/5 mL, albuterol , fentaNYL  (SUBLIMAZE ) injection, midazolam , ondansetron  (ZOFRAN ) IV, mouth rinse, polyvinyl alcohol , sodium chloride  flush    Assessment: Tionne Podolsky is 68 y.o. male with PMH HTN and HLD. Admitted with STEMI now with CGS. Found to have AAA requiring emergent surgical repair. Now s/p Bentall/AVR.  Pharmacy asked to start IV heparin  4/27 for afib.  Heparin  level 0.38 at goal on heparin  drip 1600 uts/hr.  No overt bleeding or complications noted. LLE  DVT and superficial thrombus in LUE found on doppler 4/28   Goal of Therapy:  Heparin  level 0.3-0.5 units/ml Monitor platelets by anticoagulation protocol: Yes   Plan:  Continue IV Heparin  1600 units/hr Daily heparin  level and CBC. Monitor s/s bleeding.  Joanell Mowers, Davey Erp, BCCP Clinical Pharmacist  02/17/2024 9:27 AM   [facility] pharmacy phone numbers are listed on [email or web address]

## 2024-02-17 NOTE — Progress Notes (Signed)
 Dustin Davenport, MRN:  [id number], DOB:  05/16/1956, LOS: 16 ADMISSION DATE:  02/01/2024, CONSULTATION DATE:  02/03/24 REFERRING MD:  Sherene Dilling, CHIEF COMPLAINT:  chest pain   History of Present Illness:  68 year old man w/ hx of HTN, obesity who presented on 4/15 with sudden onset chest pain.  EKG showing inferior STEMI and went to cath lab for RCA DES x 3.  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  Taken emergently to OR for Bentall+ ascending/arch replacement.  Procedure complicated by coagulopathy related to antiplatelet therapy from recent stent, multiple liters of blood products given.  Returned to ICU 4/16 on vent and PCCM consulted to assist with vent wean on 4/17.  Currently heavily sedated; attempts at weaning resulted in agitation without ability to redirect.  Pertinent  Medical History   Past Medical History:  Diagnosis Date   Hypertension     Significant Hospital Events: Including procedures, antibiotic start and stop dates in addition to other pertinent events   4/16 RCA stent then Bentall, aortic arch replacement 4/18 extubated 4/19 reintubated in afternoon for aspiration, bronch, Delirious, low grade fevers, expressive aphasia 4/20 but mental status precludes extubation 4/21 started spiking fever again, meropenem  switched back from Zosyn , on vancomycin  as well 02/07/22 failed spontaneous breathing trial 4/24 Tachypnea, fever. Resp culture sent  4/25 underwent right heart cath showing normal filling pressure 4/26 remained in A-fib, became afebrile, Precedex  was switched to low-dose propofol   Interim History / Subjective:  Remains off sedation. His eyes were open this morning when his wife arrived. Tmax 101.1.  Objective   Blood pressure (!) 113/57, pulse 79, temperature 98.5 F (36.9 C), temperature source Oral, resp. rate (!) 25, height 6\' 1"  (1.854 m), weight 128 kg, SpO2  97%. CVP:  [6 mmHg-15 mmHg] 12 mmHg  Vent Mode: PRVC FiO2 (%):  [40 %] 40 % Set Rate:  [20 bmp] 20 bmp Vt Set:  [570 mL] 570 mL PEEP:  [8 cmH20] 8 cmH20 Pressure Support:  [12 cmH20] 12 cmH20 Plateau Pressure:  [20 cmH20-25 cmH20] 25 cmH20   Intake/Output Summary (Last 24 hours) at 02/17/2024 0729 Last data filed at 02/17/2024 2841 Gross per 24 hour  Intake 5032.59 ml  Output 4225 ml  Net 807.59 ml   Filed Weights   02/14/24 0331 02/16/24 0500 02/17/24 0338  Weight: 130.3 kg 128.3 kg 128 kg    Examination: General: critically ill appearing man lying in bed in NAD HEENT: [place]/AT, eyes anicteric Neuro: RASS -1, not tracking but moving more spontaneously, grimacing Chest: breathing comfortably on MV, tachypnea, no rhonchi, reduced basilar breath sounds Heart: S1S2, RRR Abdomen: obese, soft, NT Skin: warm, dry, no rashes Extremities: ++edema  Na+ 152 K+ 3.3 BUN 114 Cr 1.82 Phos 5.1 Coox 71%  WBC 20.4 H/H 7.1/23.3 Platelets 783 BG 130-200s Trach aspirate culture: mod WBC, abundant candida albicans  Trach aspirate culture: abundant PMN, rare budding yeast> candida albicans  I/O net +800ccL  in last 24 hours, with UOP 4.2L + 12.7L since admission  Resolved Hospital Problem list   Constipation Shock combined cardiogenic/septic Hypokalemia/hypophosphatemia Shock liver, improving Perioperative coagulopathy  Assessment & Plan:  Acute inferior wall STEMI status post PCI and DES 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 Azotemia sowly worsening, but Cr improving -strict I/O -renally dose meds, avoid nephrotoxic meds -increase FWF -nephrology consult today for CRRT for azotemia, refractory hypervolemia  Hypernatremia, slowly improving -con't high dose FWF 300cc q2h   Acute encephalopathy, likely septic and uremia. Slowing waking up more. -PAD protocol; has been off IV sedation for 2 days. Stopping enterals. -head CT today  Perioperative acute blood loss anemia -transfuse for Hb <7 or hemodynamically significant bleeding  Obesity -TF, long-term recommend weight loss  Leukocytosis-finally improving; most significant finding on CT scan is RLL pneumonia, less impressive dependent findings in left lung -con't broad antimicrobials -change foley today -has picc x 8 days, but poor access for PIV until edema improves  Uncontrolled hyperglycemia; A1c 5.3 -glargine 12 units BID -TF coverage 5 units Q4h -SSI PRN -goal BG 140-180  Hypokalemia -repletion, monitor  Stage 1 sacral ulcer -turns, optimize nutrtiion   Ileus, resolved -decrease reglan  to once daily x 2 days, then stop if no return of ileus  Constipation, resolved -daily bowel regimen; titrate as needed   Wife updated at bedside. Discussed plan for head CT, likely nephrology consult for short-term dialysis. Trach planned for tomorrow.   Best Practice (right click and "Reselect all SmartList Selections" daily)   Diet/type: Tube feeds DVT prophylaxis heparin  gtt Pressure ulcer(s): pressure ulcer assessment deferred - stage 1 sacrum GI prophylaxis: PPI Lines: Central line picc Foley:  Yes, and it is still needed Code Status:  full code Last date of multidisciplinary goals of care discussion: 4/28: Met with patient's wife, SIL, brother to discuss trach-- they agreed for this.   This patient is critically ill with multiple organ system  failure which requires frequent high complexity decision making, assessment, support, evaluation, and titration of therapies. This was completed through the application of advanced monitoring technologies and extensive interpretation of multiple databases. During this encounter critical care time was devoted to patient care services described in this note for 40 minutes.  Joesph Mussel, DO 02/17/24 8:49 AM [facility]  For contact information, see Amion. If no response to pager, please call PCCM consult pager. After hours, 7PM- 7AM, please call Elink.

## 2024-02-17 NOTE — Progress Notes (Signed)
 Pt transported to CT and back to 2H24 without any complications.

## 2024-02-17 NOTE — TOC Progression Note (Signed)
 Transition of Care [facility]) - Progression Note    Patient Details  Name: Dustin Davenport MRN: [id number] Date of Birth: [birth year]  Transition of Care [facility] CM/SW Contact  Ernst Heap Phone Number: [phone] 02/17/2024, 11:02 AM  Clinical Narrative:   HF CSW and HF NCM still following patient will continue to follow patient to monitor dc needs.     TOC will continue to follow.     Expected Discharge Plan: Home w Home Health Services Barriers to Discharge: Continued Medical Work up  Expected Discharge Plan and Services   Discharge Planning Services: CM Consult   Living arrangements for the past 2 months: Single Family Home                                       Social Determinants of Health (SDOH) Interventions SDOH Screenings   Food Insecurity: No Food Insecurity (02/15/2024)  Housing: Low Risk  (02/15/2024)  Transportation Needs: No Transportation Needs (02/15/2024)  Utilities: Not At Risk (02/15/2024)  Social Connections: Patient Unable To Answer (02/02/2024)  Tobacco Use: Unknown (02/15/2024)    Readmission Risk Interventions     No data to display

## 2024-02-17 NOTE — Procedures (Signed)
 Central Venous Catheter Insertion Procedure Note  Dustin Davenport  [id number]  03-02-1956  Date:02/17/24  Time:2:31 PM   Provider Performing:Justyne Roell [name]   Procedure: Insertion of Non-tunneled Central Venous Catheter(36556)with US  guidance (08657)    Indication(s) Hemodialysis  Consent Risks of the procedure as well as the alternatives and risks of each were explained to the patient and/or caregiver.  Consent for the procedure was obtained and is signed in the bedside chart  Anesthesia Topical only with 1% lidocaine    Timeout Verified patient identification, verified procedure, site/side was marked, verified correct patient position, special equipment/implants available, medications/allergies/relevant history reviewed, required imaging and test results available.  Sterile Technique Maximal sterile technique including full sterile barrier drape, hand hygiene, sterile gown, sterile gloves, mask, hair covering, sterile ultrasound probe cover (if used).  Procedure Description Area of catheter insertion was cleaned with chlorhexidine  and draped in sterile fashion.   With real-time ultrasound guidance a HD catheter was placed into the right internal jugular vein.  Nonpulsatile blood flow and easy flushing noted in all ports.  Guidewire confirmed in compressible vessel prior to dilation. The catheter was sutured in place and sterile dressing applied.    Complications/Tolerance None; patient tolerated the procedure well. Chest X-ray is ordered to verify placement for internal jugular or subclavian cannulation.  Chest x-ray is not ordered for femoral cannulation.  EBL Minimal  Specimen(s) None  Dustin Mussel, DO 02/17/24 2:32 PM [facility]  For contact information, see Amion. If no response to pager, please call PCCM consult pager. After hours, 7PM- 7AM, please call Elink.

## 2024-02-17 NOTE — Consult Note (Signed)
 Reason for Consult:Renal failure Referring Physician:  Dr. Fulton Job  Chief Complaint: Chest pain  Assessment/Plan:  Acute renal failure secondary to ischemic ATN with refractory hypernatremia despite very aggressive free water  replacement every 2 hours as well as severely volume overloaded.  Patient is net +12 L during this hospitalization by what is recorded but on exam possibly even more - Initiating CRRT for renal failure, refractory hyponatremia and volume overload; CRRT as low efficiency and the hyponatremia should slowly correct with low risk of cerebral edema will monitor rate of correction closely.  Spouse was bedside, options discussed and she agrees to proceed with CRRT - Appreciate dialysis catheter placement by CCM  -Monitor Daily I/Os, Daily weight   -Maintain MAP>65 for optimal renal perfusion.  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 Anemia status post multiple units of blood products including RBCs, platelets, FFP and cryo. Acute systolic heart failure followed by advanced heart failure team treated with Lasix    HPI: Dustin Davenport is an 68 y.o. male with a history of hypertension, hyperlipidemia presenting with acute chest pain associated with shortness of breath and diaphoresis on the day of admission with EKG showing acute inferior ST elevation MI taken emergently to the Cath Lab showing diffuse RCA disease treated with DES x 3.  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  Taken emergently to the OR with aortic dissection repair, AVR and reimplantation of the coronaries on February 02, 2024. EF was down to 20 to 25% with moderate to severely  decreased RV as well.  Patient was extubated but reintubated 1 day later on April 19 with diffuse mucopurulent secretions on bronch treated with broad-spectrum antibiotics.  Atrial fibrillation treated with amiodarone .  Patient's baseline creatinine was 1.3-1.4 but peaked at 3.2.  ROS Pertinent items are noted in HPI.  Chemistry and CBC: Creatinine, Ser  Date/Time Value Ref Range Status  02/17/2024 05:03 AM 1.82 (H) 0.61 - 1.24 mg/dL Final  91/47/8295 62:13 AM 1.40 (H) 0.61 - 1.24 mg/dL Final  08/65/7846 96:29 PM 1.68 (H) 0.61 - 1.24 mg/dL Final  52/84/1324 40:10 AM 1.71 (H) 0.61 - 1.24 mg/dL Final  27/25/3664 40:34 AM 1.60 (H) 0.61 - 1.24 mg/dL Final  74/25/9563 87:56 AM 1.51 (H) 0.61 - 1.24 mg/dL Final  43/32/9518 84:16 AM 1.41 (H) 0.61 - 1.24 mg/dL Final  60/63/0160 10:93 AM 1.38 (H) 0.61 - 1.24 mg/dL Final  23/55/7322 02:54 PM 1.63 (H) 0.61 - 1.24 mg/dL Final  27/03/2375 28:31 AM 1.62 (H) 0.61 - 1.24 mg/dL Final  51/76/1607 37:10 AM 1.58 (H) 0.61 - 1.24 mg/dL Final  62/69/4854 62:70 PM 1.71 (H) 0.61 - 1.24 mg/dL Final  35/00/9381 82:99 AM 1.61 (H) 0.61 - 1.24 mg/dL Final  37/16/9678 93:81 AM 1.64 (H) 0.61 - 1.24 mg/dL Final  01/75/1025 85:27 PM 1.93 (H) 0.61 - 1.24 mg/dL Final  78/24/2353 61:44 AM 2.27 (H) 0.61 - 1.24 mg/dL Final  31/54/0086 76:19 AM 2.98 (H) 0.61 - 1.24 mg/dL Final  50/93/2671 24:58 AM 2.86 (H) 0.61 - 1.24 mg/dL Final  09/98/3382 50:53 AM 3.22 (H) 0.61 - 1.24 mg/dL Final  97/67/3419 37:90 AM 3.17 (H) 0.61 - 1.24 mg/dL Final  02/04/2024 05:37 PM 3.32 (H) 0.61 - 1.24 mg/dL Final  40/98/1191 47:82 AM 3.28 (H) 0.61 - 1.24 mg/dL Final  95/62/1308 65:78 AM 2.73 (H) 0.61 - 1.24 mg/dL Final  46/96/2952 84:13 PM 2.26 (H) 0.61 - 1.24 mg/dL Final  24/40/1027 25:36 PM 2.16 (H) 0.61 - 1.24 mg/dL Final  64/40/3474 25:95 AM 1.40 (H) 0.61 - 1.24 mg/dL Final  63/87/5643 32:95 AM 1.40 (H) 0.61 - 1.24 mg/dL Final  18/84/1660 63:01 AM 1.40 (H) 0.61 - 1.24 mg/dL Final  60/07/9322  55:73 AM 1.40 (H) 0.61 - 1.24 mg/dL Final  22/11/5425 06:23 AM 1.40 (H) 0.61 - 1.24 mg/dL Final  76/28/3151 76:16 AM 1.50 (H) 0.61 - 1.24 mg/dL Final  07/37/1062 69:48 PM 1.50 (H) 0.61 - 1.24 mg/dL Final  54/62/7035 00:93 PM 1.60 (H) 0.61 - 1.24 mg/dL Final  81/82/9937 16:96 PM 1.60 (H) 0.61 - 1.24 mg/dL Final  78/93/8101 75:10 PM 1.50 (H) 0.61 - 1.24 mg/dL Final  25/85/2778 24:23 PM 1.70 (H) 0.61 - 1.24 mg/dL Final  53/61/4431 54:00 PM 1.60 (H) 0.61 - 1.24 mg/dL Final  86/76/1950 93:26 AM 1.45 (H) 0.61 - 1.24 mg/dL Final  71/24/5809 98:33 AM 1.30 (H) 0.61 - 1.24 mg/dL Final  82/50/5397 67:34 AM 1.30 (H) 0.61 - 1.24 mg/dL Final  19/37/9024 09:73 AM 0.98 0.61 - 1.24 mg/dL Final   Recent Labs  Lab 02/11/24 0445 02/11/24 0922 02/12/24 0431 02/12/24 2335 02/13/24 0317 02/14/24 0358 02/15/24 0204 02/15/24 1400 02/16/24 0505 02/17/24 0503  NA 152*   < > 153* 152* 149* 149* 150* 154* 152* 152*  K 3.4*   < > 4.5 4.3 4.4 3.2* 3.0* 3.2* 3.6 3.3*  CL 118*  --  119*  --  117* 113* 110 115* 113* 113*  CO2 24  --  23  --  23 24 25 28 28 29   GLUCOSE 279*  --  212*  --  266* 280* 255* 194* 201* 200*  BUN 60*  --  62*  --  80* 88* 96* 99* 109* 114*  CREATININE 1.38*  --  1.41*  --  1.51* 1.60* 1.71* 1.68* 1.40* 1.82*  CALCIUM  7.2*  --  8.1*  --  8.1* 8.1* 8.3* 8.6* 8.5* 8.5*  PHOS 2.2*  --  4.5  --  4.2 3.6 3.8  --  4.8* 5.1*   < > = values in this interval not displayed.   Recent Labs  Lab 02/14/24 0358 02/15/24 0204 02/16/24 1052 02/17/24 0503  WBC 29.5* 30.2* 23.8* 20.4*  HGB 7.1* 7.4* 7.0* 7.1*  HCT 22.7* 23.9* 23.2* 23.3*  MCV 91.9 90.9 91.3 90.3  PLT 621* 708* 740* 783*   Liver Function Tests: Recent Labs  Lab 02/15/24 0204 02/16/24 0505 02/17/24 0503  ALBUMIN  2.0* 1.8* 1.8*   No results for input(s): "LIPASE", "AMYLASE" in the last 168 hours. No results for input(s): "AMMONIA" in the last 168 hours. Cardiac Enzymes: No results for input(s): "CKTOTAL", "CKMB",  "CKMBINDEX", "TROPONINI" in the last 168 hours. Iron Studies: No results for input(s): "IRON", "TIBC", "TRANSFERRIN", "FERRITIN" in the last 72 hours. PT/INR: @LABRCNTIP (inr:5)  Xrays/Other Studies: ) Results for orders placed or performed during the hospital encounter of 02/01/24 (from the past 48 hours)  Basic metabolic panel with GFR     Status: Abnormal   Collection Time: 02/15/24  2:00 PM  Result Value Ref Range   Sodium 154 (H) 135 - 145 mmol/L   Potassium 3.2 (L) 3.5 - 5.1 mmol/L   Chloride 115 (H)  98 - 111 mmol/L   CO2 28 22 - 32 mmol/L   Glucose, Bld 194 (H) 70 - 99 mg/dL    Comment: Glucose reference range applies only to samples taken after fasting for at least 8 hours.   BUN 99 (H) 8 - 23 mg/dL   Creatinine, Ser 1.61 (H) 0.61 - 1.24 mg/dL   Calcium  8.6 (L) 8.9 - 10.3 mg/dL   GFR, Estimated 44 (L) >60 mL/min    Comment: (NOTE) Calculated using the CKD-EPI Creatinine Equation (2021)    Anion gap 11 5 - 15    Comment: Performed at [facility], [address] [place], [place] [address]  Glucose, capillary     Status: Abnormal   Collection Time: 02/15/24  3:54 PM  Result Value Ref Range   Glucose-Capillary 176 (H) 70 - 99 mg/dL    Comment: Glucose reference range applies only to samples taken after fasting for at least 8 hours.  Glucose, capillary     Status: Abnormal   Collection Time: 02/15/24  7:55 PM  Result Value Ref Range   Glucose-Capillary 173 (H) 70 - 99 mg/dL    Comment: Glucose reference range applies only to samples taken after fasting for at least 8 hours.  Glucose, capillary     Status: Abnormal   Collection Time: 02/16/24 12:12 AM  Result Value Ref Range   Glucose-Capillary 166 (H) 70 - 99 mg/dL    Comment: Glucose reference range applies only to samples taken after fasting for at least 8 hours.  Glucose, capillary     Status: Abnormal   Collection Time: 02/16/24  4:00 AM  Result Value Ref Range   Glucose-Capillary 169 (H) 70 - 99 mg/dL     Comment: Glucose reference range applies only to samples taken after fasting for at least 8 hours.  Cooxemetry Panel (carboxy, met, total hgb, O2 sat)     Status: Abnormal   Collection Time: 02/16/24  5:05 AM  Result Value Ref Range   Total hemoglobin 7.2 (L) 12.0 - 16.0 g/dL   O2 Saturation 54.0 %   Carboxyhemoglobin 1.3 0.5 - 1.5 %   Methemoglobin <0.7 0.0 - 1.5 %    Comment: Performed at [facility], [address] [place], [place] [address]  Renal function panel     Status: Abnormal   Collection Time: 02/16/24  5:05 AM  Result Value Ref Range   Sodium 152 (H) 135 - 145 mmol/L   Potassium 3.6 3.5 - 5.1 mmol/L   Chloride 113 (H) 98 - 111 mmol/L   CO2 28 22 - 32 mmol/L   Glucose, Bld 201 (H) 70 - 99 mg/dL    Comment: Glucose reference range applies only to samples taken after fasting for at least 8 hours.   BUN 109 (H) 8 - 23 mg/dL   Creatinine, Ser 1.47 (H) 0.61 - 1.24 mg/dL   Calcium  8.5 (L) 8.9 - 10.3 mg/dL   Phosphorus 4.8 (H) 2.5 - 4.6 mg/dL   Albumin  1.8 (L) 3.5 - 5.0 g/dL   GFR, Estimated 55 (L) >60 mL/min    Comment: (NOTE) Calculated using the CKD-EPI Creatinine Equation (2021)    Anion gap 11 5 - 15    Comment: Performed at [facility], [address], [place], [place] [address]  Magnesium      Status: Abnormal   Collection Time: 02/16/24  5:05 AM  Result Value Ref Range   Magnesium  2.8 (H) 1.7 - 2.4 mg/dL  Comment: Performed at [facility], [address] [place], [place] [address]  Heparin  level (unfractionated)     Status: None   Collection Time: 02/16/24  5:05 AM  Result Value Ref Range   Heparin  Unfractionated 0.33 0.30 - 0.70 IU/mL    Comment: (NOTE) The clinical reportable range upper limit is being lowered to >1.10 to align with the FDA approved guidance for the current laboratory assay.  If heparin  results are below expected values, and patient dosage has  been confirmed, suggest follow up testing of antithrombin III  levels. Performed at [facility], [address] [place], [place] [address]   Glucose, capillary     Status: Abnormal   Collection Time: 02/16/24  9:01 AM  Result Value Ref Range   Glucose-Capillary 132 (H) 70 - 99 mg/dL    Comment: Glucose reference range applies only to samples taken after fasting for at least 8 hours.  Glucose, capillary     Status: Abnormal   Collection Time: 02/16/24 10:31 AM  Result Value Ref Range   Glucose-Capillary 147 (H) 70 - 99 mg/dL    Comment: Glucose reference range applies only to samples taken after fasting for at least 8 hours.  CBC     Status: Abnormal   Collection Time: 02/16/24 10:52 AM  Result Value Ref Range   WBC 23.8 (H) 4.0 - 10.5 K/uL   RBC 2.54 (L) 4.22 - 5.81 MIL/uL   Hemoglobin 7.0 (L) 13.0 - 17.0 g/dL   HCT 09.8 (L) 11.9 - 14.7 %   MCV 91.3 80.0 - 100.0 fL   MCH 27.6 26.0 - 34.0 pg   MCHC 30.2 30.0 - 36.0 g/dL   RDW 82.9 (H) 56.2 - 13.0 %   Platelets 740 (H) 150 - 400 K/uL   nRBC 25.7 (H) 0.0 - 0.2 %    Comment: Performed at [facility], [address], [place], [place] [address]  Glucose, capillary     Status: Abnormal   Collection Time: 02/16/24 11:53 AM  Result Value Ref Range   Glucose-Capillary 140 (H) 70 - 99 mg/dL    Comment: Glucose reference range applies only to samples taken after fasting for at least 8 hours.  Glucose, capillary     Status: Abnormal   Collection Time: 02/16/24  4:05 PM  Result Value Ref Range   Glucose-Capillary 126 (H) 70 - 99 mg/dL    Comment: Glucose reference range applies only to samples taken after fasting for at least 8 hours.  Glucose, capillary     Status: Abnormal   Collection Time: 02/16/24  7:40 PM  Result Value Ref Range   Glucose-Capillary 155 (H) 70 - 99 mg/dL    Comment: Glucose reference range applies only to samples taken after fasting for at least 8 hours.  Glucose, capillary     Status: Abnormal   Collection Time: 02/16/24 11:37 PM  Result Value Ref Range    Glucose-Capillary 145 (H) 70 - 99 mg/dL    Comment: Glucose reference range applies only to samples taken after fasting for at least 8 hours.  Glucose, capillary     Status: Abnormal   Collection Time: 02/17/24  3:06 AM  Result Value Ref Range   Glucose-Capillary 137 (H) 70 - 99 mg/dL    Comment: Glucose reference range applies only to samples taken after fasting for at least 8 hours.  Cooxemetry Panel (carboxy, met, total hgb, O2 sat)     Status: Abnormal  Collection Time: 02/17/24  5:03 AM  Result Value Ref Range   Total hemoglobin 7.4 (L) 12.0 - 16.0 g/dL   O2 Saturation 71 %   Carboxyhemoglobin 1.5 0.5 - 1.5 %   Methemoglobin <0.7 0.0 - 1.5 %    Comment: Performed at [facility], [address] [place], [place] [address]  Renal function panel     Status: Abnormal   Collection Time: 02/17/24  5:03 AM  Result Value Ref Range   Sodium 152 (H) 135 - 145 mmol/L   Potassium 3.3 (L) 3.5 - 5.1 mmol/L   Chloride 113 (H) 98 - 111 mmol/L   CO2 29 22 - 32 mmol/L   Glucose, Bld 200 (H) 70 - 99 mg/dL    Comment: Glucose reference range applies only to samples taken after fasting for at least 8 hours.   BUN 114 (H) 8 - 23 mg/dL   Creatinine, Ser 1.91 (H) 0.61 - 1.24 mg/dL   Calcium  8.5 (L) 8.9 - 10.3 mg/dL   Phosphorus 5.1 (H) 2.5 - 4.6 mg/dL   Albumin  1.8 (L) 3.5 - 5.0 g/dL   GFR, Estimated 40 (L) >60 mL/min    Comment: (NOTE) Calculated using the CKD-EPI Creatinine Equation (2021)    Anion gap 10 5 - 15    Comment: Performed at [facility], [address] [place], [place] [address]  Heparin  level (unfractionated)     Status: None   Collection Time: 02/17/24  5:03 AM  Result Value Ref Range   Heparin  Unfractionated 0.38 0.30 - 0.70 IU/mL    Comment: (NOTE) The clinical reportable range upper limit is being lowered to >1.10 to align with the FDA approved guidance for the current laboratory assay.  If heparin  results are below expected values, and patient dosage has   been confirmed, suggest follow up testing of antithrombin III levels. Performed at [facility], [address] [place], [place] [address]   CBC     Status: Abnormal   Collection Time: 02/17/24  5:03 AM  Result Value Ref Range   WBC 20.4 (H) 4.0 - 10.5 K/uL   RBC 2.58 (L) 4.22 - 5.81 MIL/uL   Hemoglobin 7.1 (L) 13.0 - 17.0 g/dL   HCT 08.6 (L) 57.8 - 46.9 %   MCV 90.3 80.0 - 100.0 fL   MCH 27.5 26.0 - 34.0 pg   MCHC 30.5 30.0 - 36.0 g/dL   RDW 62.9 (H) 52.8 - 41.3 %   Platelets 783 (H) 150 - 400 K/uL   nRBC 33.8 (H) 0.0 - 0.2 %    Comment: REPEATED TO VERIFY Performed at [facility], [address], [place], [place] [address]   Glucose, capillary     Status: Abnormal   Collection Time: 02/17/24  7:39 AM  Result Value Ref Range   Glucose-Capillary 138 (H) 70 - 99 mg/dL    Comment: Glucose reference range applies only to samples taken after fasting for at least 8 hours.  Glucose, capillary     Status: Abnormal   Collection Time: 02/17/24 11:16 AM  Result Value Ref Range   Glucose-Capillary 126 (H) 70 - 99 mg/dL    Comment: Glucose reference range applies only to samples taken after fasting for at least 8 hours.   DG CHEST PORT 1 VIEW Result Date: 02/17/2024 CLINICAL DATA:  Hypoxia EXAM: PORTABLE CHEST 1 VIEW COMPARISON:  02/13/2024 FINDINGS: Right PICC, endotracheal tube and feeding catheter are again noted and stable.  Cardiac shadow is enlarged but stable. Postsurgical changes are again noted. Persistent left retrocardiac opacity is seen. Small left effusion is noted. No bony abnormality is noted. IMPRESSION: Stable left lower lobe foot opacity with small effusion. Electronically Signed   By: Violeta Grey M.D.   On: 02/17/2024 09:24    PMH:   Past Medical History:  Diagnosis Date   Hypertension     PSH:   Past Surgical History:  Procedure Laterality Date   BENTALL PROCEDURE N/A 02/01/2024   Procedure: BENTALL PROCEDURE USING A KONECT RESILIA AORTIC VALVE  CONDUIT;  Surgeon: Bartley Lightning, MD;  Location: [facility] OR;  Service: Open Heart Surgery;  Laterality: N/A;   CORONARY/GRAFT ACUTE MI REVASCULARIZATION N/A 02/01/2024   Procedure: Coronary/Graft Acute MI Revascularization;  Surgeon: [place]az[place], Peter M, MD;  Location: [facility] INVASIVE CV LAB;  Service: Cardiovascular;  Laterality: N/A;   INTRAOPERATIVE TRANSESOPHAGEAL ECHOCARDIOGRAM N/A 02/01/2024   Procedure: ECHOCARDIOGRAM, TRANSESOPHAGEAL, INTRAOPERATIVE;  Surgeon: Bartley Lightning, MD;  Location: [facility] OR;  Service: Open Heart Surgery;  Laterality: N/A;   RIGHT HEART CATH N/A 02/11/2024   Procedure: RIGHT HEART CATH;  Surgeon: [name], MD;  Location: [facility] INVASIVE CV LAB;  Service: Cardiovascular;  Laterality: N/A;   THORACIC AORTIC ANEURYSM REPAIR N/A 02/01/2024   Procedure: REPAIR OF ASCENDING AORTIC ANEURYSM USING A [id number] HEMASHIELD PLATINUM GRAFT.;  Surgeon: Bartley Lightning, MD;  Location: [facility] OR;  Service: Open Heart Surgery;  Laterality: N/A;  Repair of Aortic Dissection, Right Axillary Cannulation.    Allergies:  Allergies  Allergen Reactions   Bee Venom     Medications:   Prior to Admission medications   Medication Sig Start Date End Date Taking? Authorizing Provider  acetaminophen  (TYLENOL ) 500 MG tablet Take 1,000 mg by mouth 2 (two) times daily as needed for mild pain (pain score 1-3), moderate pain (pain score 4-6), fever or headache.   Yes [provider]  chlorthalidone (HYGROTON) 25 MG tablet Take 25 mg by mouth daily.   Yes [provider]  losartan (COZAAR) 100 MG tablet Take 50 mg by mouth daily. 11/22/23  Yes [provider]  metoprolol  succinate (TOPROL -XL) 100 MG 24 hr tablet Take 50 mg by mouth daily. Take with or immediately following a meal.   Yes [provider]  potassium chloride  (KLOR-CON ) 10 MEQ tablet Take 10 mEq by mouth daily. 01/17/24  Yes [provider]    Discontinued Meds:   Medications Discontinued During  This Encounter  Medication Reason   iohexol  (OMNIPAQUE ) 350 MG/ML injection Patient Discharge   norepinephrine  (LEVOPHED ) 4mg  in (0.016 mg/mL) premix infusion Patient Discharge   prasugrel  (EFFIENT ) tablet Patient Discharge   nitroGLYCERIN  1 mg/10 mL (100 mcg/mL) - IR/CATH LAB Patient Discharge   heparin  sodium (porcine) injection Patient Discharge   midazolam  (VERSED ) injection Patient Discharge   fentaNYL  (SUBLIMAZE ) injection Patient Discharge   Radial Cocktail/Verapamil  only Patient Discharge   Heparin  (Porcine) in NaCl 1000-0.9 UT/500ML-% SOLN Patient Discharge   lidocaine  (PF) (XYLOCAINE ) 1 % injection Patient Discharge   losartan (COZAAR) 50 MG tablet Dose change   acetaminophen  (TYLENOL ) tablet 650 mg    ondansetron  (ZOFRAN ) injection 4 mg    enoxaparin  (LOVENOX ) injection 40 mg    aspirin  EC 81 MG tablet Discontinued by provider   bismuth subsalicylate (PEPTO BISMOL) 262 MG/15ML suspension Patient has not taken in last 30 days   glucosamine-chondroitin 500-400 MG tablet Patient Preference   pantoprazole  (PROTONIX ) 20 MG tablet Completed  Course   potassium chloride  (K-DUR,KLOR-CON ) 10 MEQ tablet Duplicate   propofol  (DIPRIVAN ) 10 mg/mL bolus/IV push 60.1 mg    prasugrel  (EFFIENT ) tablet 10 mg    coagulation factor VIIa  recomb (NOVOSEVEN ) injection 5,000 mcg    heparin  sodium (porcine) 2,500 Units, papaverine 30 mg in electrolyte-A (PLASMALYTE-A PH 7.4) 500 mL irrigation Patient Discharge   hemostatic agents (no charge) Optime Patient Discharge   thrombin  recombinant (RECOTHROM ) solution syringe Patient Discharge   Surgifoam 1 Gm with Thrombin  20,000 units (20 ml) topical solution Patient Discharge   0.9 % irrigation (POUR BTL) Patient Discharge   milrinone  (PRIMACOR ) 20 MG/100 ML (0.2 mg/mL) infusion Patient Transfer   nitroGLYCERIN  50 mg in dextrose  5 % 250 mL (0.2 mg/mL) infusion Patient Transfer   phenylephrine  (NEO-SYNEPHRINE) 20mg /NS premix infusion Patient  Transfer   potassium chloride  injection 80 mEq Patient Transfer   heparin  30,000 units/NS 1000 mL solution for CELLSAVER Patient Transfer   heparin  sodium (porcine) 2,500 Units, papaverine 30 mg in electrolyte-A (PLASMALYTE-A PH 7.4) 500 mL irrigation Patient Transfer   tranexamic acid  (CYKLOKAPRON ) pump prime solution 240 mg Patient Transfer   ceFAZolin  (ANCEF ) IVPB 2g/100 mL premix Patient Transfer   [facility] Blood Cardioplegia vial (lidocaine /magnesium /mannitol  0.26g-4g-6.4g) Patient Transfer   tranexamic acid  (CYKLOKAPRON ) 2,500 mg in sodium chloride  0.9 % 250 mL (10 mg/mL) infusion Patient Transfer   vasopressin  (PITRESSIN) 20 Units in 100 mL (0.2 unit/mL) infusion-*FOR SHOCK* Patient Transfer   0.9 %  sodium chloride  infusion Patient Transfer   pantoprazole  (PROTONIX ) EC tablet 20 mg    nitroGLYCERIN  (NITROSTAT ) SL tablet 0.4 mg    ondansetron  (ZOFRAN ) injection 4 mg    acetaminophen  (TYLENOL ) tablet 650 mg    enoxaparin  (LOVENOX ) injection 40 mg    sodium chloride  flush (NS) 0.9 % injection 3 mL    sodium chloride  flush (NS) 0.9 % injection 3 mL    0.9 %  sodium chloride  infusion    aspirin  chewable tablet 81 mg    potassium chloride  SA (KLOR-CON  M) CR tablet 20 mEq    magnesium  sulfate IVPB 2 g 50 mL    0.9 %  sodium chloride  infusion    norepinephrine  (LEVOPHED ) 4mg  in (0.016 mg/mL) premix infusion    0.9 %  sodium chloride  infusion (Manually program via Guardrails IV Fluids)    0.9 %  sodium chloride  infusion (Manually program via Guardrails IV Fluids)    0.9 %  sodium chloride  infusion (Manually program via Guardrails IV Fluids)    potassium chloride  10 mEq in 50 mL *CENTRAL LINE* IVPB    metoprolol  tartrate (LOPRESSOR ) tablet 12.5 mg    metoprolol  tartrate (LOPRESSOR ) 25 mg/10 mL oral suspension 12.5 mg    norepinephrine  (LEVOPHED ) 4mg  in (0.016 mg/mL) premix infusion    potassium chloride  10 mEq in 50 mL *CENTRAL LINE* IVPB    aspirin  EC tablet 325 mg     aspirin  chewable tablet 324 mg    sodium bicarbonate  150 mEq in sterile water  1,150 mL infusion    metoprolol  tartrate (LOPRESSOR ) injection 2.5-5 mg    free water  200 mL    docusate sodium  (COLACE) capsule 200 mg    morphine  (PF) 2 MG/ML injection 1-4 mg    pantoprazole  (PROTONIX ) injection 40 mg    pantoprazole  (PROTONIX ) EC tablet 40 mg    traMADol  (ULTRAM ) tablet 50-100 mg    atorvastatin  (LIPITOR) tablet 80 mg    fentaNYL  in NS (50mcg/ml) infusion-PREMIX    nitroGLYCERIN   50 mg in dextrose  5 % 250 mL (0.2 mg/mL) infusion    albumin  human 5 % solution 12.5 g    insulin  regular, human (MYXREDLIN ) 100 units/ 100 mL infusion    dextrose  50 % solution 0-50 mL    furosemide  (LASIX ) 120 mg in dextrose  5 % 50 mL IVPB    metolazone  (ZAROXOLYN ) tablet 5 mg Change in therapy   bisacodyl  (DULCOLAX) EC tablet 10 mg    bisacodyl  (DULCOLAX) suppository 10 mg    senna-docusate (Senokot-S) tablet 2 tablet    oxyCODONE  (Oxy IR/ROXICODONE ) immediate release tablet 5-10 mg    midazolam  (VERSED ) injection 2 mg    fentaNYL  in NS 250mL (10mcg/ml) infusion-PREMIX    aspirin  tablet 325 mg    acetaminophen  (TYLENOL ) tablet 1,000 mg    acetaminophen  (TYLENOL ) 160 MG/5ML solution 1,000 mg    fentaNYL  (SUBLIMAZE ) bolus via infusion 50-100 mcg    dexmedetomidine  (PRECEDEX ) 400 MCG/100ML (4 mcg/mL) infusion    fentaNYL  (SUBLIMAZE ) injection 12.5-50 mcg    methocarbamol  (ROBAXIN ) injection 500 mg    furosemide  (LASIX ) 120 mg in dextrose  5 % 50 mL IVPB    insulin  glargine-yfgn (SEMGLEE ) injection 25 Units    fentaNYL  (SUBLIMAZE ) injection 50-100 mcg    fentaNYL  (SUBLIMAZE ) injection 25 mcg    ketamine  HCl 50 MG/5ML SOSY Returned to ADS   succinylcholine  (ANECTINE ) 200 MG/10ML syringe Returned to ADS   insulin  glargine-yfgn (SEMGLEE ) injection 25 Units    insulin  aspart (novoLOG ) injection 0-20 Units    0.9 %  sodium chloride  infusion (Manually program via Guardrails IV Fluids)     potassium chloride  10 mEq in 100 mL IVPB    vasopressin  (PITRESSIN) 20 Units in 100 mL (0.2 unit/mL) infusion-*FOR SHOCK*    EPINEPHrine  (ADRENALIN ) 5 mg in NS 250 mL (0.02 mg/mL) premix infusion    norepinephrine  (LEVOPHED ) 16 mg in 250mL (0.064 mg/mL) premix infusion    metoprolol  tartrate (LOPRESSOR ) injection 2.5-5 mg    aspirin  suppository 300 mg    potassium chloride  10 mEq in 50 mL *CENTRAL LINE* IVPB    sodium chloride  flush (NS) 0.9 % injection 3 mL    sodium chloride  flush (NS) 0.9 % injection 3 mL    sodium chloride  flush (NS) 0.9 % injection 3-10 mL    sodium chloride  flush (NS) 0.9 % injection 3-10 mL    sodium chloride  flush (NS) 0.9 % injection 10-40 mL    sodium chloride  flush (NS) 0.9 % injection 10-40 mL    meropenem  (MERREM ) 1 g in sodium chloride  0.9 % 100 mL IVPB    vancomycin  variable dose per unstable renal function (pharmacist dosing)    lactulose  (CHRONULAC ) 10 GM/15ML solution 20 g    furosemide  (LASIX ) injection 60 mg    feeding supplement (PROSource TF20) liquid 60 mL    lactulose  (CHRONULAC ) 10 GM/15ML solution 20 g    bisacodyl  (DULCOLAX) suppository 10 mg    potassium chloride  (KLOR-CON ) packet 40 mEq    free water  100 mL    piperacillin -tazobactam (ZOSYN ) IVPB 3.375 g Discontinued by provider   dexmedetomidine  (PRECEDEX ) 400 MCG/100ML (4 mcg/mL) infusion    acetaminophen  (TYLENOL ) 160 MG/5ML solution 650 mg    free water  100 mL    insulin  aspart (novoLOG ) injection 0-9 Units    clonazePAM  (KLONOPIN ) tablet 1 mg    clonazePAM  (KLONOPIN ) tablet 1 mg    amiodarone  (NEXTERONE  PREMIX) 360-4.14 MG/200ML-% (1.8 mg/mL) IV infusion    amiodarone  (PACERONE ) tablet 400 mg  amiodarone  (PACERONE ) tablet 400 mg    senna-docusate (Senokot-S) tablet 1 tablet    EPINEPHrine  (ADRENALIN ) 5 mg in NS 250 mL (0.02 mg/mL) premix infusion    magnesium  sulfate IVPB 2 g 50 mL    midazolam  (VERSED ) injection 1-2 mg    Heparin  (Porcine) in NaCl 1000-0.9 UT/500ML-% SOLN  Patient Discharge   lidocaine  (PF) (XYLOCAINE ) 1 % injection Patient Discharge   0.9 %  sodium chloride  infusion Patient Transfer   aspirin  chewable tablet 81 mg    clopidogrel  (PLAVIX ) tablet 75 mg    atorvastatin  (LIPITOR) tablet 80 mg    dexmedetomidine  (PRECEDEX ) 400 MCG/100ML (4 mcg/mL) infusion    fentaNYL  in NS (38mcg/ml) infusion-PREMIX    QUEtiapine  (SEROQUEL ) tablet 50 mg    atorvastatin  (LIPITOR) tablet 80 mg    norepinephrine  (LEVOPHED ) 4mg  in (0.016 mg/mL) premix infusion    aspirin  chewable tablet 81 mg    insulin  glargine-yfgn (SEMGLEE ) injection 5 Units    potassium chloride  (KLOR-CON ) packet 20 mEq    norepinephrine  (LEVOPHED ) 4mg  in (0.016 mg/mL) premix infusion    fentaNYL  (SUBLIMAZE ) bolus via infusion 100 mcg    feeding supplement (VITAL 1.5 CAL) liquid 1,000 mL    feeding supplement (PROSource TF20) liquid 60 mL    vancomycin  (VANCOREADY) IVPB 1750 mg/350 mL    methocarbamol  (ROBAXIN ) injection 500 mg    polyethylene glycol (MIRALAX  / GLYCOLAX ) packet 17 g    free water  200 mL    insulin  aspart (novoLOG ) injection 2 Units    furosemide  (LASIX ) injection 80 mg    metoCLOPramide  (REGLAN ) injection 5 mg    furosemide  (LASIX ) injection 80 mg    polyethylene glycol (MIRALAX  / GLYCOLAX ) packet 17 g    clonazePAM  (KLONOPIN ) tablet 1 mg    QUEtiapine  (SEROQUEL ) tablet 100 mg    oxyCODONE  (Oxy IR/ROXICODONE ) immediate release tablet 5 mg    free water  200 mL    meropenem  (MERREM ) 1 g in sodium chloride  0.9 % 100 mL IVPB    meropenem  (MERREM ) 15 g in sodium chloride  0.9 % 100 mL IVPB    clonazePAM  (KLONOPIN ) tablet 0.5 mg    oxyCODONE  (Oxy IR/ROXICODONE ) immediate release tablet 5 mg    QUEtiapine  (SEROQUEL ) tablet 50 mg    metoCLOPramide  (REGLAN ) injection 5 mg    fentaNYL  (SUBLIMAZE ) bolus via infusion 25-100 mcg    vancomycin  (VANCOREADY) IVPB 1500 mg/300 mL    meropenem  (MERREM ) 1 g in sodium chloride  0.9 % 100 mL IVPB    micafungin   (MYCAMINE ) 100 mg in sodium chloride  0.9 % 100 mL IVPB     Social History:  reports that he has never smoked. He does not have any smokeless tobacco history on file. He reports that he does not currently use alcohol . He reports current drug use. Drug: Amphetamines.  Family History:  No family history on file.  Blood pressure (!) 105/48, pulse 82, temperature 99.3 F (37.4 C), temperature source Axillary, resp. rate (!) 37, height 6\' 1"  (1.854 m), weight 128 kg, SpO2 92%. General appearance: intubated Head: Normocephalic, without obvious abnormality, atraumatic Neck: no adenopathy, no carotid bruit, supple, symmetrical, trachea midline, and thyroid not enlarged, symmetric, no tenderness/mass/nodules Back: negative Resp: Mechanical breath sounds Cardio: regular rate and rhythm GI: soft, obese + BS Extremities: edema 2+ all four ext Pulses: 2+ and symmetric Skin: Skin color, texture, turgor normal. No rashes or lesions       Sharron Petruska, Alveda Aures, MD 02/17/2024, 11:59 AM

## 2024-02-17 NOTE — Progress Notes (Signed)
 Advanced Heart Failure Rounding Note  Cardiologist: None   Chief Complaint: STEMI> Cardiogenic shock> Now s/p aortic dissection repair + Bentall  Patient Profile   Dustin Davenport is a 68 y.o. male with HTN and HLD. Admitted with STEMI now with CGS. Found to have AAA and taken emergently to the OR. Now s/p Bentall/AVR and reimplantation of coronaries.  Significant events:    - Echo 4/15: AAA measuring 5-5.7 cm. Followed by emergent TEE 4/15 which was significant for torrential aortic regurgitation and dissection flap extending from the aortic root to the proximal ascending aorta  - CT C/A/P: Showed acute type A dissection with a 5.5 cm ascending aortic aneurysm extending into arch. Dissection goes down to infrarenal aorta and extends up into innominate and left common carotid artery - 4/16 Underwent aortic dissection repair (Bentall/AVR and reimplantation of coronaries. Intra-op course c/b coagulopathy due to Effient  (PCI of RCA earlier in the day)).  - Extubated 4/18 - Re-intubated 4/19. Bronch with diffuse mucopurulent secretions - 4/26 Afib with post conversion pause. Amiodarone  decreased.  - 4/28 Started back on  Vanc + Meropenum and Micafungin  added 4/28 per CCM. Resp Cx + Candida   - 4/28 LLE Venous Doppler +DVT (gastroc vein), LUE Venous Doppler + for superficial cephalic vein thrombosis   Subjective:    Remains intubated. Off sedation. Not waking up but grimaces to painful stimuli. BUN now up to 114.   4L in UOP yesterday but overall net + for the day. C/w marked volume overload.  SCr 1.4>>1.8 BUN 114  K 3.3   On NE 6, Co-ox 71%.   WBC decreasing, 30>>23>>20K. RespCx + Candida, covering w/ micafungin . Vanc/Mero discontinued.    In NSR this morning. HR 80s   Objective:    Weight Range: 128 kg Body mass index is 37.23 kg/m.   Vital Signs:   Temp:  [97.5 F (36.4 C)-101.1 F (38.4 C)] 98.8 F (37.1 C) (05/01 0700) Pulse Rate:  [58-126] 79 (05/01 0645) Resp:   [21-44] 25 (05/01 0645) BP: (82-128)/(41-79) 113/57 (05/01 0645) SpO2:  [86 %-99 %] 97 % (05/01 0645) FiO2 (%):  [40 %] 40 % (05/01 0207) Weight:  [128 kg] 128 kg (05/01 0338) Last BM Date : 02/16/24  Weight change: Filed Weights   02/14/24 0331 02/16/24 0500 02/17/24 0338  Weight: 130.3 kg 128.3 kg 128 kg   Intake/Output:  Intake/Output Summary (Last 24 hours) at 02/17/2024 0907 Last data filed at 02/17/2024 0654 Gross per 24 hour  Intake 5032.59 ml  Output 4225 ml  Net 807.59 ml    Physical Exam   General:  intubated. Grimaces to painful stimuli  HEENT: + ETT  Neck: JVD elevated to jaw  Cor: Regular rate & rhythm.  Lungs: intubated and clear  Abdomen: obese, soft, NT Extremities: no cyanosis, clubbing, rash, 2-3+ b/l lower and upper ext edema Neuro: intubated. Grimaces to painful stimuli  GU: + foley   Telemetry   NSR 80s, personally reviewed   Labs    CBC Recent Labs    02/16/24 1052 02/17/24 0503  WBC 23.8* 20.4*  HGB 7.0* 7.1*  HCT 23.2* 23.3*  MCV 91.3 90.3  PLT 740* 783*   Basic Metabolic Panel Recent Labs    28/41/32 0204 02/15/24 1400 02/16/24 0505 02/17/24 0503  NA 150*   < > 152* 152*  K 3.0*   < > 3.6 3.3*  CL 110   < > 113* 113*  CO2 25   < >  28 29  GLUCOSE 255*   < > 201* 200*  BUN 96*   < > 109* 114*  CREATININE 1.71*   < > 1.40* 1.82*  CALCIUM  8.3*   < > 8.5* 8.5*  MG 2.6*  --  2.8*  --   PHOS 3.8  --  4.8* 5.1*   < > = values in this interval not displayed.   Liver Function Tests Recent Labs    02/16/24 0505 02/17/24 0503  ALBUMIN  1.8* 1.8*   D-Dimer No results for input(s): "DDIMER" in the last 72 hours.  Hemoglobin A1C No results for input(s): "HGBA1C" in the last 72 hours.  Medications:    Scheduled Medications:  Chlorhexidine  Gluconate Cloth  6 each Topical Daily   clopidogrel   75 mg Per Tube Daily   feeding supplement (PROSource TF20)  60 mL Per Tube QID   free water   300 mL Per Tube Q2H   insulin  aspart  0-20  Units Subcutaneous Q4H   insulin  aspart  5 Units Subcutaneous Q4H   insulin  glargine-yfgn  12 Units Subcutaneous BID   metoCLOPramide  (REGLAN ) injection  5 mg Intravenous Daily   mouth rinse  15 mL Mouth Rinse Q2H   pantoprazole  (PROTONIX ) IV  40 mg Intravenous QHS   polyethylene glycol  17 g Per Tube Daily   potassium chloride   40 mEq Per Tube Q4H   rosuvastatin   40 mg Per Tube Daily   sodium chloride  flush  10-40 mL Intracatheter Q12H   Infusions:  amiodarone  30 mg/hr (02/17/24 0654)   [START ON 02/18/2024] desmopressin  (DDAVP ) 20 mcg in sodium chloride  0.9 % 50 mL IVPB     feeding supplement (VITAL 1.5 CAL) 55 mL/hr at 02/17/24 0654   heparin  1,600 Units/hr (02/17/24 0654)   micafungin  (MYCAMINE ) 150 mg in sodium chloride  0.9 % 100 mL IVPB     norepinephrine  (LEVOPHED ) Adult infusion 6 mcg/min (02/17/24 0654)   potassium chloride  50 mL/hr at 02/17/24 0736   propofol  (DIPRIVAN ) infusion Stopped (02/15/24 0754)   PRN Medications: acetaminophen  (TYLENOL ) oral liquid 160 mg/5 mL, albuterol , fentaNYL  (SUBLIMAZE ) injection, midazolam , ondansetron  (ZOFRAN ) IV, mouth rinse, polyvinyl alcohol , sodium chloride  flush   Assessment/Plan   Acute type A aortic dissection  - Echo 4/15: AAA measuring 5-5.7 cm - Emergent TEE 4/15: significant for torrential aortic regurgitation and dissection flap extending from the aortic root to the proximal ascending aorta  - CT C/A/P: Showed acute type A dissection with a 5.5 cm ascending aortic aneurysm extending into arch. Dissection goes down to infrarenal aorta and extends up into innominate and left common carotid arteries  - Now s/p aortic dissection repair 02/01/24 (Bentall/AVR and reimplantation of coronaries.  2. CAD, Acute inferior STEMI - Admitted with STEMI. Post PCI found to also have aortic dissection - LHC with single vessel CAD involving the mid to distal RCA, successful PCI of the RCA with overlapping DES x 3. - No aspirin  with heparin  gtt -  Continue plavix  75mg  daily - Continue Crestor  40 mg daily  3. Acute systolic HF ->  Cardiogenic shock - Post perfusion CGS.  - Echo 4/19: EF 25% RV moderate to severely decreased - on NE 6. Co-ox 71%  - C/w refractory volume overload despite attempts at diuresis. Now w/ azotemia. Nephrology consulted for CRRT - Continue NE support to allow for UF   - continue TEDs  - GDMT limited by hypotension and renal fx    4. Post-op hypoxic resp failure - extubated 4/18.  - re-intubated  4/19 - diffuse mucopurulent secretions on bronch 4/19. BAL GS: Mod WBC. Few gram variable rods. Rare yeast - Spiked a fever on 04/23 + worsening leukocytosis w/ WBC up to 30 K  - CT C/A/P 4/27 w/ small postoperative seroma around aortic root/graft and possible PNA.  - Started back on  Vanc + Meropenum and Micafungin  added 4/28 per CCM  - RespCx 4/28 + Candida. No Bacteria. Continue Micafungin  per CCM. Vanc/Mero now discontinued  - start CRRT today to help w/ volume removal  - vent management per CCM. Plan trach tomorrow   5. PAF with post-termination pauses PVCs - Patient with AF intra-op.  - continues in and out of afib, currently back in NSR - continue IV amio while on pressor support  - continue heparin  gtt    6. Hypokalemia/hypernatremia - Suspect diuretic induced (K 3.3, Na 152) - give K supp  - continue FW boluses - starting CRRT today   7. AKI due to ATN/shock - Baseline SCr ~1.3-1.4 - SCr peaked at 3.2, Scr 1.8 today. BUN 114 - Nephrology consulted for azotemia and refractory volume overload   8. ABLA post-op - Received 6uPRBCs, 6 plt, 4 FFP and 5 cryo intra-op.  - Hgb 7.1 today   - Transfuse Hgb < 7.0   9. Acute Venous Thrombosis  - +LLE DVT (gastroc vein) - + LUE superficial cephalic venin thrombosis - heparin  gtt until completion of invasive procedures (anticipate likely trach later in the week)   10. Encephalopathy  - slow to wake up despite being off sedation  - plan head CT  today  - ? 2/2 uremia. Start CRRT today   CRITICAL CARE Performed by: Ruddy Corral   Total critical care time: 15 minutes  Critical care time was exclusive of separately billable procedures and treating other patients.  Critical care was necessary to treat or prevent imminent or life-threatening deterioration.  Critical care was time spent personally by me on the following activities: development of treatment plan with patient and/or surrogate as well as nursing, discussions with consultants, evaluation of patient's response to treatment, examination of patient, obtaining history from patient or surrogate, ordering and performing treatments and interventions, ordering and review of laboratory studies, ordering and review of radiographic studies, pulse oximetry and re-evaluation of patient's condition.     Length of Stay: [address], PA-C  02/17/2024, 9:07 AM  Advanced Heart Failure Team Pager [phone] (M-F; 7a - 5p)  Please contact [facility] for night-coverage after hours (5p -7a ) and weekends on [email or web address]

## 2024-02-17 DEATH — deceased

## 2024-02-18 ENCOUNTER — Inpatient Hospital Stay (HOSPITAL_COMMUNITY)

## 2024-02-18 DIAGNOSIS — I7101 Dissection of ascending aorta: Secondary | ICD-10-CM | POA: Diagnosis not present

## 2024-02-18 DIAGNOSIS — I5021 Acute systolic (congestive) heart failure: Secondary | ICD-10-CM | POA: Diagnosis not present

## 2024-02-18 DIAGNOSIS — J9601 Acute respiratory failure with hypoxia: Secondary | ICD-10-CM | POA: Diagnosis not present

## 2024-02-18 DIAGNOSIS — I2111 ST elevation (STEMI) myocardial infarction involving right coronary artery: Secondary | ICD-10-CM | POA: Diagnosis not present

## 2024-02-18 DIAGNOSIS — I480 Paroxysmal atrial fibrillation: Secondary | ICD-10-CM | POA: Diagnosis not present

## 2024-02-18 DIAGNOSIS — E876 Hypokalemia: Secondary | ICD-10-CM | POA: Diagnosis not present

## 2024-02-18 LAB — RENAL FUNCTION PANEL
Albumin: 2 g/dL — ABNORMAL LOW (ref 3.5–5.0)
Albumin: 2 g/dL — ABNORMAL LOW (ref 3.5–5.0)
Anion gap: 10 (ref 5–15)
Anion gap: 13 (ref 5–15)
BUN: 67 mg/dL — ABNORMAL HIGH (ref 8–23)
BUN: 58 mg/dL — ABNORMAL HIGH (ref 8–23)
CO2: 27 mmol/L (ref 22–32)
CO2: 23 mmol/L (ref 22–32)
Calcium: 8.4 mg/dL — ABNORMAL LOW (ref 8.9–10.3)
Calcium: 8.2 mg/dL — ABNORMAL LOW (ref 8.9–10.3)
Chloride: 110 mmol/L (ref 98–111)
Chloride: 106 mmol/L (ref 98–111)
Creatinine, Ser: 1.3 mg/dL — ABNORMAL HIGH (ref 0.61–1.24)
Creatinine, Ser: 1.27 mg/dL — ABNORMAL HIGH (ref 0.61–1.24)
GFR, Estimated: 60 mL/min — ABNORMAL LOW (ref >60)
GFR, Estimated: >60 mL/min (ref >60)
Glucose, Bld: 180 mg/dL — ABNORMAL HIGH (ref 70–99)
Glucose, Bld: 300 mg/dL — ABNORMAL HIGH (ref 70–99)
Phosphorus: 3.9 mg/dL (ref 2.5–4.6)
Phosphorus: 3.8 mg/dL (ref 2.5–4.6)
Potassium: 3.7 mmol/L (ref 3.5–5.1)
Potassium: 3.7 mmol/L (ref 3.5–5.1)
Sodium: 147 mmol/L — ABNORMAL HIGH (ref 135–145)
Sodium: 142 mmol/L (ref 135–145)

## 2024-02-18 LAB — GLUCOSE, CAPILLARY
Glucose-Capillary: 167 mg/dL — ABNORMAL HIGH (ref 70–99)
Glucose-Capillary: 168 mg/dL — ABNORMAL HIGH (ref 70–99)
Glucose-Capillary: 122 mg/dL — ABNORMAL HIGH (ref 70–99)
Glucose-Capillary: 175 mg/dL — ABNORMAL HIGH (ref 70–99)
Glucose-Capillary: 154 mg/dL — ABNORMAL HIGH (ref 70–99)
Glucose-Capillary: 158 mg/dL — ABNORMAL HIGH (ref 70–99)

## 2024-02-18 LAB — CBC
HCT: 24.6 % — ABNORMAL LOW (ref 39.0–52.0)
Hemoglobin: 7.3 g/dL — ABNORMAL LOW (ref 13.0–17.0)
MCH: 27.2 pg (ref 26.0–34.0)
MCHC: 29.7 g/dL — ABNORMAL LOW (ref 30.0–36.0)
MCV: 91.8 fL (ref 80.0–100.0)
Platelets: 756 10*3/uL — ABNORMAL HIGH (ref 150–400)
RBC: 2.68 MIL/uL — ABNORMAL LOW (ref 4.22–5.81)
RDW: 20.5 % — ABNORMAL HIGH (ref 11.5–15.5)
WBC: 18.2 10*3/uL — ABNORMAL HIGH (ref 4.0–10.5)
nRBC: 36.7 % — ABNORMAL HIGH (ref 0.0–0.2)

## 2024-02-18 LAB — COOXEMETRY PANEL
Carboxyhemoglobin: 1.5 % (ref 0.5–1.5)
Methemoglobin: <0.7 % (ref 0.0–1.5)
O2 Saturation: 70.7 %
Total hemoglobin: 8 g/dL — ABNORMAL LOW (ref 12.0–16.0)

## 2024-02-18 LAB — HEPARIN LEVEL (UNFRACTIONATED): Heparin Unfractionated: 0.21 [IU]/mL — ABNORMAL LOW (ref 0.30–0.70)

## 2024-02-18 LAB — TRIGLYCERIDES: Triglycerides: 84 mg/dL (ref <150)

## 2024-02-18 LAB — MAGNESIUM: Magnesium: 2.9 mg/dL — ABNORMAL HIGH (ref 1.7–2.4)

## 2024-02-18 MED ORDER — ETOMIDATE 2 MG/ML IV SOLN
20.0000 mg | Freq: Once | INTRAVENOUS | Status: AC
  Administered 2024-02-18: 20 mg via INTRAVENOUS
  Filled 2024-02-18: qty 10

## 2024-02-18 MED ORDER — KETAMINE HCL 50 MG/5ML IJ SOSY
PREFILLED_SYRINGE | INTRAMUSCULAR | Status: AC
  Filled 2024-02-18: qty 10

## 2024-02-18 MED ORDER — ROCURONIUM BROMIDE 10 MG/ML (PF) SYRINGE
100.0000 mg | PREFILLED_SYRINGE | Freq: Once | INTRAVENOUS | Status: AC
  Administered 2024-02-18: 100 mg via INTRAVENOUS

## 2024-02-18 MED ORDER — FREE WATER
200.0000 mL | Freq: Two times a day (BID) | Status: DC
  Administered 2024-02-18: 200 mL

## 2024-02-18 MED ORDER — HEPARIN (PORCINE) 25000 UT/250ML-% IV SOLN
1700.0000 [IU]/h | INTRAVENOUS | Status: DC
  Administered 2024-02-18: 1600 [IU]/h via INTRAVENOUS
  Administered 2024-02-19: 1900 [IU]/h via INTRAVENOUS
  Administered 2024-02-19: 1700 [IU]/h via INTRAVENOUS
  Administered 2024-02-20: 1900 [IU]/h via INTRAVENOUS
  Administered 2024-02-21: 2000 [IU]/h via INTRAVENOUS
  Administered 2024-02-21: 1900 [IU]/h via INTRAVENOUS
  Administered 2024-02-22 – 2024-02-26 (×9): 2000 [IU]/h via INTRAVENOUS
  Administered 2024-02-27: 1800 [IU]/h via INTRAVENOUS
  Filled 2024-02-18 (×15): qty 250

## 2024-02-18 MED ORDER — INSULIN GLARGINE-YFGN 100 UNIT/ML ~~LOC~~ SOLN
12.0000 [IU] | Freq: Two times a day (BID) | SUBCUTANEOUS | Status: DC
  Administered 2024-02-18 – 2024-02-19 (×3): 12 [IU] via SUBCUTANEOUS
  Filled 2024-02-18 (×5): qty 0.12

## 2024-02-18 MED ORDER — MIDAZOLAM HCL 2 MG/2ML IJ SOLN
INTRAMUSCULAR | Status: AC
  Filled 2024-02-18: qty 2

## 2024-02-18 MED ORDER — LIDOCAINE-EPINEPHRINE 1 %-1:100000 IJ SOLN
20.0000 mL | Freq: Once | INTRAMUSCULAR | Status: AC
  Administered 2024-02-18: 20 mL via INTRADERMAL

## 2024-02-18 MED ORDER — FENTANYL CITRATE PF 50 MCG/ML IJ SOSY
200.0000 ug | PREFILLED_SYRINGE | Freq: Once | INTRAMUSCULAR | Status: AC
  Administered 2024-02-18: 100 ug via INTRAVENOUS
  Filled 2024-02-18: qty 4

## 2024-02-18 MED ORDER — ROCURONIUM BROMIDE 10 MG/ML (PF) SYRINGE
PREFILLED_SYRINGE | INTRAVENOUS | Status: AC
  Filled 2024-02-18: qty 10

## 2024-02-18 MED ORDER — RENA-VITE PO TABS
1.0000 | ORAL_TABLET | Freq: Every day | ORAL | Status: DC
  Administered 2024-02-18 – 2024-02-25 (×8): 1 via ORAL
  Filled 2024-02-18 (×8): qty 1

## 2024-02-18 MED ORDER — ETOMIDATE 2 MG/ML IV SOLN
INTRAVENOUS | Status: AC
  Filled 2024-02-18: qty 20

## 2024-02-18 MED ORDER — FENTANYL CITRATE PF 50 MCG/ML IJ SOSY
PREFILLED_SYRINGE | INTRAMUSCULAR | Status: AC
  Filled 2024-02-18: qty 2

## 2024-02-18 MED ORDER — TRANEXAMIC ACID FOR INHALATION
500.0000 mg | Freq: Three times a day (TID) | RESPIRATORY_TRACT | Status: AC
  Administered 2024-02-18 – 2024-02-19 (×4): 500 mg via RESPIRATORY_TRACT
  Filled 2024-02-18 (×6): qty 10

## 2024-02-18 MED ORDER — MIDAZOLAM HCL 2 MG/2ML IJ SOLN
5.0000 mg | Freq: Once | INTRAMUSCULAR | Status: AC
  Administered 2024-02-18: 2.5 mg via INTRAVENOUS
  Filled 2024-02-18: qty 6

## 2024-02-18 MED ORDER — SUCCINYLCHOLINE CHLORIDE 200 MG/10ML IV SOSY
PREFILLED_SYRINGE | INTRAVENOUS | Status: AC
  Filled 2024-02-18: qty 10

## 2024-02-18 NOTE — Progress Notes (Signed)
 Advanced Heart Failure Rounding Note  Cardiologist: None   Chief Complaint: STEMI> Cardiogenic shock> Now s/p aortic dissection repair + Bentall  Patient Profile   Dustin Davenport is a 68 y.o. male with HTN and HLD. Admitted with STEMI now with CGS. Found to have AAA and taken emergently to the OR. Now s/p Bentall/AVR and reimplantation of coronaries.  Significant events:    - Echo 4/15: AAA measuring 5-5.7 cm. Followed by emergent TEE 4/15 which was significant for torrential aortic regurgitation and dissection flap extending from the aortic root to the proximal ascending aorta  - CT C/A/P: Showed acute type A dissection with a 5.5 cm ascending aortic aneurysm extending into arch. Dissection goes down to infrarenal aorta and extends up into innominate and left common carotid artery - 4/16 Underwent aortic dissection repair (Bentall/AVR and reimplantation of coronaries. Intra-op course c/b coagulopathy due to Effient  (PCI of RCA earlier in the day)).  - Extubated 4/18 - Re-intubated 4/19. Bronch with diffuse mucopurulent secretions - 4/26 Afib with post conversion pause. Amiodarone  decreased.  - 4/28 Started back on  Vanc + Meropenum and Micafungin  added 4/28 per CCM. Resp Cx + Candida   - 4/28 LLE Venous Doppler +DVT (gastroc vein), LUE Venous Doppler + for superficial cephalic vein thrombosis  -5/1 CT head. No acute findings. Started on CRRT.   Subjective:   On Norepi 5 mcg. CO-OX 71%.   Remains intubated. On CRRT.  Objective:    Weight Range: 128.1 kg Body mass index is 37.26 kg/m.   Vital Signs:   Temp:  [98.8 F (37.1 C)-99.9 F (37.7 C)] 99.2 F (37.3 C) (05/02 0350) Pulse Rate:  [53-137] 86 (05/02 0800) Resp:  [12-40] 22 (05/02 0800) BP: (53-143)/(28-117) 88/41 (05/02 0800) SpO2:  [87 %-100 %] 100 % (05/02 0800) FiO2 (%):  [40 %] 40 % (05/02 0701) Weight:  [128.1 kg] 128.1 kg (05/02 0255) Last BM Date : 02/16/24  Weight change: Filed Weights   02/16/24 0500  02/17/24 0338 02/18/24 0255  Weight: 128.3 kg 128 kg 128.1 kg   Intake/Output:  Intake/Output Summary (Last 24 hours) at 02/18/2024 0838 Last data filed at 02/18/2024 0800 Gross per 24 hour  Intake 2555.47 ml  Output 5882.5 ml  Net -3327.03 ml   CVP 10-12  Physical Exam  General:  Intubated.  On CRRT  Neck: supple. JVP difficult to assess due to body habitus.  Cor: PMI nondisplaced. Irregular rate & rhythm. No rubs, gallops or murmurs. Lungs: clear Abdomen: soft, nontender, nondistended.  Extremities: no cyanosis, clubbing, rash, R and LLE 2+ edema extending to thighs. R and LUE edematous.  Neuro: Intubated. Opens eyes. Intermittently following commands.    Telemetry   In and out of A fib.   Labs    CBC Recent Labs    02/17/24 0503 02/18/24 0400  WBC 20.4* 18.2*  HGB 7.1* 7.3*  HCT 23.3* 24.6*  MCV 90.3 91.8  PLT 783* 756*   Basic Metabolic Panel Recent Labs    64/40/34 0505 02/17/24 0503 02/17/24 1643 02/18/24 0400  NA 152*   < > 151* 147*  K 3.6   < > 3.5 3.7  CL 113*   < > 116* 110  CO2 28   < > 30 27  GLUCOSE 201*   < > 202* 180*  BUN 109*   < > 95* 67*  CREATININE 1.40*   < > 1.53* 1.30*  CALCIUM  8.5*   < > 8.4* 8.4*  MG  2.8*  --   --  2.9*  PHOS 4.8*   < > 4.1 3.9   < > = values in this interval not displayed.   Liver Function Tests Recent Labs    02/17/24 1643 02/18/24 0400  ALBUMIN  1.9* 2.0*   D-Dimer No results for input(s): "DDIMER" in the last 72 hours.  Hemoglobin A1C No results for input(s): "HGBA1C" in the last 72 hours.  Medications:    Scheduled Medications:  Chlorhexidine  Gluconate Cloth  6 each Topical Daily   clopidogrel   75 mg Oral Daily   feeding supplement (PROSource TF20)  60 mL Per Tube QID   free water   200 mL Per Tube Q12H   Gerhardt's butt cream   Topical Daily   insulin  aspart  0-20 Units Subcutaneous Q4H   insulin  aspart  5 Units Subcutaneous Q4H   insulin  glargine-yfgn  12 Units Subcutaneous BID   mouth rinse   15 mL Mouth Rinse Q2H   pantoprazole  (PROTONIX ) IV  40 mg Intravenous QHS   polyethylene glycol  17 g Oral Daily   rosuvastatin   40 mg Oral Daily   sodium chloride  flush  10-40 mL Intracatheter Q12H   Infusions:  amiodarone  30 mg/hr (02/18/24 0800)   desmopressin  (DDAVP ) 20 mcg in sodium chloride  0.9 % 50 mL IVPB     feeding supplement (VITAL 1.5 CAL) 55 mL/hr at 02/18/24 0800   heparin  Stopped (02/18/24 0704)   micafungin  (MYCAMINE ) 150 mg in sodium chloride  0.9 % 100 mL IVPB Stopped (02/17/24 1219)   norepinephrine  (LEVOPHED ) Adult infusion 5 mcg/min (02/18/24 0800)   prismasol  BGK 4/2.5 400 mL/hr at 02/17/24 1556   prismasol  BGK 4/2.5 400 mL/hr at 02/17/24 1556   prismasol  BGK 4/2.5 1,500 mL/hr at 02/18/24 1610   propofol  (DIPRIVAN ) infusion Stopped (02/15/24 0754)   PRN Medications: acetaminophen  (TYLENOL ) oral liquid 160 mg/5 mL, albuterol , fentaNYL  (SUBLIMAZE ) injection, heparin , midazolam , ondansetron  (ZOFRAN ) IV, mouth rinse, polyvinyl alcohol , sodium chloride , sodium chloride  flush   Assessment/Plan   Acute type A aortic dissection  - Echo 4/15: AAA measuring 5-5.7 cm - Emergent TEE 4/15: significant for torrential aortic regurgitation and dissection flap extending from the aortic root to the proximal ascending aorta  - CT C/A/P: Showed acute type A dissection with a 5.5 cm ascending aortic aneurysm extending into arch. Dissection goes down to infrarenal aorta and extends up into innominate and left common carotid arteries  - Now s/p aortic dissection repair 02/01/24 (Bentall/AVR and reimplantation of coronaries.  2. CAD, Acute inferior STEMI - Admitted with STEMI. Post PCI found to also have aortic dissection - LHC with single vessel CAD involving the mid to distal RCA, successful PCI of the RCA with overlapping DES x 3. - No aspirin  with heparin  gtt - Continue plavix  75mg  daily - Continue Crestor  40 mg daily  3. Acute systolic HF ->  Cardiogenic shock - Post perfusion  CGS.  - Echo 4/19: EF 25% RV moderate to severely decreased - on NE 5 . Co-ox 71%  - Started on CRRT . CVP 10-12.  - Continue NE support to allow for UF   - continue TEDs  - GDMT limited by hypotension and renal fx   4. Post-op hypoxic resp failure - extubated 4/18.  - re-intubated 4/19 - diffuse mucopurulent secretions on bronch 4/19. BAL GS: Mod WBC. Few gram variable rods. Rare yeast - Spiked a fever on 04/23 + worsening leukocytosis w/ WBC up to 30 K  - CT C/A/P 4/27 w/ small  postoperative seroma around aortic root/graft and possible PNA.  - Started back on  Vanc + Meropenum and Micafungin  added 4/28 per CCM  - RespCx 4/28 + Candida. No Bacteria. Continue Micafungin  per CCM. Vanc/Mero now discontinued  WBC coming down.  - On CRRT volume management.  - vent management per CCM. Plan trach today   5. PAF with post-termination pauses PVCs - Patient with AF intra-op. In and out of A fib.  - continue IV amio while on pressor support  - continue heparin  gtt   6. Hypokalemia/hypernatremia - On CRRT - Getting free water .    7. AKI due to ATN/shock - Baseline SCr ~1.3-1.4 - On CRRT -Per Nephrology.   8. ABLA post-op - Received 6uPRBCs, 6 plt, 4 FFP and 5 cryo intra-op.  - Hgb 7.3  today   - Transfuse Hgb < 7.0   9. Acute Venous Thrombosis  - +LLE DVT (gastroc vein) - + LUE superficial cephalic venin thrombosis - heparin  gtt until completion of invasive procedures (anticipate likely trach later in the week)   10. Encephalopathy  - slow to wake up despite being off sedation  - CT head no acute findings.     CRITICAL CARE Performed by: Nieves Bars NP-C    Total critical care time: 10 minutes  Critical care time was exclusive of separately billable procedures and treating other patients.  Critical care was necessary to treat or prevent imminent or life-threatening deterioration.  Critical care was time spent personally by me on the following activities: development of  treatment plan with patient and/or surrogate as well as nursing, discussions with consultants, evaluation of patient's response to treatment, examination of patient, obtaining history from patient or surrogate, ordering and performing treatments and interventions, ordering and review of laboratory studies, ordering and review of radiographic studies, pulse oximetry and re-evaluation of patient's condition.     Length of Stay: 17  Nieves Bars, NP  02/18/2024, 8:38 AM  Advanced Heart Failure Team Pager [phone] (M-F; 7a - 5p)  Please contact [facility] for night-coverage after hours (5p -7a ) and weekends on [email or web address]

## 2024-02-18 NOTE — Procedures (Signed)
 Percutaneous Tracheostomy Procedure Note   Deronte R Sahni  [id number]  1956/05/14  Date:02/18/24  Time:11:54 AM   Provider Performing:Cassondra Stachowski C Felipe Horton  Procedure: Percutaneous Tracheostomy with Bronchoscopic Guidance (56213)  Indication(s) Persistent resp failure.  Consent Risks of the procedure as well as the alternatives and risks of each were explained to the patient and/or caregiver.  Consent for the procedure was obtained.  Anesthesia Etomidate , Versed , Fentanyl , Vecuronium    Time Out Verified patient identification, verified procedure, site/side was marked, verified correct patient position, special equipment/implants available, medications/allergies/relevant history reviewed, required imaging and test results available.   Sterile Technique Maximal sterile technique including sterile barrier drape, hand hygiene, sterile gown, sterile gloves, mask, hair covering.    Procedure Description Appropriate anatomy identified by palpation.  Patient's neck prepped and draped in sterile fashion.  1% lidocaine  with epinephrine  was used to anesthetize skin overlying neck.  1.5cm incision made and blunt dissection performed until tracheal rings could be easily palpated.   Then a size 8=0 Shiley tracheostomy was placed under bronchoscopic visualization using usual Seldinger technique and serial dilation.   Bronchoscope confirmed placement above the carina.  Tracheostomy was sutured in place with adhesive pad to protect skin under pressure.    Patient connected to ventilator.   Complications/Tolerance Tolerated okay, after we did a therapeutic suctioning of mucus plugging, he does have some active oozing in RLL that I squirted some epi on and seemed to ease it up.  Hold heparin  until at least 6 to assure this does not progress. Chest X-ray is ordered to confirm no post-procedural complication.   EBL Minimal   Specimen(s) None

## 2024-02-18 NOTE — Progress Notes (Signed)
 eLink Physician-Brief Progress Note [name] DOB: 1956-05-02 MRN: [id number]   Date of Service  02/18/2024  HPI/Events of Note  Type A dissection, RCA infarct Post DES then repair + AVR  Patient has thrombocytosis and anemia on a heparin  infusion.  Now having liquid stools after having an ileus with severe constipation  eICU Interventions  Discontinue scheduled Reglan   Add Flexi-Seal     Intervention Category Minor Interventions: Routine modifications to care plan (e.g. PRN medications for pain, fever)  Addis Tuohy 02/18/2024, 12:29 AM

## 2024-02-18 NOTE — Progress Notes (Signed)
 Dustin Davenport is an 68 y.o. male  HTN HLD p/w acute chest pain -> acute inferior STEMI emergently to the Cath Lab showing diffuse RCA disease treated with DES x 3.  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  Taken emergently to the OR with aortic dissection repair, AVR and reimplantation of the coronaries on February 02, 2024. EF was down to 20 to 25% with moderate to severely decreased RV as well.  BL creatinine was 1.3-1.4 but peaked at 3.2.   Assessment/Plan: Acute renal failure secondary to ischemic ATN with refractory hypernatremia despite very aggressive free water  replacement every 2 hours as well as severely volume overloaded.  Patient is net +12 L during this hospitalization by what is recorded but on exam possibly even more - Initiating CRRT for renal failure, refractory hyponatremia and volume overload; CRRT as low efficiency and the hyponatremia should slowly correct with low risk of cerebral edema will monitor rate of correction closely.  Spouse was bedside, options discussed and she agrees to proceed with CRRT - Appreciate dialysis catheter placement by CCM Seen on CRRT all 4K bath Filter clotted overnight, citrate shortage but can start heparin  after the trach CVP just checked and approximately 12-14; will increase UF rate from 100-200 to allow flexibility. +11.4L during this hospitalization and will titrate UF based on clinical response  -Monitor Daily I/Os, Daily weight    -Maintain MAP>65 for optimal renal perfusion.  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 Anemia status post multiple units of blood products including RBCs, platelets, FFP  and cryo. Acute systolic heart failure followed by advanced heart failure team treated with Lasix   Subjective: Following commands, on low-dose Levophed , filter clotted overnight going for trach today   Chemistry and CBC: Creatinine, Ser  Date/Time Value Ref Range Status  02/18/2024 04:00 AM 1.30 (H) 0.61 - 1.24 mg/dL Final  16/07/9603 54:09 PM 1.53 (H) 0.61 - 1.24 mg/dL Final  81/19/1478 29:56 AM 1.82 (H) 0.61 - 1.24 mg/dL Final  21/30/8657 84:69 AM 1.40 (H) 0.61 - 1.24 mg/dL Final  62/95/2841 32:44 PM 1.68 (H) 0.61 - 1.24 mg/dL Final  10/21/7251 66:44 AM 1.71 (H) 0.61 - 1.24 mg/dL Final  03/47/4259 56:38 AM 1.60 (H) 0.61 - 1.24 mg/dL Final  75/64/3329 51:88 AM 1.51 (H) 0.61 - 1.24 mg/dL Final  41/66/0630 16:01 AM 1.41 (H) 0.61 - 1.24 mg/dL Final  09/32/3557 32:20 AM 1.38 (H) 0.61 - 1.24 mg/dL Final  25/42/7062 37:62 PM 1.63 (H) 0.61 - 1.24 mg/dL Final  83/15/1761 60:73 AM 1.62 (H) 0.61 - 1.24 mg/dL Final  71/03/2693 85:46 AM 1.58 (H) 0.61 - 1.24 mg/dL Final  27/12/5007 38:18 PM 1.71 (H) 0.61 - 1.24 mg/dL Final  29/93/7169 67:89 AM 1.61 (H) 0.61 - 1.24 mg/dL Final  38/07/1750 02:58 AM 1.64 (H) 0.61 - 1.24 mg/dL Final  52/77/8242 35:36 PM 1.93 (H) 0.61 - 1.24 mg/dL Final  14/43/1540 08:67 AM 2.27 (H) 0.61 - 1.24 mg/dL Final  61/95/0932 67:12 AM 2.98 (H) 0.61 - 1.24 mg/dL Final  45/80/9983 38:25 AM 2.86 (H) 0.61 - 1.24 mg/dL Final  05/39/7673 41:93 AM 3.22 (H) 0.61 - 1.24 mg/dL Final  79/11/4095  12:32 AM 3.17 (H) 0.61 - 1.24 mg/dL Final  16/07/9603 54:09 PM 3.32 (H) 0.61 - 1.24 mg/dL Final  81/19/1478 29:56 AM 3.28 (H) 0.61 - 1.24 mg/dL Final  21/30/8657 84:69 AM 2.73 (H) 0.61 - 1.24 mg/dL Final  62/95/2841 32:44 PM 2.26 (H) 0.61 - 1.24 mg/dL Final  10/21/7251 66:44 PM 2.16 (H) 0.61 - 1.24 mg/dL Final  03/47/4259 56:38 AM 1.40 (H) 0.61 - 1.24 mg/dL Final  75/64/3329 51:88 AM 1.40 (H) 0.61 - 1.24 mg/dL Final  41/66/0630 16:01 AM 1.40 (H) 0.61 - 1.24 mg/dL Final  09/32/3557 32:20  AM 1.40 (H) 0.61 - 1.24 mg/dL Final  25/42/7062 37:62 AM 1.40 (H) 0.61 - 1.24 mg/dL Final  83/15/1761 60:73 AM 1.50 (H) 0.61 - 1.24 mg/dL Final  71/03/2693 85:46 PM 1.50 (H) 0.61 - 1.24 mg/dL Final  27/12/5007 38:18 PM 1.60 (H) 0.61 - 1.24 mg/dL Final  29/93/7169 67:89 PM 1.60 (H) 0.61 - 1.24 mg/dL Final  38/07/1750 02:58 PM 1.50 (H) 0.61 - 1.24 mg/dL Final  52/77/8242 35:36 PM 1.70 (H) 0.61 - 1.24 mg/dL Final  14/43/1540 08:67 PM 1.60 (H) 0.61 - 1.24 mg/dL Final  61/95/0932 67:12 AM 1.45 (H) 0.61 - 1.24 mg/dL Final  45/80/9983 38:25 AM 1.30 (H) 0.61 - 1.24 mg/dL Final  05/39/7673 41:93 AM 1.30 (H) 0.61 - 1.24 mg/dL Final  79/11/4095 35:32 AM 0.98 0.61 - 1.24 mg/dL Final   Recent Labs  Lab 02/13/24 0317 02/14/24 0358 02/15/24 0204 02/15/24 1400 02/16/24 0505 02/17/24 0503 02/17/24 1643 02/18/24 0400  NA 149* 149* 150* 154* 152* 152* 151* 147*  K 4.4 3.2* 3.0* 3.2* 3.6 3.3* 3.5 3.7  CL 117* 113* 110 115* 113* 113* 116* 110  CO2 23 24 25 28 28 29 30 27   GLUCOSE 266* 280* 255* 194* 201* 200* 202* 180*  BUN 80* 88* 96* 99* 109* 114* 95* 67*  CREATININE 1.51* 1.60* 1.71* 1.68* 1.40* 1.82* 1.53* 1.30*  CALCIUM  8.1* 8.1* 8.3* 8.6* 8.5* 8.5* 8.4* 8.4*  PHOS 4.2 3.6 3.8  --  4.8* 5.1* 4.1 3.9   Recent Labs  Lab 02/15/24 0204 02/16/24 1052 02/17/24 0503 02/18/24 0400  WBC 30.2* 23.8* 20.4* 18.2*  HGB 7.4* 7.0* 7.1* 7.3*  HCT 23.9* 23.2* 23.3* 24.6*  MCV 90.9 91.3 90.3 91.8  PLT 708* 740* 783* 756*   Liver Function Tests: Recent Labs  Lab 02/17/24 0503 02/17/24 1643 02/18/24 0400  ALBUMIN  1.8* 1.9* 2.0*   No results for input(s): "LIPASE", "AMYLASE" in the last 168 hours. No results for input(s): "AMMONIA" in the last 168 hours. Cardiac Enzymes: No results for input(s): "CKTOTAL", "CKMB", "CKMBINDEX", "TROPONINI" in the last 168 hours. Iron Studies: No results for input(s): "IRON", "TIBC", "TRANSFERRIN", "FERRITIN" in the last 72  hours. PT/INR: @LABRCNTIP (inr:5)  Xrays/Other Studies: ) Results for orders placed or performed during the hospital encounter of 02/01/24 (from the past 48 hours)  Glucose, capillary     Status: Abnormal   Collection Time: 02/16/24  9:01 AM  Result Value Ref Range   Glucose-Capillary 132 (H) 70 - 99 mg/dL    Comment: Glucose reference range applies only to samples taken after fasting for at least 8 hours.  Glucose, capillary     Status: Abnormal   Collection Time: 02/16/24 10:31 AM  Result Value Ref Range   Glucose-Capillary 147 (H) 70 - 99 mg/dL    Comment: Glucose reference range applies only to samples taken after fasting for at least 8 hours.  CBC  Status: Abnormal   Collection Time: 02/16/24 10:52 AM  Result Value Ref Range   WBC 23.8 (H) 4.0 - 10.5 K/uL   RBC 2.54 (L) 4.22 - 5.81 MIL/uL   Hemoglobin 7.0 (L) 13.0 - 17.0 g/dL   HCT 54.0 (L) 98.1 - 19.1 %   MCV 91.3 80.0 - 100.0 fL   MCH 27.6 26.0 - 34.0 pg   MCHC 30.2 30.0 - 36.0 g/dL   RDW 47.8 (H) 29.5 - 62.1 %   Platelets 740 (H) 150 - 400 K/uL   nRBC 25.7 (H) 0.0 - 0.2 %    Comment: Performed at [facility], [address], [place], [place] [address]  Glucose, capillary     Status: Abnormal   Collection Time: 02/16/24 11:53 AM  Result Value Ref Range   Glucose-Capillary 140 (H) 70 - 99 mg/dL    Comment: Glucose reference range applies only to samples taken after fasting for at least 8 hours.  Glucose, capillary     Status: Abnormal   Collection Time: 02/16/24  4:05 PM  Result Value Ref Range   Glucose-Capillary 126 (H) 70 - 99 mg/dL    Comment: Glucose reference range applies only to samples taken after fasting for at least 8 hours.  Glucose, capillary     Status: Abnormal   Collection Time: 02/16/24  7:40 PM  Result Value Ref Range   Glucose-Capillary 155 (H) 70 - 99 mg/dL    Comment: Glucose reference range applies only to samples taken after fasting for at least 8 hours.  Glucose, capillary      Status: Abnormal   Collection Time: 02/16/24 11:37 PM  Result Value Ref Range   Glucose-Capillary 145 (H) 70 - 99 mg/dL    Comment: Glucose reference range applies only to samples taken after fasting for at least 8 hours.  Glucose, capillary     Status: Abnormal   Collection Time: 02/17/24  3:06 AM  Result Value Ref Range   Glucose-Capillary 137 (H) 70 - 99 mg/dL    Comment: Glucose reference range applies only to samples taken after fasting for at least 8 hours.  Cooxemetry Panel (carboxy, met, total hgb, O2 sat)     Status: Abnormal   Collection Time: 02/17/24  5:03 AM  Result Value Ref Range   Total hemoglobin 7.4 (L) 12.0 - 16.0 g/dL   O2 Saturation 71 %   Carboxyhemoglobin 1.5 0.5 - 1.5 %   Methemoglobin <0.7 0.0 - 1.5 %    Comment: Performed at [facility], [address], [place], [place] [address]  Renal function panel     Status: Abnormal   Collection Time: 02/17/24  5:03 AM  Result Value Ref Range   Sodium 152 (H) 135 - 145 mmol/L   Potassium 3.3 (L) 3.5 - 5.1 mmol/L   Chloride 113 (H) 98 - 111 mmol/L   CO2 29 22 - 32 mmol/L   Glucose, Bld 200 (H) 70 - 99 mg/dL    Comment: Glucose reference range applies only to samples taken after fasting for at least 8 hours.   BUN 114 (H) 8 - 23 mg/dL   Creatinine, Ser 6.29 (H) 0.61 - 1.24 mg/dL   Calcium  8.5 (L) 8.9 - 10.3 mg/dL   Phosphorus 5.1 (H) 2.5 - 4.6 mg/dL   Albumin  1.8 (L) 3.5 - 5.0 g/dL   GFR, Estimated 40 (L) >60 mL/min    Comment: (NOTE) Calculated using the CKD-EPI Creatinine Equation (2021)  Anion gap 10 5 - 15    Comment: Performed at [facility], [address], [place], [place] [address]  Heparin  level (unfractionated)     Status: None   Collection Time: 02/17/24  5:03 AM  Result Value Ref Range   Heparin  Unfractionated 0.38 0.30 - 0.70 IU/mL    Comment: (NOTE) The clinical reportable range upper limit is being lowered to >1.10 to align with the FDA approved guidance for the current  laboratory assay.  If heparin  results are below expected values, and patient dosage has  been confirmed, suggest follow up testing of antithrombin III levels. Performed at [facility], [address], [place], [place] [address]   CBC     Status: Abnormal   Collection Time: 02/17/24  5:03 AM  Result Value Ref Range   WBC 20.4 (H) 4.0 - 10.5 K/uL   RBC 2.58 (L) 4.22 - 5.81 MIL/uL   Hemoglobin 7.1 (L) 13.0 - 17.0 g/dL   HCT 09.8 (L) 11.9 - 14.7 %   MCV 90.3 80.0 - 100.0 fL   MCH 27.5 26.0 - 34.0 pg   MCHC 30.5 30.0 - 36.0 g/dL   RDW 82.9 (H) 56.2 - 13.0 %   Platelets 783 (H) 150 - 400 K/uL   nRBC 33.8 (H) 0.0 - 0.2 %    Comment: REPEATED TO VERIFY Performed at [facility], [address], [place], [place] [address]   Glucose, capillary     Status: Abnormal   Collection Time: 02/17/24  7:39 AM  Result Value Ref Range   Glucose-Capillary 138 (H) 70 - 99 mg/dL    Comment: Glucose reference range applies only to samples taken after fasting for at least 8 hours.  Glucose, capillary     Status: Abnormal   Collection Time: 02/17/24 11:16 AM  Result Value Ref Range   Glucose-Capillary 126 (H) 70 - 99 mg/dL    Comment: Glucose reference range applies only to samples taken after fasting for at least 8 hours.  Glucose, capillary     Status: Abnormal   Collection Time: 02/17/24  3:36 PM  Result Value Ref Range   Glucose-Capillary 128 (H) 70 - 99 mg/dL    Comment: Glucose reference range applies only to samples taken after fasting for at least 8 hours.  Renal function panel (daily at 1600)     Status: Abnormal   Collection Time: 02/17/24  4:43 PM  Result Value Ref Range   Sodium 151 (H) 135 - 145 mmol/L   Potassium 3.5 3.5 - 5.1 mmol/L   Chloride 116 (H) 98 - 111 mmol/L   CO2 30 22 - 32 mmol/L   Glucose, Bld 202 (H) 70 - 99 mg/dL    Comment: Glucose reference range applies only to samples taken after fasting for at least 8 hours.   BUN 95 (H) 8 - 23 mg/dL   Creatinine,  Ser 4.69 (H) 0.61 - 1.24 mg/dL   Calcium  8.4 (L) 8.9 - 10.3 mg/dL   Phosphorus 4.1 2.5 - 4.6 mg/dL   Albumin  1.9 (L) 3.5 - 5.0 g/dL   GFR, Estimated 49 (L) >60 mL/min    Comment: (NOTE) Calculated using the CKD-EPI Creatinine Equation (2021)    Anion gap 5 5 - 15    Comment: Performed at [facility], [address], [place], [place] [address]  Glucose, capillary     Status: Abnormal   Collection Time: 02/17/24  8:01 PM  Result Value  Ref Range   Glucose-Capillary 140 (H) 70 - 99 mg/dL    Comment: Glucose reference range applies only to samples taken after fasting for at least 8 hours.  Glucose, capillary     Status: Abnormal   Collection Time: 02/17/24 11:49 PM  Result Value Ref Range   Glucose-Capillary 164 (H) 70 - 99 mg/dL    Comment: Glucose reference range applies only to samples taken after fasting for at least 8 hours.  Glucose, capillary     Status: Abnormal   Collection Time: 02/18/24  3:55 AM  Result Value Ref Range   Glucose-Capillary 167 (H) 70 - 99 mg/dL    Comment: Glucose reference range applies only to samples taken after fasting for at least 8 hours.  Renal function panel     Status: Abnormal   Collection Time: 02/18/24  4:00 AM  Result Value Ref Range   Sodium 147 (H) 135 - 145 mmol/L   Potassium 3.7 3.5 - 5.1 mmol/L   Chloride 110 98 - 111 mmol/L   CO2 27 22 - 32 mmol/L   Glucose, Bld 180 (H) 70 - 99 mg/dL    Comment: Glucose reference range applies only to samples taken after fasting for at least 8 hours.   BUN 67 (H) 8 - 23 mg/dL   Creatinine, Ser 1.61 (H) 0.61 - 1.24 mg/dL   Calcium  8.4 (L) 8.9 - 10.3 mg/dL   Phosphorus 3.9 2.5 - 4.6 mg/dL   Albumin  2.0 (L) 3.5 - 5.0 g/dL   GFR, Estimated 60 (L) >60 mL/min    Comment: (NOTE) Calculated using the CKD-EPI Creatinine Equation (2021)    Anion gap 10 5 - 15    Comment: Performed at [facility], [address], [place], [place] [address]  Triglycerides     Status: None   Collection Time:  02/18/24  4:00 AM  Result Value Ref Range   Triglycerides 84 <150 mg/dL    Comment: Performed at [facility], [address] [place], [place] [address]  Heparin  level (unfractionated)     Status: Abnormal   Collection Time: 02/18/24  4:00 AM  Result Value Ref Range   Heparin  Unfractionated 0.21 (L) 0.30 - 0.70 IU/mL    Comment: (NOTE) The clinical reportable range upper limit is being lowered to >1.10 to align with the FDA approved guidance for the current laboratory assay.  If heparin  results are below expected values, and patient dosage has  been confirmed, suggest follow up testing of antithrombin III levels. Performed at [facility], [address], [place], [place] [address]   CBC     Status: Abnormal   Collection Time: 02/18/24  4:00 AM  Result Value Ref Range   WBC 18.2 (H) 4.0 - 10.5 K/uL   RBC 2.68 (L) 4.22 - 5.81 MIL/uL   Hemoglobin 7.3 (L) 13.0 - 17.0 g/dL   HCT 78.2 (L) 95.6 - 21.3 %   MCV 91.8 80.0 - 100.0 fL   MCH 27.2 26.0 - 34.0 pg   MCHC 29.7 (L) 30.0 - 36.0 g/dL   RDW 08.6 (H) 57.8 - 46.9 %   Platelets 756 (H) 150 - 400 K/uL   nRBC 36.7 (H) 0.0 - 0.2 %    Comment: Performed at [facility], [address] [place], [place] [address]  Magnesium      Status: Abnormal   Collection Time: 02/18/24  4:00 AM  Result Value Ref Range   Magnesium  2.9 (H)  1.7 - 2.4 mg/dL    Comment: Performed at [facility], [address] [place] [address], [place], [place] [address]  Cooxemetry Panel (carboxy, met, total hgb, O2 sat)     Status: Abnormal   Collection Time: 02/18/24  6:00 AM  Result Value Ref Range   Total hemoglobin 8.0 (L) 12.0 - 16.0 g/dL   O2 Saturation 43.3 %   Carboxyhemoglobin 1.5 0.5 - 1.5 %   Methemoglobin <0.7 0.0 - 1.5 %    Comment: Performed at [facility], [address], [place], [place] [address]   DG CHEST PORT 1 VIEW Result Date: 02/17/2024 CLINICAL DATA:  [id number] Encounter for central line placement [id number]. EXAM: PORTABLE CHEST 1  VIEW COMPARISON:  Earlier the same day at 5:40 a.m. FINDINGS: Low lung volume. Re-demonstration of left retrocardiac airspace opacity obscuring the left hemidiaphragm, descending thoracic aorta and blunting the left lateral costophrenic angle, suggesting combination of left lung atelectasis and/or consolidation with pleural effusion. No significant interval change. Bilateral lung fields are otherwise clear. Right lateral costophrenic angle is clear. No pneumothorax on either side. Stable cardio-mediastinal silhouette. Prosthetic aortic valve noted. Sternotomy wires noted. No acute osseous abnormalities. The soft tissues are within normal limits. *Endotracheal tube tip at the level of clavicular heads is unchanged. *Right-sided PICC line with its tip overlying the cavoatrial junction region, unchanged. *Enteric tube is seen coursing below the left hemidiaphragm. The tip and side hole are not included in the image. *Interval placement of right IJ central venous catheter with its tip overlying the upper portion of superior vena cava. IMPRESSION: 1. Interval placement of right IJ central venous catheter with its tip overlying the upper portion of superior vena cava. No pneumothorax. 2. Otherwise stable exam. Electronically Signed   By: Beula Brunswick M.D.   On: 02/17/2024 15:11   CT HEAD WO CONTRAST ( ) Result Date: 02/17/2024 CLINICAL DATA:  68 year old male with encephalopathy. EXAM: CT HEAD WITHOUT CONTRAST TECHNIQUE: Contiguous axial images were obtained from the base of the skull through the vertex without intravenous contrast. RADIATION DOSE REDUCTION: This exam was performed according to the departmental dose-optimization program which includes automated exposure control, adjustment of the mA and/or kV according to patient size and/or use of iterative reconstruction technique. COMPARISON:  None Available. FINDINGS: Brain: Cerebral volume is within normal limits for age. No midline shift, ventriculomegaly,  mass effect, evidence of mass lesion, intracranial hemorrhage or evidence of cortically based acute infarction. Gray-white matter differentiation is within normal limits throughout the brain. Vascular: No suspicious intracranial vascular hyperdensity. Mild Calcified atherosclerosis at the skull base. Skull: Intact.  No acute osseous abnormality identified. Sinuses/Orbits: Intubated. Left nasoenteric tube in place with an appropriate visible cores. Fluid levels and opacification in the sphenoid, left maxillary sinuses. Partially opacified middle ears and mastoids bilaterally. Other: Intubated with fluid in the visible pharynx. No acute orbit or scalp soft tissue finding identified. IMPRESSION: 1. Normal for age noncontrast CT appearance of the Brain. 2. Intubated. Electronically Signed   By: Marlise Simpers M.D.   On: 02/17/2024 14:12   DG CHEST PORT 1 VIEW Result Date: 02/17/2024 CLINICAL DATA:  Hypoxia EXAM: PORTABLE CHEST 1 VIEW COMPARISON:  02/13/2024 FINDINGS: Right PICC, endotracheal tube and feeding catheter are again noted and stable. Cardiac shadow is enlarged but stable. Postsurgical changes are again noted. Persistent left retrocardiac opacity is seen. Small left effusion is noted. No bony abnormality is noted. IMPRESSION: Stable left lower lobe foot opacity with small effusion. Electronically Signed  By: Violeta Grey M.D.   On: 02/17/2024 09:24    PMH:   Past Medical History:  Diagnosis Date   Hypertension     PSH:   Past Surgical History:  Procedure Laterality Date   BENTALL PROCEDURE N/A 02/01/2024   Procedure: BENTALL PROCEDURE USING A KONECT RESILIA AORTIC VALVE CONDUIT;  Surgeon: Bartley Lightning, MD;  Location: [facility] OR;  Service: Open Heart Surgery;  Laterality: N/A;   CORONARY/GRAFT ACUTE MI REVASCULARIZATION N/A 02/01/2024   Procedure: Coronary/Graft Acute MI Revascularization;  Surgeon: [place]az[place], Peter M, MD;  Location: [facility] INVASIVE CV LAB;  Service: Cardiovascular;  Laterality: N/A;    INTRAOPERATIVE TRANSESOPHAGEAL ECHOCARDIOGRAM N/A 02/01/2024   Procedure: ECHOCARDIOGRAM, TRANSESOPHAGEAL, INTRAOPERATIVE;  Surgeon: Bartley Lightning, MD;  Location: [facility] OR;  Service: Open Heart Surgery;  Laterality: N/A;   RIGHT HEART CATH N/A 02/11/2024   Procedure: RIGHT HEART CATH;  Surgeon: [name], MD;  Location: [facility] INVASIVE CV LAB;  Service: Cardiovascular;  Laterality: N/A;   THORACIC AORTIC ANEURYSM REPAIR N/A 02/01/2024   Procedure: REPAIR OF ASCENDING AORTIC ANEURYSM USING A [id number] HEMASHIELD PLATINUM GRAFT.;  Surgeon: Bartley Lightning, MD;  Location: [facility] OR;  Service: Open Heart Surgery;  Laterality: N/A;  Repair of Aortic Dissection, Right Axillary Cannulation.    Allergies:  Allergies  Allergen Reactions   Bee Venom     Medications:   Prior to Admission medications   Medication Sig Start Date End Date Taking? Authorizing Provider  acetaminophen  (TYLENOL ) 500 MG tablet Take 1,000 mg by mouth 2 (two) times daily as needed for mild pain (pain score 1-3), moderate pain (pain score 4-6), fever or headache.   Yes [provider]  chlorthalidone (HYGROTON) 25 MG tablet Take 25 mg by mouth daily.   Yes [provider]  losartan (COZAAR) 100 MG tablet Take 50 mg by mouth daily. 11/22/23  Yes [provider]  metoprolol  succinate (TOPROL -XL) 100 MG 24 hr tablet Take 50 mg by mouth daily. Take with or immediately following a meal.   Yes [provider]  potassium chloride  (KLOR-CON ) 10 MEQ tablet Take 10 mEq by mouth daily. 01/17/24  Yes [provider]    Discontinued Meds:   Medications Discontinued During This Encounter  Medication Reason   iohexol  (OMNIPAQUE ) 350 MG/ML injection Patient Discharge   norepinephrine  (LEVOPHED ) 4mg  in (0.016 mg/mL) premix infusion Patient Discharge   prasugrel  (EFFIENT ) tablet Patient Discharge   nitroGLYCERIN  1 mg/10 mL (100 mcg/mL) - IR/CATH LAB Patient Discharge   heparin  sodium  (porcine) injection Patient Discharge   midazolam  (VERSED ) injection Patient Discharge   fentaNYL  (SUBLIMAZE ) injection Patient Discharge   Radial Cocktail/Verapamil  only Patient Discharge   Heparin  (Porcine) in NaCl 1000-0.9 UT/500ML-% SOLN Patient Discharge   lidocaine  (PF) (XYLOCAINE ) 1 % injection Patient Discharge   losartan (COZAAR) 50 MG tablet Dose change   acetaminophen  (TYLENOL ) tablet 650 mg    ondansetron  (ZOFRAN ) injection 4 mg    enoxaparin  (LOVENOX ) injection 40 mg    aspirin  EC 81 MG tablet Discontinued by provider   bismuth subsalicylate (PEPTO BISMOL) 262 MG/15ML suspension Patient has not taken in last 30 days   glucosamine-chondroitin 500-400 MG tablet Patient Preference   pantoprazole  (PROTONIX ) 20 MG tablet Completed Course   potassium chloride  (K-DUR,KLOR-CON ) 10 MEQ tablet Duplicate   propofol  (DIPRIVAN ) 10 mg/mL bolus/IV push 60.1 mg    prasugrel  (EFFIENT ) tablet 10 mg    coagulation factor VIIa  recomb (NOVOSEVEN ) injection 5,000 mcg  heparin  sodium (porcine) 2,500 Units, papaverine 30 mg in electrolyte-A (PLASMALYTE-A PH 7.4) 500 mL irrigation Patient Discharge   hemostatic agents (no charge) Optime Patient Discharge   thrombin  recombinant (RECOTHROM ) solution syringe Patient Discharge   Surgifoam 1 Gm with Thrombin  20,000 units (20 ml) topical solution Patient Discharge   0.9 % irrigation (POUR BTL) Patient Discharge   milrinone  (PRIMACOR ) 20 MG/100 ML (0.2 mg/mL) infusion Patient Transfer   nitroGLYCERIN  50 mg in dextrose  5 % 250 mL (0.2 mg/mL) infusion Patient Transfer   phenylephrine  (NEO-SYNEPHRINE) 20mg /NS premix infusion Patient Transfer   potassium chloride  injection 80 mEq Patient Transfer   heparin  30,000 units/NS 1000 mL solution for CELLSAVER Patient Transfer   heparin  sodium (porcine) 2,500 Units, papaverine 30 mg in electrolyte-A (PLASMALYTE-A PH 7.4) 500 mL irrigation Patient Transfer   tranexamic acid  (CYKLOKAPRON ) pump prime solution 240  mg Patient Transfer   ceFAZolin  (ANCEF ) IVPB 2g/100 mL premix Patient Transfer   [facility] Blood Cardioplegia vial (lidocaine /magnesium /mannitol  0.26g-4g-6.4g) Patient Transfer   tranexamic acid  (CYKLOKAPRON ) 2,500 mg in sodium chloride  0.9 % 250 mL (10 mg/mL) infusion Patient Transfer   vasopressin  (PITRESSIN) 20 Units in 100 mL (0.2 unit/mL) infusion-*FOR SHOCK* Patient Transfer   0.9 %  sodium chloride  infusion Patient Transfer   pantoprazole  (PROTONIX ) EC tablet 20 mg    nitroGLYCERIN  (NITROSTAT ) SL tablet 0.4 mg    ondansetron  (ZOFRAN ) injection 4 mg    acetaminophen  (TYLENOL ) tablet 650 mg    enoxaparin  (LOVENOX ) injection 40 mg    sodium chloride  flush (NS) 0.9 % injection 3 mL    sodium chloride  flush (NS) 0.9 % injection 3 mL    0.9 %  sodium chloride  infusion    aspirin  chewable tablet 81 mg    potassium chloride  SA (KLOR-CON  M) CR tablet 20 mEq    magnesium  sulfate IVPB 2 g 50 mL    0.9 %  sodium chloride  infusion    norepinephrine  (LEVOPHED ) 4mg  in (0.016 mg/mL) premix infusion    0.9 %  sodium chloride  infusion (Manually program via Guardrails IV Fluids)    0.9 %  sodium chloride  infusion (Manually program via Guardrails IV Fluids)    0.9 %  sodium chloride  infusion (Manually program via Guardrails IV Fluids)    potassium chloride  10 mEq in 50 mL *CENTRAL LINE* IVPB    metoprolol  tartrate (LOPRESSOR ) tablet 12.5 mg    metoprolol  tartrate (LOPRESSOR ) 25 mg/10 mL oral suspension 12.5 mg    norepinephrine  (LEVOPHED ) 4mg  in (0.016 mg/mL) premix infusion    potassium chloride  10 mEq in 50 mL *CENTRAL LINE* IVPB    aspirin  EC tablet 325 mg    aspirin  chewable tablet 324 mg    sodium bicarbonate  150 mEq in sterile water  1,150 mL infusion    metoprolol  tartrate (LOPRESSOR ) injection 2.5-5 mg    free water  200 mL    docusate sodium  (COLACE) capsule 200 mg    morphine  (PF) 2 MG/ML injection 1-4 mg    pantoprazole  (PROTONIX ) injection 40 mg    pantoprazole  (PROTONIX )  EC tablet 40 mg    traMADol  (ULTRAM ) tablet 50-100 mg    atorvastatin  (LIPITOR) tablet 80 mg    fentaNYL  in NS (58mcg/ml) infusion-PREMIX    nitroGLYCERIN  50 mg in dextrose  5 % 250 mL (0.2 mg/mL) infusion    albumin  human 5 % solution 12.5 g    insulin  regular, human (MYXREDLIN ) 100 units/ 100 mL infusion    dextrose  50 % solution 0-50 mL  furosemide  (LASIX ) 120 mg in dextrose  5 % 50 mL IVPB    metolazone  (ZAROXOLYN ) tablet 5 mg Change in therapy   bisacodyl  (DULCOLAX) EC tablet 10 mg    bisacodyl  (DULCOLAX) suppository 10 mg    senna-docusate (Senokot-S) tablet 2 tablet    oxyCODONE  (Oxy IR/ROXICODONE ) immediate release tablet 5-10 mg    midazolam  (VERSED ) injection 2 mg    fentaNYL  in NS 250mL (10mcg/ml) infusion-PREMIX    aspirin  tablet 325 mg    acetaminophen  (TYLENOL ) tablet 1,000 mg    acetaminophen  (TYLENOL ) 160 MG/5ML solution 1,000 mg    fentaNYL  (SUBLIMAZE ) bolus via infusion 50-100 mcg    dexmedetomidine  (PRECEDEX ) 400 MCG/100ML (4 mcg/mL) infusion    fentaNYL  (SUBLIMAZE ) injection 12.5-50 mcg    methocarbamol  (ROBAXIN ) injection 500 mg    furosemide  (LASIX ) 120 mg in dextrose  5 % 50 mL IVPB    insulin  glargine-yfgn (SEMGLEE ) injection 25 Units    fentaNYL  (SUBLIMAZE ) injection 50-100 mcg    fentaNYL  (SUBLIMAZE ) injection 25 mcg    ketamine  HCl 50 MG/5ML SOSY Returned to ADS   succinylcholine  (ANECTINE ) 200 MG/10ML syringe Returned to ADS   insulin  glargine-yfgn (SEMGLEE ) injection 25 Units    insulin  aspart (novoLOG ) injection 0-20 Units    0.9 %  sodium chloride  infusion (Manually program via Guardrails IV Fluids)    potassium chloride  10 mEq in 100 mL IVPB    vasopressin  (PITRESSIN) 20 Units in 100 mL (0.2 unit/mL) infusion-*FOR SHOCK*    EPINEPHrine  (ADRENALIN ) 5 mg in NS 250 mL (0.02 mg/mL) premix infusion    norepinephrine  (LEVOPHED ) 16 mg in (0.064 mg/mL) premix infusion    metoprolol  tartrate (LOPRESSOR ) injection 2.5-5 mg     aspirin  suppository 300 mg    potassium chloride  10 mEq in 50 mL *CENTRAL LINE* IVPB    sodium chloride  flush (NS) 0.9 % injection 3 mL    sodium chloride  flush (NS) 0.9 % injection 3 mL    sodium chloride  flush (NS) 0.9 % injection 3-10 mL    sodium chloride  flush (NS) 0.9 % injection 3-10 mL    sodium chloride  flush (NS) 0.9 % injection 10-40 mL    sodium chloride  flush (NS) 0.9 % injection 10-40 mL    meropenem  (MERREM ) 1 g in sodium chloride  0.9 % 100 mL IVPB    vancomycin  variable dose per unstable renal function (pharmacist dosing)    lactulose  (CHRONULAC ) 10 GM/15ML solution 20 g    furosemide  (LASIX ) injection 60 mg    feeding supplement (PROSource TF20) liquid 60 mL    lactulose  (CHRONULAC ) 10 GM/15ML solution 20 g    bisacodyl  (DULCOLAX) suppository 10 mg    potassium chloride  (KLOR-CON ) packet 40 mEq    free water  100 mL    piperacillin -tazobactam (ZOSYN ) IVPB 3.375 g Discontinued by provider   dexmedetomidine  (PRECEDEX ) 400 MCG/100ML (4 mcg/mL) infusion    acetaminophen  (TYLENOL ) 160 MG/5ML solution 650 mg    free water  100 mL    insulin  aspart (novoLOG ) injection 0-9 Units    clonazePAM  (KLONOPIN ) tablet 1 mg    clonazePAM  (KLONOPIN ) tablet 1 mg    amiodarone  (NEXTERONE  PREMIX) 360-4.14 MG/200ML-% (1.8 mg/mL) IV infusion    amiodarone  (PACERONE ) tablet 400 mg    amiodarone  (PACERONE ) tablet 400 mg    senna-docusate (Senokot-S) tablet 1 tablet    EPINEPHrine  (ADRENALIN ) 5 mg in NS 250 mL (0.02 mg/mL) premix infusion    magnesium  sulfate IVPB 2 g 50 mL    midazolam  (VERSED )  injection 1-2 mg    Heparin  (Porcine) in NaCl 1000-0.9 UT/500ML-% SOLN Patient Discharge   lidocaine  (PF) (XYLOCAINE ) 1 % injection Patient Discharge   0.9 %  sodium chloride  infusion Patient Transfer   aspirin  chewable tablet 81 mg    clopidogrel  (PLAVIX ) tablet 75 mg    atorvastatin  (LIPITOR) tablet 80 mg    dexmedetomidine  (PRECEDEX ) 400 MCG/100ML (4 mcg/mL) infusion    fentaNYL  in NS  (86mcg/ml) infusion-PREMIX    QUEtiapine  (SEROQUEL ) tablet 50 mg    atorvastatin  (LIPITOR) tablet 80 mg    norepinephrine  (LEVOPHED ) 4mg  in (0.016 mg/mL) premix infusion    aspirin  chewable tablet 81 mg    insulin  glargine-yfgn (SEMGLEE ) injection 5 Units    potassium chloride  (KLOR-CON ) packet 20 mEq    norepinephrine  (LEVOPHED ) 4mg  in (0.016 mg/mL) premix infusion    fentaNYL  (SUBLIMAZE ) bolus via infusion 100 mcg    feeding supplement (VITAL 1.5 CAL) liquid 1,000 mL    feeding supplement (PROSource TF20) liquid 60 mL    vancomycin  (VANCOREADY) IVPB 1750 mg/350 mL    methocarbamol  (ROBAXIN ) injection 500 mg    polyethylene glycol (MIRALAX  / GLYCOLAX ) packet 17 g    free water  200 mL    insulin  aspart (novoLOG ) injection 2 Units    furosemide  (LASIX ) injection 80 mg    metoCLOPramide  (REGLAN ) injection 5 mg    furosemide  (LASIX ) injection 80 mg    polyethylene glycol (MIRALAX  / GLYCOLAX ) packet 17 g    clonazePAM  (KLONOPIN ) tablet 1 mg    QUEtiapine  (SEROQUEL ) tablet 100 mg    oxyCODONE  (Oxy IR/ROXICODONE ) immediate release tablet 5 mg    free water  200 mL    meropenem  (MERREM ) 1 g in sodium chloride  0.9 % 100 mL IVPB    meropenem  (MERREM ) 15 g in sodium chloride  0.9 % 100 mL IVPB    clonazePAM  (KLONOPIN ) tablet 0.5 mg    oxyCODONE  (Oxy IR/ROXICODONE ) immediate release tablet 5 mg    QUEtiapine  (SEROQUEL ) tablet 50 mg    metoCLOPramide  (REGLAN ) injection 5 mg    fentaNYL  (SUBLIMAZE ) bolus via infusion 25-100 mcg    vancomycin  (VANCOREADY) IVPB 1500 mg/300 mL    meropenem  (MERREM ) 1 g in sodium chloride  0.9 % 100 mL IVPB    micafungin  (MYCAMINE ) 100 mg in sodium chloride  0.9 % 100 mL IVPB    clopidogrel  (PLAVIX ) tablet 75 mg    rosuvastatin  (CRESTOR ) tablet 40 mg    polyethylene glycol (MIRALAX  / GLYCOLAX ) packet 17 g    Gerhardt's butt cream    free water  300 mL    metoCLOPramide  (REGLAN ) injection 5 mg     Social History:  reports that he has never  smoked. He does not have any smokeless tobacco history on file. He reports that he does not currently use alcohol . He reports current drug use. Drug: Amphetamines.  Family History:  No family history on file.  Blood pressure (!) 89/59, pulse 67, temperature 99.2 F (37.3 C), temperature source Oral, resp. rate (!) 27, height 6\' 1"  (1.854 m), weight 128.1 kg, SpO2 91%. Physical Exam: General appearance: intubated, following commands Head: NCAT Back: negative Resp: Mechanical breath sounds Cardio: RRR GI: soft, obese + BS Extremities: edema 2+ all four ext Pulses: 2+ and symmetric Access: Right IJ temp     Elois Averitt, Alveda Aures, MD 02/18/2024, 7:21 AM

## 2024-02-18 NOTE — Progress Notes (Signed)
 SLP Cancellation Note  Patient Details Name: Dustin Davenport MRN: [id number] DOB: 1956/08/31   Cancelled treatment:       Reason Eval/Treat Not Completed: Patient with new tracheostomy. Orders for SLP eval and treat for PMSV and swallowing received. Will follow pt closely for readiness for SLP interventions as appropriate.     Amil Kale, M.A., CCC-SLP Speech Language Pathology, Acute Rehabilitation Services  Secure Chat preferred [phone]  02/18/2024, 12:04 PM

## 2024-02-18 NOTE — Procedures (Signed)
 Diagnostic Bronchoscopy  Dustin Davenport  [id number]  [birth year]  Date:02/18/24  Time:11:59 AM   Provider Performing:Brooke Rodolph Clap   Procedure: Diagnostic Bronchoscopy (40981)  Indication(s) Assist with direct visualization of tracheostomy placement  Consent Risks of the procedure as well as the alternatives and risks of each were explained to the patient and/or caregiver.  Consent for the procedure was obtained.   Anesthesia See separate tracheostomy note   Time Out Verified patient identification, verified procedure, site/side was marked, verified correct patient position, special equipment/implants available, medications/allergies/relevant history reviewed, required imaging and test results available.   Sterile Technique Usual hand hygiene, masks, gowns, and gloves were used   Procedure Description Bronchoscope advanced through endotracheal tube and into airway.  After suctioning out tracheal secretions, bronchoscope used to provide direct visualization of tracheostomy placement.   Complications/Tolerance None; patient tolerated the procedure well.   EBL None  Specimen(s) Trach aspirate sent       [name], MSN, AG-ACNP-BC [facility] 02/18/2024, 12:00 PM  See Amion for pager If no response to pager , please call [phone] until 7pm After 7:00 pm call Elink  [phone]

## 2024-02-18 NOTE — Progress Notes (Signed)
 PHARMACY - ANTICOAGULATION CONSULT NOTE  Pharmacy Consult for UFH IV Infusion Indication: atrial fibrillation / LLE DVT  Allergies  Allergen Reactions   Bee Venom     Patient Measurements: Height: 6\' 1"  (185.4 cm) Weight: 128.1 kg (282 lb 6.6 oz) IBW/kg (Calculated) : 79.9 HEPARIN  DW (KG): 106  Vital Signs: Temp: 99.9 F (37.7 C) (05/02 1417) Temp Source: Axillary (05/02 1417) BP: 111/46 (05/02 1645) Pulse Rate: 87 (05/02 1645)  Labs: Recent Labs    02/16/24 0505 02/16/24 1052 02/16/24 1052 02/17/24 0503 02/17/24 1643 02/18/24 0400 02/18/24 1521  HGB  --  7.0*   < > 7.1*  --  7.3*  --   HCT  --  23.2*  --  23.3*  --  24.6*  --   PLT  --  740*  --  783*  --  756*  --   HEPARINUNFRC 0.33  --   --  0.38  --  0.21*  --   CREATININE 1.40*  --   --  1.82* 1.53* 1.30* 1.27*   < > = values in this interval not displayed.    Estimated Creatinine Clearance: 78.1 mL/min (A) (by C-G formula based on SCr of 1.27 mg/dL (H)).   Medical History: Past Medical History:  Diagnosis Date   Hypertension     Medications:   Infusions:   amiodarone  30 mg/hr (02/18/24 1600)   feeding supplement (VITAL 1.5 CAL) 55 mL/hr at 02/18/24 1600   heparin      micafungin  (MYCAMINE ) 150 mg in sodium chloride  0.9 % 100 mL IVPB Stopped (02/18/24 1058)   norepinephrine  (LEVOPHED ) Adult infusion 23 mcg/min (02/18/24 1600)   prismasol  BGK 4/2.5 400 mL/hr at 02/17/24 1556   prismasol  BGK 4/2.5 400 mL/hr at 02/17/24 1556   prismasol  BGK 4/2.5 1,500 mL/hr at 02/18/24 1407   propofol  (DIPRIVAN ) infusion Stopped (02/15/24 0754)   PRN: albuterol , fentaNYL  (SUBLIMAZE ) injection, heparin , midazolam , ondansetron  (ZOFRAN ) IV, mouth rinse, polyvinyl alcohol , sodium chloride , sodium chloride  flush    Assessment: Dustin Davenport is 68 y.o. male with PMH HTN and HLD. Admitted with STEMI now with CGS. Found to have AAA requiring emergent surgical repair. Now s/p Bentall/AVR.  Pharmacy asked to start IV heparin   4/27 for afib.  LLE DVT and superficial thrombus in LUE found on doppler 4/28.  S/p trach ~1200 today, some active oozing in RLL during procedure.  Per RN, trach site has some slightly pink-tinged output from ETT but improved from earlier.  Will monitor closely.   Goal of Therapy:  Heparin  level 0.3-0.5 units/ml Monitor platelets by anticoagulation protocol: Yes   Plan:  RESTART IV Heparin  1600 units/hr 6 hour heparin  level Daily heparin  level and CBC. Monitor s/s bleeding.  Cecillia Cogan, PharmD Clinical Pharmacist 02/18/2024  4:55 PM

## 2024-02-18 NOTE — Plan of Care (Signed)
 Problem: Education: Goal: Knowledge of General Education information will improve Description: Including pain rating scale, medication(s)/side effects and non-pharmacologic comfort measures Outcome: Progressing   Problem: Health Behavior/Discharge Planning: Goal: Ability to manage health-related needs will improve Outcome: Progressing   Problem: Clinical Measurements: Goal: Ability to maintain clinical measurements within normal limits will improve Outcome: Progressing Goal: Will remain free from infection Outcome: Progressing Goal: Diagnostic test results will improve Outcome: Progressing Goal: Respiratory complications will improve Outcome: Progressing Goal: Cardiovascular complication will be avoided Outcome: Progressing   Problem: Activity: Goal: Risk for activity intolerance will decrease Outcome: Progressing   Problem: Nutrition: Goal: Adequate nutrition will be maintained Outcome: Progressing   Problem: Coping: Goal: Level of anxiety will decrease Outcome: Progressing   Problem: Elimination: Goal: Will not experience complications related to bowel motility Outcome: Progressing Goal: Will not experience complications related to urinary retention Outcome: Progressing   Problem: Pain Managment: Goal: General experience of comfort will improve and/or be controlled Outcome: Progressing   Problem: Safety: Goal: Ability to remain free from injury will improve Outcome: Progressing   Problem: Skin Integrity: Goal: Risk for impaired skin integrity will decrease Outcome: Progressing   Problem: Education: Goal: Understanding of cardiac disease, CV risk reduction, and recovery process will improve Outcome: Progressing Goal: Individualized Educational Video(s) Outcome: Progressing   Problem: Activity: Goal: Ability to tolerate increased activity will improve Outcome: Progressing   Problem: Cardiac: Goal: Ability to achieve and maintain adequate cardiovascular  perfusion will improve Outcome: Progressing   Problem: Health Behavior/Discharge Planning: Goal: Ability to safely manage health-related needs after discharge will improve Outcome: Progressing   Problem: Education: Goal: Understanding of CV disease, CV risk reduction, and recovery process will improve Outcome: Progressing Goal: Individualized Educational Video(s) Outcome: Progressing   Problem: Activity: Goal: Ability to return to baseline activity level will improve Outcome: Progressing   Problem: Cardiovascular: Goal: Ability to achieve and maintain adequate cardiovascular perfusion will improve Outcome: Progressing Goal: Vascular access site(s) Level 0-1 will be maintained Outcome: Progressing   Problem: Health Behavior/Discharge Planning: Goal: Ability to safely manage health-related needs after discharge will improve Outcome: Progressing   Problem: Education: Goal: Will demonstrate proper wound care and an understanding of methods to prevent future damage Outcome: Progressing Goal: Knowledge of disease or condition will improve Outcome: Progressing Goal: Knowledge of the prescribed therapeutic regimen will improve Outcome: Progressing Goal: Individualized Educational Video(s) Outcome: Progressing   Problem: Activity: Goal: Risk for activity intolerance will decrease Outcome: Progressing   Problem: Cardiac: Goal: Will achieve and/or maintain hemodynamic stability Outcome: Progressing   Problem: Clinical Measurements: Goal: Postoperative complications will be avoided or minimized Outcome: Progressing   Problem: Respiratory: Goal: Respiratory status will improve Outcome: Progressing   Problem: Skin Integrity: Goal: Wound healing without signs and symptoms of infection Outcome: Progressing Goal: Risk for impaired skin integrity will decrease Outcome: Progressing   Problem: Urinary Elimination: Goal: Ability to achieve and maintain adequate renal perfusion  and functioning will improve Outcome: Progressing   Problem: Safety: Goal: Non-violent Restraint(s) Outcome: Progressing   Problem: Education: Goal: Ability to describe self-care measures that may prevent or decrease complications (Diabetes Survival Skills Education) will improve Outcome: Progressing Goal: Individualized Educational Video(s) Outcome: Progressing   Problem: Coping: Goal: Ability to adjust to condition or change in health will improve Outcome: Progressing   Problem: Fluid Volume: Goal: Ability to maintain a balanced intake and output will improve Outcome: Progressing   Problem: Health Behavior/Discharge Planning: Goal: Ability to identify and utilize available resources and services  will improve Outcome: Progressing Goal: Ability to manage health-related needs will improve Outcome: Progressing   Problem: Metabolic: Goal: Ability to maintain appropriate glucose levels will improve Outcome: Progressing

## 2024-02-18 NOTE — Progress Notes (Signed)
 Nutrition Follow-up  DOCUMENTATION CODES:   Obesity unspecified  INTERVENTION:  Tube Feeding via Cortrak:  Vital 1.5 at 55 ml/hr Pro-Source TF20 60 mL QID TF at goal provides 2300 kcals, 169 g  of protein and 1003 mL of free water    Continue current free water  flushes per MD; free water  flush of 200 mL q 12 hours plus current TF provide 1403 mL of free water  in 24 hours   Renal Multivitamin w/ minerals daily  NUTRITION DIAGNOSIS:  Increased nutrient needs related to acute illness as evidenced by estimated needs. - Ongoing, being addressed via TF   GOAL:   Patient will meet greater than or equal to 90% of their needs - Met via TF  MONITOR:   Vent status, Labs, I & O's, Diet advancement, TF tolerance  REASON FOR ASSESSMENT:   Ventilator    ASSESSMENT:   68 yo male admitted with STEMI requiring extensive RCA stenting; post op chest pain continued and work-up revealed acute type A dissection with ascending aortic aneurysm and taking to OR emergent repair. PMH includes HTN, HLD  4/15 Cath Lab for RCA Stent, post op acute type A dissection  4/16 OR early AM for repair plus Bentall 4/18 Extubated 4/19 Re-Intubated, Bronch  4/21 TF started 4/23 Cortrak placed 5/01 CRRT initiated  5/02 Trach placed  Discussed pt with RN. Plan for trach today, TF have been held for planned procedure. Was tolerating feeds well prior, no concerns. Discussed ok to restart feeds at goal once procedure is complete.  MV: 15.1  MAP (cuff): 91  Enteral Nutrition: Vital 1.5 at 55 ml/hr via Cortrak, Pro-Source TF20 60 mL QID - Free water  of 200 mL q 12 hours since 5/02  Current Wt: 128.1 kg (5/2) Admit Wt: Lowest Wt: 119.3 kg (4/25) Highest Wt: 134.9 kg (4/17)  Drips Amiodarone  Heparin  Levophed   Medications: NovoLog  SSI + 5 units q4h, Semglee , Protonix , Miralax , Micafungin  Labs: Sodium 147, Potassium 3.7, BUN 67, Creatinine 1.30, Phosphorus 3.9, Magnesium  2.9 CBG: 126-168 x 24 hrs    I/O's UOP: 2550 mL x 24 hrs Net UF: 2625 mL x 24 hrs   Diet Order:   Diet Order     None       EDUCATION NEEDS:   Not appropriate for education at this time  Skin:  Skin Assessment: Skin Integrity Issues: Skin Integrity Issues:: Incisions Incisions: Chest incisions DTPI - R buttocks, Sacrum  Last BM:  5/2 - Type 7  Height:  Ht Readings from Last 1 Encounters:  02/05/24 6\' 1"  (1.854 m)   Weight:  Wt Readings from Last 1 Encounters:  02/18/24 128.1 kg    BMI:  Body mass index is 37.26 kg/m.  Estimated Nutritional Needs:  Kcal:  2400-2600 kcal Protein:  150-170 g Fluid:  >2L/day   Doneta Furbish RD, LDN Clinical Dietitian

## 2024-02-18 NOTE — Progress Notes (Signed)
 [name], MRN:  [id number], DOB:  01/10/1956, LOS: 17 ADMISSION DATE:  02/01/2024, CONSULTATION DATE:  02/03/24 REFERRING MD:  Sherene Dilling, CHIEF COMPLAINT:  chest pain   History of Present Illness:  68 year old man w/ hx of HTN, obesity who presented on 4/15 with sudden onset chest pain.  EKG showing inferior STEMI and went to cath lab for RCA DES x 3.  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  Taken emergently to OR for Bentall+ ascending/arch replacement.  Procedure complicated by coagulopathy related to antiplatelet therapy from recent stent, multiple liters of blood products given.  Returned to ICU 4/16 on vent and PCCM consulted to assist with vent wean on 4/17.  Currently heavily sedated; attempts at weaning resulted in agitation without ability to redirect.  Pertinent  Medical History   Past Medical History:  Diagnosis Date   Hypertension     Significant Hospital Events: Including procedures, antibiotic start and stop dates in addition to other pertinent events   4/16 RCA stent then Bentall, aortic arch replacement 4/18 extubated 4/19 reintubated in afternoon for aspiration, bronch, Delirious, low grade fevers, expressive aphasia 4/20 but mental status precludes extubation 4/21 started spiking fever again, meropenem  switched back from Zosyn , on vancomycin  as well 02/07/22 failed spontaneous breathing trial 4/24 Tachypnea, fever. Resp culture sent  4/25 underwent right heart cath showing normal filling pressure 4/26 remained in A-fib, became afebrile, Precedex  was switched to low-dose propofol   Interim History / Subjective:  Tracked for me this am. On CRRT.  Objective   Blood pressure (!) 89/59, pulse 67, temperature 99.2 F (37.3 C), temperature source Oral, resp. rate (!) 27, height 6\' 1"  (1.854 m), weight 128.1 kg, SpO2 91%. CVP:  [10 mmHg-38 mmHg] 14 mmHg  Vent Mode:  PRVC FiO2 (%):  [40 %] 40 % Set Rate:  [20 bmp] 20 bmp Vt Set:  [570 mL] 570 mL PEEP:  [8 cmH20] 8 cmH20 Pressure Support:  [12 cmH20] 12 cmH20 Plateau Pressure:  [22 cmH20-27 cmH20] 27 cmH20   Intake/Output Summary (Last 24 hours) at 02/18/2024 0721 Last data filed at 02/18/2024 0700 Gross per 24 hour  Intake 4334.13 ml  Output 5075 ml  Net -740.87 ml   Filed Weights   02/16/24 0500 02/17/24 0338 02/18/24 0255  Weight: 128.3 kg 128 kg 128.1 kg    Examination: No distress Marked anasarca Pupils equal, brisk Tachypneic on vent with high driving pressures Thick secretions Moving R>L  Stable low H/H Stable thrombocytosis On eraxis Fever curve improved WBC improved  Resolved Hospital Problem list   Constipation Shock combined cardiogenic/septic Hypokalemia/hypophosphatemia Shock liver, improving Perioperative coagulopathy  Assessment & Plan:  Acute inferior wall STEMI status post PCI and DES 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 Improving slowly Perioperative acute blood loss anemia- stable Obesity Leukocytosis-finally improving; most significant finding on CT scan is RLL pneumonia, less impressive dependent findings in left lung Uncontrolled hyperglycemia; A1c 5.3 Hypokalemia Stage 1 sacral ulcer  Ileus, resolved- off reglan  Constipation, resolved LLE DVT- on heparin  gtt  - Trach today, DDAVP  pre- - Vent bundle, PS as able - Push fluid  removal - Micafungin  x 7 days - Insulin  as ordered - Wife updated by phone  Best Practice (right click and "Reselect all SmartList Selections" daily)   Diet/type: Tube  feeds DVT prophylaxis heparin  gtt Pressure ulcer(s): pressure ulcer assessment deferred - stage 1 sacrum GI prophylaxis: PPI Lines: Central line picc Foley:  Yes, and it is still needed Code Status:  full code Last date of multidisciplinary goals of care discussion: 4/28: Met with patient's wife, SIL, brother to discuss trach-- they agreed for this.   Discussed with TCTS, PCCM will take over as primary as there are no outstanding surgical issues  32 min cc time  Josiah Nigh, MD 02/18/24 7:21 AM [facility]  For contact information, see Amion. If no response to pager, please call PCCM consult pager. After hours, 7PM- 7AM, please call Elink.

## 2024-02-19 ENCOUNTER — Inpatient Hospital Stay (HOSPITAL_COMMUNITY)

## 2024-02-19 DIAGNOSIS — J9601 Acute respiratory failure with hypoxia: Secondary | ICD-10-CM | POA: Diagnosis not present

## 2024-02-19 DIAGNOSIS — I5021 Acute systolic (congestive) heart failure: Secondary | ICD-10-CM | POA: Diagnosis not present

## 2024-02-19 DIAGNOSIS — Z930 Tracheostomy status: Secondary | ICD-10-CM

## 2024-02-19 DIAGNOSIS — I480 Paroxysmal atrial fibrillation: Secondary | ICD-10-CM | POA: Diagnosis not present

## 2024-02-19 DIAGNOSIS — I7101 Dissection of ascending aorta: Secondary | ICD-10-CM | POA: Diagnosis not present

## 2024-02-19 DIAGNOSIS — I2111 ST elevation (STEMI) myocardial infarction involving right coronary artery: Secondary | ICD-10-CM | POA: Diagnosis not present

## 2024-02-19 LAB — RENAL FUNCTION PANEL
Albumin: 1.9 g/dL — ABNORMAL LOW (ref 3.5–5.0)
Albumin: 1.8 g/dL — ABNORMAL LOW (ref 3.5–5.0)
Anion gap: 9 (ref 5–15)
Anion gap: 11 (ref 5–15)
BUN: 51 mg/dL — ABNORMAL HIGH (ref 8–23)
BUN: 54 mg/dL — ABNORMAL HIGH (ref 8–23)
CO2: 23 mmol/L (ref 22–32)
CO2: 23 mmol/L (ref 22–32)
Calcium: 7.5 mg/dL — ABNORMAL LOW (ref 8.9–10.3)
Calcium: 7.7 mg/dL — ABNORMAL LOW (ref 8.9–10.3)
Chloride: 101 mmol/L (ref 98–111)
Chloride: 103 mmol/L (ref 98–111)
Creatinine, Ser: 1.16 mg/dL (ref 0.61–1.24)
Creatinine, Ser: 1.26 mg/dL — ABNORMAL HIGH (ref 0.61–1.24)
GFR, Estimated: >60 mL/min (ref >60)
GFR, Estimated: >60 mL/min (ref >60)
Glucose, Bld: 314 mg/dL — ABNORMAL HIGH (ref 70–99)
Glucose, Bld: 258 mg/dL — ABNORMAL HIGH (ref 70–99)
Phosphorus: 3.3 mg/dL (ref 2.5–4.6)
Phosphorus: 3.6 mg/dL (ref 2.5–4.6)
Potassium: 3.7 mmol/L (ref 3.5–5.1)
Potassium: 3.7 mmol/L (ref 3.5–5.1)
Sodium: 133 mmol/L — ABNORMAL LOW (ref 135–145)
Sodium: 137 mmol/L (ref 135–145)

## 2024-02-19 LAB — CBC
HCT: 24.6 % — ABNORMAL LOW (ref 39.0–52.0)
Hemoglobin: 7.2 g/dL — ABNORMAL LOW (ref 13.0–17.0)
MCH: 26.6 pg (ref 26.0–34.0)
MCHC: 29.3 g/dL — ABNORMAL LOW (ref 30.0–36.0)
MCV: 90.8 fL (ref 80.0–100.0)
Platelets: 665 10*3/uL — ABNORMAL HIGH (ref 150–400)
RBC: 2.71 MIL/uL — ABNORMAL LOW (ref 4.22–5.81)
RDW: 19.8 % — ABNORMAL HIGH (ref 11.5–15.5)
WBC: 26.1 10*3/uL — ABNORMAL HIGH (ref 4.0–10.5)
nRBC: 30.3 % — ABNORMAL HIGH (ref 0.0–0.2)

## 2024-02-19 LAB — HEPATIC FUNCTION PANEL
ALT: 124 U/L — ABNORMAL HIGH (ref 0–44)
AST: 346 U/L — ABNORMAL HIGH (ref 15–41)
Albumin: 1.5 g/dL — ABNORMAL LOW (ref 3.5–5.0)
Alkaline Phosphatase: 108 U/L (ref 38–126)
Bilirubin, Direct: 0.4 mg/dL — ABNORMAL HIGH (ref 0.0–0.2)
Indirect Bilirubin: 3.7 mg/dL — ABNORMAL HIGH (ref 0.3–0.9)
Total Bilirubin: 4.1 mg/dL — ABNORMAL HIGH (ref 0.0–1.2)
Total Protein: 4.7 g/dL — ABNORMAL LOW (ref 6.5–8.1)

## 2024-02-19 LAB — CG4 I-STAT (LACTIC ACID): Lactic Acid, Venous: 1.5 mmol/L (ref 0.5–1.9)

## 2024-02-19 LAB — COOXEMETRY PANEL
Carboxyhemoglobin: 1.6 % — ABNORMAL HIGH (ref 0.5–1.5)
Methemoglobin: <0.7 % (ref 0.0–1.5)
O2 Saturation: 64.8 %
Total hemoglobin: 8 g/dL — ABNORMAL LOW (ref 12.0–16.0)

## 2024-02-19 LAB — MAGNESIUM: Magnesium: 2.7 mg/dL — ABNORMAL HIGH (ref 1.7–2.4)

## 2024-02-19 LAB — GLUCOSE, CAPILLARY
Glucose-Capillary: 150 mg/dL — ABNORMAL HIGH (ref 70–99)
Glucose-Capillary: 164 mg/dL — ABNORMAL HIGH (ref 70–99)
Glucose-Capillary: 165 mg/dL — ABNORMAL HIGH (ref 70–99)
Glucose-Capillary: 170 mg/dL — ABNORMAL HIGH (ref 70–99)
Glucose-Capillary: 224 mg/dL — ABNORMAL HIGH (ref 70–99)
Glucose-Capillary: 255 mg/dL — ABNORMAL HIGH (ref 70–99)

## 2024-02-19 LAB — HEPARIN LEVEL (UNFRACTIONATED)
Heparin Unfractionated: 0.13 [IU]/mL — ABNORMAL LOW (ref 0.30–0.70)
Heparin Unfractionated: 0.13 [IU]/mL — ABNORMAL LOW (ref 0.30–0.70)
Heparin Unfractionated: 0.14 [IU]/mL — ABNORMAL LOW (ref 0.30–0.70)

## 2024-02-19 LAB — PROCALCITONIN: Procalcitonin: 1.89 ng/mL

## 2024-02-19 MED ORDER — HYDROMORPHONE HCL 1 MG/ML IJ SOLN
1.0000 mg | INTRAMUSCULAR | Status: DC | PRN
Start: 2024-02-19 — End: 2024-02-25
  Administered 2024-02-19 – 2024-02-21 (×3): 1 mg via INTRAVENOUS
  Administered 2024-02-22 (×2): 2 mg via INTRAVENOUS
  Administered 2024-02-23: 1 mg via INTRAVENOUS
  Administered 2024-02-23 – 2024-02-24 (×5): 2 mg via INTRAVENOUS
  Filled 2024-02-19 (×2): qty 2
  Filled 2024-02-19: qty 1
  Filled 2024-02-19 (×2): qty 2
  Filled 2024-02-19: qty 1
  Filled 2024-02-19: qty 2
  Filled 2024-02-19: qty 1
  Filled 2024-02-19 (×3): qty 2
  Filled 2024-02-19: qty 1

## 2024-02-19 MED ORDER — CLONAZEPAM 0.5 MG PO TABS
0.5000 mg | ORAL_TABLET | Freq: Two times a day (BID) | ORAL | Status: DC
  Administered 2024-02-19 – 2024-02-26 (×15): 0.5 mg
  Filled 2024-02-19 (×15): qty 1

## 2024-02-19 MED ORDER — ACETAMINOPHEN 325 MG PO TABS
650.0000 mg | ORAL_TABLET | Freq: Four times a day (QID) | ORAL | Status: DC
Start: 2024-02-19 — End: 2024-02-27
  Administered 2024-02-19 – 2024-02-27 (×35): 650 mg
  Filled 2024-02-19 (×35): qty 2

## 2024-02-19 MED ORDER — VASOPRESSIN 20 UNITS/100 ML INFUSION FOR SHOCK
0.0000 [IU]/min | INTRAVENOUS | Status: DC
  Administered 2024-02-19 – 2024-02-23 (×9): 0.03 [IU]/min via INTRAVENOUS
  Administered 2024-02-24: 0.04 [IU]/min via INTRAVENOUS
  Administered 2024-02-24 (×2): 0.03 [IU]/min via INTRAVENOUS
  Administered 2024-02-25 – 2024-02-27 (×5): 0.04 [IU]/min via INTRAVENOUS
  Filled 2024-02-19 (×21): qty 100

## 2024-02-19 MED ORDER — METHOCARBAMOL 1000 MG/10ML IJ SOLN
1000.0000 mg | Freq: Three times a day (TID) | INTRAMUSCULAR | Status: AC
  Administered 2024-02-19 – 2024-02-20 (×6): 1000 mg via INTRAVENOUS
  Filled 2024-02-19 (×6): qty 10

## 2024-02-19 MED ORDER — DEXMEDETOMIDINE HCL IN NACL 400 MCG/100ML IV SOLN
0.0000 ug/kg/h | INTRAVENOUS | Status: DC
  Administered 2024-02-19: 0.4 ug/kg/h via INTRAVENOUS
  Administered 2024-02-19 – 2024-02-20 (×2): 0.3 ug/kg/h via INTRAVENOUS
  Filled 2024-02-19 (×3): qty 100

## 2024-02-19 MED ORDER — OXYCODONE HCL 5 MG PO TABS
5.0000 mg | ORAL_TABLET | Freq: Four times a day (QID) | ORAL | Status: DC | PRN
  Administered 2024-02-19 – 2024-02-21 (×5): 5 mg
  Filled 2024-02-19 (×5): qty 1

## 2024-02-19 MED ORDER — SODIUM CHLORIDE 0.9 % IV SOLN
1.0000 g | Freq: Three times a day (TID) | INTRAVENOUS | Status: DC
  Administered 2024-02-19 – 2024-02-25 (×19): 1 g via INTRAVENOUS
  Filled 2024-02-19 (×19): qty 20

## 2024-02-19 MED ORDER — MIDODRINE HCL 5 MG PO TABS: 10.0000 mg | ORAL_TABLET | Freq: Three times a day (TID) | ORAL | Status: DC

## 2024-02-19 MED ORDER — VANCOMYCIN HCL 1500 MG/300ML IV SOLN
1500.0000 mg | INTRAVENOUS | Status: DC
  Administered 2024-02-20 – 2024-02-21 (×2): 1500 mg via INTRAVENOUS
  Filled 2024-02-19 (×3): qty 300

## 2024-02-19 MED ORDER — HYDROMORPHONE HCL 1 MG/ML IJ SOLN
2.0000 mg | INTRAMUSCULAR | Status: DC | PRN
  Administered 2024-02-19: 2 mg via INTRAVENOUS
  Filled 2024-02-19: qty 2

## 2024-02-19 MED ORDER — VANCOMYCIN HCL 2000 MG/400ML IV SOLN
2000.0000 mg | Freq: Once | INTRAVENOUS | Status: AC
  Administered 2024-02-19: 2000 mg via INTRAVENOUS
  Filled 2024-02-19: qty 400

## 2024-02-19 NOTE — Plan of Care (Signed)
   Problem: Clinical Measurements: Goal: Diagnostic test results will improve Outcome: Progressing   Problem: Activity: Goal: Risk for activity intolerance will decrease Outcome: Progressing   Problem: Nutrition: Goal: Adequate nutrition will be maintained Outcome: Progressing

## 2024-02-19 NOTE — Progress Notes (Signed)
 PHARMACY - ANTICOAGULATION CONSULT NOTE  Pharmacy Consult for UFH IV Infusion Indication: atrial fibrillation / LLE DVT  Allergies  Allergen Reactions   Bee Venom     Patient Measurements: Height: 6\' 1"  (185.4 cm) Weight: 128 kg (282 lb 3 oz) IBW/kg (Calculated) : 79.9 HEPARIN  DW (KG): 106  Vital Signs: Temp: 98.4 F (36.9 C) (05/03 1215) Temp Source: Axillary (05/03 1215) BP: 135/67 (05/03 1315) Pulse Rate: 81 (05/03 1315)  Labs: Recent Labs    02/17/24 0503 02/17/24 1643 02/18/24 0400 02/18/24 1521 02/18/24 2339 02/19/24 0420 02/19/24 0635 02/19/24 1306  HGB 7.1*  --  7.3*  --   --  7.2*  --   --   HCT 23.3*  --  24.6*  --   --  24.6*  --   --   PLT 783*  --  756*  --   --  665*  --   --   HEPARINUNFRC 0.38  --  0.21*  --  0.13*  --  0.14* 0.13*  CREATININE 1.82*   < > 1.30* 1.27*  --  1.16  --   --    < > = values in this interval not displayed.    Estimated Creatinine Clearance: 85.4 mL/min (by C-G formula based on SCr of 1.16 mg/dL).   Medical History: Past Medical History:  Diagnosis Date   Hypertension     Medications:   Infusions:   amiodarone  30 mg/hr (02/19/24 1300)   dexmedetomidine  (PRECEDEX ) IV infusion 0.4 mcg/kg/hr (02/19/24 1300)   feeding supplement (VITAL 1.5 CAL) 55 mL/hr at 02/19/24 1300   heparin  1,700 Units/hr (02/19/24 1300)   meropenem  (MERREM ) IV     micafungin  (MYCAMINE ) 150 mg in sodium chloride  0.9 % 100 mL IVPB Stopped (02/19/24 1202)   norepinephrine  (LEVOPHED ) Adult infusion 19 mcg/min (02/19/24 1300)   prismasol  BGK 4/2.5 400 mL/hr at 02/18/24 1740   prismasol  BGK 4/2.5 400 mL/hr at 02/18/24 1740   prismasol  BGK 4/2.5 1,500 mL/hr at 02/19/24 0710   [START ON 02/20/2024] vancomycin      vancomycin  200 mL/hr at 02/19/24 1300   vasopressin  0.03 Units/min (02/19/24 1300)   PRN: albuterol , heparin , HYDROmorphone (DILAUDID) injection, ondansetron  (ZOFRAN ) IV, mouth rinse, polyvinyl alcohol , sodium chloride , sodium chloride   flush    Assessment: Dustin Davenport is 68 y.o. male with PMH HTN and HLD. Admitted with STEMI now with CGS. Found to have AAA requiring emergent surgical repair. Now s/p Bentall/AVR.  Pharmacy asked to start IV heparin  4/27 for afib.  LLE DVT and superficial thrombus in LUE found on doppler 4/28. Afib rate controled on amiodarone   S/p trach ~1200 today, some active oozing in RLL during procedure and around trach site - resolved.  Per RN, trach site has some slightly pink-tinged output from ETT but improved from 5/2. Will monitor closely.   Heparin  drip 1700 uts/hr with heparin  level 0.13 < goal  And CRRT circuit clotting  - increase heparin  drip rate   Goal of Therapy:  Heparin  level 0.3-0.5 units/ml Monitor platelets by anticoagulation protocol: Yes   Plan:  Increase Heparin  drip  rate 1900 units/hr 6 hour heparin  level Daily heparin  level and CBC. Monitor s/s bleeding.  [name] Pharm.D. CPP, BCPS Clinical Pharmacist [phone] 02/19/2024 1:51 PM

## 2024-02-19 NOTE — Progress Notes (Signed)
 8 Days Post-Op Procedure(s) (LRB): RIGHT HEART CATH (N/A) Subjective:  NE 24 for BP support. Amio for atrial fib with rate controlled. In and out of AF. Trached on vent. 40% FiO2 CRRT to remove volume and BUN, -1320/24hrs. Has required more pain meds for restlessness probably due to being uncomfortable.  Objective: Vital signs in last 24 hours: Temp:  [99 F (37.2 C)-99.9 F (37.7 C)] 99.8 F (37.7 C) (05/02 2324) Pulse Rate:  [46-131] 70 (05/03 0900) Cardiac Rhythm: Atrial fibrillation (05/03 0730) Resp:  [16-36] 22 (05/03 0900) BP: (59-177)/(26-98) 139/60 (05/03 0900) SpO2:  [79 %-100 %] 95 % (05/03 0900) FiO2 (%):  [40 %-100 %] 40 % (05/03 0730) Weight:  [962 kg] 128 kg (05/03 0500)  Hemodynamic parameters for last 24 hours: CVP:  [10 mmHg-35 mmHg] 10 mmHg  Intake/Output from previous day: 05/02 0701 - 05/03 0700 In: 2461.6 [I.V.:1108.6; NG/GT:1195; IV Piggyback:158] Out: 3782 [Urine:800; Stool:345] Intake/Output this shift: Total I/O In: 274.4 [I.V.:152.4; Other:12; NG/GT:110] Out: 220 [Stool:10]  General appearance: edema improving Neurologic: sedated Heart: irregularly irregular rhythm Lungs: clear to auscultation bilaterally Abdomen: soft, non-tender; bowel sounds normal Extremities: edema moderate in upper extremities. Feet warm with strong pedal pulses. Wound: incisions healing well.  Lab Results: Recent Labs    02/18/24 0400 02/19/24 0420  WBC 18.2* 26.1*  HGB 7.3* 7.2*  HCT 24.6* 24.6*  PLT 756* 665*   BMET:  Recent Labs    02/18/24 1521 02/19/24 0420  NA 142 133*  K 3.7 3.7  CL 106 101  CO2 23 23  GLUCOSE 300* 314*  BUN 58* 51*  CREATININE 1.27* 1.16  CALCIUM  8.2* 7.5*    PT/INR: No results for input(s): "LABPROT", "INR" in the last 72 hours. ABG    Component Value Date/Time   PHART 7.353 02/12/2024 2335   HCO3 23.2 02/12/2024 2335   TCO2 24 02/12/2024 2335   ACIDBASEDEF 2.0 02/12/2024 2335   O2SAT 64.8 02/19/2024 0635   CBG  (last 3)  Recent Labs    02/18/24 2324 02/19/24 0336 02/19/24 0834  GLUCAP 158* 165* 164*   CXR: stable. Good aeration. No significant effusions.   Assessment/Plan:  Continuing to remove volume and BUN/Creat coming down with CRRT. Still making urine.  VDRF s/p trach. Weaning per CCM.  Low grade temps 99.9. WBC remains elevated but up and down. Repeat BAL GS negative, culture pending. Incisions look good.   LOS: 18 days    Dustin Davenport 02/19/2024

## 2024-02-19 NOTE — Progress Notes (Signed)
 PHARMACY - ANTICOAGULATION CONSULT NOTE  Pharmacy Consult for heparin  Indication:  Afib/DVT  Labs: Recent Labs    02/16/24 1052 02/17/24 0503 02/17/24 1643 02/18/24 0400 02/18/24 1521 02/18/24 2339  HGB 7.0* 7.1*  --  7.3*  --   --   HCT 23.2* 23.3*  --  24.6*  --   --   PLT 740* 783*  --  756*  --   --   HEPARINUNFRC  --  0.38  --  0.21*  --  0.13*  CREATININE  --  1.82* 1.53* 1.30* 1.27*  --    Assessment: 67yo male subtherapeutic on heparin  after resuming post-trach; was previously therapeutic at this rate but level had dropped down some 5/2 am prior to being held; no infusion issues or signs of bleeding per RN.  Goal of Therapy:  Heparin  level 0.3-0.5 units/ml   Plan:  Increase heparin  infusion slightly to 1700 units/hr. Check level in 6 hours.   Lonnie Roberts, PharmD, BCPS 02/19/2024 12:10 AM

## 2024-02-19 NOTE — Progress Notes (Signed)
 NAMEKELI LOY, MRN:  [id number], DOB:  05/19/1956, LOS: 18 ADMISSION DATE:  02/01/2024, CONSULTATION DATE:  02/03/24 REFERRING MD:  Sherene Dilling, CHIEF COMPLAINT:  chest pain   History of Present Illness:  68 year old man w/ hx of HTN, obesity who presented on 4/15 with sudden onset chest pain.  EKG showing inferior STEMI and went to cath lab for RCA DES x 3.  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  Taken emergently to OR for Bentall+ ascending/arch replacement.  Procedure complicated by coagulopathy related to antiplatelet therapy from recent stent, multiple liters of blood products given.  Returned to ICU 4/16 on vent and PCCM consulted to assist with vent wean on 4/17.  Currently heavily sedated; attempts at weaning resulted in agitation without ability to redirect.  Pertinent  Medical History   Past Medical History:  Diagnosis Date   Hypertension     Significant Hospital Events: Including procedures, antibiotic start and stop dates in addition to other pertinent events   4/16 RCA stent then Bentall, aortic arch replacement 4/18 extubated 4/19 reintubated in afternoon for aspiration, bronch, Delirious, low grade fevers, expressive aphasia 4/20 but mental status precludes extubation 4/21 started spiking fever again, meropenem  switched back from Zosyn , on vancomycin  as well 02/07/22 failed spontaneous breathing trial 4/24 Tachypnea, fever. Resp culture sent  4/25 underwent right heart cath showing normal filling pressure 4/26 remained in A-fib, became afebrile, Precedex  was switched to low-dose propofol  5/2 trach  Interim History / Subjective:  Mouthing words, moving everything Anxious and in pain  Objective   Blood pressure (!) 129/55, pulse 69, temperature 99.8 F (37.7 C), temperature source Axillary, resp. rate (!) 30, height 6\' 1"  (1.854 m), weight 128 kg, SpO2 100%. CVP:  [10  mmHg-35 mmHg] 23 mmHg  Vent Mode: PRVC FiO2 (%):  [40 %-100 %] 40 % Set Rate:  [20 bmp] 20 bmp Vt Set:  [570 mL] 570 mL PEEP:  [8 cmH20] 8 cmH20 Plateau Pressure:  [22 cmH20-23 cmH20] 22 cmH20   Intake/Output Summary (Last 24 hours) at 02/19/2024 0717 Last data filed at 02/19/2024 0600 Gross per 24 hour  Intake 2350.52 ml  Output 3782 ml  Net -1431.48 ml   Filed Weights   02/17/24 0338 02/18/24 0255 02/19/24 0500  Weight: 128 kg 128.1 kg 128 kg    Examination: Anxious Moves R>L occasionally to command Thick nonbloody trach secretions Ext with trace edema ongoing Sternotomy looks fine CXR question of worsening retrocardiac opacity: his lungs were a mess yesterday on bronch so not suprised  Resolved Hospital Problem list   Constipation Shock combined cardiogenic/septic Hypokalemia/hypophosphatemia Shock liver, improving Perioperative coagulopathy  Assessment & Plan:  Acute inferior wall STEMI status post PCI and DES 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 Improving slowly Perioperative acute blood loss anemia- stable Obesity Leukocytosis-finally improving; most significant finding on CT scan is RLL pneumonia, less impressive dependent findings in left lung Uncontrolled hyperglycemia; A1c 5.3 Hypokalemia Stage 1 sacral ulcer  Ileus, resolved- off reglan  Constipation, resolved LLE DVT- on heparin  gtt  - Switch to more multimodal pain control strategy - Insulin  as ordered -  Push fluid removal as tolerated by BP - PS trials if we can get him less agitated - If WBC keeps heading in wrong direction need to consider  re-initiation of abx and line holiday - Wife updated at bedside  Best Practice (right click and "Reselect all SmartList Selections" daily)   Diet/type: Tube feeds DVT prophylaxis heparin  gtt Pressure ulcer(s): pressure ulcer assessment deferred - stage 1 sacrum GI prophylaxis: PPI Lines: Central line picc Foley:  Yes, and it is still needed Code Status:  full code Last date of multidisciplinary goals of care discussion: 5/2 updated at bedside  33 min cc time  Josiah Nigh, MD 02/19/24 7:17 AM [facility]  For contact information, see Amion. If no response to pager, please call PCCM consult pager. After hours, 7PM- 7AM, please call Elink.

## 2024-02-19 NOTE — Progress Notes (Signed)
 Advanced Heart Failure Rounding Note  Cardiologist: None   Chief Complaint: STEMI> Cardiogenic shock> Now s/p aortic dissection repair + Bentall  Patient Profile   Dustin Davenport is a 68 y.o. male with HTN and HLD. Admitted with STEMI now with CGS. Found to have AAA and taken emergently to the OR. Now s/p Bentall/AVR and reimplantation of coronaries.  Significant events:    - Echo 4/15: AAA measuring 5-5.7 cm. Followed by emergent TEE 4/15 which was significant for torrential aortic regurgitation and dissection flap extending from the aortic root to the proximal ascending aorta  - CT C/A/P: Showed acute type A dissection with a 5.5 cm ascending aortic aneurysm extending into arch. Dissection goes down to infrarenal aorta and extends up into innominate and left common carotid artery - 4/16 Underwent aortic dissection repair (Bentall/AVR and reimplantation of coronaries. Intra-op course c/b coagulopathy due to Effient  (PCI of RCA earlier in the day)).  - Extubated 4/18 - Re-intubated 4/19. Bronch with diffuse mucopurulent secretions - 4/26 Afib with post conversion pause. Amiodarone  decreased.  - 4/28 Started back on  Vanc + Meropenum and Micafungin  added 4/28 per CCM. Resp Cx + Candida   - 4/28 LLE Venous Doppler +DVT (gastroc vein), LUE Venous Doppler + for superficial cephalic vein thrombosis  -5/1 CT head. No acute findings. Started on CRRT.  -Overnight, patient with increasing pressor requirements; now on 25-30mcg levophed  with MAP 70.   Subjective:   On Norepi 5 mcg. CO-OX 71%.   Remains intubated. On CRRT.  Objective:    Weight Range: 128 kg Body mass index is 37.23 kg/m.   Vital Signs:   Temp:  [98.4 F (36.9 C)-99.9 F (37.7 C)] 98.4 F (36.9 C) (05/03 1215) Pulse Rate:  [46-131] 55 (05/03 1200) Resp:  [16-36] 26 (05/03 1200) BP: (59-163)/(31-98) 119/50 (05/03 1200) SpO2:  [89 %-100 %] 94 % (05/03 1200) FiO2 (%):  [40 %-60 %] 40 % (05/03 1200) Weight:  [128 kg]  128 kg (05/03 0500) Last BM Date : 02/19/24  Weight change: Filed Weights   02/17/24 0338 02/18/24 0255 02/19/24 0500  Weight: 128 kg 128.1 kg 128 kg   Intake/Output:  Intake/Output Summary (Last 24 hours) at 02/19/2024 1225 Last data filed at 02/19/2024 1200 Gross per 24 hour  Intake 3154.84 ml  Output 3682.2 ml  Net -527.36 ml   CVP 10 Physical Exam  General:  trach in place Neck: supple. JVP difficult to assess due to body habitus.  Cor: irregular; rate 100s; no murmur.  Lungs: mechanical lung sounds Abdomen: soft, nontender, nondistended.  Extremities: no cyanosis; edema in upper extremities; mild in lower extremities Neuro: trach in place; easily agitated   Telemetry   Atrial fibrillation; rate controlled.  Labs    CBC Recent Labs    02/18/24 0400 02/19/24 0420  WBC 18.2* 26.1*  HGB 7.3* 7.2*  HCT 24.6* 24.6*  MCV 91.8 90.8  PLT 756* 665*   Basic Metabolic Panel Recent Labs    40/98/11 0400 02/18/24 1521 02/19/24 0420  NA 147* 142 133*  K 3.7 3.7 3.7  CL 110 106 101  CO2 27 23 23   GLUCOSE 180* 300* 314*  BUN 67* 58* 51*  CREATININE 1.30* 1.27* 1.16  CALCIUM  8.4* 8.2* 7.5*  MG 2.9*  --  2.7*  PHOS 3.9 3.8 3.3   Liver Function Tests Recent Labs    02/18/24 1521 02/19/24 0420  ALBUMIN  2.0* 1.9*   D-Dimer No results for input(s): "DDIMER" in  the last 72 hours.  Hemoglobin A1C No results for input(s): "HGBA1C" in the last 72 hours.  Medications:    Scheduled Medications:  acetaminophen   650 mg Per Tube Q6H   Chlorhexidine  Gluconate Cloth  6 each Topical Daily   clonazePAM   0.5 mg Per Tube BID   clopidogrel   75 mg Oral Daily   feeding supplement (PROSource TF20)  60 mL Per Tube QID   Gerhardt's butt cream   Topical Daily   insulin  aspart  0-20 Units Subcutaneous Q4H   insulin  aspart  5 Units Subcutaneous Q4H   insulin  glargine-yfgn  12 Units Subcutaneous BID   methocarbamol  (ROBAXIN ) injection  1,000 mg Intravenous Q8H   midodrine  10  mg Per Tube TID WC   multivitamin  1 tablet Oral QHS   mouth rinse  15 mL Mouth Rinse Q2H   pantoprazole  (PROTONIX ) IV  40 mg Intravenous QHS   polyethylene glycol  17 g Oral Daily   rosuvastatin   40 mg Oral Daily   sodium chloride  flush  10-40 mL Intracatheter Q12H   tranexamic acid   500 mg Nebulization Q8H   Infusions:  amiodarone  30 mg/hr (02/19/24 1200)   dexmedetomidine  (PRECEDEX ) IV infusion 0.4 mcg/kg/hr (02/19/24 1200)   feeding supplement (VITAL 1.5 CAL) 55 mL/hr at 02/19/24 1200   heparin  1,700 Units/hr (02/19/24 1200)   meropenem  (MERREM ) IV     micafungin  (MYCAMINE ) 150 mg in sodium chloride  0.9 % 100 mL IVPB 108 mL/hr at 02/19/24 1200   norepinephrine  (LEVOPHED ) Adult infusion 21 mcg/min (02/19/24 1200)   prismasol  BGK 4/2.5 400 mL/hr at 02/18/24 1740   prismasol  BGK 4/2.5 400 mL/hr at 02/18/24 1740   prismasol  BGK 4/2.5 1,500 mL/hr at 02/19/24 0710   [START ON 02/20/2024] vancomycin      vancomycin      PRN Medications: albuterol , heparin , HYDROmorphone (DILAUDID) injection, ondansetron  (ZOFRAN ) IV, mouth rinse, polyvinyl alcohol , sodium chloride , sodium chloride  flush   Assessment/Plan   Septic/vasoplegic shock - WBC ct up to 26 today from 18.2 after stopping vanc/merrem .  - Restarting vanc/merrem ; remains on micafungin .  - Discussed with CCM - May need repeat CT; order repeating LFTs to assess for GB etiology.  - Repeat lactic acid pending - Mixed venous stable at 65%. CVP 10. Will continue pulling slowly on CRRT.   Acute type A aortic dissection  - Echo 4/15: AAA measuring 5-5.7 cm - Emergent TEE 4/15: significant for torrential aortic regurgitation and dissection flap extending from the aortic root to the proximal ascending aorta  - CT C/A/P: Showed acute type A dissection with a 5.5 cm ascending aortic aneurysm extending into arch. Dissection goes down to infrarenal aorta and extends up into innominate and left common carotid arteries  - Now s/p aortic  dissection repair 02/01/24 (Bentall/AVR and reimplantation of coronaries. - stable.   2. CAD, Acute inferior STEMI - Admitted with STEMI. Post PCI found to also have aortic dissection - LHC with single vessel CAD involving the mid to distal RCA, successful PCI of the RCA with overlapping DES x 3. - No aspirin  with heparin  gtt - Continue plavix  75mg  daily - Continue Crestor  40 mg daily - no changes  3. Acute systolic HF ->  Cardiogenic shock - Post perfusion CGS.  - Echo 4/19: EF 25% RV moderate to severely decreased - Now on levophed  25-33mcg; mixed venous 65%. Appears to have underlying sepsis. Restarting abx. See above.    4. Post-op hypoxic resp failure - extubated 4/18.  - re-intubated 4/19 -  s/p tracheostomy - diffuse mucopurulent secretions on bronch 4/19. BAL GS: Mod WBC. Few gram variable rods. Rare yeast - CT C/A/P 4/27 w/ small postoperative seroma around aortic root/graft and possible PNA.  - RespCx 4/28 + Candida. Currently on micafungin  - See above; restarting broad spec abx.   5. PAF with post-termination pauses PVCs - Patient with AF intra-op. In and out of A fib.  - continue IV amio while on pressor support  - continue heparin  gtt   6. Hypokalemia/hypernatremia - On CRRT - Getting free water .    7. AKI due to ATN/shock - Baseline SCr ~1.3-1.4 - On CRRT -Per Nephrology.   8. ABLA post-op - Received 6uPRBCs, 6 plt, 4 FFP and 5 cryo intra-op.  - Hgb 7.3  today   - Transfuse Hgb < 7.0   9. Acute Venous Thrombosis  - +LLE DVT (gastroc vein) - + LUE superficial cephalic venin thrombosis - heparin  gtt until completion of invasive procedures (anticipate likely trach later in the week)   10. Encephalopathy  - slow to wake up despite being off sedation  - CT head no acute findings.    CRITICAL CARE Performed by: Alwin Baars   Total critical care time: 45 minutes  Critical care time was exclusive of separately billable procedures and treating other  patients.  Critical care was necessary to treat or prevent imminent or life-threatening deterioration.  Critical care was time spent personally by me on the following activities: development of treatment plan with patient and/or surrogate as well as nursing, discussions with consultants, evaluation of patient's response to treatment, examination of patient, obtaining history from patient or surrogate, ordering and performing treatments and interventions, ordering and review of laboratory studies, ordering and review of radiographic studies, pulse oximetry and re-evaluation of patient's condition.    Length of Stay: [address], DO  02/19/2024, 12:25 PM  Advanced Heart Failure Team Pager [phone] (M-F; 7a - 5p)  Please contact [facility] for night-coverage after hours (5p -7a ) and weekends on [email or web address]

## 2024-02-19 NOTE — Progress Notes (Signed)
 PHARMACY ANTIBIOTIC CONSULT NOTE   Dustin Davenport a 68 y.o. male admitted on 4/15 s/p type A dissection and Bentall repair and PCI/DES to RCA. Patient had been on broad spectrum antibiotics with meropenem >zoysn wbc increased and changed back to meropenem  4/24 - stopped 4/30.  WBC  down18 now trend up 26 Tm99 PCT trend back up 1.8 with Cxr with opacity RLL Pharmacy has been re-consulted for Vancomycin  and meropenem  dosing.   Plan: Meropenem  1gm IV q8h Vancomycin  2gm IV x1 then 1250mg  IV q24h  -dose for CRRT Micafungin  150mg  qd Monitor renal function, clinical status, de-escalation, C/S, levels as indicated   Allergies:  Allergies  Allergen Reactions   Bee Venom     Filed Weights   02/17/24 0338 02/18/24 0255 02/19/24 0500  Weight: 128 kg (282 lb 3 oz) 128.1 kg (282 lb 6.6 oz) 128 kg (282 lb 3 oz)       Latest Ref Rng & Units 02/19/2024    4:20 AM 02/18/2024    4:00 AM 02/17/2024    5:03 AM  CBC  WBC 4.0 - 10.5 K/uL 26.1  18.2  20.4   Hemoglobin 13.0 - 17.0 g/dL 7.2  7.3  7.1   Hematocrit 39.0 - 52.0 % 24.6  24.6  23.3   Platelets 150 - 400 K/uL 665  756  783     Antibiotics Given (last 72 hours)     Date/Time Action Medication Dose Rate   02/16/24 1232 New Bag/Given   meropenem  (MERREM ) 1 g in sodium chloride  0.9 % 100 mL IVPB 1 g 200 mL/hr   02/16/24 1308 New Bag/Given   vancomycin  (VANCOREADY) IVPB 1500 mg/300 mL 1,500 mg 150 mL/hr   02/16/24 1943 New Bag/Given   meropenem  (MERREM ) 1 g in sodium chloride  0.9 % 100 mL IVPB 1 g 200 mL/hr   02/17/24 0308 New Bag/Given   meropenem  (MERREM ) 1 g in sodium chloride  0.9 % 100 mL IVPB 1 g 200 mL/hr       Antimicrobials this admission: Ancef  periop 4/15 - 4/17 Vanc 4/19 >4/25 restart 4/28 >4/30 restarted 5/3> Merrem  4/19 > 4/21 restarted 4/24>4/30, rstarted 5/3> Zosyn  4/21 >4/23  Micafungin  4/28>   Microbiology results: 4/15 MRSA: neg (surgical PCR +MSSA) 4/19 BAL: gm variable rod, rare yeast pseudohyphae 4/19 MRSA: neg SA  + 4/24 TA candida albicans    [name] Pharm.D. CPP, BCPS Clinical Pharmacist [phone] 02/19/2024 12:18 PM   Please check AMION for all [facility] phone numbers After 10:00 PM, call Main Pharmacy [phone]

## 2024-02-19 NOTE — Progress Notes (Signed)
 PT Cancellation Note  Patient Details Name: Dustin Davenport MRN: [id number] DOB: 13-Jan-1956   Cancelled Treatment:    Reason Eval/Treat Not Completed: Patient not medically ready. Pt not hemodynamically stable with high levels of pain per RN. Acute PT to sign-off. Please re-consult when medically appropriate.  Orysia Blas, PT, DPT Secure Chat Preferred  Rehab Office [phone]   [name] 02/19/2024, 8:54 AM

## 2024-02-19 NOTE — Progress Notes (Signed)
 Dustin Davenport is an 68 y.o. male  HTN HLD p/w acute chest pain -> acute inferior STEMI emergently to the Cath Lab showing diffuse RCA disease treated with DES x 3.  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  Taken emergently to the OR with aortic dissection repair, AVR and reimplantation of the coronaries on February 02, 2024. EF was down to 20 to 25% with moderate to severely decreased RV as well.  BL creatinine was 1.3-1.4 but peaked at 3.2.   Assessment/Plan: Acute renal failure secondary to ischemic ATN with refractory hypernatremia despite very aggressive free water  replacement every 2 hours as well as severely volume overloaded.  Patient is net +12 L during this hospitalization by what is recorded but on exam possibly even more - Initiating CRRT for renal failure (started 5/1), refractory hypernatremia and volume overload; CRRT as low efficiency and the hypernatremia should slowly correct with low risk of cerebral edema will monitor rate of correction closely.    - Appreciate dialysis catheter placement by CCM  Seen on CRRT all 4K bath Filter clotted this morning, now back on heparin  after the trach (5/2)  Levophed  at 24 mcg; don't go above UF rate of 100 as tolerated. +10L during this hospitalization and will titrate UF based on clinical response  -Monitor Daily I/Os, Daily weight    -Maintain MAP>65 for optimal renal perfusion.  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 Anemia status post multiple units of blood products including RBCs, platelets, FFP and cryo. Acute systolic heart failure followed by advanced heart failure team treated with  Lasix   Subjective: Levophed  up to ; CVP ~12, filter clotted this am (heparin  back on)   Chemistry and CBC: Creatinine, Ser  Date/Time Value Ref Range Status  02/19/2024 04:20 AM 1.16 0.61 - 1.24 mg/dL Final  40/98/1191 47:82 PM 1.27 (H) 0.61 - 1.24 mg/dL Final  95/62/1308 65:78 AM 1.30 (H) 0.61 - 1.24 mg/dL Final  46/96/2952 84:13 PM 1.53 (H) 0.61 - 1.24 mg/dL Final  24/40/1027 25:36 AM 1.82 (H) 0.61 - 1.24 mg/dL Final  64/40/3474 25:95 AM 1.40 (H) 0.61 - 1.24 mg/dL Final  63/87/5643 32:95 PM 1.68 (H) 0.61 - 1.24 mg/dL Final  18/84/1660 63:01 AM 1.71 (H) 0.61 - 1.24 mg/dL Final  60/07/9322 55:73 AM 1.60 (H) 0.61 - 1.24 mg/dL Final  22/11/5425 06:23 AM 1.51 (H) 0.61 - 1.24 mg/dL Final  76/28/3151 76:16 AM 1.41 (H) 0.61 - 1.24 mg/dL Final  07/37/1062 69:48 AM 1.38 (H) 0.61 - 1.24 mg/dL Final  54/62/7035 00:93 PM 1.63 (H) 0.61 - 1.24 mg/dL Final  81/82/9937 16:96 AM 1.62 (H) 0.61 - 1.24 mg/dL Final  78/93/8101 75:10 AM 1.58 (H) 0.61 - 1.24 mg/dL Final  25/85/2778 24:23 PM 1.71 (H) 0.61 - 1.24 mg/dL Final  53/61/4431 54:00 AM 1.61 (H) 0.61 - 1.24 mg/dL Final  86/76/1950 93:26 AM 1.64 (H) 0.61 - 1.24 mg/dL Final  71/24/5809 98:33 PM 1.93 (H) 0.61 - 1.24 mg/dL Final  82/50/5397 67:34 AM 2.27 (H) 0.61 - 1.24 mg/dL Final  19/37/9024 09:73 AM 2.98 (H) 0.61 - 1.24 mg/dL Final  53/29/9242 68:34 AM 2.86 (H) 0.61 - 1.24 mg/dL  Final  02/05/2024 05:11 AM 3.22 (H) 0.61 - 1.24 mg/dL Final  16/07/9603 54:09 AM 3.17 (H) 0.61 - 1.24 mg/dL Final  81/19/1478 29:56 PM 3.32 (H) 0.61 - 1.24 mg/dL Final  21/30/8657 84:69 AM 3.28 (H) 0.61 - 1.24 mg/dL Final  62/95/2841 32:44 AM 2.73 (H) 0.61 - 1.24 mg/dL Final  10/21/7251 66:44 PM 2.26 (H) 0.61 - 1.24 mg/dL Final  03/47/4259 56:38 PM 2.16 (H) 0.61 - 1.24 mg/dL Final  75/64/3329 51:88 AM 1.40 (H) 0.61 - 1.24 mg/dL Final  41/66/0630 16:01 AM 1.40 (H) 0.61 - 1.24 mg/dL Final  09/32/3557 32:20 AM 1.40 (H) 0.61 - 1.24 mg/dL Final  25/42/7062 37:62 AM  1.40 (H) 0.61 - 1.24 mg/dL Final  83/15/1761 60:73 AM 1.40 (H) 0.61 - 1.24 mg/dL Final  71/03/2693 85:46 AM 1.50 (H) 0.61 - 1.24 mg/dL Final  27/12/5007 38:18 PM 1.50 (H) 0.61 - 1.24 mg/dL Final  29/93/7169 67:89 PM 1.60 (H) 0.61 - 1.24 mg/dL Final  38/07/1750 02:58 PM 1.60 (H) 0.61 - 1.24 mg/dL Final  52/77/8242 35:36 PM 1.50 (H) 0.61 - 1.24 mg/dL Final  14/43/1540 08:67 PM 1.70 (H) 0.61 - 1.24 mg/dL Final  61/95/0932 67:12 PM 1.60 (H) 0.61 - 1.24 mg/dL Final  45/80/9983 38:25 AM 1.45 (H) 0.61 - 1.24 mg/dL Final  05/39/7673 41:93 AM 1.30 (H) 0.61 - 1.24 mg/dL Final  79/11/4095 35:32 AM 1.30 (H) 0.61 - 1.24 mg/dL Final  99/24/2683 41:96 AM 0.98 0.61 - 1.24 mg/dL Final   Recent Labs  Lab 02/15/24 0204 02/15/24 1400 02/16/24 0505 02/17/24 0503 02/17/24 1643 02/18/24 0400 02/18/24 1521 02/19/24 0420  NA 150* 154* 152* 152* 151* 147* 142 133*  K 3.0* 3.2* 3.6 3.3* 3.5 3.7 3.7 3.7  CL 110 115* 113* 113* 116* 110 106 101  CO2 25 28 28 29 30 27 23 23   GLUCOSE 255* 194* 201* 200* 202* 180* 300* 314*  BUN 96* 99* 109* 114* 95* 67* 58* 51*  CREATININE 1.71* 1.68* 1.40* 1.82* 1.53* 1.30* 1.27* 1.16  CALCIUM  8.3* 8.6* 8.5* 8.5* 8.4* 8.4* 8.2* 7.5*  PHOS 3.8  --  4.8* 5.1* 4.1 3.9 3.8 3.3   Recent Labs  Lab 02/16/24 1052 02/17/24 0503 02/18/24 0400 02/19/24 0420  WBC 23.8* 20.4* 18.2* 26.1*  HGB 7.0* 7.1* 7.3* 7.2*  HCT 23.2* 23.3* 24.6* 24.6*  MCV 91.3 90.3 91.8 90.8  PLT 740* 783* 756* 665*   Liver Function Tests: Recent Labs  Lab 02/18/24 0400 02/18/24 1521 02/19/24 0420  ALBUMIN  2.0* 2.0* 1.9*   No results for input(s): "LIPASE", "AMYLASE" in the last 168 hours. No results for input(s): "AMMONIA" in the last 168 hours. Cardiac Enzymes: No results for input(s): "CKTOTAL", "CKMB", "CKMBINDEX", "TROPONINI" in the last 168 hours. Iron Studies: No results for input(s): "IRON", "TIBC", "TRANSFERRIN", "FERRITIN" in the last 72  hours. PT/INR: @LABRCNTIP (inr:5)  Xrays/Other Studies: ) Results for orders placed or performed during the hospital encounter of 02/01/24 (from the past 48 hours)  Glucose, capillary     Status: Abnormal   Collection Time: 02/17/24 11:16 AM  Result Value Ref Range   Glucose-Capillary 126 (H) 70 - 99 mg/dL    Comment: Glucose reference range applies only to samples taken after fasting for at least 8 hours.  Glucose, capillary     Status: Abnormal   Collection Time: 02/17/24  3:36 PM  Result Value Ref Range   Glucose-Capillary 128 (H) 70 - 99 mg/dL    Comment: Glucose reference range applies only to samples  taken after fasting for at least 8 hours.  Renal function panel (daily at 1600)     Status: Abnormal   Collection Time: 02/17/24  4:43 PM  Result Value Ref Range   Sodium 151 (H) 135 - 145 mmol/L   Potassium 3.5 3.5 - 5.1 mmol/L   Chloride 116 (H) 98 - 111 mmol/L   CO2 30 22 - 32 mmol/L   Glucose, Bld 202 (H) 70 - 99 mg/dL    Comment: Glucose reference range applies only to samples taken after fasting for at least 8 hours.   BUN 95 (H) 8 - 23 mg/dL   Creatinine, Ser 1.61 (H) 0.61 - 1.24 mg/dL   Calcium  8.4 (L) 8.9 - 10.3 mg/dL   Phosphorus 4.1 2.5 - 4.6 mg/dL   Albumin  1.9 (L) 3.5 - 5.0 g/dL   GFR, Estimated 49 (L) >60 mL/min    Comment: (NOTE) Calculated using the CKD-EPI Creatinine Equation (2021)    Anion gap 5 5 - 15    Comment: Performed at [facility], [address], [place], [place] [address]  Glucose, capillary     Status: Abnormal   Collection Time: 02/17/24  8:01 PM  Result Value Ref Range   Glucose-Capillary 140 (H) 70 - 99 mg/dL    Comment: Glucose reference range applies only to samples taken after fasting for at least 8 hours.  Glucose, capillary     Status: Abnormal   Collection Time: 02/17/24 11:49 PM  Result Value Ref Range   Glucose-Capillary 164 (H) 70 - 99 mg/dL    Comment: Glucose reference range applies only to samples taken after fasting  for at least 8 hours.  Glucose, capillary     Status: Abnormal   Collection Time: 02/18/24  3:55 AM  Result Value Ref Range   Glucose-Capillary 167 (H) 70 - 99 mg/dL    Comment: Glucose reference range applies only to samples taken after fasting for at least 8 hours.  Renal function panel     Status: Abnormal   Collection Time: 02/18/24  4:00 AM  Result Value Ref Range   Sodium 147 (H) 135 - 145 mmol/L   Potassium 3.7 3.5 - 5.1 mmol/L   Chloride 110 98 - 111 mmol/L   CO2 27 22 - 32 mmol/L   Glucose, Bld 180 (H) 70 - 99 mg/dL    Comment: Glucose reference range applies only to samples taken after fasting for at least 8 hours.   BUN 67 (H) 8 - 23 mg/dL   Creatinine, Ser 5.40 (H) 0.61 - 1.24 mg/dL   Calcium  8.4 (L) 8.9 - 10.3 mg/dL   Phosphorus 3.9 2.5 - 4.6 mg/dL   Albumin  2.0 (L) 3.5 - 5.0 g/dL   GFR, Estimated 60 (L) >60 mL/min    Comment: (NOTE) Calculated using the CKD-EPI Creatinine Equation (2021)    Anion gap 10 5 - 15    Comment: Performed at [facility], [address] [place], [place] [address]  Triglycerides     Status: None   Collection Time: 02/18/24  4:00 AM  Result Value Ref Range   Triglycerides 84 <150 mg/dL    Comment: Performed at [facility], [address] [place], [place] [address]  Heparin  level (unfractionated)     Status: Abnormal   Collection Time: 02/18/24  4:00 AM  Result Value Ref Range   Heparin  Unfractionated 0.21 (L) 0.30 - 0.70 IU/mL    Comment: (NOTE) The clinical reportable range  upper limit is being lowered to >1.10 to align with the FDA approved guidance for the current laboratory assay.  If heparin  results are below expected values, and patient dosage has  been confirmed, suggest follow up testing of antithrombin III levels. Performed at [facility], [address] [place], [place] [address]   CBC     Status: Abnormal   Collection Time: 02/18/24  4:00 AM  Result Value Ref Range   WBC 18.2 (H) 4.0 - 10.5 K/uL    RBC 2.68 (L) 4.22 - 5.81 MIL/uL   Hemoglobin 7.3 (L) 13.0 - 17.0 g/dL   HCT 60.4 (L) 54.0 - 98.1 %   MCV 91.8 80.0 - 100.0 fL   MCH 27.2 26.0 - 34.0 pg   MCHC 29.7 (L) 30.0 - 36.0 g/dL   RDW 19.1 (H) 47.8 - 29.5 %   Platelets 756 (H) 150 - 400 K/uL   nRBC 36.7 (H) 0.0 - 0.2 %    Comment: Performed at [facility], [address], [place], [place] [address]  Magnesium      Status: Abnormal   Collection Time: 02/18/24  4:00 AM  Result Value Ref Range   Magnesium  2.9 (H) 1.7 - 2.4 mg/dL    Comment: Performed at [facility], [address] [place], [place] [address]  Cooxemetry Panel (carboxy, met, total hgb, O2 sat)     Status: Abnormal   Collection Time: 02/18/24  6:00 AM  Result Value Ref Range   Total hemoglobin 8.0 (L) 12.0 - 16.0 g/dL   O2 Saturation 46.9 %   Carboxyhemoglobin 1.5 0.5 - 1.5 %   Methemoglobin <0.7 0.0 - 1.5 %    Comment: Performed at [facility], [address], [place], [place] [address]  Glucose, capillary     Status: Abnormal   Collection Time: 02/18/24  8:33 AM  Result Value Ref Range   Glucose-Capillary 168 (H) 70 - 99 mg/dL    Comment: Glucose reference range applies only to samples taken after fasting for at least 8 hours.  Culture, Respiratory w Gram Stain     Status: None (Preliminary result)   Collection Time: 02/18/24 11:57 AM   Specimen: Bronchoalveolar Lavage; Respiratory  Result Value Ref Range   Specimen Description BRONCHIAL ALVEOLAR LAVAGE    Special Requests NONE    Gram Stain      ABUNDANT SQUAMOUS EPITHELIAL CELLS PRESENT NO WBC SEEN NO ORGANISMS SEEN Performed at [facility], [address], [place], [place] [address]    Culture PENDING    Report Status PENDING   Glucose, capillary     Status: Abnormal   Collection Time: 02/18/24 11:57 AM  Result Value Ref Range   Glucose-Capillary 122 (H) 70 - 99 mg/dL    Comment: Glucose reference range applies only to samples taken after fasting for at least 8 hours.   Renal function panel (daily at 1600)     Status: Abnormal   Collection Time: 02/18/24  3:21 PM  Result Value Ref Range   Sodium 142 135 - 145 mmol/L   Potassium 3.7 3.5 - 5.1 mmol/L   Chloride 106 98 - 111 mmol/L   CO2 23 22 - 32 mmol/L   Glucose, Bld 300 (H) 70 - 99 mg/dL    Comment: Glucose reference range applies only to samples taken after fasting for at least 8 hours.   BUN 58 (H) 8 - 23 mg/dL   Creatinine, Ser 4.40 (H)  0.61 - 1.24 mg/dL   Calcium  8.2 (L) 8.9 - 10.3 mg/dL   Phosphorus 3.8 2.5 - 4.6 mg/dL   Albumin  2.0 (L) 3.5 - 5.0 g/dL   GFR, Estimated >40 >98 mL/min    Comment: (NOTE) Calculated using the CKD-EPI Creatinine Equation (2021)    Anion gap 13 5 - 15    Comment: Performed at [facility], [address] [place], [place] [address]  Glucose, capillary     Status: Abnormal   Collection Time: 02/18/24  5:10 PM  Result Value Ref Range   Glucose-Capillary 175 (H) 70 - 99 mg/dL    Comment: Glucose reference range applies only to samples taken after fasting for at least 8 hours.  Glucose, capillary     Status: Abnormal   Collection Time: 02/18/24  8:18 PM  Result Value Ref Range   Glucose-Capillary 154 (H) 70 - 99 mg/dL    Comment: Glucose reference range applies only to samples taken after fasting for at least 8 hours.  Glucose, capillary     Status: Abnormal   Collection Time: 02/18/24 11:24 PM  Result Value Ref Range   Glucose-Capillary 158 (H) 70 - 99 mg/dL    Comment: Glucose reference range applies only to samples taken after fasting for at least 8 hours.  Heparin  level (unfractionated)     Status: Abnormal   Collection Time: 02/18/24 11:39 PM  Result Value Ref Range   Heparin  Unfractionated 0.13 (L) 0.30 - 0.70 IU/mL    Comment: (NOTE) The clinical reportable range upper limit is being lowered to >1.10 to align with the FDA approved guidance for the current laboratory assay.  If heparin  results are below expected values, and patient dosage has   been confirmed, suggest follow up testing of antithrombin III levels. Performed at [facility], [address], [place], [place] [address]   Glucose, capillary     Status: Abnormal   Collection Time: 02/19/24  3:36 AM  Result Value Ref Range   Glucose-Capillary 165 (H) 70 - 99 mg/dL    Comment: Glucose reference range applies only to samples taken after fasting for at least 8 hours.  CBC     Status: Abnormal   Collection Time: 02/19/24  4:20 AM  Result Value Ref Range   WBC 26.1 (H) 4.0 - 10.5 K/uL   RBC 2.71 (L) 4.22 - 5.81 MIL/uL   Hemoglobin 7.2 (L) 13.0 - 17.0 g/dL   HCT 62.1 (L) 30.8 - 65.7 %   MCV 90.8 80.0 - 100.0 fL   MCH 26.6 26.0 - 34.0 pg   MCHC 29.3 (L) 30.0 - 36.0 g/dL   RDW 84.6 (H) 96.2 - 95.2 %   Platelets 665 (H) 150 - 400 K/uL   nRBC 30.3 (H) 0.0 - 0.2 %    Comment: CONFIRMED ON SMEAR Performed at [facility], [address] [place], [place] [address]   Renal function panel (daily at 0500)     Status: Abnormal   Collection Time: 02/19/24  4:20 AM  Result Value Ref Range   Sodium 133 (L) 135 - 145 mmol/L    Comment: DELTA CHECK NOTED   Potassium 3.7 3.5 - 5.1 mmol/L   Chloride 101 98 - 111 mmol/L   CO2 23 22 - 32 mmol/L   Glucose, Bld 314 (H) 70 - 99 mg/dL    Comment: Glucose reference range applies only to samples taken after fasting for at least 8 hours.   BUN  51 (H) 8 - 23 mg/dL   Creatinine, Ser 9.14 0.61 - 1.24 mg/dL   Calcium  7.5 (L) 8.9 - 10.3 mg/dL   Phosphorus 3.3 2.5 - 4.6 mg/dL   Albumin  1.9 (L) 3.5 - 5.0 g/dL   GFR, Estimated >78 >29 mL/min    Comment: (NOTE) Calculated using the CKD-EPI Creatinine Equation (2021)    Anion gap 9 5 - 15    Comment: Performed at [facility], [address] [place], [place] [address]  Magnesium      Status: Abnormal   Collection Time: 02/19/24  4:20 AM  Result Value Ref Range   Magnesium  2.7 (H) 1.7 - 2.4 mg/dL    Comment: Performed at [facility], [address]  [place], [place] [address]  Cooxemetry Panel (carboxy, met, total hgb, O2 sat)     Status: Abnormal   Collection Time: 02/19/24  6:35 AM  Result Value Ref Range   Total hemoglobin 8.0 (L) 12.0 - 16.0 g/dL   O2 Saturation 84.6 %   Carboxyhemoglobin 1.6 (H) 0.5 - 1.5 %   Methemoglobin <0.7 0.0 - 1.5 %    Comment: Performed at [facility], [address], [place], [place] [address]  Heparin  level (unfractionated)     Status: Abnormal   Collection Time: 02/19/24  6:35 AM  Result Value Ref Range   Heparin  Unfractionated 0.14 (L) 0.30 - 0.70 IU/mL    Comment: (NOTE) The clinical reportable range upper limit is being lowered to >1.10 to align with the FDA approved guidance for the current laboratory assay.  If heparin  results are below expected values, and patient dosage has  been confirmed, suggest follow up testing of antithrombin III levels. Performed at [facility], [address] [place], [place] [address]    DG Chest Port 1 View Result Date: 02/18/2024 CLINICAL DATA:  Status post tracheostomy. EXAM: PORTABLE CHEST 1 VIEW COMPARISON:  Feb 17, 2024. FINDINGS: Stable cardiomegaly. Tracheostomy tube is in grossly good position. Feeding tube is seen entering stomach. Status post aortic valve repair. Lungs are clear. Bony thorax is unremarkable. Right internal jugular catheter is unchanged. IMPRESSION: Tracheostomy tube in grossly good position. Otherwise stable support apparatus. Electronically Signed   By: Rosalene Colon M.D.   On: 02/18/2024 12:48   DG CHEST PORT 1 VIEW Result Date: 02/17/2024 CLINICAL DATA:  [id number] Encounter for central line placement [id number]. EXAM: PORTABLE CHEST 1 VIEW COMPARISON:  Earlier the same day at 5:40 a.m. FINDINGS: Low lung volume. Re-demonstration of left retrocardiac airspace opacity obscuring the left hemidiaphragm, descending thoracic aorta and blunting the left lateral costophrenic angle, suggesting combination of left lung atelectasis and/or  consolidation with pleural effusion. No significant interval change. Bilateral lung fields are otherwise clear. Right lateral costophrenic angle is clear. No pneumothorax on either side. Stable cardio-mediastinal silhouette. Prosthetic aortic valve noted. Sternotomy wires noted. No acute osseous abnormalities. The soft tissues are within normal limits. *Endotracheal tube tip at the level of clavicular heads is unchanged. *Right-sided PICC line with its tip overlying the cavoatrial junction region, unchanged. *Enteric tube is seen coursing below the left hemidiaphragm. The tip and side hole are not included in the image. *Interval placement of right IJ central venous catheter with its tip overlying the upper portion of superior vena cava. IMPRESSION: 1. Interval placement of right IJ central venous catheter with its tip overlying the upper portion of superior vena cava. No pneumothorax. 2. Otherwise stable exam. Electronically Signed  By: Beula Brunswick M.D.   On: 02/17/2024 15:11   CT HEAD WO CONTRAST ( ) Result Date: 02/17/2024 CLINICAL DATA:  68 year old male with encephalopathy. EXAM: CT HEAD WITHOUT CONTRAST TECHNIQUE: Contiguous axial images were obtained from the base of the skull through the vertex without intravenous contrast. RADIATION DOSE REDUCTION: This exam was performed according to the departmental dose-optimization program which includes automated exposure control, adjustment of the mA and/or kV according to patient size and/or use of iterative reconstruction technique. COMPARISON:  None Available. FINDINGS: Brain: Cerebral volume is within normal limits for age. No midline shift, ventriculomegaly, mass effect, evidence of mass lesion, intracranial hemorrhage or evidence of cortically based acute infarction. Gray-white matter differentiation is within normal limits throughout the brain. Vascular: No suspicious intracranial vascular hyperdensity. Mild Calcified atherosclerosis at the skull base.  Skull: Intact.  No acute osseous abnormality identified. Sinuses/Orbits: Intubated. Left nasoenteric tube in place with an appropriate visible cores. Fluid levels and opacification in the sphenoid, left maxillary sinuses. Partially opacified middle ears and mastoids bilaterally. Other: Intubated with fluid in the visible pharynx. No acute orbit or scalp soft tissue finding identified. IMPRESSION: 1. Normal for age noncontrast CT appearance of the Brain. 2. Intubated. Electronically Signed   By: Marlise Simpers M.D.   On: 02/17/2024 14:12    PMH:   Past Medical History:  Diagnosis Date   Hypertension     PSH:   Past Surgical History:  Procedure Laterality Date   BENTALL PROCEDURE N/A 02/01/2024   Procedure: BENTALL PROCEDURE USING A KONECT RESILIA AORTIC VALVE CONDUIT;  Surgeon: Bartley Lightning, MD;  Location: [facility] OR;  Service: Open Heart Surgery;  Laterality: N/A;   CORONARY/GRAFT ACUTE MI REVASCULARIZATION N/A 02/01/2024   Procedure: Coronary/Graft Acute MI Revascularization;  Surgeon: [place]az[place], Peter M, MD;  Location: [facility] INVASIVE CV LAB;  Service: Cardiovascular;  Laterality: N/A;   INTRAOPERATIVE TRANSESOPHAGEAL ECHOCARDIOGRAM N/A 02/01/2024   Procedure: ECHOCARDIOGRAM, TRANSESOPHAGEAL, INTRAOPERATIVE;  Surgeon: Bartley Lightning, MD;  Location: [facility] OR;  Service: Open Heart Surgery;  Laterality: N/A;   RIGHT HEART CATH N/A 02/11/2024   Procedure: RIGHT HEART CATH;  Surgeon: [name], MD;  Location: [facility] INVASIVE CV LAB;  Service: Cardiovascular;  Laterality: N/A;   THORACIC AORTIC ANEURYSM REPAIR N/A 02/01/2024   Procedure: REPAIR OF ASCENDING AORTIC ANEURYSM USING A [id number] HEMASHIELD PLATINUM GRAFT.;  Surgeon: Bartley Lightning, MD;  Location: [facility] OR;  Service: Open Heart Surgery;  Laterality: N/A;  Repair of Aortic Dissection, Right Axillary Cannulation.    Allergies:  Allergies  Allergen Reactions   Bee Venom     Medications:   Prior to Admission medications   Medication Sig  Start Date End Date Taking? Authorizing Provider  acetaminophen  (TYLENOL ) 500 MG tablet Take 1,000 mg by mouth 2 (two) times daily as needed for mild pain (pain score 1-3), moderate pain (pain score 4-6), fever or headache.   Yes [provider]  chlorthalidone (HYGROTON) 25 MG tablet Take 25 mg by mouth daily.   Yes [provider]  losartan (COZAAR) 100 MG tablet Take 50 mg by mouth daily. 11/22/23  Yes [provider]  metoprolol  succinate (TOPROL -XL) 100 MG 24 hr tablet Take 50 mg by mouth daily. Take with or immediately following a meal.   Yes [provider]  potassium chloride  (KLOR-CON ) 10 MEQ tablet Take 10 mEq by mouth daily. 01/17/24  Yes [provider]    Discontinued Meds:   Medications Discontinued During This Encounter  Medication Reason   iohexol  (OMNIPAQUE ) 350 MG/ML injection Patient Discharge   norepinephrine  (LEVOPHED ) 4mg  in (0.016 mg/mL) premix infusion Patient Discharge   prasugrel  (EFFIENT ) tablet Patient Discharge   nitroGLYCERIN  1 mg/10 mL (100 mcg/mL) - IR/CATH LAB Patient Discharge   heparin  sodium (porcine) injection Patient Discharge   midazolam  (VERSED ) injection Patient Discharge   fentaNYL  (SUBLIMAZE ) injection Patient Discharge   Radial Cocktail/Verapamil  only Patient Discharge   Heparin  (Porcine) in NaCl 1000-0.9 UT/500ML-% SOLN Patient Discharge   lidocaine  (PF) (XYLOCAINE ) 1 % injection Patient Discharge   losartan (COZAAR) 50 MG tablet Dose change   acetaminophen  (TYLENOL ) tablet 650 mg    ondansetron  (ZOFRAN ) injection 4 mg    enoxaparin  (LOVENOX ) injection 40 mg    aspirin  EC 81 MG tablet Discontinued by provider   bismuth subsalicylate (PEPTO BISMOL) 262 MG/15ML suspension Patient has not taken in last 30 days   glucosamine-chondroitin 500-400 MG tablet Patient Preference   pantoprazole  (PROTONIX ) 20 MG tablet Completed Course   potassium chloride  (K-DUR,KLOR-CON ) 10 MEQ tablet Duplicate   propofol   (DIPRIVAN ) 10 mg/mL bolus/IV push 60.1 mg    prasugrel  (EFFIENT ) tablet 10 mg    coagulation factor VIIa  recomb (NOVOSEVEN ) injection 5,000 mcg    heparin  sodium (porcine) 2,500 Units, papaverine 30 mg in electrolyte-A (PLASMALYTE-A PH 7.4) 500 mL irrigation Patient Discharge   hemostatic agents (no charge) Optime Patient Discharge   thrombin  recombinant (RECOTHROM ) solution syringe Patient Discharge   Surgifoam 1 Gm with Thrombin  20,000 units (20 ml) topical solution Patient Discharge   0.9 % irrigation (POUR BTL) Patient Discharge   milrinone  (PRIMACOR ) 20 MG/100 ML (0.2 mg/mL) infusion Patient Transfer   nitroGLYCERIN  50 mg in dextrose  5 % 250 mL (0.2 mg/mL) infusion Patient Transfer   phenylephrine  (NEO-SYNEPHRINE) 20mg /NS premix infusion Patient Transfer   potassium chloride  injection 80 mEq Patient Transfer   heparin  30,000 units/NS 1000 mL solution for CELLSAVER Patient Transfer   heparin  sodium (porcine) 2,500 Units, papaverine 30 mg in electrolyte-A (PLASMALYTE-A PH 7.4) 500 mL irrigation Patient Transfer   tranexamic acid  (CYKLOKAPRON ) pump prime solution 240 mg Patient Transfer   ceFAZolin  (ANCEF ) IVPB 2g/100 mL premix Patient Transfer   [facility] Blood Cardioplegia vial (lidocaine /magnesium /mannitol  0.26g-4g-6.4g) Patient Transfer   tranexamic acid  (CYKLOKAPRON ) 2,500 mg in sodium chloride  0.9 % 250 mL (10 mg/mL) infusion Patient Transfer   vasopressin  (PITRESSIN) 20 Units in 100 mL (0.2 unit/mL) infusion-*FOR SHOCK* Patient Transfer   0.9 %  sodium chloride  infusion Patient Transfer   pantoprazole  (PROTONIX ) EC tablet 20 mg    nitroGLYCERIN  (NITROSTAT ) SL tablet 0.4 mg    ondansetron  (ZOFRAN ) injection 4 mg    acetaminophen  (TYLENOL ) tablet 650 mg    enoxaparin  (LOVENOX ) injection 40 mg    sodium chloride  flush (NS) 0.9 % injection 3 mL    sodium chloride  flush (NS) 0.9 % injection 3 mL    0.9 %  sodium chloride  infusion    aspirin  chewable tablet 81 mg    potassium  chloride SA (KLOR-CON  M) CR tablet 20 mEq    magnesium  sulfate IVPB 2 g 50 mL    0.9 %  sodium chloride  infusion    norepinephrine  (LEVOPHED ) 4mg  in (0.016 mg/mL) premix infusion    0.9 %  sodium chloride  infusion (Manually program via Guardrails IV Fluids)    0.9 %  sodium chloride  infusion (Manually program via Guardrails IV Fluids)    0.9 %  sodium chloride  infusion (Manually program via Guardrails IV Fluids)  potassium chloride  10 mEq in 50 mL *CENTRAL LINE* IVPB    metoprolol  tartrate (LOPRESSOR ) tablet 12.5 mg    metoprolol  tartrate (LOPRESSOR ) 25 mg/10 mL oral suspension 12.5 mg    norepinephrine  (LEVOPHED ) 4mg  in (0.016 mg/mL) premix infusion    potassium chloride  10 mEq in 50 mL *CENTRAL LINE* IVPB    aspirin  EC tablet 325 mg    aspirin  chewable tablet 324 mg    sodium bicarbonate  150 mEq in sterile water  1,150 mL infusion    metoprolol  tartrate (LOPRESSOR ) injection 2.5-5 mg    free water  200 mL    docusate sodium  (COLACE) capsule 200 mg    morphine  (PF) 2 MG/ML injection 1-4 mg    pantoprazole  (PROTONIX ) injection 40 mg    pantoprazole  (PROTONIX ) EC tablet 40 mg    traMADol  (ULTRAM ) tablet 50-100 mg    atorvastatin  (LIPITOR) tablet 80 mg    fentaNYL  in NS (66mcg/ml) infusion-PREMIX    nitroGLYCERIN  50 mg in dextrose  5 % 250 mL (0.2 mg/mL) infusion    albumin  human 5 % solution 12.5 g    insulin  regular, human (MYXREDLIN ) 100 units/ 100 mL infusion    dextrose  50 % solution 0-50 mL    furosemide  (LASIX ) 120 mg in dextrose  5 % 50 mL IVPB    metolazone  (ZAROXOLYN ) tablet 5 mg Change in therapy   bisacodyl  (DULCOLAX) EC tablet 10 mg    bisacodyl  (DULCOLAX) suppository 10 mg    senna-docusate (Senokot-S) tablet 2 tablet    oxyCODONE  (Oxy IR/ROXICODONE ) immediate release tablet 5-10 mg    midazolam  (VERSED ) injection 2 mg    fentaNYL  in NS 250mL (10mcg/ml) infusion-PREMIX    aspirin  tablet 325 mg    acetaminophen  (TYLENOL ) tablet 1,000 mg     acetaminophen  (TYLENOL ) 160 MG/5ML solution 1,000 mg    fentaNYL  (SUBLIMAZE ) bolus via infusion 50-100 mcg    dexmedetomidine  (PRECEDEX ) 400 MCG/100ML (4 mcg/mL) infusion    fentaNYL  (SUBLIMAZE ) injection 12.5-50 mcg    methocarbamol  (ROBAXIN ) injection 500 mg    furosemide  (LASIX ) 120 mg in dextrose  5 % 50 mL IVPB    insulin  glargine-yfgn (SEMGLEE ) injection 25 Units    fentaNYL  (SUBLIMAZE ) injection 50-100 mcg    fentaNYL  (SUBLIMAZE ) injection 25 mcg    ketamine  HCl 50 MG/5ML SOSY Returned to ADS   succinylcholine  (ANECTINE ) 200 MG/10ML syringe Returned to ADS   insulin  glargine-yfgn (SEMGLEE ) injection 25 Units    insulin  aspart (novoLOG ) injection 0-20 Units    0.9 %  sodium chloride  infusion (Manually program via Guardrails IV Fluids)    potassium chloride  10 mEq in 100 mL IVPB    vasopressin  (PITRESSIN) 20 Units in 100 mL (0.2 unit/mL) infusion-*FOR SHOCK*    EPINEPHrine  (ADRENALIN ) 5 mg in NS 250 mL (0.02 mg/mL) premix infusion    norepinephrine  (LEVOPHED ) 16 mg in 250mL (0.064 mg/mL) premix infusion    metoprolol  tartrate (LOPRESSOR ) injection 2.5-5 mg    aspirin  suppository 300 mg    potassium chloride  10 mEq in 50 mL *CENTRAL LINE* IVPB    sodium chloride  flush (NS) 0.9 % injection 3 mL    sodium chloride  flush (NS) 0.9 % injection 3 mL    sodium chloride  flush (NS) 0.9 % injection 3-10 mL    sodium chloride  flush (NS) 0.9 % injection 3-10 mL    sodium chloride  flush (NS) 0.9 % injection 10-40 mL    sodium chloride  flush (NS) 0.9 % injection 10-40 mL    meropenem  (  MERREM ) 1 g in sodium chloride  0.9 % 100 mL IVPB    vancomycin  variable dose per unstable renal function (pharmacist dosing)    lactulose  (CHRONULAC ) 10 GM/15ML solution 20 g    furosemide  (LASIX ) injection 60 mg    feeding supplement (PROSource TF20) liquid 60 mL    lactulose  (CHRONULAC ) 10 GM/15ML solution 20 g    bisacodyl  (DULCOLAX) suppository 10 mg    potassium chloride  (KLOR-CON ) packet 40 mEq    free  water  100 mL    piperacillin -tazobactam (ZOSYN ) IVPB 3.375 g Discontinued by provider   dexmedetomidine  (PRECEDEX ) 400 MCG/100ML (4 mcg/mL) infusion    acetaminophen  (TYLENOL ) 160 MG/5ML solution 650 mg    free water  100 mL    insulin  aspart (novoLOG ) injection 0-9 Units    clonazePAM  (KLONOPIN ) tablet 1 mg    clonazePAM  (KLONOPIN ) tablet 1 mg    amiodarone  (NEXTERONE  PREMIX) 360-4.14 MG/200ML-% (1.8 mg/mL) IV infusion    amiodarone  (PACERONE ) tablet 400 mg    amiodarone  (PACERONE ) tablet 400 mg    senna-docusate (Senokot-S) tablet 1 tablet    EPINEPHrine  (ADRENALIN ) 5 mg in NS 250 mL (0.02 mg/mL) premix infusion    magnesium  sulfate IVPB 2 g 50 mL    midazolam  (VERSED ) injection 1-2 mg    Heparin  (Porcine) in NaCl 1000-0.9 UT/500ML-% SOLN Patient Discharge   lidocaine  (PF) (XYLOCAINE ) 1 % injection Patient Discharge   0.9 %  sodium chloride  infusion Patient Transfer   aspirin  chewable tablet 81 mg    clopidogrel  (PLAVIX ) tablet 75 mg    atorvastatin  (LIPITOR) tablet 80 mg    dexmedetomidine  (PRECEDEX ) 400 MCG/100ML (4 mcg/mL) infusion    fentaNYL  in NS (43mcg/ml) infusion-PREMIX    QUEtiapine  (SEROQUEL ) tablet 50 mg    atorvastatin  (LIPITOR) tablet 80 mg    norepinephrine  (LEVOPHED ) 4mg  in (0.016 mg/mL) premix infusion    aspirin  chewable tablet 81 mg    insulin  glargine-yfgn (SEMGLEE ) injection 5 Units    potassium chloride  (KLOR-CON ) packet 20 mEq    norepinephrine  (LEVOPHED ) 4mg  in (0.016 mg/mL) premix infusion    fentaNYL  (SUBLIMAZE ) bolus via infusion 100 mcg    feeding supplement (VITAL 1.5 CAL) liquid 1,000 mL    feeding supplement (PROSource TF20) liquid 60 mL    vancomycin  (VANCOREADY) IVPB 1750 mg/350 mL    methocarbamol  (ROBAXIN ) injection 500 mg    polyethylene glycol (MIRALAX  / GLYCOLAX ) packet 17 g    free water  200 mL    insulin  aspart (novoLOG ) injection 2 Units    furosemide  (LASIX ) injection 80 mg    metoCLOPramide  (REGLAN ) injection  5 mg    furosemide  (LASIX ) injection 80 mg    polyethylene glycol (MIRALAX  / GLYCOLAX ) packet 17 g    clonazePAM  (KLONOPIN ) tablet 1 mg    QUEtiapine  (SEROQUEL ) tablet 100 mg    oxyCODONE  (Oxy IR/ROXICODONE ) immediate release tablet 5 mg    free water  200 mL    meropenem  (MERREM ) 1 g in sodium chloride  0.9 % 100 mL IVPB    meropenem  (MERREM ) 15 g in sodium chloride  0.9 % 100 mL IVPB    clonazePAM  (KLONOPIN ) tablet 0.5 mg    oxyCODONE  (Oxy IR/ROXICODONE ) immediate release tablet 5 mg    QUEtiapine  (SEROQUEL ) tablet 50 mg    metoCLOPramide  (REGLAN ) injection 5 mg    fentaNYL  (SUBLIMAZE ) bolus via infusion 25-100 mcg    vancomycin  (VANCOREADY) IVPB 1500 mg/300 mL    meropenem  (MERREM ) 1 g in sodium chloride  0.9 % 100  mL IVPB    micafungin  (MYCAMINE ) 100 mg in sodium chloride  0.9 % 100 mL IVPB    clopidogrel  (PLAVIX ) tablet 75 mg    rosuvastatin  (CRESTOR ) tablet 40 mg    polyethylene glycol (MIRALAX  / GLYCOLAX ) packet 17 g    Gerhardt's butt cream    free water  300 mL    metoCLOPramide  (REGLAN ) injection 5 mg    free water  200 mL    insulin  glargine-yfgn (SEMGLEE ) injection 12 Units    succinylcholine  (ANECTINE ) 200 MG/10ML syringe Returned to ADS   fentaNYL  (SUBLIMAZE ) 50 MCG/ML injection Returned to ADS   ketamine  HCl 50 MG/5ML SOSY Returned to ADS   heparin  ADULT infusion 100 units/mL (25000 units/250mL)    fentaNYL  (SUBLIMAZE ) injection 25-100 mcg    propofol  (DIPRIVAN ) 1000 MG/100ML infusion    free water  200 mL     Social History:  reports that he has never smoked. He does not have any smokeless tobacco history on file. He reports that he does not currently use alcohol . He reports current drug use. Drug: Amphetamines.  Family History:  No family history on file.  Blood pressure (!) 148/37, pulse (!) 58, temperature 99.8 F (37.7 C), temperature source Axillary, resp. rate (!) 30, height 6\' 1"  (1.854 m), weight 128 kg, SpO2 100%. Physical Exam: General appearance:  intubated Head: NCAT Back: negative Resp: Mechanical breath sounds Cardio: RRR GI: soft, obese + BS Extremities: edema 2+ all four ext Pulses: 2+ and symmetric Access: Right IJ temp     Jammie Troup, Alveda Aures, MD 02/19/2024, 8:11 AM

## 2024-02-20 DIAGNOSIS — I7101 Dissection of ascending aorta: Secondary | ICD-10-CM | POA: Diagnosis not present

## 2024-02-20 DIAGNOSIS — I480 Paroxysmal atrial fibrillation: Secondary | ICD-10-CM | POA: Diagnosis not present

## 2024-02-20 DIAGNOSIS — I5021 Acute systolic (congestive) heart failure: Secondary | ICD-10-CM | POA: Diagnosis not present

## 2024-02-20 DIAGNOSIS — J9601 Acute respiratory failure with hypoxia: Secondary | ICD-10-CM | POA: Diagnosis not present

## 2024-02-20 DIAGNOSIS — I2111 ST elevation (STEMI) myocardial infarction involving right coronary artery: Secondary | ICD-10-CM | POA: Diagnosis not present

## 2024-02-20 LAB — RENAL FUNCTION PANEL
Albumin: 1.8 g/dL — ABNORMAL LOW (ref 3.5–5.0)
Albumin: 1.9 g/dL — ABNORMAL LOW (ref 3.5–5.0)
Anion gap: 8 (ref 5–15)
Anion gap: 11 (ref 5–15)
BUN: 69 mg/dL — ABNORMAL HIGH (ref 8–23)
BUN: 83 mg/dL — ABNORMAL HIGH (ref 8–23)
CO2: 23 mmol/L (ref 22–32)
CO2: 22 mmol/L (ref 22–32)
Calcium: 7.7 mg/dL — ABNORMAL LOW (ref 8.9–10.3)
Calcium: 8.2 mg/dL — ABNORMAL LOW (ref 8.9–10.3)
Chloride: 105 mmol/L (ref 98–111)
Chloride: 107 mmol/L (ref 98–111)
Creatinine, Ser: 1.33 mg/dL — ABNORMAL HIGH (ref 0.61–1.24)
Creatinine, Ser: 1.44 mg/dL — ABNORMAL HIGH (ref 0.61–1.24)
GFR, Estimated: 58 mL/min — ABNORMAL LOW (ref >60)
GFR, Estimated: 53 mL/min — ABNORMAL LOW (ref >60)
Glucose, Bld: 257 mg/dL — ABNORMAL HIGH (ref 70–99)
Glucose, Bld: 161 mg/dL — ABNORMAL HIGH (ref 70–99)
Phosphorus: 3.8 mg/dL (ref 2.5–4.6)
Phosphorus: 4.3 mg/dL (ref 2.5–4.6)
Potassium: 3.4 mmol/L — ABNORMAL LOW (ref 3.5–5.1)
Potassium: 3.6 mmol/L (ref 3.5–5.1)
Sodium: 136 mmol/L (ref 135–145)
Sodium: 140 mmol/L (ref 135–145)

## 2024-02-20 LAB — GLUCOSE, CAPILLARY
Glucose-Capillary: 225 mg/dL — ABNORMAL HIGH (ref 70–99)
Glucose-Capillary: 242 mg/dL — ABNORMAL HIGH (ref 70–99)
Glucose-Capillary: 148 mg/dL — ABNORMAL HIGH (ref 70–99)
Glucose-Capillary: 144 mg/dL — ABNORMAL HIGH (ref 70–99)
Glucose-Capillary: 149 mg/dL — ABNORMAL HIGH (ref 70–99)
Glucose-Capillary: 134 mg/dL — ABNORMAL HIGH (ref 70–99)

## 2024-02-20 LAB — HEMOGLOBIN AND HEMATOCRIT, BLOOD
HCT: 26.8 % — ABNORMAL LOW (ref 39.0–52.0)
Hemoglobin: 8 g/dL — ABNORMAL LOW (ref 13.0–17.0)

## 2024-02-20 LAB — HEPARIN LEVEL (UNFRACTIONATED): Heparin Unfractionated: 0.28 [IU]/mL — ABNORMAL LOW (ref 0.30–0.70)

## 2024-02-20 LAB — CBC
HCT: 23.4 % — ABNORMAL LOW (ref 39.0–52.0)
Hemoglobin: 6.9 g/dL — CL (ref 13.0–17.0)
MCH: 27.3 pg (ref 26.0–34.0)
MCHC: 29.5 g/dL — ABNORMAL LOW (ref 30.0–36.0)
MCV: 92.5 fL (ref 80.0–100.0)
Platelets: 615 10*3/uL — ABNORMAL HIGH (ref 150–400)
RBC: 2.53 MIL/uL — ABNORMAL LOW (ref 4.22–5.81)
RDW: 19.9 % — ABNORMAL HIGH (ref 11.5–15.5)
WBC: 29.7 10*3/uL — ABNORMAL HIGH (ref 4.0–10.5)
nRBC: 29.6 % — ABNORMAL HIGH (ref 0.0–0.2)

## 2024-02-20 LAB — CULTURE, RESPIRATORY W GRAM STAIN

## 2024-02-20 LAB — COOXEMETRY PANEL
Carboxyhemoglobin: 1.1 % (ref 0.5–1.5)
Methemoglobin: <0.7 % (ref 0.0–1.5)
O2 Saturation: 66.6 %
Total hemoglobin: <6.9 g/dL — CL (ref 12.0–16.0)

## 2024-02-20 LAB — MAGNESIUM: Magnesium: 2.7 mg/dL — ABNORMAL HIGH (ref 1.7–2.4)

## 2024-02-20 LAB — PREPARE RBC (CROSSMATCH)

## 2024-02-20 MED ORDER — POTASSIUM CHLORIDE 10 MEQ/50ML IV SOLN
10.0000 meq | INTRAVENOUS | Status: AC
  Administered 2024-02-20 (×4): 10 meq via INTRAVENOUS
  Filled 2024-02-20 (×4): qty 50

## 2024-02-20 MED ORDER — POTASSIUM CHLORIDE 10 MEQ/100ML IV SOLN
10.0000 meq | INTRAVENOUS | Status: DC
  Filled 2024-02-20: qty 100

## 2024-02-20 MED ORDER — SCOPOLAMINE 1 MG/3DAYS TD PT72
1.0000 | MEDICATED_PATCH | TRANSDERMAL | Status: DC
  Administered 2024-02-20: 1.5 mg via TRANSDERMAL
  Filled 2024-02-20: qty 1

## 2024-02-20 MED ORDER — INSULIN GLARGINE-YFGN 100 UNIT/ML ~~LOC~~ SOLN
20.0000 [IU] | Freq: Two times a day (BID) | SUBCUTANEOUS | Status: DC
  Administered 2024-02-20 – 2024-02-27 (×15): 20 [IU] via SUBCUTANEOUS
  Filled 2024-02-20 (×17): qty 0.2

## 2024-02-20 MED ORDER — QUETIAPINE FUMARATE 50 MG PO TABS
50.0000 mg | ORAL_TABLET | Freq: Two times a day (BID) | ORAL | Status: DC
  Administered 2024-02-20 – 2024-02-22 (×6): 50 mg
  Filled 2024-02-20 (×6): qty 1

## 2024-02-20 MED ORDER — SODIUM CHLORIDE 0.9% IV SOLUTION: Freq: Once | INTRAVENOUS | Status: DC

## 2024-02-20 MED ORDER — AMIODARONE HCL 200 MG PO TABS
400.0000 mg | ORAL_TABLET | Freq: Two times a day (BID) | ORAL | Status: DC
  Administered 2024-02-20 – 2024-02-22 (×5): 400 mg
  Filled 2024-02-20 (×5): qty 2

## 2024-02-20 MED ORDER — FUROSEMIDE 10 MG/ML IJ SOLN
80.0000 mg | Freq: Two times a day (BID) | INTRAMUSCULAR | Status: DC
  Administered 2024-02-20 – 2024-02-22 (×6): 80 mg via INTRAVENOUS
  Filled 2024-02-20 (×6): qty 8

## 2024-02-20 NOTE — Progress Notes (Signed)
 eLink Physician-Brief Progress Note [name] DOB: 1956/05/11 MRN: [id number]   Date of Service  02/20/2024  HPI/Events of Note  Hemoglobin 6.9, remains on norepinephrine  and vasopressin   K3.4  eICU Interventions  Transfuse 1 unit PRBC in the setting of shock  KCl IV     Intervention Category Intermediate Interventions: Bleeding - evaluation and treatment with blood products  Laquon Emel 02/20/2024, 4:59 AM

## 2024-02-20 NOTE — Progress Notes (Signed)
 NAMETELFORD GIRDNER, MRN:  [id number], DOB:  1955-11-01, LOS: 19 ADMISSION DATE:  02/01/2024, CONSULTATION DATE:  02/03/24 REFERRING MD:  Sherene Dilling, CHIEF COMPLAINT:  chest pain   History of Present Illness:  68 year old man w/ hx of HTN, obesity who presented on 4/15 with sudden onset chest pain.  EKG showing inferior STEMI and went to cath lab for RCA DES x 3.  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  Taken emergently to OR for Bentall+ ascending/arch replacement.  Procedure complicated by coagulopathy related to antiplatelet therapy from recent stent, multiple liters of blood products given.  Returned to ICU 4/16 on vent and PCCM consulted to assist with vent wean on 4/17.  Currently heavily sedated; attempts at weaning resulted in agitation without ability to redirect.  Pertinent  Medical History   Past Medical History:  Diagnosis Date   Hypertension     Significant Hospital Events: Including procedures, antibiotic start and stop dates in addition to other pertinent events   4/16 RCA stent then Bentall, aortic arch replacement 4/18 extubated 4/19 reintubated in afternoon for aspiration, bronch, Delirious, low grade fevers, expressive aphasia 4/20 but mental status precludes extubation 4/21 started spiking fever again, meropenem  switched back from Zosyn , on vancomycin  as well 02/07/22 failed spontaneous breathing trial 4/24 Tachypnea, fever. Resp culture sent  4/25 underwent right heart cath showing normal filling pressure 4/26 remained in A-fib, became afebrile, Precedex  was switched to low-dose propofol  5/2 trach  Interim History / Subjective:  Waxing/waning alertness levels, tolerated TC for a couple hours yesterday.  Objective   Blood pressure 123/73, pulse (!) 43, temperature 97.9 F (36.6 C), temperature source Axillary, resp. rate (!) 28, height 6\' 1"  (1.854 m), weight 116.8 kg, SpO2  97%. CVP:  [3 mmHg-23 mmHg] 9 mmHg  Vent Mode: PRVC FiO2 (%):  [40 %-50 %] 40 % Set Rate:  [20 bmp] 20 bmp Vt Set:  [570 mL] 570 mL PEEP:  [5 cmH20-8 cmH20] 8 cmH20 Pressure Support:  [10 cmH20] 10 cmH20   Intake/Output Summary (Last 24 hours) at 02/20/2024 0738 Last data filed at 02/20/2024 0700 Gross per 24 hour  Intake 4454.52 ml  Output 3384.6 ml  Net 1069.92 ml   Filed Weights   02/18/24 0255 02/19/24 0500 02/20/24 0630  Weight: 128.1 kg 128 kg 116.8 kg    Examination: Mild-moderate anasarca diffuse Moves to command Some ?myoclonus vs just tremor in RUE>LUE Globally weak Lung sounds distant Trach CDI, small thick secretions Ext warm  H/H down slightly WBC back up  Resolved Hospital Problem list   Constipation Shock combined cardiogenic/septic Hypokalemia/hypophosphatemia Shock liver, improving Perioperative coagulopathy  Assessment & Plan:  Acute inferior wall STEMI status post PCI and DES 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 secrtions Acute kidney injury due to ischemic ATN from shock, stable CRRT held 5/3 Acute encephalopathy, likely septic and uremia. Improving slowly; waxing waning; would not be surprised given history if he has a bunch of small strokes Perioperative acute blood loss anemia- stable; now just related to bone marrow stunning and phlebotomy Obesity Leukocytosis- still an issue Uncontrolled hyperglycemia; A1c 5.3 Hypokalemia Stage 1 sacral ulcer  Ileus, resolved- off reglan   Constipation, resolved LLE DVT- on heparin  gtt  - Continue multimodal pain control strategy, add seroquel  as this now seems more c/w delirium - Wean  precedex  as able - Insulin  going up - Continue plavix  for RCA stent - Will discuss line holiday with RN, AHF, nephro - TC trials as able - Wife updated at bedside 5/3  Best Practice (right click and "Reselect all SmartList Selections" daily)   Diet/type: Tube feeds DVT prophylaxis heparin  gtt Pressure ulcer(s): yes, see nursing documentation GI prophylaxis: PPI Lines: Central line picc Foley:  Yes, and it is still needed Code Status:  full code Last date of multidisciplinary goals of care discussion: 5/2 updated at bedside  35 min cc time  Josiah Nigh, MD 02/20/24 7:38 AM [facility]  For contact information, see Amion. If no response to pager, please call PCCM consult pager. After hours, 7PM- 7AM, please call Elink.

## 2024-02-20 NOTE — Progress Notes (Signed)
 PHARMACY - ANTICOAGULATION CONSULT NOTE  Pharmacy Consult for UFH IV Infusion Indication: atrial fibrillation / LLE DVT  Allergies  Allergen Reactions   Bee Venom     Patient Measurements: Height: 6\' 1"  (185.4 cm) Weight: 116.8 kg (257 lb 8 oz) IBW/kg (Calculated) : 79.9 HEPARIN  DW (KG): 106  Vital Signs: Temp: 97.9 F (36.6 C) (05/04 0415) Temp Source: Axillary (05/04 0415) BP: 123/73 (05/04 0715) Pulse Rate: 43 (05/04 0715)  Labs: Recent Labs    02/18/24 0400 02/18/24 1521 02/19/24 0420 02/19/24 0635 02/19/24 1306 02/19/24 1704 02/20/24 0414  HGB 7.3*  --  7.2*  --   --   --  6.9*  HCT 24.6*  --  24.6*  --   --   --  23.4*  PLT 756*  --  665*  --   --   --  615*  HEPARINUNFRC 0.21*   < >  --  0.14* 0.13*  --  0.28*  CREATININE 1.30*   < > 1.16  --   --  1.26* 1.33*   < > = values in this interval not displayed.    Estimated Creatinine Clearance: 71.2 mL/min (A) (by C-G formula based on SCr of 1.33 mg/dL (H)).   Medical History: Past Medical History:  Diagnosis Date   Hypertension     Medications:   Infusions:   amiodarone  30 mg/hr (02/20/24 0700)   dexmedetomidine  (PRECEDEX ) IV infusion 0.3 mcg/kg/hr (02/20/24 0700)   feeding supplement (VITAL 1.5 CAL) 55 mL/hr at 02/20/24 0700   heparin  1,900 Units/hr (02/20/24 0700)   meropenem  (MERREM ) IV Stopped (02/20/24 0553)   micafungin  (MYCAMINE ) 150 mg in sodium chloride  0.9 % 100 mL IVPB Stopped (02/19/24 1202)   norepinephrine  (LEVOPHED ) Adult infusion 6 mcg/min (02/20/24 0700)   potassium chloride  10 mEq (02/20/24 0715)   prismasol  BGK 4/2.5 400 mL/hr at 02/18/24 1740   prismasol  BGK 4/2.5 400 mL/hr at 02/18/24 1740   prismasol  BGK 4/2.5 1,500 mL/hr at 02/19/24 0710   vancomycin      vasopressin  0.03 Units/min (02/20/24 0700)   PRN: albuterol , heparin , HYDROmorphone (DILAUDID) injection, ondansetron  (ZOFRAN ) IV, mouth rinse, oxyCODONE , polyvinyl alcohol , sodium chloride , sodium chloride   flush    Assessment: Dustin Davenport is 68 y.o. male with PMH HTN and HLD. Admitted with STEMI now with CGS. Found to have AAA requiring emergent surgical repair. Now s/p Bentall/AVR.  Pharmacy asked to start IV heparin  4/27 for afib.  LLE DVT and superficial thrombus in LUE found on doppler 4/28. Afib rate controled on amiodarone   S/p trach ~1200 today, some active oozing in RLL during procedure and around trach site - resolved.  Per RN, trach site has some slightly pink-tinged output from ETT but improved from 5/2. Will monitor closely.   Heparin  drip 1900 uts/hr with heparin  level 0.28 slightly< goal but trend up, hgb fell planning PRBC and holding CRRT  Goal of Therapy:  Heparin  level 0.3-0.5 units/ml Monitor platelets by anticoagulation protocol: Yes   Plan:  Continue Heparin  drip  rate 1900 units/hr Daily heparin  level and CBC. Monitor s/s bleeding.  [name] Pharm.D. CPP, BCPS Clinical Pharmacist [phone] 02/20/2024 7:41 AM

## 2024-02-20 NOTE — Progress Notes (Signed)
 Advanced Heart Failure Rounding Note  Cardiologist: None   Chief Complaint: STEMI> Cardiogenic shock> Now s/p aortic dissection repair + Bentall  Patient Profile   Dustin Davenport is a 68 y.o. male with HTN and HLD. Admitted with STEMI now with CGS. Found to have AAA and taken emergently to the OR. Now s/p Bentall/AVR and reimplantation of coronaries.  Significant events:    - Echo 4/15: AAA measuring 5-5.7 cm. Followed by emergent TEE 4/15 which was significant for torrential aortic regurgitation and dissection flap extending from the aortic root to the proximal ascending aorta  - CT C/A/P: Showed acute type A dissection with a 5.5 cm ascending aortic aneurysm extending into arch. Dissection goes down to infrarenal aorta and extends up into innominate and left common carotid artery - 4/16 Underwent aortic dissection repair (Bentall/AVR and reimplantation of coronaries. Intra-op course c/b coagulopathy due to Effient  (PCI of RCA earlier in the day)).  - Extubated 4/18 - Re-intubated 4/19. Bronch with diffuse mucopurulent secretions - 4/26 Afib with post conversion pause. Amiodarone  decreased.  - 4/28 Started back on  Vanc + Meropenum and Micafungin  added 4/28 per CCM. Resp Cx + Candida   - 4/28 LLE Venous Doppler +DVT (gastroc vein), LUE Venous Doppler + for superficial cephalic vein thrombosis  -5/1 CT head. No acute findings. Started on CRRT.  -No acute events overnight.   Subjective:   - Significant improvement in pressor requirement; increasingly alert today.  - CRRT stopped and lines removed due to septic shock.   Objective:    Weight Range: 116.8 kg Body mass index is 33.97 kg/m.   Vital Signs:   Temp:  [97.7 F (36.5 C)-98.8 F (37.1 C)] 97.7 F (36.5 C) (05/04 1145) Pulse Rate:  [43-114] 52 (05/04 1315) Resp:  [19-35] 22 (05/04 1315) BP: (72-164)/(33-116) 108/59 (05/04 1315) SpO2:  [86 %-100 %] 99 % (05/04 1315) FiO2 (%):  [40 %-50 %] 40 % (05/04 1202) Weight:   [116.8 kg] 116.8 kg (05/04 0630) Last BM Date : 02/20/24  Weight change: Filed Weights   02/18/24 0255 02/19/24 0500 02/20/24 0630  Weight: 128.1 kg 128 kg 116.8 kg   Intake/Output:  Intake/Output Summary (Last 24 hours) at 02/20/2024 1342 Last data filed at 02/20/2024 1300 Gross per 24 hour  Intake 4840.46 ml  Output 2558 ml  Net 2282.46 ml    Physical Exam  General:  trach in place; awake.  Neck: supple. JVP difficult to assess due to body habitus.  Cor: no murmurs.  Lungs: mechanical lung sounds Abdomen: soft, nontender, nondistended.  Extremities: no cyanosis; edema in upper extremities; mild in lower extremities Neuro: trach in place;  more alert today.    Telemetry   Atrial fibrillation; rate controlled.  Labs    CBC Recent Labs    02/19/24 0420 02/20/24 0414  WBC 26.1* 29.7*  HGB 7.2* 6.9*  HCT 24.6* 23.4*  MCV 90.8 92.5  PLT 665* 615*   Basic Metabolic Panel Recent Labs    16/10/96 0420 02/19/24 1704 02/20/24 0414  NA 133* 137 136  K 3.7 3.7 3.4*  CL 101 103 105  CO2 23 23 23   GLUCOSE 314* 258* 257*  BUN 51* 54* 69*  CREATININE 1.16 1.26* 1.33*  CALCIUM  7.5* 7.7* 7.7*  MG 2.7*  --  2.7*  PHOS 3.3 3.6 3.8   Liver Function Tests Recent Labs    02/19/24 1336 02/19/24 1704 02/20/24 0414  AST 346*  --   --  ALT 124*  --   --   ALKPHOS 108  --   --   BILITOT 4.1*  --   --   PROT 4.7*  --   --   ALBUMIN  1.5* 1.8* 1.8*   D-Dimer No results for input(s): "DDIMER" in the last 72 hours.  Hemoglobin A1C No results for input(s): "HGBA1C" in the last 72 hours.  Medications:    Scheduled Medications:  sodium chloride    Intravenous Once   acetaminophen   650 mg Per Tube Q6H   amiodarone   400 mg Per Tube BID   Chlorhexidine  Gluconate Cloth  6 each Topical Daily   clonazePAM   0.5 mg Per Tube BID   clopidogrel   75 mg Oral Daily   feeding supplement (PROSource TF20)  60 mL Per Tube QID   furosemide   80 mg Intravenous BID   Gerhardt's butt  cream   Topical Daily   insulin  aspart  0-20 Units Subcutaneous Q4H   insulin  aspart  5 Units Subcutaneous Q4H   insulin  glargine-yfgn  20 Units Subcutaneous BID   methocarbamol  (ROBAXIN ) injection  1,000 mg Intravenous Q8H   multivitamin  1 tablet Oral QHS   mouth rinse  15 mL Mouth Rinse Q2H   pantoprazole  (PROTONIX ) IV  40 mg Intravenous QHS   polyethylene glycol  17 g Oral Daily   QUEtiapine   50 mg Per Tube BID   rosuvastatin   40 mg Oral Daily   sodium chloride  flush  10-40 mL Intracatheter Q12H   tranexamic acid   500 mg Nebulization Q8H   Infusions:  dexmedetomidine  (PRECEDEX ) IV infusion 0.1 mcg/kg/hr (02/20/24 1300)   feeding supplement (VITAL 1.5 CAL) 55 mL/hr at 02/20/24 1300   heparin  1,900 Units/hr (02/20/24 1322)   meropenem  (MERREM ) IV Stopped (02/20/24 0553)   norepinephrine  (LEVOPHED ) Adult infusion 4 mcg/min (02/20/24 1300)   vancomycin  Stopped (02/20/24 1254)   vasopressin  0.03 Units/min (02/20/24 1300)   PRN Medications: albuterol , heparin , HYDROmorphone (DILAUDID) injection, ondansetron  (ZOFRAN ) IV, mouth rinse, oxyCODONE , polyvinyl alcohol , sodium chloride , sodium chloride  flush   Assessment/Plan   Septic/vasoplegic shock - Now vanc/merrem ; remains on micafungin .  - Discussed with CCM - May need repeat CT; order repeating LFTs to assess for GB etiology.  - WBC 29K today; discussed with CCM. Removed all lines today. Will hold CRRT & restart IV lasix . Levophed  requirement has improved significantly after escalating antibiotics yesterday.   Acute type A aortic dissection  - Echo 4/15: AAA measuring 5-5.7 cm - Emergent TEE 4/15: significant for torrential aortic regurgitation and dissection flap extending from the aortic root to the proximal ascending aorta  - CT C/A/P: Showed acute type A dissection with a 5.5 cm ascending aortic aneurysm extending into arch. Dissection goes down to infrarenal aorta and extends up into innominate and left common carotid arteries   - Now s/p aortic dissection repair 02/01/24 (Bentall/AVR and reimplantation of coronaries. - stable.   2. CAD, Acute inferior STEMI - Admitted with STEMI. Post PCI found to also have aortic dissection - LHC with single vessel CAD involving the mid to distal RCA, successful PCI of the RCA with overlapping DES x 3. - No aspirin  with heparin  gtt - Continue plavix  75mg  daily - Continue Crestor  40 mg daily - no changes  3. Acute systolic HF ->  Cardiogenic shock - Post perfusion CGS.  - Echo 4/19: EF 25% RV moderate to severely decreased -Significant improvement in levophed  requirement today.   4. Post-op hypoxic resp failure - extubated 4/18.  - re-intubated  4/19 - s/p tracheostomy - diffuse mucopurulent secretions on bronch 4/19. BAL GS: Mod WBC. Few gram variable rods. Rare yeast - CT C/A/P 4/27 w/ small postoperative seroma around aortic root/graft and possible PNA.  - RespCx 4/28 + Candida. Currently on micafungin  - See above; restarting broad spec abx.   5. PAF with post-termination pauses PVCs - Patient with AF intra-op. In and out of A fib.  - continue IV amio while on pressor support  - continue heparin  gtt   6. Hypokalemia/hypernatremia - On CRRT - Getting free water .    7. AKI due to ATN/shock - Baseline SCr ~1.3-1.4 - On CRRT -Per Nephrology.   8. ABLA post-op - Received 6uPRBCs, 6 plt, 4 FFP and 5 cryo intra-op.  - Hgb 7.3  today   - Transfuse Hgb < 7.0   9. Acute Venous Thrombosis  - +LLE DVT (gastroc vein) - + LUE superficial cephalic venin thrombosis - heparin  gtt until completion of invasive procedures (anticipate likely trach later in the week)   10. Encephalopathy  - slow to wake up despite being off sedation  - CT head no acute findings.    CRITICAL CARE Performed by: Alwin Baars   Total critical care time: 40 minutes  Critical care time was exclusive of separately billable procedures and treating other patients.  Critical care was  necessary to treat or prevent imminent or life-threatening deterioration.  Critical care was time spent personally by me on the following activities: development of treatment plan with patient and/or surrogate as well as nursing, discussions with consultants, evaluation of patient's response to treatment, examination of patient, obtaining history from patient or surrogate, ordering and performing treatments and interventions, ordering and review of laboratory studies, ordering and review of radiographic studies, pulse oximetry and re-evaluation of patient's condition.    Length of Stay: [address] DO  02/20/2024, 1:42 PM  Advanced Heart Failure Team Pager [phone] (M-F; 7a - 5p)  Please contact [facility] for night-coverage after hours (5p -7a ) and weekends on [email or web address]

## 2024-02-20 NOTE — Progress Notes (Signed)
 Dustin Davenport is an 68 y.o. male  HTN HLD p/w acute chest pain -> acute inferior STEMI emergently to the Cath Lab showing diffuse RCA disease treated with DES x 3.  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  Taken emergently to the OR with aortic dissection repair, AVR and reimplantation of the coronaries on February 02, 2024. EF was down to 20 to 25% with moderate to severely decreased RV as well.  BL creatinine was 1.3-1.4 but peaked at 3.2.   Assessment/Plan: Acute renal failure secondary to ischemic ATN with refractory hypernatremia despite very aggressive free water  replacement every 2 hours as well as severely volume overloaded.  Patient is net +12 L during this hospitalization by what is recorded but on exam possibly even more. Initiated CRRT for renal failure (started 5/1-5/3) + refractory hypernatremia and volume overload.    Agree with line holiday per CCM with high white counts: Do not anticipate having to restart CRRT emergently for few days.  Urine output has been very good.  Can always challenge with Lasix  as well.  He is off free water  and if there is uptrend to his serum sodium levels then will need free water  added back on.  -Monitor Daily I/Os, Daily weight    -Maintain MAP>65 for optimal renal perfusion.  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 Anemia status post multiple units of blood products including RBCs, platelets, FFP and cryo. Acute systolic heart failure followed by advanced heart failure team treated with Lasix   Subjective: Levophed  6 mcg; pain all over.  Spouse bedside updated.  Good urine output.     Chemistry and  CBC: Creatinine, Ser  Date/Time Value Ref Range Status  02/20/2024 04:14 AM 1.33 (H) 0.61 - 1.24 mg/dL Final  29/56/2130 86:57 PM 1.26 (H) 0.61 - 1.24 mg/dL Final  84/69/6295 28:41 AM 1.16 0.61 - 1.24 mg/dL Final  32/44/0102 72:53 PM 1.27 (H) 0.61 - 1.24 mg/dL Final  66/44/0347 42:59 AM 1.30 (H) 0.61 - 1.24 mg/dL Final  56/38/7564 33:29 PM 1.53 (H) 0.61 - 1.24 mg/dL Final  51/88/4166 06:30 AM 1.82 (H) 0.61 - 1.24 mg/dL Final  16/10/930 35:57 AM 1.40 (H) 0.61 - 1.24 mg/dL Final  32/20/2542 70:62 PM 1.68 (H) 0.61 - 1.24 mg/dL Final  37/62/8315 17:61 AM 1.71 (H) 0.61 - 1.24 mg/dL Final  60/73/7106 26:94 AM 1.60 (H) 0.61 - 1.24 mg/dL Final  85/46/2703 50:09 AM 1.51 (H) 0.61 - 1.24 mg/dL Final  38/18/2993 71:69 AM 1.41 (H) 0.61 - 1.24 mg/dL Final  67/89/3810 17:51 AM 1.38 (H) 0.61 - 1.24 mg/dL Final  02/58/5277 82:42 PM 1.63 (H) 0.61 - 1.24 mg/dL Final  35/36/1443 15:40 AM 1.62 (H) 0.61 - 1.24 mg/dL Final  08/67/6195 09:32 AM 1.58 (H) 0.61 - 1.24 mg/dL Final  67/09/4579 99:83 PM 1.71 (H) 0.61 - 1.24 mg/dL Final  38/25/0539 76:73 AM 1.61 (H) 0.61 - 1.24 mg/dL Final  41/93/7902 40:97 AM 1.64 (H) 0.61 - 1.24 mg/dL Final  35/32/9924 26:83 PM 1.93 (H) 0.61 - 1.24 mg/dL Final  41/96/2229 79:89 AM 2.27 (H) 0.61 - 1.24 mg/dL Final  21/19/4174 08:14 AM 2.98 (H) 0.61 - 1.24 mg/dL Final  02/06/2024 03:35 AM 2.86 (H) 0.61 - 1.24 mg/dL Final  16/07/9603 54:09 AM 3.22 (H) 0.61 - 1.24 mg/dL Final  81/19/1478 29:56 AM 3.17 (H) 0.61 - 1.24 mg/dL Final  21/30/8657 84:69 PM 3.32 (H) 0.61 - 1.24 mg/dL Final  62/95/2841 32:44 AM 3.28 (H) 0.61 - 1.24 mg/dL Final  10/21/7251 66:44 AM 2.73 (H) 0.61 - 1.24 mg/dL Final  03/47/4259 56:38 PM 2.26 (H) 0.61 - 1.24 mg/dL Final  75/64/3329 51:88 PM 2.16 (H) 0.61 - 1.24 mg/dL Final  41/66/0630 16:01 AM 1.40 (H) 0.61 - 1.24 mg/dL Final  09/32/3557 32:20 AM 1.40 (H) 0.61 - 1.24 mg/dL Final  25/42/7062 37:62 AM 1.40 (H) 0.61 - 1.24 mg/dL Final  83/15/1761 60:73 AM 1.40  (H) 0.61 - 1.24 mg/dL Final  71/03/2693 85:46 AM 1.40 (H) 0.61 - 1.24 mg/dL Final  27/12/5007 38:18 AM 1.50 (H) 0.61 - 1.24 mg/dL Final  29/93/7169 67:89 PM 1.50 (H) 0.61 - 1.24 mg/dL Final  38/07/1750 02:58 PM 1.60 (H) 0.61 - 1.24 mg/dL Final  52/77/8242 35:36 PM 1.60 (H) 0.61 - 1.24 mg/dL Final  14/43/1540 08:67 PM 1.50 (H) 0.61 - 1.24 mg/dL Final  61/95/0932 67:12 PM 1.70 (H) 0.61 - 1.24 mg/dL Final  45/80/9983 38:25 PM 1.60 (H) 0.61 - 1.24 mg/dL Final  05/39/7673 41:93 AM 1.45 (H) 0.61 - 1.24 mg/dL Final  79/11/4095 35:32 AM 1.30 (H) 0.61 - 1.24 mg/dL Final  99/24/2683 41:96 AM 1.30 (H) 0.61 - 1.24 mg/dL Final  22/29/7989 21:19 AM 0.98 0.61 - 1.24 mg/dL Final   Recent Labs  Lab 02/17/24 0503 02/17/24 1643 02/18/24 0400 02/18/24 1521 02/19/24 0420 02/19/24 1704 02/20/24 0414  NA 152* 151* 147* 142 133* 137 136  K 3.3* 3.5 3.7 3.7 3.7 3.7 3.4*  CL 113* 116* 110 106 101 103 105  CO2 29 30 27 23 23 23 23   GLUCOSE 200* 202* 180* 300* 314* 258* 257*  BUN 114* 95* 67* 58* 51* 54* 69*  CREATININE 1.82* 1.53* 1.30* 1.27* 1.16 1.26* 1.33*  CALCIUM  8.5* 8.4* 8.4* 8.2* 7.5* 7.7* 7.7*  PHOS 5.1* 4.1 3.9 3.8 3.3 3.6 3.8   Recent Labs  Lab 02/17/24 0503 02/18/24 0400 02/19/24 0420 02/20/24 0414  WBC 20.4* 18.2* 26.1* 29.7*  HGB 7.1* 7.3* 7.2* 6.9*  HCT 23.3* 24.6* 24.6* 23.4*  MCV 90.3 91.8 90.8 92.5  PLT 783* 756* 665* 615*   Liver Function Tests: Recent Labs  Lab 02/19/24 1336 02/19/24 1704 02/20/24 0414  AST 346*  --   --   ALT 124*  --   --   ALKPHOS 108  --   --   BILITOT 4.1*  --   --   PROT 4.7*  --   --   ALBUMIN  1.5* 1.8* 1.8*   No results for input(s): "LIPASE", "AMYLASE" in the last 168 hours. No results for input(s): "AMMONIA" in the last 168 hours. Cardiac Enzymes: No results for input(s): "CKTOTAL", "CKMB", "CKMBINDEX", "TROPONINI" in the last 168 hours. Iron Studies: No results for input(s): "IRON", "TIBC", "TRANSFERRIN", "FERRITIN" in the last 72  hours. PT/INR: @LABRCNTIP (inr:5)  Xrays/Other Studies: ) Results for orders placed or performed during the hospital encounter of 02/01/24 (from the past 48 hours)  Culture, Respiratory w Gram Stain     Status: None (Preliminary result)   Collection Time: 02/18/24 11:57 AM   Specimen: Bronchoalveolar Lavage; Respiratory  Result Value Ref Range   Specimen Description BRONCHIAL ALVEOLAR LAVAGE    Special Requests NONE    Gram Stain  ABUNDANT SQUAMOUS EPITHELIAL CELLS PRESENT NO WBC SEEN NO ORGANISMS SEEN    Culture      CULTURE REINCUBATED FOR BETTER GROWTH Performed at [facility], [address] [place], [place] [address]    Report Status PENDING   Glucose, capillary     Status: Abnormal   Collection Time: 02/18/24 11:57 AM  Result Value Ref Range   Glucose-Capillary 122 (H) 70 - 99 mg/dL    Comment: Glucose reference range applies only to samples taken after fasting for at least 8 hours.  Renal function panel (daily at 1600)     Status: Abnormal   Collection Time: 02/18/24  3:21 PM  Result Value Ref Range   Sodium 142 135 - 145 mmol/L   Potassium 3.7 3.5 - 5.1 mmol/L   Chloride 106 98 - 111 mmol/L   CO2 23 22 - 32 mmol/L   Glucose, Bld 300 (H) 70 - 99 mg/dL    Comment: Glucose reference range applies only to samples taken after fasting for at least 8 hours.   BUN 58 (H) 8 - 23 mg/dL   Creatinine, Ser 6.04 (H) 0.61 - 1.24 mg/dL   Calcium  8.2 (L) 8.9 - 10.3 mg/dL   Phosphorus 3.8 2.5 - 4.6 mg/dL   Albumin  2.0 (L) 3.5 - 5.0 g/dL   GFR, Estimated >54 >09 mL/min    Comment: (NOTE) Calculated using the CKD-EPI Creatinine Equation (2021)    Anion gap 13 5 - 15    Comment: Performed at [facility], [address], [place], [place] [address]  Glucose, capillary     Status: Abnormal   Collection Time: 02/18/24  5:10 PM  Result Value Ref Range   Glucose-Capillary 175 (H) 70 - 99 mg/dL    Comment: Glucose reference range applies only to samples taken after  fasting for at least 8 hours.  Glucose, capillary     Status: Abnormal   Collection Time: 02/18/24  8:18 PM  Result Value Ref Range   Glucose-Capillary 154 (H) 70 - 99 mg/dL    Comment: Glucose reference range applies only to samples taken after fasting for at least 8 hours.  Glucose, capillary     Status: Abnormal   Collection Time: 02/18/24 11:24 PM  Result Value Ref Range   Glucose-Capillary 158 (H) 70 - 99 mg/dL    Comment: Glucose reference range applies only to samples taken after fasting for at least 8 hours.  Heparin  level (unfractionated)     Status: Abnormal   Collection Time: 02/18/24 11:39 PM  Result Value Ref Range   Heparin  Unfractionated 0.13 (L) 0.30 - 0.70 IU/mL    Comment: (NOTE) The clinical reportable range upper limit is being lowered to >1.10 to align with the FDA approved guidance for the current laboratory assay.  If heparin  results are below expected values, and patient dosage has  been confirmed, suggest follow up testing of antithrombin III levels. Performed at [facility], [address] [place], [place] [address]   Glucose, capillary     Status: Abnormal   Collection Time: 02/19/24  3:36 AM  Result Value Ref Range   Glucose-Capillary 165 (H) 70 - 99 mg/dL    Comment: Glucose reference range applies only to samples taken after fasting for at least 8 hours.  CBC     Status: Abnormal   Collection Time: 02/19/24  4:20 AM  Result Value Ref Range   WBC 26.1 (H) 4.0 - 10.5 K/uL  RBC 2.71 (L) 4.22 - 5.81 MIL/uL   Hemoglobin 7.2 (L) 13.0 - 17.0 g/dL   HCT 16.1 (L) 09.6 - 04.5 %   MCV 90.8 80.0 - 100.0 fL   MCH 26.6 26.0 - 34.0 pg   MCHC 29.3 (L) 30.0 - 36.0 g/dL   RDW 40.9 (H) 81.1 - 91.4 %   Platelets 665 (H) 150 - 400 K/uL   nRBC 30.3 (H) 0.0 - 0.2 %    Comment: CONFIRMED ON SMEAR Performed at [facility], [address], [place], [place] [address]   Renal function panel (daily at 0500)     Status: Abnormal   Collection Time:  02/19/24  4:20 AM  Result Value Ref Range   Sodium 133 (L) 135 - 145 mmol/L    Comment: DELTA CHECK NOTED   Potassium 3.7 3.5 - 5.1 mmol/L   Chloride 101 98 - 111 mmol/L   CO2 23 22 - 32 mmol/L   Glucose, Bld 314 (H) 70 - 99 mg/dL    Comment: Glucose reference range applies only to samples taken after fasting for at least 8 hours.   BUN 51 (H) 8 - 23 mg/dL   Creatinine, Ser 6.21 0.61 - 1.24 mg/dL   Calcium  7.5 (L) 8.9 - 10.3 mg/dL   Phosphorus 3.3 2.5 - 4.6 mg/dL   Albumin  1.9 (L) 3.5 - 5.0 g/dL   GFR, Estimated >30 >86 mL/min    Comment: (NOTE) Calculated using the CKD-EPI Creatinine Equation (2021)    Anion gap 9 5 - 15    Comment: Performed at [facility], [address] [place], [place] [address]  Magnesium      Status: Abnormal   Collection Time: 02/19/24  4:20 AM  Result Value Ref Range   Magnesium  2.7 (H) 1.7 - 2.4 mg/dL    Comment: Performed at [facility], [address] [place], [place] [address]  Procalcitonin     Status: None   Collection Time: 02/19/24  4:20 AM  Result Value Ref Range   Procalcitonin 1.89 ng/mL    Comment:        Interpretation: PCT > 0.5 ng/mL and <= 2 ng/mL: Systemic infection (sepsis) is possible, but other conditions are known to elevate PCT as well. (NOTE)       Sepsis PCT Algorithm           Lower Respiratory Tract                                      Infection PCT Algorithm    ----------------------------     ----------------------------         PCT < 0.25 ng/mL                PCT < 0.10 ng/mL          Strongly encourage             Strongly discourage   discontinuation of antibiotics    initiation of antibiotics    ----------------------------     -----------------------------       PCT 0.25 - 0.50 ng/mL            PCT 0.10 - 0.25 ng/mL               OR       >80% decrease in PCT  Discourage initiation of                                            antibiotics      Encourage discontinuation           of  antibiotics    ----------------------------     -----------------------------         PCT >= 0.50 ng/mL              PCT 0.26 - 0.50 ng/mL                AND       <80% decrease in PCT             Encourage initiation of                                             antibiotics       Encourage continuation           of antibiotics    ----------------------------     -----------------------------        PCT >= 0.50 ng/mL                  PCT > 0.50 ng/mL               AND         increase in PCT                  Strongly encourage                                      initiation of antibiotics    Strongly encourage escalation           of antibiotics                                     -----------------------------                                           PCT <= 0.25 ng/mL                                                 OR                                        > 80% decrease in PCT                                      Discontinue / Do not initiate  antibiotics  Performed at [facility], [address] [place], [place] [address]   Cooxemetry Panel (carboxy, met, total hgb, O2 sat)     Status: Abnormal   Collection Time: 02/19/24  6:35 AM  Result Value Ref Range   Total hemoglobin 8.0 (L) 12.0 - 16.0 g/dL   O2 Saturation 60.4 %   Carboxyhemoglobin 1.6 (H) 0.5 - 1.5 %   Methemoglobin <0.7 0.0 - 1.5 %    Comment: Performed at [facility], [address] [place], [place] [address]  Heparin  level (unfractionated)     Status: Abnormal   Collection Time: 02/19/24  6:35 AM  Result Value Ref Range   Heparin  Unfractionated 0.14 (L) 0.30 - 0.70 IU/mL    Comment: (NOTE) The clinical reportable range upper limit is being lowered to >1.10 to align with the FDA approved guidance for the current laboratory assay.  If heparin  results are below expected values, and patient dosage has  been confirmed, suggest follow up testing of  antithrombin III levels. Performed at [facility], [address], [place], [place] [address]   Glucose, capillary     Status: Abnormal   Collection Time: 02/19/24  8:34 AM  Result Value Ref Range   Glucose-Capillary 164 (H) 70 - 99 mg/dL    Comment: Glucose reference range applies only to samples taken after fasting for at least 8 hours.  Glucose, capillary     Status: Abnormal   Collection Time: 02/19/24 11:40 AM  Result Value Ref Range   Glucose-Capillary 150 (H) 70 - 99 mg/dL    Comment: Glucose reference range applies only to samples taken after fasting for at least 8 hours.  CG4 I-STAT (Lactic acid)     Status: None   Collection Time: 02/19/24  1:00 PM  Result Value Ref Range   Lactic Acid, Venous 1.5 0.5 - 1.9 mmol/L  Heparin  level (unfractionated)     Status: Abnormal   Collection Time: 02/19/24  1:06 PM  Result Value Ref Range   Heparin  Unfractionated 0.13 (L) 0.30 - 0.70 IU/mL    Comment: (NOTE) The clinical reportable range upper limit is being lowered to >1.10 to align with the FDA approved guidance for the current laboratory assay.  If heparin  results are below expected values, and patient dosage has  been confirmed, suggest follow up testing of antithrombin III levels. Performed at [facility], [address] [place], [place] [address]   Hepatic function panel     Status: Abnormal   Collection Time: 02/19/24  1:36 PM  Result Value Ref Range   Total Protein 4.7 (L) 6.5 - 8.1 g/dL   Albumin  1.5 (L) 3.5 - 5.0 g/dL   AST 621 (H) 15 - 41 U/L   ALT 124 (H) 0 - 44 U/L   Alkaline Phosphatase 108 38 - 126 U/L   Total Bilirubin 4.1 (H) 0.0 - 1.2 mg/dL   Bilirubin, Direct 0.4 (H) 0.0 - 0.2 mg/dL   Indirect Bilirubin 3.7 (H) 0.3 - 0.9 mg/dL    Comment: Performed at [facility], [address], [place], [place] [address]  Glucose, capillary     Status: Abnormal   Collection Time: 02/19/24  4:16 PM  Result Value Ref Range   Glucose-Capillary 170  (H) 70 - 99 mg/dL    Comment: Glucose reference range applies only to samples taken after fasting for at least 8 hours.  Renal function panel (daily at 1600)  Status: Abnormal   Collection Time: 02/19/24  5:04 PM  Result Value Ref Range   Sodium 137 135 - 145 mmol/L   Potassium 3.7 3.5 - 5.1 mmol/L   Chloride 103 98 - 111 mmol/L   CO2 23 22 - 32 mmol/L   Glucose, Bld 258 (H) 70 - 99 mg/dL    Comment: Glucose reference range applies only to samples taken after fasting for at least 8 hours.   BUN 54 (H) 8 - 23 mg/dL   Creatinine, Ser 3.08 (H) 0.61 - 1.24 mg/dL   Calcium  7.7 (L) 8.9 - 10.3 mg/dL   Phosphorus 3.6 2.5 - 4.6 mg/dL   Albumin  1.8 (L) 3.5 - 5.0 g/dL   GFR, Estimated >65 >78 mL/min    Comment: (NOTE) Calculated using the CKD-EPI Creatinine Equation (2021)    Anion gap 11 5 - 15    Comment: Performed at [facility], [address], [place], [place] [address]  Glucose, capillary     Status: Abnormal   Collection Time: 02/19/24  7:38 PM  Result Value Ref Range   Glucose-Capillary 255 (H) 70 - 99 mg/dL    Comment: Glucose reference range applies only to samples taken after fasting for at least 8 hours.  Glucose, capillary     Status: Abnormal   Collection Time: 02/19/24 11:33 PM  Result Value Ref Range   Glucose-Capillary 224 (H) 70 - 99 mg/dL    Comment: Glucose reference range applies only to samples taken after fasting for at least 8 hours.  Glucose, capillary     Status: Abnormal   Collection Time: 02/20/24  4:09 AM  Result Value Ref Range   Glucose-Capillary 225 (H) 70 - 99 mg/dL    Comment: Glucose reference range applies only to samples taken after fasting for at least 8 hours.  Renal function panel     Status: Abnormal   Collection Time: 02/20/24  4:14 AM  Result Value Ref Range   Sodium 136 135 - 145 mmol/L   Potassium 3.4 (L) 3.5 - 5.1 mmol/L   Chloride 105 98 - 111 mmol/L   CO2 23 22 - 32 mmol/L   Glucose, Bld 257 (H) 70 - 99 mg/dL    Comment:  Glucose reference range applies only to samples taken after fasting for at least 8 hours.   BUN 69 (H) 8 - 23 mg/dL   Creatinine, Ser 9.52 (H) 0.61 - 1.24 mg/dL   Calcium  7.7 (L) 8.9 - 10.3 mg/dL   Phosphorus 3.8 2.5 - 4.6 mg/dL   Albumin  1.8 (L) 3.5 - 5.0 g/dL   GFR, Estimated 58 (L) >60 mL/min    Comment: (NOTE) Calculated using the CKD-EPI Creatinine Equation (2021)    Anion gap 8 5 - 15    Comment: Performed at [facility], [address], [place], [place] [address]  CBC     Status: Abnormal   Collection Time: 02/20/24  4:14 AM  Result Value Ref Range   WBC 29.7 (H) 4.0 - 10.5 K/uL    Comment: REPEATED TO VERIFY   RBC 2.53 (L) 4.22 - 5.81 MIL/uL   Hemoglobin 6.9 (LL) 13.0 - 17.0 g/dL    Comment: REPEATED TO VERIFY THIS CRITICAL RESULT HAS VERIFIED AND BEEN CALLED TO J.REYES RN BY GLENDA GANADEN ON 05 04 2025 AT 0444, AND HAS BEEN READ BACK.     HCT 23.4 (L) 39.0 - 52.0 %   MCV 92.5 80.0 - 100.0 fL  MCH 27.3 26.0 - 34.0 pg   MCHC 29.5 (L) 30.0 - 36.0 g/dL   RDW 16.1 (H) 09.6 - 04.5 %   Platelets 615 (H) 150 - 400 K/uL    Comment: REPEATED TO VERIFY   nRBC 29.6 (H) 0.0 - 0.2 %    Comment: Performed at [facility], [address] [place], [place] [address]  Magnesium      Status: Abnormal   Collection Time: 02/20/24  4:14 AM  Result Value Ref Range   Magnesium  2.7 (H) 1.7 - 2.4 mg/dL    Comment: Performed at [facility], [address], [place], [place] [address]  Heparin  level (unfractionated)     Status: Abnormal   Collection Time: 02/20/24  4:14 AM  Result Value Ref Range   Heparin  Unfractionated 0.28 (L) 0.30 - 0.70 IU/mL    Comment: (NOTE) The clinical reportable range upper limit is being lowered to >1.10 to align with the FDA approved guidance for the current laboratory assay.  If heparin  results are below expected values, and patient dosage has  been confirmed, suggest follow up testing of antithrombin III levels. Performed at [facility], [address], [place], [place] [address]   Prepare RBC (crossmatch)     Status: None   Collection Time: 02/20/24  5:20 AM  Result Value Ref Range   Order Confirmation      ORDER PROCESSED BY BLOOD BANK Performed at [facility], [address], [place], [place] [address]   Type and screen [facility]     Status: None (Preliminary result)   Collection Time: 02/20/24  5:28 AM  Result Value Ref Range   ABO/RH(D) B NEG    Antibody Screen NEG    Sample Expiration 02/23/2024,2359    Unit Number [id number]    Blood Component Type RED CELLS,LR    Unit division 00    Status of Unit REL FROM [facility]    Transfusion Status OK TO TRANSFUSE    Crossmatch Result NOT NEEDED    Unit Number [id number]    Blood Component Type RED CELLS,LR    Unit division 00    Status of Unit ISSUED    Transfusion Status OK TO TRANSFUSE    Crossmatch Result COMPATIBLE   Cooxemetry Panel (carboxy, met, total hgb, O2 sat)     Status: Abnormal   Collection Time: 02/20/24  5:29 AM  Result Value Ref Range   Total hemoglobin <6.9 (LL) 12.0 - 16.0 g/dL    Comment: CRITICAL RESULT CALLED TO, READ BACK BY AND VERIFIED WITH: GRAHAM BLUST RN [id number] [phone] BY A [place]    O2 Saturation 66.6 %   Carboxyhemoglobin 1.1 0.5 - 1.5 %   Methemoglobin <0.7 0.0 - 1.5 %    Comment: Performed at [facility], [address], [place], [place] [address]  Glucose, capillary     Status: Abnormal   Collection Time: 02/20/24  8:53 AM  Result Value Ref Range   Glucose-Capillary 242 (H) 70 - 99 mg/dL    Comment: Glucose reference range applies only to samples taken after fasting for at least 8 hours.   DG Chest Port 1 View Result Date: 02/19/2024 CLINICAL DATA:  Pneumonia. EXAM: PORTABLE CHEST 1 VIEW COMPARISON:  Radiograph yesterday.  CT 02/13/2024 FINDINGS: Tracheostomy tube tip remains at the thoracic inlet. Right internal jugular central line, right upper extremity PICC and weighted  enteric tube remain in place. Post median sternotomy  with stable cardiomegaly. Prosthetic aortic valve. Worsening retrocardiac opacity and possible effusion. Ill-defined opacity at the right lung base. No pneumothorax. IMPRESSION: 1. Worsening retrocardiac opacity and possible effusion. 2. Ill-defined opacity at the right lung base, may represent atelectasis or pneumonia. 3. Stable cardiomegaly. 4. Stable support apparatus. Electronically Signed   By: Chadwick Colonel M.D.   On: 02/19/2024 10:11   DG Chest Port 1 View Result Date: 02/18/2024 CLINICAL DATA:  Status post tracheostomy. EXAM: PORTABLE CHEST 1 VIEW COMPARISON:  Feb 17, 2024. FINDINGS: Stable cardiomegaly. Tracheostomy tube is in grossly good position. Feeding tube is seen entering stomach. Status post aortic valve repair. Lungs are clear. Bony thorax is unremarkable. Right internal jugular catheter is unchanged. IMPRESSION: Tracheostomy tube in grossly good position. Otherwise stable support apparatus. Electronically Signed   By: Rosalene Colon M.D.   On: 02/18/2024 12:48    PMH:   Past Medical History:  Diagnosis Date   Hypertension     PSH:   Past Surgical History:  Procedure Laterality Date   BENTALL PROCEDURE N/A 02/01/2024   Procedure: BENTALL PROCEDURE USING A KONECT RESILIA AORTIC VALVE CONDUIT;  Surgeon: Bartley Lightning, MD;  Location: [facility] OR;  Service: Open Heart Surgery;  Laterality: N/A;   CORONARY/GRAFT ACUTE MI REVASCULARIZATION N/A 02/01/2024   Procedure: Coronary/Graft Acute MI Revascularization;  Surgeon: Swazil[place], Peter M, MD;  Location: [facility] INVASIVE CV LAB;  Service: Cardiovascular;  Laterality: N/A;   INTRAOPERATIVE TRANSESOPHAGEAL ECHOCARDIOGRAM N/A 02/01/2024   Procedure: ECHOCARDIOGRAM, TRANSESOPHAGEAL, INTRAOPERATIVE;  Surgeon: Bartley Lightning, MD;  Location: [facility] OR;  Service: Open Heart Surgery;  Laterality: N/A;   RIGHT HEART CATH N/A 02/11/2024   Procedure: RIGHT HEART CATH;  Surgeon: [name], MD;   Location: [facility] INVASIVE CV LAB;  Service: Cardiovascular;  Laterality: N/A;   THORACIC AORTIC ANEURYSM REPAIR N/A 02/01/2024   Procedure: REPAIR OF ASCENDING AORTIC ANEURYSM USING A [id number] HEMASHIELD PLATINUM GRAFT.;  Surgeon: Bartley Lightning, MD;  Location: [facility] OR;  Service: Open Heart Surgery;  Laterality: N/A;  Repair of Aortic Dissection, Right Axillary Cannulation.    Allergies:  Allergies  Allergen Reactions   Bee Venom     Medications:   Prior to Admission medications   Medication Sig Start Date End Date Taking? Authorizing Provider  acetaminophen  (TYLENOL ) 500 MG tablet Take 1,000 mg by mouth 2 (two) times daily as needed for mild pain (pain score 1-3), moderate pain (pain score 4-6), fever or headache.   Yes [provider]  chlorthalidone (HYGROTON) 25 MG tablet Take 25 mg by mouth daily.   Yes [provider]  losartan (COZAAR) 100 MG tablet Take 50 mg by mouth daily. 11/22/23  Yes [provider]  metoprolol  succinate (TOPROL -XL) 100 MG 24 hr tablet Take 50 mg by mouth daily. Take with or immediately following a meal.   Yes [provider]  potassium chloride  (KLOR-CON ) 10 MEQ tablet Take 10 mEq by mouth daily. 01/17/24  Yes [provider]    Discontinued Meds:   Medications Discontinued During This Encounter  Medication Reason   iohexol  (OMNIPAQUE ) 350 MG/ML injection Patient Discharge   norepinephrine  (LEVOPHED ) 4mg  in (0.016 mg/mL) premix infusion Patient Discharge   prasugrel  (EFFIENT ) tablet Patient Discharge   nitroGLYCERIN  1 mg/10 mL (100 mcg/mL) - IR/CATH LAB Patient Discharge   heparin  sodium (porcine) injection Patient Discharge   midazolam  (VERSED ) injection Patient Discharge   fentaNYL  (SUBLIMAZE ) injection Patient Discharge   Radial Cocktail/Verapamil  only Patient Discharge  Heparin  (Porcine) in NaCl 1000-0.9 UT/500ML-% SOLN Patient Discharge   lidocaine  (PF) (XYLOCAINE ) 1 % injection Patient Discharge    losartan (COZAAR) 50 MG tablet Dose change   acetaminophen  (TYLENOL ) tablet 650 mg    ondansetron  (ZOFRAN ) injection 4 mg    enoxaparin  (LOVENOX ) injection 40 mg    aspirin  EC 81 MG tablet Discontinued by provider   bismuth subsalicylate (PEPTO BISMOL) 262 MG/15ML suspension Patient has not taken in last 30 days   glucosamine-chondroitin 500-400 MG tablet Patient Preference   pantoprazole  (PROTONIX ) 20 MG tablet Completed Course   potassium chloride  (K-DUR,KLOR-CON ) 10 MEQ tablet Duplicate   propofol  (DIPRIVAN ) 10 mg/mL bolus/IV push 60.1 mg    prasugrel  (EFFIENT ) tablet 10 mg    coagulation factor VIIa  recomb (NOVOSEVEN ) injection 5,000 mcg    heparin  sodium (porcine) 2,500 Units, papaverine 30 mg in electrolyte-A (PLASMALYTE-A PH 7.4) 500 mL irrigation Patient Discharge   hemostatic agents (no charge) Optime Patient Discharge   thrombin  recombinant (RECOTHROM ) solution syringe Patient Discharge   Surgifoam 1 Gm with Thrombin  20,000 units (20 ml) topical solution Patient Discharge   0.9 % irrigation (POUR BTL) Patient Discharge   milrinone  (PRIMACOR ) 20 MG/100 ML (0.2 mg/mL) infusion Patient Transfer   nitroGLYCERIN  50 mg in dextrose  5 % 250 mL (0.2 mg/mL) infusion Patient Transfer   phenylephrine  (NEO-SYNEPHRINE) 20mg /NS premix infusion Patient Transfer   potassium chloride  injection 80 mEq Patient Transfer   heparin  30,000 units/NS 1000 mL solution for CELLSAVER Patient Transfer   heparin  sodium (porcine) 2,500 Units, papaverine 30 mg in electrolyte-A (PLASMALYTE-A PH 7.4) 500 mL irrigation Patient Transfer   tranexamic acid  (CYKLOKAPRON ) pump prime solution 240 mg Patient Transfer   ceFAZolin  (ANCEF ) IVPB 2g/100 mL premix Patient Transfer   [facility] Blood Cardioplegia vial (lidocaine /magnesium /mannitol  0.26g-4g-6.4g) Patient Transfer   tranexamic acid  (CYKLOKAPRON ) 2,500 mg in sodium chloride  0.9 % 250 mL (10 mg/mL) infusion Patient Transfer   vasopressin  (PITRESSIN) 20  Units in 100 mL (0.2 unit/mL) infusion-*FOR SHOCK* Patient Transfer   0.9 %  sodium chloride  infusion Patient Transfer   pantoprazole  (PROTONIX ) EC tablet 20 mg    nitroGLYCERIN  (NITROSTAT ) SL tablet 0.4 mg    ondansetron  (ZOFRAN ) injection 4 mg    acetaminophen  (TYLENOL ) tablet 650 mg    enoxaparin  (LOVENOX ) injection 40 mg    sodium chloride  flush (NS) 0.9 % injection 3 mL    sodium chloride  flush (NS) 0.9 % injection 3 mL    0.9 %  sodium chloride  infusion    aspirin  chewable tablet 81 mg    potassium chloride  SA (KLOR-CON  M) CR tablet 20 mEq    magnesium  sulfate IVPB 2 g 50 mL    0.9 %  sodium chloride  infusion    norepinephrine  (LEVOPHED ) 4mg  in (0.016 mg/mL) premix infusion    0.9 %  sodium chloride  infusion (Manually program via Guardrails IV Fluids)    0.9 %  sodium chloride  infusion (Manually program via Guardrails IV Fluids)    0.9 %  sodium chloride  infusion (Manually program via Guardrails IV Fluids)    potassium chloride  10 mEq in 50 mL *CENTRAL LINE* IVPB    metoprolol  tartrate (LOPRESSOR ) tablet 12.5 mg    metoprolol  tartrate (LOPRESSOR ) 25 mg/10 mL oral suspension 12.5 mg    norepinephrine  (LEVOPHED ) 4mg  in (0.016 mg/mL) premix infusion    potassium chloride  10 mEq in 50 mL *CENTRAL LINE* IVPB    aspirin  EC tablet 325 mg    aspirin  chewable tablet 324 mg  sodium bicarbonate  150 mEq in sterile water  1,150 mL infusion    metoprolol  tartrate (LOPRESSOR ) injection 2.5-5 mg    free water  200 mL    docusate sodium  (COLACE) capsule 200 mg    morphine  (PF) 2 MG/ML injection 1-4 mg    pantoprazole  (PROTONIX ) injection 40 mg    pantoprazole  (PROTONIX ) EC tablet 40 mg    traMADol  (ULTRAM ) tablet 50-100 mg    atorvastatin  (LIPITOR) tablet 80 mg    fentaNYL  in NS (42mcg/ml) infusion-PREMIX    nitroGLYCERIN  50 mg in dextrose  5 % 250 mL (0.2 mg/mL) infusion    albumin  human 5 % solution 12.5 g    insulin  regular, human (MYXREDLIN ) 100 units/ 100 mL  infusion    dextrose  50 % solution 0-50 mL    furosemide  (LASIX ) 120 mg in dextrose  5 % 50 mL IVPB    metolazone  (ZAROXOLYN ) tablet 5 mg Change in therapy   bisacodyl  (DULCOLAX) EC tablet 10 mg    bisacodyl  (DULCOLAX) suppository 10 mg    senna-docusate (Senokot-S) tablet 2 tablet    oxyCODONE  (Oxy IR/ROXICODONE ) immediate release tablet 5-10 mg    midazolam  (VERSED ) injection 2 mg    fentaNYL  in NS (61mcg/ml) infusion-PREMIX    aspirin  tablet 325 mg    acetaminophen  (TYLENOL ) tablet 1,000 mg    acetaminophen  (TYLENOL ) 160 MG/5ML solution 1,000 mg    fentaNYL  (SUBLIMAZE ) bolus via infusion 50-100 mcg    dexmedetomidine  (PRECEDEX ) 400 MCG/100ML (4 mcg/mL) infusion    fentaNYL  (SUBLIMAZE ) injection 12.5-50 mcg    methocarbamol  (ROBAXIN ) injection 500 mg    furosemide  (LASIX ) 120 mg in dextrose  5 % 50 mL IVPB    insulin  glargine-yfgn (SEMGLEE ) injection 25 Units    fentaNYL  (SUBLIMAZE ) injection 50-100 mcg    fentaNYL  (SUBLIMAZE ) injection 25 mcg    ketamine  HCl 50 MG/5ML SOSY Returned to ADS   succinylcholine  (ANECTINE ) 200 MG/10ML syringe Returned to ADS   insulin  glargine-yfgn (SEMGLEE ) injection 25 Units    insulin  aspart (novoLOG ) injection 0-20 Units    0.9 %  sodium chloride  infusion (Manually program via Guardrails IV Fluids)    potassium chloride  10 mEq in 100 mL IVPB    vasopressin  (PITRESSIN) 20 Units in 100 mL (0.2 unit/mL) infusion-*FOR SHOCK*    EPINEPHrine  (ADRENALIN ) 5 mg in NS 250 mL (0.02 mg/mL) premix infusion    norepinephrine  (LEVOPHED ) 16 mg in 250mL (0.064 mg/mL) premix infusion    metoprolol  tartrate (LOPRESSOR ) injection 2.5-5 mg    aspirin  suppository 300 mg    potassium chloride  10 mEq in 50 mL *CENTRAL LINE* IVPB    sodium chloride  flush (NS) 0.9 % injection 3 mL    sodium chloride  flush (NS) 0.9 % injection 3 mL    sodium chloride  flush (NS) 0.9 % injection 3-10 mL    sodium chloride  flush (NS) 0.9 % injection 3-10 mL    sodium chloride   flush (NS) 0.9 % injection 10-40 mL    sodium chloride  flush (NS) 0.9 % injection 10-40 mL    meropenem  (MERREM ) 1 g in sodium chloride  0.9 % 100 mL IVPB    vancomycin  variable dose per unstable renal function (pharmacist dosing)    lactulose  (CHRONULAC ) 10 GM/15ML solution 20 g    furosemide  (LASIX ) injection 60 mg    feeding supplement (PROSource TF20) liquid 60 mL    lactulose  (CHRONULAC ) 10 GM/15ML solution 20 g    bisacodyl  (DULCOLAX) suppository 10 mg    potassium chloride  (KLOR-CON )  packet 40 mEq    free water  100 mL    piperacillin -tazobactam (ZOSYN ) IVPB 3.375 g Discontinued by provider   dexmedetomidine  (PRECEDEX ) 400 MCG/100ML (4 mcg/mL) infusion    acetaminophen  (TYLENOL ) 160 MG/5ML solution 650 mg    free water  100 mL    insulin  aspart (novoLOG ) injection 0-9 Units    clonazePAM  (KLONOPIN ) tablet 1 mg    clonazePAM  (KLONOPIN ) tablet 1 mg    amiodarone  (NEXTERONE  PREMIX) 360-4.14 MG/200ML-% (1.8 mg/mL) IV infusion    amiodarone  (PACERONE ) tablet 400 mg    amiodarone  (PACERONE ) tablet 400 mg    senna-docusate (Senokot-S) tablet 1 tablet    EPINEPHrine  (ADRENALIN ) 5 mg in NS 250 mL (0.02 mg/mL) premix infusion    magnesium  sulfate IVPB 2 g 50 mL    midazolam  (VERSED ) injection 1-2 mg    Heparin  (Porcine) in NaCl 1000-0.9 UT/500ML-% SOLN Patient Discharge   lidocaine  (PF) (XYLOCAINE ) 1 % injection Patient Discharge   0.9 %  sodium chloride  infusion Patient Transfer   aspirin  chewable tablet 81 mg    clopidogrel  (PLAVIX ) tablet 75 mg    atorvastatin  (LIPITOR) tablet 80 mg    dexmedetomidine  (PRECEDEX ) 400 MCG/100ML (4 mcg/mL) infusion    fentaNYL  in NS (67mcg/ml) infusion-PREMIX    QUEtiapine  (SEROQUEL ) tablet 50 mg    atorvastatin  (LIPITOR) tablet 80 mg    norepinephrine  (LEVOPHED ) 4mg  in (0.016 mg/mL) premix infusion    aspirin  chewable tablet 81 mg    insulin  glargine-yfgn (SEMGLEE ) injection 5 Units    potassium chloride  (KLOR-CON ) packet 20 mEq     norepinephrine  (LEVOPHED ) 4mg  in (0.016 mg/mL) premix infusion    fentaNYL  (SUBLIMAZE ) bolus via infusion 100 mcg    feeding supplement (VITAL 1.5 CAL) liquid 1,000 mL    feeding supplement (PROSource TF20) liquid 60 mL    vancomycin  (VANCOREADY) IVPB 1750 mg/350 mL    methocarbamol  (ROBAXIN ) injection 500 mg    polyethylene glycol (MIRALAX  / GLYCOLAX ) packet 17 g    free water  200 mL    insulin  aspart (novoLOG ) injection 2 Units    furosemide  (LASIX ) injection 80 mg    metoCLOPramide  (REGLAN ) injection 5 mg    furosemide  (LASIX ) injection 80 mg    polyethylene glycol (MIRALAX  / GLYCOLAX ) packet 17 g    clonazePAM  (KLONOPIN ) tablet 1 mg    QUEtiapine  (SEROQUEL ) tablet 100 mg    oxyCODONE  (Oxy IR/ROXICODONE ) immediate release tablet 5 mg    free water  200 mL    meropenem  (MERREM ) 1 g in sodium chloride  0.9 % 100 mL IVPB    meropenem  (MERREM ) 15 g in sodium chloride  0.9 % 100 mL IVPB    clonazePAM  (KLONOPIN ) tablet 0.5 mg    oxyCODONE  (Oxy IR/ROXICODONE ) immediate release tablet 5 mg    QUEtiapine  (SEROQUEL ) tablet 50 mg    metoCLOPramide  (REGLAN ) injection 5 mg    fentaNYL  (SUBLIMAZE ) bolus via infusion 25-100 mcg    vancomycin  (VANCOREADY) IVPB 1500 mg/300 mL    meropenem  (MERREM ) 1 g in sodium chloride  0.9 % 100 mL IVPB    micafungin  (MYCAMINE ) 100 mg in sodium chloride  0.9 % 100 mL IVPB    clopidogrel  (PLAVIX ) tablet 75 mg    rosuvastatin  (CRESTOR ) tablet 40 mg    polyethylene glycol (MIRALAX  / GLYCOLAX ) packet 17 g    Gerhardt's butt cream    free water  300 mL    metoCLOPramide  (REGLAN ) injection 5 mg    free water  200 mL    insulin   glargine-yfgn (SEMGLEE ) injection 12 Units    succinylcholine  (ANECTINE ) 200 MG/10ML syringe Returned to ADS   fentaNYL  (SUBLIMAZE ) 50 MCG/ML injection Returned to ADS   ketamine  HCl 50 MG/5ML SOSY Returned to ADS   heparin  ADULT infusion 100 units/mL (25000 units/250mL)    fentaNYL  (SUBLIMAZE ) injection 25-100 mcg    propofol   (DIPRIVAN ) 1000 MG/100ML infusion    free water  200 mL    midodrine (PROAMATINE) tablet 10 mg Change in therapy   HYDROmorphone (DILAUDID) injection 2-4 mg    insulin  glargine-yfgn (SEMGLEE ) injection 12 Units     Social History:  reports that he has never smoked. He does not have any smokeless tobacco history on file. He reports that he does not currently use alcohol . He reports current drug use. Drug: Amphetamines.  Family History:  No family history on file.  Blood pressure 128/66, pulse 62, temperature 98.1 F (36.7 C), temperature source Axillary, resp. rate (!) 32, height 6\' 1"  (1.854 m), weight 116.8 kg, SpO2 97%. Physical Exam: General appearance: intubated Head: NCAT Back: negative Resp: Mechanical breath sounds Cardio: RRR GI: soft, obese + BS Extremities: edema 2+ all four ext Pulses: 2+ and symmetric Access: Right IJ temp     Jordanny Waddington, Alveda Aures, MD 02/20/2024, 9:05 AM

## 2024-02-21 ENCOUNTER — Inpatient Hospital Stay (HOSPITAL_COMMUNITY)

## 2024-02-21 ENCOUNTER — Other Ambulatory Visit (HOSPITAL_COMMUNITY)

## 2024-02-21 DIAGNOSIS — I480 Paroxysmal atrial fibrillation: Secondary | ICD-10-CM | POA: Diagnosis not present

## 2024-02-21 DIAGNOSIS — I5020 Unspecified systolic (congestive) heart failure: Secondary | ICD-10-CM

## 2024-02-21 DIAGNOSIS — R609 Edema, unspecified: Secondary | ICD-10-CM

## 2024-02-21 DIAGNOSIS — E876 Hypokalemia: Secondary | ICD-10-CM | POA: Diagnosis not present

## 2024-02-21 DIAGNOSIS — J9601 Acute respiratory failure with hypoxia: Secondary | ICD-10-CM | POA: Diagnosis not present

## 2024-02-21 DIAGNOSIS — I5021 Acute systolic (congestive) heart failure: Secondary | ICD-10-CM | POA: Diagnosis not present

## 2024-02-21 DIAGNOSIS — I7101 Dissection of ascending aorta: Secondary | ICD-10-CM | POA: Diagnosis not present

## 2024-02-21 DIAGNOSIS — I2111 ST elevation (STEMI) myocardial infarction involving right coronary artery: Secondary | ICD-10-CM | POA: Diagnosis not present

## 2024-02-21 LAB — MAGNESIUM: Magnesium: 2.8 mg/dL — ABNORMAL HIGH (ref 1.7–2.4)

## 2024-02-21 LAB — CBC
HCT: 26.4 % — ABNORMAL LOW (ref 39.0–52.0)
Hemoglobin: 8 g/dL — ABNORMAL LOW (ref 13.0–17.0)
MCH: 27.7 pg (ref 26.0–34.0)
MCHC: 30.3 g/dL (ref 30.0–36.0)
MCV: 91.3 fL (ref 80.0–100.0)
Platelets: 612 10*3/uL — ABNORMAL HIGH (ref 150–400)
RBC: 2.89 MIL/uL — ABNORMAL LOW (ref 4.22–5.81)
RDW: 20 % — ABNORMAL HIGH (ref 11.5–15.5)
WBC: 27.1 10*3/uL — ABNORMAL HIGH (ref 4.0–10.5)
nRBC: 35.1 % — ABNORMAL HIGH (ref 0.0–0.2)

## 2024-02-21 LAB — BPAM RBC
Blood Product Expiration Date: 202505282359
Blood Product Expiration Date: 202506072359
ISSUE DATE / TIME: 202505040852
Unit Type and Rh: 1700
Unit Type and Rh: 7300

## 2024-02-21 LAB — RENAL FUNCTION PANEL
Albumin: 1.9 g/dL — ABNORMAL LOW (ref 3.5–5.0)
Anion gap: 9 (ref 5–15)
BUN: 97 mg/dL — ABNORMAL HIGH (ref 8–23)
CO2: 22 mmol/L (ref 22–32)
Calcium: 8.2 mg/dL — ABNORMAL LOW (ref 8.9–10.3)
Chloride: 108 mmol/L (ref 98–111)
Creatinine, Ser: 1.64 mg/dL — ABNORMAL HIGH (ref 0.61–1.24)
GFR, Estimated: 45 mL/min — ABNORMAL LOW (ref >60)
Glucose, Bld: 179 mg/dL — ABNORMAL HIGH (ref 70–99)
Phosphorus: 6 mg/dL — ABNORMAL HIGH (ref 2.5–4.6)
Potassium: 3.6 mmol/L (ref 3.5–5.1)
Sodium: 139 mmol/L (ref 135–145)

## 2024-02-21 LAB — ECHOCARDIOGRAM COMPLETE
AR max vel: 2.24 cm2
AV Peak grad: 11.7 mmHg
Ao pk vel: 1.71 m/s
Area-P 1/2: 4.21 cm2
Est EF: 40
Height: 73 in
S' Lateral: 3.3 cm
Weight: 4169.34 [oz_av]

## 2024-02-21 LAB — TYPE AND SCREEN
ABO/RH(D): B NEG
Antibody Screen: NEGATIVE
Unit division: 0
Unit division: 0

## 2024-02-21 LAB — GLUCOSE, CAPILLARY
Glucose-Capillary: 114 mg/dL — ABNORMAL HIGH (ref 70–99)
Glucose-Capillary: 120 mg/dL — ABNORMAL HIGH (ref 70–99)
Glucose-Capillary: 132 mg/dL — ABNORMAL HIGH (ref 70–99)
Glucose-Capillary: 146 mg/dL — ABNORMAL HIGH (ref 70–99)
Glucose-Capillary: 152 mg/dL — ABNORMAL HIGH (ref 70–99)
Glucose-Capillary: 165 mg/dL — ABNORMAL HIGH (ref 70–99)
Glucose-Capillary: 173 mg/dL — ABNORMAL HIGH (ref 70–99)

## 2024-02-21 LAB — BASIC METABOLIC PANEL WITH GFR
Anion gap: 19 — ABNORMAL HIGH (ref 5–15)
BUN: 90 mg/dL — ABNORMAL HIGH (ref 8–23)
CO2: 20 mmol/L — ABNORMAL LOW (ref 22–32)
Calcium: 8 mg/dL — ABNORMAL LOW (ref 8.9–10.3)
Chloride: 104 mmol/L (ref 98–111)
Creatinine, Ser: 1.54 mg/dL — ABNORMAL HIGH (ref 0.61–1.24)
GFR, Estimated: 49 mL/min — ABNORMAL LOW (ref >60)
Glucose, Bld: 130 mg/dL — ABNORMAL HIGH (ref 70–99)
Potassium: 3.5 mmol/L (ref 3.5–5.1)
Sodium: 143 mmol/L (ref 135–145)

## 2024-02-21 LAB — COOXEMETRY PANEL
Carboxyhemoglobin: 1.5 % (ref 0.5–1.5)
Methemoglobin: <0.7 % (ref 0.0–1.5)
O2 Saturation: 68.1 %
Total hemoglobin: 8.2 g/dL — ABNORMAL LOW (ref 12.0–16.0)

## 2024-02-21 LAB — HEPATIC FUNCTION PANEL
ALT: 141 U/L — ABNORMAL HIGH (ref 0–44)
AST: 289 U/L — ABNORMAL HIGH (ref 15–41)
Albumin: 1.9 g/dL — ABNORMAL LOW (ref 3.5–5.0)
Alkaline Phosphatase: 145 U/L — ABNORMAL HIGH (ref 38–126)
Bilirubin, Direct: 0.3 mg/dL — ABNORMAL HIGH (ref 0.0–0.2)
Indirect Bilirubin: 0.6 mg/dL (ref 0.3–0.9)
Total Bilirubin: 0.9 mg/dL (ref 0.0–1.2)
Total Protein: 6.2 g/dL — ABNORMAL LOW (ref 6.5–8.1)

## 2024-02-21 LAB — VANCOMYCIN, TROUGH: Vancomycin Tr: 26 ug/mL (ref 15–20)

## 2024-02-21 LAB — HEPARIN LEVEL (UNFRACTIONATED): Heparin Unfractionated: 0.24 [IU]/mL — ABNORMAL LOW (ref 0.30–0.70)

## 2024-02-21 LAB — PROCALCITONIN: Procalcitonin: 3.31 ng/mL

## 2024-02-21 LAB — TRIGLYCERIDES: Triglycerides: 80 mg/dL (ref <150)

## 2024-02-21 MED ORDER — METOLAZONE 5 MG PO TABS
5.0000 mg | ORAL_TABLET | Freq: Once | ORAL | Status: AC
  Administered 2024-02-21: 5 mg
  Filled 2024-02-21: qty 1

## 2024-02-21 MED ORDER — ALBUMIN HUMAN 25 % IV SOLN
25.0000 g | Freq: Four times a day (QID) | INTRAVENOUS | Status: AC
Start: 2024-02-21 — End: 2024-02-22
  Administered 2024-02-21 – 2024-02-22 (×4): 25 g via INTRAVENOUS
  Filled 2024-02-21 (×4): qty 100

## 2024-02-21 MED ORDER — VITAL 1.5 CAL PO LIQD
1000.0000 mL | ORAL | Status: DC
  Administered 2024-02-21 – 2024-02-23 (×2): 1000 mL
  Filled 2024-02-21: qty 1000

## 2024-02-21 MED ORDER — POTASSIUM CHLORIDE 20 MEQ PO PACK
40.0000 meq | PACK | ORAL | Status: AC
  Administered 2024-02-21 (×2): 40 meq
  Filled 2024-02-21 (×2): qty 2

## 2024-02-21 MED ORDER — PERFLUTREN LIPID MICROSPHERE
1.0000 mL | INTRAVENOUS | Status: AC | PRN
  Administered 2024-02-21: 4 mL via INTRAVENOUS

## 2024-02-21 NOTE — Progress Notes (Signed)
 Echocardiogram 2D Echocardiogram has been performed.  Dustin Davenport 02/21/2024, 5:33 PM

## 2024-02-21 NOTE — Progress Notes (Signed)
 PHARMACY - ANTICOAGULATION Pharmacy Consult for UFH IV Infusion Indication: atrial fibrillation / LLE DVT Brief A/P: Heparin  level subtherapeutic Increase Heparin  rate  Allergies  Allergen Reactions   Bee Venom     Patient Measurements: Height: 6\' 1"  (185.4 cm) Weight: 116.8 kg (257 lb 8 oz) IBW/kg (Calculated) : 79.9 HEPARIN  DW (KG): 106  Vital Signs: Temp: 98.3 F (36.8 C) (05/05 0325) Temp Source: Axillary (05/05 0325) BP: 124/56 (05/05 0545) Pulse Rate: 57 (05/05 0545)  Labs: Recent Labs    02/19/24 0420 02/19/24 0635 02/19/24 1306 02/19/24 1704 02/20/24 0414 02/20/24 1545 02/21/24 0441  HGB 7.2*  --   --   --  6.9* 8.0* 8.0*  HCT 24.6*  --   --   --  23.4* 26.8* 26.4*  PLT 665*  --   --   --  615*  --  612*  HEPARINUNFRC  --    < > 0.13*  --  0.28*  --  0.24*  CREATININE 1.16  --   --    < > 1.33* 1.44* 1.64*   < > = values in this interval not displayed.    Estimated Creatinine Clearance: 57.7 mL/min (A) (by C-G formula based on SCr of 1.64 mg/dL (H)).   Assessment: 68 y.o. male admitted with STEMI s/p PCI and AAA s/p [place]/AVR 4/16, with AFib and LLE DVT s/p trach 5/2, for heparin   Goal of Therapy:  Heparin  level 0.3-0.5 units/ml Monitor platelets by anticoagulation protocol: Yes   Plan:  Increase Heparin  2000 units/hr  Claudine Cullens, PharmD, BCPS  02/21/2024 6:07 AM

## 2024-02-21 NOTE — Progress Notes (Signed)
 RT NOTE: RT called by RN to place PT back on ventilator for rest. Per RN and PT's wife, CCM Dr. Felipe Horton said PT to be placed back on ventilator at bedtime for rest. RT placed PT on ventilator on previous settings as charted. Vitals are stable. RT will continue to monitor.

## 2024-02-21 NOTE — Progress Notes (Signed)
 Orthopedic Tech Progress Note Patient Details:  Dustin Davenport 06/07/1956 [id number]  Ortho Devices Type of Ortho Device: Ace wrap, Unna boot Ortho Device/Splint Location: BLE Ortho Device/Splint Interventions: Ordered, Application, Adjustment   Post Interventions Patient Tolerated: Well Instructions Provided: Care of device  Dustin Davenport 02/21/2024, 3:46 PM

## 2024-02-21 NOTE — Progress Notes (Signed)
 Lower extremity venous duplex completed. Please see CV Procedures for preliminary results.  Initial findings reported to Wadie Guile, RN.  Estanislao Heimlich, RVT 02/21/24 12:24 PM

## 2024-02-21 NOTE — Progress Notes (Addendum)
 Advanced Heart Failure Rounding Note  Cardiologist: None   Chief Complaint: STEMI> Cardiogenic shock> Now s/p aortic dissection repair + Bentall  Patient Profile   Dustin Davenport is a 68 y.o. male with HTN and HLD. Admitted with STEMI now with CGS. Found to have AAA and taken emergently to the OR. Now s/p Bentall/AVR and reimplantation of coronaries.  Significant events:    - Echo 4/15: AAA measuring 5-5.7 cm. Followed by emergent TEE 4/15 which was significant for torrential aortic regurgitation and dissection flap extending from the aortic root to the proximal ascending aorta  - CT C/A/P: Showed acute type A dissection with a 5.5 cm ascending aortic aneurysm extending into arch. Dissection goes down to infrarenal aorta and extends up into innominate and left common carotid artery - 4/16 Underwent aortic dissection repair (Bentall/AVR and reimplantation of coronaries. Intra-op course c/b coagulopathy due to Effient  (PCI of RCA earlier in the day)).  - Extubated 4/18 - Re-intubated 4/19. Bronch with diffuse mucopurulent secretions - 4/26 Afib with post conversion pause. Amiodarone  decreased.  - 4/28 Started back on  Vanc + Meropenum and Micafungin  added 4/28 per CCM. Resp Cx + Candida   - 4/28 LLE Venous Doppler +DVT (gastroc vein), LUE Venous Doppler + for superficial cephalic vein thrombosis  -5/1 CT head. No acute findings. Started on CRRT.  -No acute events overnight.  -5/3 CRRT stopped.    Subjective:    Remains on Vaso 0.03 units + Norepi 2 mcg.   Awake. Follows commands.   Objective:    Weight Range: 118.2 kg Body mass index is 34.38 kg/m.   Vital Signs:   Temp:  [97.7 F (36.5 C)-98.3 F (36.8 C)] 97.7 F (36.5 C) (05/05 0926) Pulse Rate:  [48-93] 55 (05/05 1043) Resp:  [19-40] 27 (05/05 1043) BP: (84-139)/(36-96) 121/62 (05/05 1043) SpO2:  [89 %-100 %] 95 % (05/05 1043) FiO2 (%):  [40 %-50 %] 50 % (05/05 1043) Weight:  [118.2 kg] 118.2 kg (05/05 0500) Last  BM Date : 02/20/24  Weight change: Filed Weights   02/19/24 0500 02/20/24 0630 02/21/24 0500  Weight: 128 kg 116.8 kg 118.2 kg   Intake/Output:  Intake/Output Summary (Last 24 hours) at 02/21/2024 1055 Last data filed at 02/21/2024 0600 Gross per 24 hour  Intake 3080.95 ml  Output 3135 ml  Net -54.05 ml  CVP 14  Physical Exam  General:   Appears weak Neck: supple. JVP difficult to assess. Track  Cor: PMI nondisplaced. Irregular rate & rhythm. No rubs, gallops or murmurs. Lungs: clear Abdomen: soft, nontender, nondistended.  Extremities: no cyanosis, clubbing, rash, edema Neuro: Follows commands MAE x4.  GU: foley   Telemetry   In and out A fib 90s   Labs    CBC Recent Labs    02/20/24 0414 02/20/24 1545 02/21/24 0441  WBC 29.7*  --  27.1*  HGB 6.9* 8.0* 8.0*  HCT 23.4* 26.8* 26.4*  MCV 92.5  --  91.3  PLT 615*  --  612*   Basic Metabolic Panel Recent Labs    09/81/19 0414 02/20/24 1545 02/21/24 0441  NA 136 140 139  K 3.4* 3.6 3.6  CL 105 107 108  CO2 23 22 22   GLUCOSE 257* 161* 179*  BUN 69* 83* 97*  CREATININE 1.33* 1.44* 1.64*  CALCIUM  7.7* 8.2* 8.2*  MG 2.7*  --  2.8*  PHOS 3.8 4.3 6.0*   Liver Function Tests Recent Labs    02/19/24 1336 02/19/24 1704  02/21/24 0438 02/21/24 0441  AST 346*  --  289*  --   ALT 124*  --  141*  --   ALKPHOS 108  --  145*  --   BILITOT 4.1*  --  0.9  --   PROT 4.7*  --  6.2*  --   ALBUMIN  1.5*   < > 1.9* 1.9*   < > = values in this interval not displayed.   D-Dimer No results for input(s): "DDIMER" in the last 72 hours.  Hemoglobin A1C No results for input(s): "HGBA1C" in the last 72 hours.  Medications:    Scheduled Medications:  sodium chloride    Intravenous Once   acetaminophen   650 mg Per Tube Q6H   amiodarone   400 mg Per Tube BID   Chlorhexidine  Gluconate Cloth  6 each Topical Daily   clonazePAM   0.5 mg Per Tube BID   clopidogrel   75 mg Oral Daily   feeding supplement (PROSource TF20)  60 mL  Per Tube QID   furosemide   80 mg Intravenous BID   Gerhardt's butt cream   Topical Daily   insulin  aspart  0-20 Units Subcutaneous Q4H   insulin  aspart  5 Units Subcutaneous Q4H   insulin  glargine-yfgn  20 Units Subcutaneous BID   multivitamin  1 tablet Oral QHS   mouth rinse  15 mL Mouth Rinse Q2H   pantoprazole  (PROTONIX ) IV  40 mg Intravenous QHS   potassium chloride   40 mEq Per Tube Q4H   QUEtiapine   50 mg Per Tube BID   rosuvastatin   40 mg Oral Daily   scopolamine  1 patch Transdermal Q72H   sodium chloride  flush  10-40 mL Intracatheter Q12H   Infusions:  albumin  human 25 g (02/21/24 0848)   feeding supplement (VITAL 1.5 CAL) 1,000 mL (02/21/24 0849)   heparin  2,000 Units/hr (02/21/24 1008)   meropenem  (MERREM ) IV 1 g (02/21/24 0600)   norepinephrine  (LEVOPHED ) Adult infusion 4 mcg/min (02/21/24 0600)   vancomycin  1,500 mg (02/21/24 1055)   vasopressin  0.03 Units/min (02/21/24 0836)   PRN Medications: albuterol , HYDROmorphone (DILAUDID) injection, ondansetron  (ZOFRAN ) IV, mouth rinse, oxyCODONE , polyvinyl alcohol , sodium chloride  flush   Assessment/Plan   Septic/vasoplegic shock - Now vanc/merrem ; remains on micafungin .  - Discussed with CCM - May need repeat CT; order repeating LFTs to assess for GB etiology.  - WBC 27K today - On vancomycin  + meropenum.   Acute type A aortic dissection  - Echo 4/15: AAA measuring 5-5.7 cm - Emergent TEE 4/15: significant for torrential aortic regurgitation and dissection flap extending from the aortic root to the proximal ascending aorta  - CT C/A/P: Showed acute type A dissection with a 5.5 cm ascending aortic aneurysm extending into arch. Dissection goes down to infrarenal aorta and extends up into innominate and left common carotid arteries  - Now s/p aortic dissection repair 02/01/24 (Bentall/AVR and reimplantation of coronaries. - stable.   2. CAD, Acute inferior STEMI - Admitted with STEMI. Post PCI found to also have aortic  dissection - LHC with single vessel CAD involving the mid to distal RCA, successful PCI of the RCA with overlapping DES x 3. - No aspirin  with heparin  gtt - Continue plavix  75mg  daily - Continue Crestor  40 mg daily - no changes  3. Acute systolic HF ->  Cardiogenic shock - Post perfusion CGS.  - Echo 4/19: EF 25% RV moderate to severely decreased -CVP 14. Getting IV lasix .  -Remains norepi 2 + vaso 0.03. CO-OX 68%.  -Repeat Echo  today.   4. Post-op hypoxic resp failure - extubated 4/18.  - re-intubated 4/19 - s/p tracheostomy. Fio2 50%.  - diffuse mucopurulent secretions on bronch 4/19. BAL GS: Mod WBC. Few gram variable rods. Rare yeast - CT C/A/P 4/27 w/ small postoperative seroma around aortic root/graft and possible PNA.  - RespCx 4/28 + Candida. Completed  micafungin  5/4 - restarting broad spec abx.   5. PAF with post-termination pauses PVCs - Patient with AF intra-op. In and out of A fib.  - Previously on IV amio. Continue amio 400 mg twice a day  - continue heparin  gtt   6. Hypokalemia/hypernatremia -Sodium down to 139 - K 3.6. Supp K     7. AKI due to ATN/shock - Baseline SCr ~1.3-1.4 - CRRT Stopped 5/3. BUN rising.  -Per Nephrology may need to restart tomorrow.   8. ABLA post-op - Received 6uPRBCs, 6 plt, 4 FFP and 5 cryo intra-op.  - Hgb 8  - Transfuse Hgb < 7.0   9. Acute Venous Thrombosis  - +LLE DVT (gastroc vein) - + LUE superficial cephalic venin thrombosis - heparin  gtt until completion of invasive procedures (anticipate likely trach later in the week)   10. Encephalopathy  -- CT head no acute findings.    CRITICAL CARE Performed by: Nieves Bars   Total critical care time: minutes  Critical care time was exclusive of separately billable procedures and treating other patients.  Critical care was necessary to treat or prevent imminent or life-threatening deterioration.  Critical care was time spent personally by me on the following activities:  development of treatment plan with patient and/or surrogate as well as nursing, discussions with consultants, evaluation of patient's response to treatment, examination of patient, obtaining history from patient or surrogate, ordering and performing treatments and interventions, ordering and review of laboratory studies, ordering and review of radiographic studies, pulse oximetry and re-evaluation of patient's condition.    Length of Stay: 20  Nieves Bars, NP  02/21/2024, 10:55 AM  Advanced Heart Failure Team Pager [phone] (M-F; 7a - 5p)  Please contact [facility] for night-coverage after hours (5p -7a ) and weekends on [email or web address]  Patient seen with NP, I formulated the plan and agree with the above note.   I/Os positive but UOP 2860 on Lasix  60 mg IV bid. CVP 10-11 today.  BUN rising, up to 97 today.  Creatinine 1.44 => 1.64.  He is on vancomycin /meropenem  with WBCs 27, now off micafungin  (completed course).    He continues on NE 2, vasopressin  0.03.  MAP stable, Co-ox 68%.   General: NAD Neck: Trach in place, unable to assess JVP.  Lungs: Clear to auscultation bilaterally with normal respiratory effort. CV: Nondisplaced PMI.  Heart regular S1/S2, no S3/S4, no murmur.  1+ edema 1/3 to knees bilaterally.  Abdomen: Soft, nontender, no hepatosplenomegaly, no distention.  Skin: Intact without lesions or rashes.  Neurologic: Alert, follows commands.  Extremities: No clubbing or cyanosis.  HEENT: Normal.   Reasonable UOP but BUN and creatinine rising, suspect he will have to restart HD tomorrow.  Will give Lasix  60 mg IV bid today.  CVP 10-11.   Good co-ox, now on NE 2 and vasopression 0.03.  - Wean down pressors as tolerated.  - Repeat echo.   Trach in place, on vancomycin /meropenem  for empiric PNA coverage. PCT 3.3.  He remains in NSR on amiodarone  and heparin  gtt.   Continue Plavix  with recent PCI.   CRITICAL CARE Performed by: Peder Bourdon  Total  critical care time: 45  minutes  Critical care time was exclusive of separately billable procedures and treating other patients.  Critical care was necessary to treat or prevent imminent or life-threatening deterioration.  Critical care was time spent personally by me on the following activities: development of treatment plan with patient and/or surrogate as well as nursing, discussions with consultants, evaluation of patient's response to treatment, examination of patient, obtaining history from patient or surrogate, ordering and performing treatments and interventions, ordering and review of laboratory studies, ordering and review of radiographic studies, pulse oximetry and re-evaluation of patient's condition.  Peder Bourdon 02/21/2024 11:38 AM

## 2024-02-21 NOTE — Progress Notes (Signed)
 Patient ID: Dustin Davenport, male   DOB: January 24, 1956, 68 y.o.   MRN: [id number] [facility] Progress Note   Assessment/ Plan:   1. Acute kidney Injury: Secondary to ischemic ATN in the setting of STEMI/type aortic dissection.  He remains nonoliguric with slow rise of BUN/creatinine but without any acute indications for dialysis at this time.  Currently on tube feeds with vital 1.5 at 55 cc an hour; will reduce rate to 45 cc/h to limit protein load. 2.  Coronary artery disease: Status post inferior wall STEMI status post PCI to RCA complicated by acute systolic heart failure.  Hemodynamically stable at this point with decent response to furosemide . 3.  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 4.  Anemia: Secondary to aortic dissection/surgical losses and acute/critical illness.  Continue to monitor for PRBC indications.  Subjective:   Without acute events overnight, remains on low-dose pressor.  Urine output robust   Objective:   BP 112/79   Pulse (!) 58   Temp 98.3 F (36.8 C) (Axillary)   Resp (!) 34   Ht 6\' 1"  (1.854 m)   Wt 118.2 kg   SpO2 92%   BMI 34.38 kg/m   Intake/Output Summary (Last 24 hours) at 02/21/2024 0818 Last data filed at 02/21/2024 0600 Gross per 24 hour  Intake 3751.51 ml  Output 3285 ml  Net 466.51 ml   Weight change: 1.4 kg  Physical Exam: Gen: Comfortably resting in bed, awake CVS: Pulse regular bradycardia, S1 and S2 normal Resp: Diminished breath sounds over bases otherwise clear to auscultation.  Supplemental oxygen via tracheostomy Abd: Soft, obese, nontender, bowel sounds normal Ext: 2+ upper extremity edema with 1+ lower extremity edema  Imaging: No results found.  Labs: BMET Recent Labs  Lab 02/18/24 0400 02/18/24 1521 02/19/24 0420 02/19/24 1704 02/20/24 0414 02/20/24 1545 02/21/24 0441  NA 147* 142 133* 137 136 140 139  K 3.7 3.7 3.7 3.7 3.4* 3.6  3.6  CL 110 106 101 103 105 107 108  CO2 27 23 23 23 23 22 22   GLUCOSE 180* 300* 314* 258* 257* 161* 179*  BUN 67* 58* 51* 54* 69* 83* 97*  CREATININE 1.30* 1.27* 1.16 1.26* 1.33* 1.44* 1.64*  CALCIUM  8.4* 8.2* 7.5* 7.7* 7.7* 8.2* 8.2*  PHOS 3.9 3.8 3.3 3.6 3.8 4.3 6.0*   CBC Recent Labs  Lab 02/18/24 0400 02/19/24 0420 02/20/24 0414 02/20/24 1545 02/21/24 0441  WBC 18.2* 26.1* 29.7*  --  27.1*  HGB 7.3* 7.2* 6.9* 8.0* 8.0*  HCT 24.6* 24.6* 23.4* 26.8* 26.4*  MCV 91.8 90.8 92.5  --  91.3  PLT 756* 665* 615*  --  612*    Medications:     sodium chloride    Intravenous Once   acetaminophen   650 mg Per Tube Q6H   amiodarone   400 mg Per Tube BID   Chlorhexidine  Gluconate Cloth  6 each Topical Daily   clonazePAM   0.5 mg Per Tube BID   clopidogrel   75 mg Oral Daily   feeding supplement (PROSource TF20)  60 mL Per Tube QID   furosemide   80 mg Intravenous BID   Gerhardt's butt cream   Topical Daily   insulin  aspart  0-20 Units Subcutaneous Q4H   insulin  aspart  5 Units Subcutaneous Q4H   insulin  glargine-yfgn  20 Units Subcutaneous BID   metolazone   5 mg Per Tube Once   multivitamin  1 tablet Oral QHS  mouth rinse  15 mL Mouth Rinse Q2H   pantoprazole  (PROTONIX ) IV  40 mg Intravenous QHS   potassium chloride   40 mEq Per Tube Q4H   QUEtiapine   50 mg Per Tube BID   rosuvastatin   40 mg Oral Daily   scopolamine  1 patch Transdermal Q72H   sodium chloride  flush  10-40 mL Intracatheter Q12H    Clevester Dally, MD 02/21/2024, 8:18 AM

## 2024-02-21 NOTE — Progress Notes (Signed)
 Dustin Davenport, MRN:  [id number], DOB:  Feb 25, 1956, LOS: 20 ADMISSION DATE:  02/01/2024, CONSULTATION DATE:  02/03/24 REFERRING MD:  Sherene Dilling, CHIEF COMPLAINT:  chest pain   History of Present Illness:  68 year old man w/ hx of HTN, obesity who presented on 4/15 with sudden onset chest pain.  EKG showing inferior STEMI and went to cath lab for RCA DES x 3.  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  Taken emergently to OR for Bentall+ ascending/arch replacement.  Procedure complicated by coagulopathy related to antiplatelet therapy from recent stent, multiple liters of blood products given.  Returned to ICU 4/16 on vent and PCCM consulted to assist with vent wean on 4/17.  Currently heavily sedated; attempts at weaning resulted in agitation without ability to redirect.  Pertinent  Medical History   Past Medical History:  Diagnosis Date   Hypertension     Significant Hospital Events: Including procedures, antibiotic start and stop dates in addition to other pertinent events   4/16 RCA stent then Bentall, aortic arch replacement 4/18 extubated 4/19 reintubated in afternoon for aspiration, bronch, Delirious, low grade fevers, expressive aphasia 4/20 but mental status precludes extubation 4/21 started spiking fever again, meropenem  switched back from Zosyn , on vancomycin  as well 02/07/22 failed spontaneous breathing trial 4/24 Tachypnea, fever. Resp culture sent  4/25 underwent right heart cath showing normal filling pressure 4/26 remained in A-fib, became afebrile, Precedex  was switched to low-dose propofol  5/2 trach 5/3 ongoing fevers, leukocytosis; vanc/meropenem  started; CRRT held 5/4 HD catheter removed for ongoing leukocytosis  Interim History / Subjective:  Making urine Tolerated TC through night!  Objective   Blood pressure (!) 109/59, pulse (!) 58, temperature 98.3 F (36.8 C),  temperature source Axillary, resp. rate (!) 34, height 6\' 1"  (1.854 m), weight 118.2 kg, SpO2 96%. CVP:  [4 mmHg-27 mmHg] 11 mmHg  Vent Mode: PSV;CPAP FiO2 (%):  [40 %-50 %] 50 % Set Rate:  [20 bmp] 20 bmp Vt Set:  [570 mL] 570 mL PEEP:  [8 cmH20] 8 cmH20 Pressure Support:  [12 cmH20] 12 cmH20 Plateau Pressure:  [16 cmH20-17 cmH20] 17 cmH20   Intake/Output Summary (Last 24 hours) at 02/21/2024 0701 Last data filed at 02/21/2024 0600 Gross per 24 hour  Intake 3907.82 ml  Output 2685 ml  Net 1222.82 ml   Filed Weights   02/19/24 0500 02/20/24 0630 02/21/24 0500  Weight: 128 kg 116.8 kg 118.2 kg    Examination: Chronically ill Diffuse anasarca Ext warm Moves to command Profoundly weak Small thick secretions Shallow inspiratory effort  WBC improved slightly Ongoing thrombocytosis Pct up but in context of renal dysfunction    dexmedetomidine  (PRECEDEX ) IV infusion Stopped (02/20/24 1511)   feeding supplement (VITAL 1.5 CAL) 55 mL/hr at 02/21/24 0600   heparin  2,000 Units/hr (02/21/24 0641)   meropenem  (MERREM ) IV 1 g (02/21/24 0600)   norepinephrine  (LEVOPHED ) Adult infusion 4 mcg/min (02/21/24 0600)   vancomycin  Stopped (02/20/24 1254)   vasopressin  0.03 Units/min (02/21/24 0600)   Resolved Hospital Problem list   Constipation Shock combined cardiogenic/septic Hypokalemia/hypophosphatemia Shock liver, improving Perioperative coagulopathy  Ileus, resolved- off reglan  Constipation, resolved  Assessment & Plan:  Acute inferior wall STEMI status post PCI and DES Type A aortic dissection with s/p emergent aortic reconstruction and aortic root replacement Paroxysmal A-fib with RVR; previously bradycardic on amiodarone ; intermittent brady now with precedex  restart Acute respiratory failure with  hypoxia and hypercapnia- starting to get TC trials Bilateral multifocal pneumonia- copious ongoing presumably noninfectious? secretions Acute kidney injury due to ischemic ATN from  shock, stable CRRT held 5/3; no immediate need to restart at this time; primarily done for uremia Acute encephalopathy, likely septic and uremia. Improving slowly; waxing waning; may benefit from MRI at some point Perioperative acute blood loss anemia- stable; now just related to bone marrow stunning and phlebotomy Obesity Leukocytosis- still an issue; slightly better today Uncontrolled hyperglycemia; A1c 5.3 Hypokalemia Stage 1 sacral ulcer LLE DVT- on heparin  gtt  - Continue multimodal pain control strategy (adjusted last 5/3) with seroquel  added 5/4 - Unna boots, albumin , lasix , metolazone ; may need HD again as uremia/hypercatabolic state is an issue - Basal bolus insulin  - Repeat echo, if improved EF add midodrine - Continue plavix  for RCA stent - TC trials as able - Wife updated at bedside 5/4 - I think we can start working toward [facility] once we sort out white count and final HD access - Will discuss PEG with wife today  Best Practice (right click and "Reselect all SmartList Selections" daily)   Diet/type: Tube feeds DVT prophylaxis heparin  gtt Pressure ulcer(s): yes, see nursing documentation GI prophylaxis: PPI Lines: Central line picc Foley:  Yes, and it is still needed Code Status:  full code Last date of multidisciplinary goals of care discussion: 5/4 updated at bedside  43 min cc time  Josiah Nigh, MD 02/21/24 7:01 AM [facility]  For contact information, see Amion. If no response to pager, please call PCCM consult pager. After hours, 7PM- 7AM, please call Elink.

## 2024-02-21 NOTE — Progress Notes (Signed)
 PHARMACY ANTIBIOTIC CONSULT NOTE   Dustin Davenport a 68 y.o. male admitted on 4/15 s/p type A dissection and Bentall repair and PCI/DES to RCA. Patient had been on broad spectrum antibiotics with meropenem >zoysn wbc increased and changed back to meropenem  4/24 - stopped 4/30> restarted 5/3 vancomycin  stopped 5/1 restarted 5/3> with increased PCT, increased wbc 30 >27 and low grade fever  Tm99 Cxr with opacity RLL.  Cr 1.6 now off CRRT Vancomycin  1500mg  q24h with VT 26 - drawn 6hr early so true trough estimated at goal 15-20 -micafungin  4/30>5/4 - yeast in TA  Plan: Meropenem  1gm IV q8h Vancomycin  1500mg  IV q24h  Monitor renal function, clinical status, de-escalation, C/S, levels as indicated   Allergies:  Allergies  Allergen Reactions   Bee Venom     Filed Weights   02/19/24 0500 02/20/24 0630 02/21/24 0500  Weight: 128 kg (282 lb 3 oz) 116.8 kg (257 lb 8 oz) 118.2 kg (260 lb 9.3 oz)       Latest Ref Rng & Units 02/21/2024    4:41 AM 02/20/2024    3:45 PM 02/20/2024    4:14 AM  CBC  WBC 4.0 - 10.5 K/uL 27.1   29.7   Hemoglobin 13.0 - 17.0 g/dL 8.0  8.0  6.9   Hematocrit 39.0 - 52.0 % 26.4  26.8  23.4   Platelets 150 - 400 K/uL 612   615     Antibiotics Given (last 72 hours)     Date/Time Action Medication Dose Rate   02/19/24 1251 New Bag/Given   vancomycin  (VANCOREADY) IVPB 2000 mg/400 mL 2,000 mg 200 mL/hr   02/19/24 1533 New Bag/Given   meropenem  (MERREM ) 1 g in sodium chloride  0.9 % 100 mL IVPB 1 g 200 mL/hr   02/19/24 2146 New Bag/Given   meropenem  (MERREM ) 1 g in sodium chloride  0.9 % 100 mL IVPB 1 g 200 mL/hr   02/20/24 0514 New Bag/Given   meropenem  (MERREM ) 1 g in sodium chloride  0.9 % 100 mL IVPB 1 g 200 mL/hr   02/20/24 1054 New Bag/Given   vancomycin  (VANCOREADY) IVPB 1500 mg/300 mL 1,500 mg 150 mL/hr   02/20/24 1431 Started During Downtime   meropenem  (MERREM ) 1 g in sodium chloride  0.9 % 100 mL IVPB 1 g 200 mL/hr   02/20/24 2230 New Bag/Given   meropenem   (MERREM ) 1 g in sodium chloride  0.9 % 100 mL IVPB 1 g 200 mL/hr   02/21/24 0600 New Bag/Given   meropenem  (MERREM ) 1 g in sodium chloride  0.9 % 100 mL IVPB 1 g 200 mL/hr       Antimicrobials this admission: Ancef  periop 4/15 - 4/17 Vanc 4/19 >4/25 restart 4/28 >4/30 restarted 5/3> Merrem  4/19 > 4/21 restarted 4/24>4/30, rstarted 5/3> Zosyn  4/21 >4/23  Micafungin  4/28> 5/4  Microbiology results: 4/15 MRSA: neg (surgical PCR +MSSA) 4/19 BAL: gm variable rod, rare yeast pseudohyphae 4/19 MRSA: neg SA + 4/24 TA candida albicans    [name] Pharm.D. CPP, BCPS Clinical Pharmacist [phone] 02/21/2024 10:04 AM   Please check AMION for all [facility] Pharmacy phone numbers After 10:00 PM, call Main Pharmacy [phone]

## 2024-02-22 ENCOUNTER — Inpatient Hospital Stay (HOSPITAL_COMMUNITY)

## 2024-02-22 DIAGNOSIS — I2111 ST elevation (STEMI) myocardial infarction involving right coronary artery: Secondary | ICD-10-CM | POA: Diagnosis not present

## 2024-02-22 DIAGNOSIS — I7101 Dissection of ascending aorta: Secondary | ICD-10-CM | POA: Diagnosis not present

## 2024-02-22 DIAGNOSIS — E876 Hypokalemia: Secondary | ICD-10-CM | POA: Diagnosis not present

## 2024-02-22 DIAGNOSIS — J9601 Acute respiratory failure with hypoxia: Secondary | ICD-10-CM | POA: Diagnosis not present

## 2024-02-22 DIAGNOSIS — I480 Paroxysmal atrial fibrillation: Secondary | ICD-10-CM | POA: Diagnosis not present

## 2024-02-22 DIAGNOSIS — I5020 Unspecified systolic (congestive) heart failure: Secondary | ICD-10-CM | POA: Diagnosis not present

## 2024-02-22 LAB — BASIC METABOLIC PANEL WITH GFR
Anion gap: 12 (ref 5–15)
BUN: 101 mg/dL — ABNORMAL HIGH (ref 8–23)
CO2: 23 mmol/L (ref 22–32)
Calcium: 8.3 mg/dL — ABNORMAL LOW (ref 8.9–10.3)
Chloride: 111 mmol/L (ref 98–111)
Creatinine, Ser: 1.55 mg/dL — ABNORMAL HIGH (ref 0.61–1.24)
GFR, Estimated: 48 mL/min — ABNORMAL LOW (ref >60)
Glucose, Bld: 130 mg/dL — ABNORMAL HIGH (ref 70–99)
Potassium: 3.4 mmol/L — ABNORMAL LOW (ref 3.5–5.1)
Sodium: 146 mmol/L — ABNORMAL HIGH (ref 135–145)

## 2024-02-22 LAB — GLUCOSE, CAPILLARY
Glucose-Capillary: 124 mg/dL — ABNORMAL HIGH (ref 70–99)
Glucose-Capillary: 111 mg/dL — ABNORMAL HIGH (ref 70–99)
Glucose-Capillary: 115 mg/dL — ABNORMAL HIGH (ref 70–99)
Glucose-Capillary: 126 mg/dL — ABNORMAL HIGH (ref 70–99)

## 2024-02-22 LAB — RENAL FUNCTION PANEL
Albumin: 3 g/dL — ABNORMAL LOW (ref 3.5–5.0)
Anion gap: 16 — ABNORMAL HIGH (ref 5–15)
BUN: 108 mg/dL — ABNORMAL HIGH (ref 8–23)
CO2: 21 mmol/L — ABNORMAL LOW (ref 22–32)
Calcium: 9.1 mg/dL (ref 8.9–10.3)
Chloride: 107 mmol/L (ref 98–111)
Creatinine, Ser: 1.58 mg/dL — ABNORMAL HIGH (ref 0.61–1.24)
GFR, Estimated: 47 mL/min — ABNORMAL LOW (ref >60)
Glucose, Bld: 151 mg/dL — ABNORMAL HIGH (ref 70–99)
Phosphorus: 5.3 mg/dL — ABNORMAL HIGH (ref 2.5–4.6)
Potassium: 3.1 mmol/L — ABNORMAL LOW (ref 3.5–5.1)
Sodium: 144 mmol/L (ref 135–145)

## 2024-02-22 LAB — CBC
HCT: 24.6 % — ABNORMAL LOW (ref 39.0–52.0)
Hemoglobin: 7.2 g/dL — ABNORMAL LOW (ref 13.0–17.0)
MCH: 26.6 pg (ref 26.0–34.0)
MCHC: 29.3 g/dL — ABNORMAL LOW (ref 30.0–36.0)
MCV: 90.8 fL (ref 80.0–100.0)
Platelets: 578 10*3/uL — ABNORMAL HIGH (ref 150–400)
RBC: 2.71 MIL/uL — ABNORMAL LOW (ref 4.22–5.81)
RDW: 20.2 % — ABNORMAL HIGH (ref 11.5–15.5)
WBC: 19.2 10*3/uL — ABNORMAL HIGH (ref 4.0–10.5)
nRBC: 34.3 % — ABNORMAL HIGH (ref 0.0–0.2)

## 2024-02-22 LAB — COOXEMETRY PANEL
Carboxyhemoglobin: 2 % — ABNORMAL HIGH (ref 0.5–1.5)
Methemoglobin: <0.7 % (ref 0.0–1.5)
O2 Saturation: 70.7 %
Total hemoglobin: 7.8 g/dL — ABNORMAL LOW (ref 12.0–16.0)

## 2024-02-22 LAB — VANCOMYCIN, TROUGH: Vancomycin Tr: 25 ug/mL (ref 15–20)

## 2024-02-22 LAB — HEPARIN LEVEL (UNFRACTIONATED): Heparin Unfractionated: 0.32 [IU]/mL (ref 0.30–0.70)

## 2024-02-22 MED ORDER — MIDAZOLAM HCL 2 MG/2ML IJ SOLN
4.0000 mg | Freq: Once | INTRAMUSCULAR | Status: AC
  Administered 2024-02-22: 4 mg via INTRAVENOUS
  Filled 2024-02-22: qty 4

## 2024-02-22 MED ORDER — MIDODRINE HCL 5 MG PO TABS
10.0000 mg | ORAL_TABLET | Freq: Three times a day (TID) | ORAL | Status: DC
  Administered 2024-02-22 – 2024-02-27 (×17): 10 mg
  Filled 2024-02-22 (×18): qty 2

## 2024-02-22 MED ORDER — DEXMEDETOMIDINE HCL IN NACL 400 MCG/100ML IV SOLN: 0.0000 ug/kg/h | INTRAVENOUS | Status: DC

## 2024-02-22 MED ORDER — POTASSIUM CHLORIDE 20 MEQ PO PACK
40.0000 meq | PACK | ORAL | Status: AC
  Administered 2024-02-22 (×2): 40 meq
  Filled 2024-02-22 (×2): qty 2

## 2024-02-22 MED ORDER — MIDAZOLAM HCL 2 MG/2ML IJ SOLN
4.0000 mg | Freq: Once | INTRAMUSCULAR | Status: AC
  Administered 2024-02-23: 2 mg via INTRAVENOUS
  Filled 2024-02-22 (×2): qty 4

## 2024-02-22 MED ORDER — FENTANYL CITRATE PF 50 MCG/ML IJ SOSY
100.0000 ug | PREFILLED_SYRINGE | Freq: Once | INTRAMUSCULAR | Status: AC
  Administered 2024-02-22: 100 ug via INTRAVENOUS
  Filled 2024-02-22: qty 2

## 2024-02-22 MED ORDER — MIDODRINE HCL 5 MG PO TABS: 5.0000 mg | ORAL_TABLET | Freq: Three times a day (TID) | ORAL | Status: DC

## 2024-02-22 MED ORDER — VANCOMYCIN HCL IN DEXTROSE 1-5 GM/200ML-% IV SOLN
1000.0000 mg | INTRAVENOUS | Status: DC
  Administered 2024-02-22 – 2024-02-24 (×3): 1000 mg via INTRAVENOUS
  Filled 2024-02-22 (×3): qty 200

## 2024-02-22 MED ORDER — AMIODARONE HCL IN DEXTROSE 360-4.14 MG/200ML-% IV SOLN
30.0000 mg/h | INTRAVENOUS | Status: DC
  Administered 2024-02-22 – 2024-02-27 (×10): 30 mg/h via INTRAVENOUS
  Filled 2024-02-22 (×11): qty 200

## 2024-02-22 MED ORDER — OXYCODONE HCL 5 MG PO TABS
10.0000 mg | ORAL_TABLET | Freq: Three times a day (TID) | ORAL | Status: DC
  Administered 2024-02-22 – 2024-02-23 (×4): 10 mg
  Filled 2024-02-22 (×4): qty 2

## 2024-02-22 MED ORDER — POTASSIUM CHLORIDE 20 MEQ PO PACK
60.0000 meq | PACK | Freq: Once | ORAL | Status: AC
  Administered 2024-02-22: 60 meq
  Filled 2024-02-22: qty 3

## 2024-02-22 NOTE — Progress Notes (Signed)
 Patient ID: Dustin Davenport, male   DOB: May 22, 1956, 69 y.o.   MRN: [id number] [facility] Progress Note   Assessment/ Plan:   1. Acute kidney Injury: Secondary to ischemic ATN in the setting of STEMI/type aortic dissection.  He remains nonoliguric with stable renal function but worsening azotemia in the setting of aggressive diuresis and ongoing tube feeds.  He does not have any indication for dialysis at this time and will undergo titration of diuretic dosing based on volume status. 2.  Coronary artery disease: Status post inferior wall STEMI status post PCI to RCA complicated by acute systolic heart failure.  Hemodynamically stable at this point with decent response to furosemide . 3.  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 4.  Anemia: Secondary to aortic dissection/surgical losses and acute/critical illness.  Continue to monitor for PRBC indications. 5.  Hypokalemia: Secondary to diuretic induced losses, replaced via oral route.  Subjective:   With excellent urine output overnight and without any acute clinical events   Objective:   BP (!) 119/54   Pulse 63   Temp 98.2 F (36.8 C) (Axillary)   Resp 16   Ht 6\' 1"  (1.854 m)   Wt 120.8 kg   SpO2 94%   BMI 35.14 kg/m   Intake/Output Summary (Last 24 hours) at 02/22/2024 0816 Last data filed at 02/22/2024 5784 Gross per 24 hour  Intake 2436.19 ml  Output 5535 ml  Net -3098.81 ml   Weight change: 2.6 kg  Physical Exam: Gen: Resting comfortably in bed, wife at bedside CVS: Pulse regular bradycardia, S1 and S2 normal Resp: Diminished breath sounds over bases otherwise clear to auscultation.  Supplemental oxygen via tracheostomy Abd: Soft, obese, nontender, bowel sounds normal Ext: 2+ upper extremity edema with 1+ lower extremity edema  Imaging: ECHOCARDIOGRAM COMPLETE Result Date: 02/21/2024    ECHOCARDIOGRAM REPORT   Patient Name:   Dustin Davenport  Kho Date of Exam: 02/21/2024 Medical Rec #:  [id number]    Height:       73.0 in Accession #:    [id number]   Weight:       260.6 lb Date of Birth:  [birth year]    BSA:          2.408 m Patient Age:    67 years     BP:           117/54 mmHg Patient Gender: M            HR:           97 bpm. Exam Location:  Inpatient Procedure: 2D Echo, Cardiac Doppler, Color Doppler and Intracardiac            Opacification Agent (Both Spectral and Color Flow Doppler were            utilized during procedure). Indications:    CHF I50.21  History:        Patient has prior history of Echocardiogram examinations, most                 recent 02/05/2024. Acute MI. Aortic dissection repair.                 Aortic Valve: 27 mm bioprosthetic valve is present in the aortic                 position. Procedure Date: 02/01/24.  Sonographer:    Terrilee Few RCS Referring Phys: [phone] Jolinda Necessary [facility]  IMPRESSIONS  1. Left ventricular ejection fraction, by estimation, is 40%. The left ventricle has moderately decreased function. The left ventricle demonstrates global hypokinesis. There is mild concentric left ventricular hypertrophy. Left ventricular diastolic parameters are indeterminate.  2. Right ventricular systolic function is normal. The right ventricular size is mildly enlarged. There is mildly elevated pulmonary artery systolic pressure.  3. Left atrial size was mildly dilated.  4. Right atrial size was moderately dilated.  5. A small pericardial effusion is present. The pericardial effusion is circumferential.  6. The mitral valve is normal in structure. Trivial mitral valve regurgitation. No evidence of mitral stenosis.  7. The aortic valve has been repaired/replaced. Aortic valve regurgitation is not visualized. No aortic stenosis is present. There is a 27 mm bioprosthetic valve present in the aortic position. Procedure Date: 02/01/24.  8. Aortic root/ascending aorta has been repaired/replaced.  9. The inferior vena cava is dilated in size  with <50% respiratory variability, suggesting right atrial pressure of 15 mmHg. Comparison(s): Prior images reviewed side by side. LV function slightly improved compared to prior. FINDINGS  Left Ventricle: Left ventricular ejection fraction, by estimation, is 40%. The left ventricle has moderately decreased function. The left ventricle demonstrates global hypokinesis. Definity  contrast agent was given IV to delineate the left ventricular endocardial borders. The left ventricular internal cavity size was normal in size. There is mild concentric left ventricular hypertrophy. Left ventricular diastolic parameters are indeterminate. Right Ventricle: The right ventricular size is mildly enlarged. Right vetricular wall thickness was not well visualized. Right ventricular systolic function is normal. There is mildly elevated pulmonary artery systolic pressure. The tricuspid regurgitant  velocity is 2.60 m/s, and with an assumed right atrial pressure of 15 mmHg, the estimated right ventricular systolic pressure is 42.0 mmHg. Left Atrium: Left atrial size was mildly dilated. Right Atrium: Right atrial size was moderately dilated. Pericardium: A small pericardial effusion is present. The pericardial effusion is circumferential. Mitral Valve: The mitral valve is normal in structure. Trivial mitral valve regurgitation. No evidence of mitral valve stenosis. Tricuspid Valve: The tricuspid valve is normal in structure. Tricuspid valve regurgitation is trivial. No evidence of tricuspid stenosis. Aortic Valve: The aortic valve has been repaired/replaced. Aortic valve regurgitation is not visualized. No aortic stenosis is present. Aortic valve peak gradient measures 11.7 mmHg. There is a 27 mm bioprosthetic valve present in the aortic position. Procedure Date: 02/01/24. Pulmonic Valve: The pulmonic valve was not well visualized. Pulmonic valve regurgitation is trivial. No evidence of pulmonic stenosis. Aorta: The aortic root/ascending  aorta has been repaired/replaced. Venous: The inferior vena cava is dilated in size with less than 50% respiratory variability, suggesting right atrial pressure of 15 mmHg. IAS/Shunts: The atrial septum is grossly normal.  LEFT VENTRICLE PLAX 2D LVIDd:         3.90 cm   Diastology LVIDs:         3.30 cm   LV e' medial:    12.50 cm/s LV PW:         1.30 cm   LV E/e' medial:  7.9 LV IVS:        1.20 cm   LV e' lateral:   21.00 cm/s LVOT diam:     2.40 cm   LV E/e' lateral: 4.7 LV SV:         56 LV SV Index:   23 LVOT Area:     4.52 cm  RIGHT VENTRICLE  IVC RV S prime:     15.30 cm/s  IVC diam: 2.60 cm TAPSE (M-mode): 1.8 cm LEFT ATRIUM             Index        RIGHT ATRIUM           Index LA diam:        3.90 cm 1.62 cm/m   RA Area:     30.40 cm LA Vol (A2C):   70.5 ml 29.28 ml/m  RA Volume:   115.00 ml 47.76 ml/m LA Vol (A4C):   77.0 ml 31.98 ml/m LA Biplane Vol: 73.7 ml 30.61 ml/m  AORTIC VALVE AV Area (Vmax): 2.24 cm AV Vmax:        171.00 cm/s AV Peak Grad:   11.7 mmHg LVOT Vmax:      84.55 cm/s LVOT Vmean:     50.350 cm/s LVOT VTI:       0.124 m  AORTA Ao Root diam: 3.70 cm Ao Asc diam:  3.10 cm MITRAL VALVE               TRICUSPID VALVE MV Area (PHT): 4.21 cm    TR Peak grad:   27.0 mmHg MV Decel Time: 180 msec    TR Vmax:        260.00 cm/s MV E velocity: 99.10 cm/s MV A velocity: 49.80 cm/s  SHUNTS MV E/A ratio:  1.99        Systemic VTI:  0.12 m                            Systemic Diam: 2.40 cm Sheryle Donning MD Electronically signed by Sheryle Donning MD Signature Date/Time: 02/21/2024/9:15:26 PM    Final    VAS US  LOWER EXTREMITY VENOUS (DVT) Result Date: 02/21/2024  Lower Venous DVT Study Patient Name:  Dustin Davenport  Date of Exam:   02/21/2024 Medical Rec #: [id number]     Accession #:    [id number] Date of Birth: [birth year]     Patient Gender: M Patient Age:   12 years Exam Location:  [facility] Procedure:      VAS US  LOWER EXTREMITY VENOUS (DVT) Referring Phys:  Felton Hough --------------------------------------------------------------------------------  Indications: Edema.  Risk Factors: DVT Hx. Comparison Study: Thrombus reductions seen since previous exam 02/14/24. Performing Technologist: Estanislao Heimlich  Examination Guidelines: A complete evaluation includes B-mode imaging, spectral Doppler, color Doppler, and power Doppler as needed of all accessible portions of each vessel. Bilateral testing is considered an integral part of a complete examination. Limited examinations for reoccurring indications may be performed as noted. The reflux portion of the exam is performed with the patient in reverse Trendelenburg.  +---------+---------------+---------+-----------+----------+--------------+ RIGHT    CompressibilityPhasicitySpontaneityPropertiesThrombus Aging +---------+---------------+---------+-----------+----------+--------------+ CFV      Full           Yes      Yes                                 +---------+---------------+---------+-----------+----------+--------------+ SFJ      Full                                                        +---------+---------------+---------+-----------+----------+--------------+  FV Prox  Full                                                        +---------+---------------+---------+-----------+----------+--------------+ FV Mid   Full                                                        +---------+---------------+---------+-----------+----------+--------------+ FV DistalFull                                                        +---------+---------------+---------+-----------+----------+--------------+ PFV      Full                                                        +---------+---------------+---------+-----------+----------+--------------+ POP      Full           Yes      Yes                                  +---------+---------------+---------+-----------+----------+--------------+ PTV      Full                    Yes                                 +---------+---------------+---------+-----------+----------+--------------+ PERO     Full                    Yes                                 +---------+---------------+---------+-----------+----------+--------------+   +---------+---------------+---------+-----------+----------+-----------------+ LEFT     CompressibilityPhasicitySpontaneityPropertiesThrombus Aging    +---------+---------------+---------+-----------+----------+-----------------+ CFV      Full           Yes      Yes                                    +---------+---------------+---------+-----------+----------+-----------------+ SFJ      Full                                                           +---------+---------------+---------+-----------+----------+-----------------+ FV Prox  Full                                                           +---------+---------------+---------+-----------+----------+-----------------+  FV Mid   Full                                                           +---------+---------------+---------+-----------+----------+-----------------+ FV Distal                        Yes                                    +---------+---------------+---------+-----------+----------+-----------------+ PFV      Full                                                           +---------+---------------+---------+-----------+----------+-----------------+ POP      Full           Yes      Yes                                    +---------+---------------+---------+-----------+----------+-----------------+ PTV      Full                    Yes                                    +---------+---------------+---------+-----------+----------+-----------------+ PERO     Full                    Yes                                     +---------+---------------+---------+-----------+----------+-----------------+ Gastroc  Partial        No       Yes                  Age Indeterminate +---------+---------------+---------+-----------+----------+-----------------+     Summary: RIGHT: - There is no evidence of deep vein thrombosis in the lower extremity.  - No cystic structure found in the popliteal fossa.  LEFT: Findings consistent with age indeterminate intramuscular thrombus involving the left gastrocnemius veins. - No cystic structure found in the popliteal fossa.  *See table(s) above for measurements and observations. Electronically signed by Angela Kell MD on 02/21/2024 at 4:03:55 PM.    Final     Labs: BMET Recent Labs  Lab 02/18/24 1521 02/19/24 7829 02/19/24 1704 02/20/24 0414 02/20/24 1545 02/21/24 0441 02/21/24 1543 02/22/24 0327  NA 142 133* 137 136 140 139 143 144  K 3.7 3.7 3.7 3.4* 3.6 3.6 3.5 3.1*  CL 106 101 103 105 107 108 104 107  CO2 23 23 23 23 22 22  20* 21*  GLUCOSE 300* 314* 258* 257* 161* 179* 130* 151*  BUN 58* 51* 54* 69* 83* 97* 90* 108*  CREATININE 1.27* 1.16 1.26* 1.33* 1.44* 1.64* 1.54* 1.58*  CALCIUM  8.2* 7.5* 7.7* 7.7* 8.2* 8.2* 8.0* 9.1  PHOS 3.8  3.3 3.6 3.8 4.3 6.0*  --  5.3*   CBC Recent Labs  Lab 02/18/24 0400 02/19/24 0420 02/20/24 0414 02/20/24 1545 02/21/24 0441  WBC 18.2* 26.1* 29.7*  --  27.1*  HGB 7.3* 7.2* 6.9* 8.0* 8.0*  HCT 24.6* 24.6* 23.4* 26.8* 26.4*  MCV 91.8 90.8 92.5  --  91.3  PLT 756* 665* 615*  --  612*    Medications:     sodium chloride    Intravenous Once   acetaminophen   650 mg Per Tube Q6H   amiodarone   400 mg Per Tube BID   Chlorhexidine  Gluconate Cloth  6 each Topical Daily   clonazePAM   0.5 mg Per Tube BID   clopidogrel   75 mg Oral Daily   feeding supplement (PROSource TF20)  60 mL Per Tube QID   furosemide   80 mg Intravenous BID   Gerhardt's butt cream   Topical Daily   insulin  aspart  0-20 Units Subcutaneous Q4H    insulin  aspart  5 Units Subcutaneous Q4H   insulin  glargine-yfgn  20 Units Subcutaneous BID   midodrine  10 mg Per Tube TID WC   multivitamin  1 tablet Oral QHS   mouth rinse  15 mL Mouth Rinse Q2H   oxyCODONE   10 mg Per Tube Q8H   pantoprazole  (PROTONIX ) IV  40 mg Intravenous QHS   potassium chloride   40 mEq Per Tube Q4H   QUEtiapine   50 mg Per Tube BID   rosuvastatin   40 mg Oral Daily   scopolamine  1 patch Transdermal Q72H   sodium chloride  flush  10-40 mL Intracatheter Q12H    Clevester Dally, MD 02/22/2024, 8:16 AM

## 2024-02-22 NOTE — Progress Notes (Signed)
 PHARMACY - ANTICOAGULATION CONSULT NOTE  Pharmacy Consult for UFH IV Infusion Indication: atrial fibrillation / LLE DVT  Allergies  Allergen Reactions   Bee Venom     Patient Measurements: Height: 6\' 1"  (185.4 cm) Weight: 120.8 kg (266 lb 5.1 oz) IBW/kg (Calculated) : 79.9 HEPARIN  DW (KG): 106  Vital Signs: Temp: 97.9 F (36.6 C) (05/05 2356) Temp Source: Oral (05/05 2356) BP: 119/54 (05/06 0600) Pulse Rate: 69 (05/06 0600)  Labs: Recent Labs    02/20/24 0414 02/20/24 1545 02/21/24 0441 02/21/24 1543 02/22/24 0327  HGB 6.9* 8.0* 8.0*  --   --   HCT 23.4* 26.8* 26.4*  --   --   PLT 615*  --  612*  --   --   HEPARINUNFRC 0.28*  --  0.24*  --  0.32  CREATININE 1.33* 1.44* 1.64* 1.54* 1.58*    Estimated Creatinine Clearance: 60.9 mL/min (A) (by C-G formula based on SCr of 1.58 mg/dL (H)).   Medical History: Past Medical History:  Diagnosis Date   Hypertension     Medications:   Infusions:   feeding supplement (VITAL 1.5 CAL) 45 mL/hr at 02/22/24 0600   heparin  2,000 Units/hr (02/22/24 0600)   meropenem  (MERREM ) IV Stopped (02/22/24 0552)   norepinephrine  (LEVOPHED ) Adult infusion 3 mcg/min (02/22/24 0600)   vancomycin  Stopped (02/21/24 1256)   vasopressin  0.03 Units/min (02/22/24 0600)   PRN: albuterol , HYDROmorphone (DILAUDID) injection, ondansetron  (ZOFRAN ) IV, mouth rinse, oxyCODONE , polyvinyl alcohol , sodium chloride  flush    Assessment: Dustin Davenport is 68 y.o. male with PMH HTN and HLD. Admitted with STEMI now with CGS. Found to have AAA requiring emergent surgical repair. Now s/p Bentall/AVR.  Pharmacy asked to start IV heparin  4/27 for afib.  LLE DVT and superficial thrombus in LUE found on doppler 4/28. Afib rate controled on amiodarone   S/p trach, some active oozing in RLL during procedure and around trach site - resolved.  Per RN, trach site has some slightly pink-tinged output from ETT - resolved   Heparin  drip 2000 uts/hr with heparin  level  0.32 at goal No bleeding noted - CBC in progress Holding CRRT   Goal of Therapy:  Heparin  level 0.3-0.5 units/ml Monitor platelets by anticoagulation protocol: Yes   Plan:  Continue Heparin  drip  rate 2000 units/hr Daily heparin  level and CBC. Monitor s/s bleeding.  [name] Pharm.D. CPP, BCPS Clinical Pharmacist [phone] 02/22/2024 7:22 AM

## 2024-02-22 NOTE — Plan of Care (Signed)
   Problem: Education: Goal: Knowledge of General Education information will improve Description: Including pain rating scale, medication(s)/side effects and non-pharmacologic comfort measures Outcome: Not Progressing

## 2024-02-22 NOTE — Plan of Care (Signed)
 Problem: Education: Goal: Knowledge of General Education information will improve Description: Including pain rating scale, medication(s)/side effects and non-pharmacologic comfort measures Outcome: Progressing   Problem: Health Behavior/Discharge Planning: Goal: Ability to manage health-related needs will improve Outcome: Progressing   Problem: Clinical Measurements: Goal: Ability to maintain clinical measurements within normal limits will improve Outcome: Progressing Goal: Will remain free from infection Outcome: Progressing Goal: Diagnostic test results will improve Outcome: Progressing Goal: Respiratory complications will improve Outcome: Progressing Goal: Cardiovascular complication will be avoided Outcome: Progressing   Problem: Activity: Goal: Risk for activity intolerance will decrease Outcome: Progressing   Problem: Nutrition: Goal: Adequate nutrition will be maintained Outcome: Progressing   Problem: Coping: Goal: Level of anxiety will decrease Outcome: Progressing   Problem: Elimination: Goal: Will not experience complications related to bowel motility Outcome: Progressing Goal: Will not experience complications related to urinary retention Outcome: Progressing   Problem: Pain Managment: Goal: General experience of comfort will improve and/or be controlled Outcome: Progressing   Problem: Safety: Goal: Ability to remain free from injury will improve Outcome: Progressing   Problem: Skin Integrity: Goal: Risk for impaired skin integrity will decrease Outcome: Progressing   Problem: Education: Goal: Understanding of cardiac disease, CV risk reduction, and recovery process will improve Outcome: Progressing Goal: Individualized Educational Video(s) Outcome: Progressing   Problem: Activity: Goal: Ability to tolerate increased activity will improve Outcome: Progressing   Problem: Cardiac: Goal: Ability to achieve and maintain adequate cardiovascular  perfusion will improve Outcome: Progressing   Problem: Health Behavior/Discharge Planning: Goal: Ability to safely manage health-related needs after discharge will improve Outcome: Progressing   Problem: Education: Goal: Understanding of CV disease, CV risk reduction, and recovery process will improve Outcome: Progressing Goal: Individualized Educational Video(s) Outcome: Progressing   Problem: Activity: Goal: Ability to return to baseline activity level will improve Outcome: Progressing   Problem: Cardiovascular: Goal: Ability to achieve and maintain adequate cardiovascular perfusion will improve Outcome: Progressing Goal: Vascular access site(s) Level 0-1 will be maintained Outcome: Progressing   Problem: Health Behavior/Discharge Planning: Goal: Ability to safely manage health-related needs after discharge will improve Outcome: Progressing   Problem: Education: Goal: Will demonstrate proper wound care and an understanding of methods to prevent future damage Outcome: Progressing Goal: Knowledge of disease or condition will improve Outcome: Progressing Goal: Knowledge of the prescribed therapeutic regimen will improve Outcome: Progressing Goal: Individualized Educational Video(s) Outcome: Progressing   Problem: Activity: Goal: Risk for activity intolerance will decrease Outcome: Progressing   Problem: Cardiac: Goal: Will achieve and/or maintain hemodynamic stability Outcome: Progressing   Problem: Clinical Measurements: Goal: Postoperative complications will be avoided or minimized Outcome: Progressing   Problem: Respiratory: Goal: Respiratory status will improve Outcome: Progressing   Problem: Skin Integrity: Goal: Wound healing without signs and symptoms of infection Outcome: Progressing Goal: Risk for impaired skin integrity will decrease Outcome: Progressing   Problem: Urinary Elimination: Goal: Ability to achieve and maintain adequate renal perfusion  and functioning will improve Outcome: Progressing   Problem: Safety: Goal: Non-violent Restraint(s) Outcome: Progressing   Problem: Education: Goal: Ability to describe self-care measures that may prevent or decrease complications (Diabetes Survival Skills Education) will improve Outcome: Progressing Goal: Individualized Educational Video(s) Outcome: Progressing   Problem: Coping: Goal: Ability to adjust to condition or change in health will improve Outcome: Progressing   Problem: Fluid Volume: Goal: Ability to maintain a balanced intake and output will improve Outcome: Progressing   Problem: Health Behavior/Discharge Planning: Goal: Ability to identify and utilize available resources and services  will improve Outcome: Progressing Goal: Ability to manage health-related needs will improve Outcome: Progressing   Problem: Metabolic: Goal: Ability to maintain appropriate glucose levels will improve Outcome: Progressing

## 2024-02-22 NOTE — Progress Notes (Signed)
 [name], MRN:  [id number], DOB:  [birth year], LOS: 21 ADMISSION DATE:  02/01/2024, CONSULTATION DATE:  02/03/24 REFERRING MD:  Sherene Dilling, CHIEF COMPLAINT:  chest pain   History of Present Illness:  68 year old man w/ hx of HTN, obesity who presented on 4/15 with sudden onset chest pain.  EKG showing inferior STEMI and went to cath lab for RCA DES x 3.  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  Taken emergently to OR for Bentall+ ascending/arch replacement.  Procedure complicated by coagulopathy related to antiplatelet therapy from recent stent, multiple liters of blood products given.  Returned to ICU 4/16 on vent and PCCM consulted to assist with vent wean on 4/17.  Currently heavily sedated; attempts at weaning resulted in agitation without ability to redirect.  Pertinent  Medical History   Past Medical History:  Diagnosis Date   Hypertension     Significant Hospital Events: Including procedures, antibiotic start and stop dates in addition to other pertinent events   4/16 RCA stent then Bentall, aortic arch replacement 4/18 extubated 4/19 reintubated in afternoon for aspiration, bronch, Delirious, low grade fevers, expressive aphasia 4/20 but mental status precludes extubation 4/21 started spiking fever again, meropenem  switched back from Zosyn , on vancomycin  as well 02/07/22 failed spontaneous breathing trial 4/24 Tachypnea, fever. Resp culture sent  4/25 underwent right heart cath showing normal filling pressure 4/26 remained in A-fib, became afebrile, Precedex  was switched to low-dose propofol  5/2 trach 5/3 ongoing fevers, leukocytosis; vanc/meropenem  started; CRRT held 5/4 HD catheter removed for ongoing leukocytosis  Interim History / Subjective:  Vent last night to rest Excellent UOP  Objective   Blood pressure (!) 119/54, pulse 69, temperature 97.9 F (36.6 C), temperature  source Oral, resp. rate (!) 22, height 6\' 1"  (1.854 m), weight 120.8 kg, SpO2 99%. CVP:  [7 mmHg-55 mmHg] 22 mmHg  Vent Mode: PRVC FiO2 (%):  [40 %-50 %] 40 % Set Rate:  [20 bmp] 20 bmp Vt Set:  [570 mL] 570 mL PEEP:  [8 cmH20] 8 cmH20 Plateau Pressure:  [23 cmH20-26 cmH20] 23 cmH20   Intake/Output Summary (Last 24 hours) at 02/22/2024 4098 Last data filed at 02/22/2024 1191 Gross per 24 hour  Intake 2436.19 ml  Output 5570 ml  Net -3133.81 ml   Filed Weights   02/20/24 0630 02/21/24 0500 02/22/24 0500  Weight: 116.8 kg 118.2 kg 120.8 kg    Examination: Chronically ill Having some pain Follows commands Profoundly weak Still has anasarca On vent for rest at night  BUN/Cr drifting up but still reasonable EF improved on echo, still e/o volume overload  Resolved Hospital Problem list   Constipation Shock combined cardiogenic/septic Hypokalemia/hypophosphatemia Shock liver, improving Perioperative coagulopathy  Ileus, resolved- off reglan  Constipation, resolved  Assessment & Plan:  Acute inferior wall STEMI status post PCI and DES 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 secretions Acute kidney injury due to ischemic ATN from shock, stable CRRT held 5/3; no immediate need to restart at this time; primarily done for uremia Acute encephalopathy, likely septic and uremia. Improving slowly; waxing waning; may benefit from MRI at some point Perioperative acute blood loss anemia- stable; now just related  to bone marrow stunning and phlebotomy Obesity Leukocytosis- still an issue; slightly better today Uncontrolled hyperglycemia; A1c 5.3 Hypokalemia Stage 1 sacral  ulcer LLE DVT- on heparin  gtt  - at bedtime vent for now - continue diuresis; I think we can hold on HD unless uremia becomes recurrent issues - check CBC -turning, pain/anxiety/delirium meds as ordered - unna unilaterally (DVT on other side) - continue AC pending surgical plans - will do brief GOC and discuss PEG with wife; missed her yesterday - work toward [facility] maybe by end of week if wife agreeable  Best Practice (right click and "Reselect all SmartList Selections" daily)   Diet/type: Tube feeds DVT prophylaxis heparin  gtt Pressure ulcer(s): yes, see nursing documentation GI prophylaxis: PPI Lines: Central line picc Foley:  Yes, and it is still needed Code Status:  full code Last date of multidisciplinary goals of care discussion: 5/4 updated at bedside  34 min cc time  Dustin Nigh, MD 02/22/24 7:07 AM [facility]  For contact information, see Amion. If no response to pager, please call PCCM consult pager. After hours, 7PM- 7AM, please call Elink.

## 2024-02-22 NOTE — Progress Notes (Signed)
 eLink Physician-Brief Progress Note [name] DOB: 1955/12/21 MRN: [id number]   Date of Service  02/22/2024  HPI/Events of Note  K 3.1  eICU Interventions  Kcl     Intervention Category Minor Interventions: Electrolytes abnormality - evaluation and management  [name] 02/22/2024, 4:42 AM

## 2024-02-22 NOTE — TOC Progression Note (Signed)
 Transition of Care [facility]) - Progression Note    Patient Details  Name: KYRION MORLES MRN: [id number] Date of Birth: 1955-11-19  [facility] CM/SW Contact  Benjiman Bras, RN Phone Number: [phone] 02/22/2024, 2:35 PM  Clinical Narrative:     TOC CM spoke to pt's wife and offered choice for LTAC. Medicare.gov list with ratings provided and placed on chart. Wife wanted [facility]. Referral sent to [facility] rep, Ciera for follow up. Will follow up with attending for dc readiness to LTAC.   Expected Discharge Plan: Long Term Acute Care (LTAC) Barriers to Discharge: Continued Medical Work up  Expected Discharge Plan and Services   Discharge Planning Services: CM Consult Post Acute Care Choice: Long Term Acute Care (LTAC) Living arrangements for the past 2 months: Single Family Home                                       Social Determinants of Health (SDOH) Interventions SDOH Screenings   Food Insecurity: No Food Insecurity (02/15/2024)  Housing: Low Risk  (02/15/2024)  Transportation Needs: No Transportation Needs (02/15/2024)  Utilities: Not At Risk (02/15/2024)  Social Connections: Patient Unable To Answer (02/02/2024)  Tobacco Use: Unknown (02/15/2024)    Readmission Risk Interventions     No data to display

## 2024-02-22 NOTE — Progress Notes (Addendum)
 Advanced Heart Failure Rounding Note  Cardiologist: None   Chief Complaint: STEMI> Cardiogenic shock> Now s/p aortic dissection repair + Bentall  Patient Profile   Paula R Gargus is a 68 y.o. male with HTN and HLD. Admitted with STEMI now with CGS. Found to have AAA and taken emergently to the OR. Now s/p Bentall/AVR and reimplantation of coronaries.  Significant events:    - Echo 4/15: AAA measuring 5-5.7 cm. Followed by emergent TEE 4/15 which was significant for torrential aortic regurgitation and dissection flap extending from the aortic root to the proximal ascending aorta  - CT C/A/P: Showed acute type A dissection with a 5.5 cm ascending aortic aneurysm extending into arch. Dissection goes down to infrarenal aorta and extends up into innominate and left common carotid artery - 4/16 Underwent aortic dissection repair (Bentall/AVR and reimplantation of coronaries. Intra-op course c/b coagulopathy due to Effient  (PCI of RCA earlier in the day)).  - Extubated 4/18 - Re-intubated 4/19. Bronch with diffuse mucopurulent secretions - 4/26 Afib with post conversion pause. Amiodarone  decreased.  - 4/28 Started back on  Vanc + Meropenum and Micafungin  added 4/28 per CCM. Resp Cx + Candida   - 4/28 LLE Venous Doppler +DVT (gastroc vein), LUE Venous Doppler + for superficial cephalic vein thrombosis  -5/1 CT head. No acute findings. Started on CRRT.  -No acute events overnight.  -5/3 CRRT stopped.  -5/5 Diuresed with IV lasix . Echo EF 40% RV normal RA moderately dilated.IVC dilated.    Subjective:    Yesterday diuresed with IV lasix . Negative 3 liters.    Remains on Vaso 0.03 units + Norepi  mcg.   No issues overnight.   Objective:    Weight Range: 120.8 kg Body mass index is 35.14 kg/m.   Vital Signs:   Temp:  [97.6 F (36.4 C)-97.9 F (36.6 C)] 97.9 F (36.6 C) (05/05 2356) Pulse Rate:  [53-110] 69 (05/06 0600) Resp:  [16-43] 22 (05/06 0600) BP: (89-144)/(41-86) 119/54  (05/06 0600) SpO2:  [88 %-100 %] 99 % (05/06 0600) FiO2 (%):  [40 %-50 %] 40 % (05/06 0127) Weight:  [120.8 kg] 120.8 kg (05/06 0500) Last BM Date : 02/21/24  Weight change: Filed Weights   02/20/24 0630 02/21/24 0500 02/22/24 0500  Weight: 116.8 kg 118.2 kg 120.8 kg   Intake/Output:  Intake/Output Summary (Last 24 hours) at 02/22/2024 0717 Last data filed at 02/22/2024 0454 Gross per 24 hour  Intake 2436.19 ml  Output 5570 ml  Net -3133.81 ml  CVP19-20   Physical Exam  General:   No resp difficulty Neck: supple. JVD difficult to assess. Trach Cor: PMI nondisplaced. Regular rate & rhythm. No rubs, gallops or murmurs. Lungs: clear Abdomen: soft, nontender, nondistended.  Extremities: no cyanosis, clubbing, rash, edema Neuro: alert & oriented x3  Telemetry   SR with PACs.   Labs    CBC Recent Labs    02/20/24 0414 02/20/24 1545 02/21/24 0441  WBC 29.7*  --  27.1*  HGB 6.9* 8.0* 8.0*  HCT 23.4* 26.8* 26.4*  MCV 92.5  --  91.3  PLT 615*  --  612*   Basic Metabolic Panel Recent Labs    09/81/19 0414 02/20/24 1545 02/21/24 0441 02/21/24 1543 02/22/24 0327  NA 136   < > 139 143 144  K 3.4*   < > 3.6 3.5 3.1*  CL 105   < > 108 104 107  CO2 23   < > 22 20* 21*  GLUCOSE 257*   < >  179* 130* 151*  BUN 69*   < > 97* 90* 108*  CREATININE 1.33*   < > 1.64* 1.54* 1.58*  CALCIUM  7.7*   < > 8.2* 8.0* 9.1  MG 2.7*  --  2.8*  --   --   PHOS 3.8   < > 6.0*  --  5.3*   < > = values in this interval not displayed.   Liver Function Tests Recent Labs    02/19/24 1336 02/19/24 1704 02/21/24 0438 02/21/24 0441 02/22/24 0327  AST 346*  --  289*  --   --   ALT 124*  --  141*  --   --   ALKPHOS 108  --  145*  --   --   BILITOT 4.1*  --  0.9  --   --   PROT 4.7*  --  6.2*  --   --   ALBUMIN  1.5*   < > 1.9* 1.9* 3.0*   < > = values in this interval not displayed.   D-Dimer No results for input(s): "DDIMER" in the last 72 hours.  Hemoglobin A1C No results for  input(s): "HGBA1C" in the last 72 hours.  Medications:    Scheduled Medications:  sodium chloride    Intravenous Once   acetaminophen   650 mg Per Tube Q6H   amiodarone   400 mg Per Tube BID   Chlorhexidine  Gluconate Cloth  6 each Topical Daily   clonazePAM   0.5 mg Per Tube BID   clopidogrel   75 mg Oral Daily   feeding supplement (PROSource TF20)  60 mL Per Tube QID   furosemide   80 mg Intravenous BID   Gerhardt's butt cream   Topical Daily   insulin  aspart  0-20 Units Subcutaneous Q4H   insulin  aspart  5 Units Subcutaneous Q4H   insulin  glargine-yfgn  20 Units Subcutaneous BID   midodrine  5 mg Per Tube TID WC   multivitamin  1 tablet Oral QHS   mouth rinse  15 mL Mouth Rinse Q2H   pantoprazole  (PROTONIX ) IV  40 mg Intravenous QHS   QUEtiapine   50 mg Per Tube BID   rosuvastatin   40 mg Oral Daily   scopolamine  1 patch Transdermal Q72H   sodium chloride  flush  10-40 mL Intracatheter Q12H   Infusions:  feeding supplement (VITAL 1.5 CAL) 45 mL/hr at 02/22/24 0600   heparin  2,000 Units/hr (02/22/24 0600)   meropenem  (MERREM ) IV Stopped (02/22/24 0552)   norepinephrine  (LEVOPHED ) Adult infusion 3 mcg/min (02/22/24 0600)   vancomycin  Stopped (02/21/24 1256)   vasopressin  0.03 Units/min (02/22/24 0600)   PRN Medications: albuterol , HYDROmorphone (DILAUDID) injection, ondansetron  (ZOFRAN ) IV, mouth rinse, oxyCODONE , polyvinyl alcohol , sodium chloride  flush   Assessment/Plan   Septic/vasoplegic shock - Now vanc/merrem ; remains on micafungin .  - Discussed with CCM - May need repeat CT; order repeating LFTs to assess for GB etiology.  - CBC pending.  - On vancomycin  + meropenum.   Acute type A aortic dissection  - Echo 4/15: AAA measuring 5-5.7 cm - Emergent TEE 4/15: significant for torrential aortic regurgitation and dissection flap extending from the aortic root to the proximal ascending aorta  - CT C/A/P: Showed acute type A dissection with a 5.5 cm ascending aortic aneurysm  extending into arch. Dissection goes down to infrarenal aorta and extends up into innominate and left common carotid arteries  - Now s/p aortic dissection repair 02/01/24 (Bentall/AVR and reimplantation of coronaries. - stable.   2. CAD, Acute inferior STEMI - Admitted with  STEMI. Post PCI found to also have aortic dissection - LHC with single vessel CAD involving the mid to distal RCA, successful PCI of the RCA with overlapping DES x 3. - No aspirin  with heparin  gtt - Continue plavix  75mg  daily - Continue Crestor  40 mg daily - no changes  3. Acute systolic HF ->  Cardiogenic shock - Post perfusion CGS.  - Echo 4/19: EF 25% RV moderate to severely decreased - Echo 5/5: EF 40% RV normal RA moderately dilated.IVC dilated.  -CVP 19-20 . Continue to diurese.  -Remains norepi 3 + vaso 0.03. CO-OX 71% -Continue lasix  80 mg twice a day. Supp K.   4. Post-op hypoxic resp failure - extubated 4/18.  - re-intubated 4/19 - s/p tracheostomy. Fio2 40%.  - diffuse mucopurulent secretions on bronch 4/19. BAL GS: Mod WBC. Few gram variable rods. Rare yeast - CT C/A/P 4/27 w/ small postoperative seroma around aortic root/graft and possible PNA.  - RespCx 4/28 + Candida. Completed  micafungin  5/4 - restarting broad spec abx.   5. PAF with post-termination pauses PVCs - Patient with AF intra-op. Back in SR today. .  - Previously on IV amio. Continue amio 400 mg twice a day  - continue heparin  gtt   6. Hypokalemia/hypernatremia -Sodium stable - K 3.1.  Supp K     7. AKI due to ATN/shock - Baseline SCr ~1.3-1.4 - CRRT Stopped 5/3. BUN up to 108   -Per Nephrology may need to restart tomorrow.   8. ABLA post-op - Received 6uPRBCs, 6 plt, 4 FFP and 5 cryo intra-op.  - CBC pending.  - Transfuse Hgb < 7.0   9. Acute Venous Thrombosis  - +LLE DVT (gastroc vein) - + LUE superficial cephalic venin thrombosis - heparin  gtt until completion of invasive procedures (anticipate likely trach later in  the week)   10. Encephalopathy  -- CT head no acute findings.    CRITICAL CARE Performed by: Nieves Bars NP-C   Total critical care time: 10 minutes  Critical care time was exclusive of separately billable procedures and treating other patients.  Critical care was necessary to treat or prevent imminent or life-threatening deterioration.  Critical care was time spent personally by me on the following activities: development of treatment plan with patient and/or surrogate as well as nursing, discussions with consultants, evaluation of patient's response to treatment, examination of patient, obtaining history from patient or surrogate, ordering and performing treatments and interventions, ordering and review of laboratory studies, ordering and review of radiographic studies, pulse oximetry and re-evaluation of patient's condition.    Length of Stay: 21  Amy Marijane Shoulders, NP  02/22/2024, 7:17 AM  Advanced Heart Failure Team Pager [phone] (M-F; 7a - 5p)  Please contact [facility] for night-coverage after hours (5p -7a ) and weekends on [email or web address]  Patient seen with NP, I formulated the plan and agree with the above note.   Echo from yesterday reviewed, EF 40% with mild LVH, RV normal function, bioprosthetic aortic valve stable, IVC dilated.    I/Os net negative 3134 with IV Lasix . CVP 16.  BUN higher but creatinine stable.  Co-ox 71%.  He has been in and out of atrial fibrillation.  Off NE and on vasopressin  0.03.   General: NAD Neck: JVP 14-16 cm, no thyromegaly or thyroid nodule.  Lungs: Clear to auscultation bilaterally with normal respiratory effort. CV: Nondisplaced PMI.  Heart irregular S1/S2, no S3/S4, 1/6 SEM.  Trace ankle edema.  Abdomen: Soft, nontender, no hepatosplenomegaly, no  distention.  Skin: Intact without lesions or rashes.  Neurologic: Alert and oriented x 3.  Psych: Normal affect. Extremities: No clubbing or cyanosis.  HEENT: Normal.   Good UOP and stable  creatinine but BUN rising. CVP 16.  Continue Lasix  80 mg IV bid, no urgent need for HD.  Nephrology following.     Good co-ox at 17%,  now off NE and on vasopression 0.03. Echo with EF 40%, normal RV function, stable bioprosthetic aortic valve, dilated IVC.  - Wean off vasopressin  as tolerated.    Trach in place, on vancomycin /meropenem  for empiric PNA coverage.    He has been in and out of AF.  Stop po amiodarone  and restart gtt at 30 mg/hr.  Continue heparin  gtt.    Continue Plavix  and Crestor  with recent PCI.   To get MRI head today with encephalopathy.   CRITICAL CARE Performed by: Peder Bourdon  Total critical care time: 40 minutes  Critical care time was exclusive of separately billable procedures and treating other patients.  Critical care was necessary to treat or prevent imminent or life-threatening deterioration.  Critical care was time spent personally by me on the following activities: development of treatment plan with patient and/or surrogate as well as nursing, discussions with consultants, evaluation of patient's response to treatment, examination of patient, obtaining history from patient or surrogate, ordering and performing treatments and interventions, ordering and review of laboratory studies, ordering and review of radiographic studies, pulse oximetry and re-evaluation of patient's condition.  Peder Bourdon 02/22/2024 11:10 AM

## 2024-02-22 NOTE — Progress Notes (Deleted)
 PHARMACY - ANTICOAGULATION CONSULT NOTE  Pharmacy Consult for UFH IV Infusion Indication: atrial fibrillation / LLE DVT  Allergies  Allergen Reactions   Bee Venom     Patient Measurements: Height: 6\' 1"  (185.4 cm) Weight: 120.8 kg (266 lb 5.1 oz) IBW/kg (Calculated) : 79.9 HEPARIN  DW (KG): 106  Vital Signs: Temp: 97.9 F (36.6 C) (05/05 2356) Temp Source: Oral (05/05 2356) BP: 119/54 (05/06 0600) Pulse Rate: 69 (05/06 0600)  Labs: Recent Labs    02/20/24 0414 02/20/24 1545 02/21/24 0441 02/21/24 1543 02/22/24 0327  HGB 6.9* 8.0* 8.0*  --   --   HCT 23.4* 26.8* 26.4*  --   --   PLT 615*  --  612*  --   --   HEPARINUNFRC 0.28*  --  0.24*  --  0.32  CREATININE 1.33* 1.44* 1.64* 1.54* 1.58*    Estimated Creatinine Clearance: 60.9 mL/min (A) (by C-G formula based on SCr of 1.58 mg/dL (H)).   Medical History: Past Medical History:  Diagnosis Date   Hypertension     Medications:   Infusions:   feeding supplement (VITAL 1.5 CAL) 45 mL/hr at 02/22/24 0600   heparin  2,000 Units/hr (02/22/24 0600)   meropenem  (MERREM ) IV Stopped (02/22/24 0552)   norepinephrine  (LEVOPHED ) Adult infusion 3 mcg/min (02/22/24 0600)   vancomycin  Stopped (02/21/24 1256)   vasopressin  0.03 Units/min (02/22/24 0600)   PRN: albuterol , HYDROmorphone (DILAUDID) injection, ondansetron  (ZOFRAN ) IV, mouth rinse, oxyCODONE , polyvinyl alcohol , sodium chloride  flush    Assessment: Dustin Davenport is 68 y.o. male with PMH HTN and HLD. Admitted with STEMI now with CGS. Found to have AAA requiring emergent surgical repair. Now s/p Bentall/AVR.  Pharmacy asked to start IV heparin  4/27 for afib.  LLE DVT and superficial thrombus in LUE found on doppler 4/28.  Heparin  level is therapeutic at 0.32, on heparin  infusion at 2000 units/hr. Hgb 7.2, plt 578. No s/sx of bleeding or infusion issues.   Goal of Therapy:  Heparin  level 0.3-0.5 units/ml Monitor platelets by anticoagulation protocol: Yes   Plan:   Continue Heparin  infusion at 2000 units/hr Daily heparin  level and CBC. Monitor s/s bleeding.  Thank you for allowing pharmacy to participate in this patient's care,  Nieves Bars, PharmD, BCCCP Clinical Pharmacist  Phone: [phone] 02/22/2024 7:30 AM  Please check AMION for all [facility] phone numbers After 10:00 PM, call Main Pharmacy [phone]

## 2024-02-22 NOTE — Progress Notes (Signed)
 PHARMACY ANTIBIOTIC CONSULT NOTE   Dustin Davenport a 68 y.o. male admitted on 4/15 s/p type A dissection and Bentall repair and PCI/DES to RCA. Patient had been on broad spectrum antibiotics with meropenem >zoysn wbc increased and changed back to meropenem  4/24 - stopped 4/30> restarted 5/3 vancomycin  stopped 5/1 restarted 5/3> with increased PCT, increased wbc 30 >27 and low grade fever  Tm99 Cxr with opacity RLL.  Cr 1.6 now off CRRT Vancomycin  1500mg  q24h with VT 25 drawn correctly today  goal 15-20 -micafungin  4/30>5/4 - yeast in TA  Plan: Meropenem  1gm IV q8h Decrease vancomycin  1gm q24 Stop dates in for 7d empiric ABX  Monitor renal function, clinical status, de-escalation, C/S, levels as indicated   Allergies:  Allergies  Allergen Reactions   Bee Venom     Filed Weights   02/20/24 0630 02/21/24 0500 02/22/24 0500  Weight: 116.8 kg (257 lb 8 oz) 118.2 kg (260 lb 9.3 oz) 120.8 kg (266 lb 5.1 oz)       Latest Ref Rng & Units 02/22/2024    8:01 AM 02/21/2024    4:41 AM 02/20/2024    3:45 PM  CBC  WBC 4.0 - 10.5 K/uL 19.2  27.1    Hemoglobin 13.0 - 17.0 g/dL 7.2  8.0  8.0   Hematocrit 39.0 - 52.0 % 24.6  26.4  26.8   Platelets 150 - 400 K/uL 578  612      Antibiotics Given (last 72 hours)     Date/Time Action Medication Dose Rate   02/19/24 1251 New Bag/Given   vancomycin  (VANCOREADY) IVPB 2000 mg/400 mL 2,000 mg 200 mL/hr   02/19/24 1533 New Bag/Given   meropenem  (MERREM ) 1 g in sodium chloride  0.9 % 100 mL IVPB 1 g 200 mL/hr   02/19/24 2146 New Bag/Given   meropenem  (MERREM ) 1 g in sodium chloride  0.9 % 100 mL IVPB 1 g 200 mL/hr   02/20/24 0514 New Bag/Given   meropenem  (MERREM ) 1 g in sodium chloride  0.9 % 100 mL IVPB 1 g 200 mL/hr   02/20/24 1054 New Bag/Given   vancomycin  (VANCOREADY) IVPB 1500 mg/300 mL 1,500 mg 150 mL/hr   02/20/24 1431 Started During Downtime   meropenem  (MERREM ) 1 g in sodium chloride  0.9 % 100 mL IVPB 1 g 200 mL/hr   02/20/24 2230 New Bag/Given    meropenem  (MERREM ) 1 g in sodium chloride  0.9 % 100 mL IVPB 1 g 200 mL/hr   02/21/24 0600 New Bag/Given   meropenem  (MERREM ) 1 g in sodium chloride  0.9 % 100 mL IVPB 1 g 200 mL/hr   02/21/24 1055 New Bag/Given   vancomycin  (VANCOREADY) IVPB 1500 mg/300 mL 1,500 mg 150 mL/hr   02/21/24 1428 New Bag/Given   meropenem  (MERREM ) 1 g in sodium chloride  0.9 % 100 mL IVPB 1 g 200 mL/hr   02/21/24 2205 New Bag/Given   meropenem  (MERREM ) 1 g in sodium chloride  0.9 % 100 mL IVPB 1 g 200 mL/hr   02/22/24 0521 New Bag/Given   meropenem  (MERREM ) 1 g in sodium chloride  0.9 % 100 mL IVPB 1 g 200 mL/hr       Antimicrobials this admission: Ancef  periop 4/15 - 4/17 Vanc 4/19 >4/25 restart 4/28 >4/30 restarted 5/3> Merrem  4/19 > 4/21 restarted 4/24>4/30, rstarted 5/3> Zosyn  4/21 >4/23  Micafungin  4/28> 5/4  Microbiology results: 4/15 MRSA: neg (surgical PCR +MSSA) 4/19 BAL: gm variable rod, rare yeast pseudohyphae 4/19 MRSA: neg SA + 4/24 TA candida albicans  [name] Pharm.D. CPP, BCPS Clinical Pharmacist [phone] 02/22/2024 11:31 AM   Please check AMION for all [facility] phone numbers After 10:00 PM, call Main Pharmacy [phone]

## 2024-02-22 NOTE — Progress Notes (Signed)
 SLP Cancellation Note  Patient Details Name: Dustin Davenport MRN: [id number] DOB: 09-13-1956   Cancelled treatment:       Reason Eval/Treat Not Completed: Patient preparing to go to MRI with sedation. Will continue efforts to evaluate with PMV>   Lesean Woolverton L. Beatris Lincoln, MA CCC/SLP Clinical Specialist - Acute Care SLP Acute Rehabilitation Services Office number [phone]   Myna Asal [name] 02/22/2024, 1:10 PM

## 2024-02-22 NOTE — Progress Notes (Signed)
 Pt transported to [facility] via vent with no complications noted. Pt returned to 2H via vent, ICU RT notified of his return.

## 2024-02-23 ENCOUNTER — Inpatient Hospital Stay (HOSPITAL_COMMUNITY)

## 2024-02-23 DIAGNOSIS — I7101 Dissection of ascending aorta: Secondary | ICD-10-CM | POA: Diagnosis not present

## 2024-02-23 DIAGNOSIS — N179 Acute kidney failure, unspecified: Secondary | ICD-10-CM

## 2024-02-23 DIAGNOSIS — I2111 ST elevation (STEMI) myocardial infarction involving right coronary artery: Secondary | ICD-10-CM | POA: Diagnosis not present

## 2024-02-23 DIAGNOSIS — I5020 Unspecified systolic (congestive) heart failure: Secondary | ICD-10-CM | POA: Diagnosis not present

## 2024-02-23 DIAGNOSIS — I480 Paroxysmal atrial fibrillation: Secondary | ICD-10-CM | POA: Diagnosis not present

## 2024-02-23 DIAGNOSIS — E876 Hypokalemia: Secondary | ICD-10-CM | POA: Diagnosis not present

## 2024-02-23 DIAGNOSIS — J9601 Acute respiratory failure with hypoxia: Secondary | ICD-10-CM | POA: Diagnosis not present

## 2024-02-23 LAB — RENAL FUNCTION PANEL
Albumin: 2.7 g/dL — ABNORMAL LOW (ref 3.5–5.0)
Albumin: 1.9 g/dL — ABNORMAL LOW (ref 3.5–5.0)
Albumin: 2.5 g/dL — ABNORMAL LOW (ref 3.5–5.0)
Anion gap: 10 (ref 5–15)
Anion gap: 11 (ref 5–15)
Anion gap: 9 (ref 5–15)
BUN: 126 mg/dL — ABNORMAL HIGH (ref 8–23)
BUN: 78 mg/dL — ABNORMAL HIGH (ref 8–23)
BUN: 96 mg/dL — ABNORMAL HIGH (ref 8–23)
CO2: 27 mmol/L (ref 22–32)
CO2: 20 mmol/L — ABNORMAL LOW (ref 22–32)
CO2: 26 mmol/L (ref 22–32)
Calcium: 9.2 mg/dL (ref 8.9–10.3)
Calcium: 7 mg/dL — ABNORMAL LOW (ref 8.9–10.3)
Calcium: 8.7 mg/dL — ABNORMAL LOW (ref 8.9–10.3)
Chloride: 110 mmol/L (ref 98–111)
Chloride: 88 mmol/L — ABNORMAL LOW (ref 98–111)
Chloride: 108 mmol/L (ref 98–111)
Creatinine, Ser: 1.6 mg/dL — ABNORMAL HIGH (ref 0.61–1.24)
Creatinine, Ser: 1.2 mg/dL (ref 0.61–1.24)
Creatinine, Ser: 1.18 mg/dL (ref 0.61–1.24)
GFR, Estimated: 47 mL/min — ABNORMAL LOW (ref >60)
GFR, Estimated: >60 mL/min (ref >60)
GFR, Estimated: >60 mL/min (ref >60)
Glucose, Bld: 98 mg/dL (ref 70–99)
Glucose, Bld: 910 mg/dL (ref 70–99)
Glucose, Bld: 130 mg/dL — ABNORMAL HIGH (ref 70–99)
Phosphorus: 6.5 mg/dL — ABNORMAL HIGH (ref 2.5–4.6)
Phosphorus: 3.6 mg/dL (ref 2.5–4.6)
Phosphorus: 4.2 mg/dL (ref 2.5–4.6)
Potassium: 3.6 mmol/L (ref 3.5–5.1)
Potassium: 2.9 mmol/L — ABNORMAL LOW (ref 3.5–5.1)
Potassium: 3.7 mmol/L (ref 3.5–5.1)
Sodium: 147 mmol/L — ABNORMAL HIGH (ref 135–145)
Sodium: 119 mmol/L — CL (ref 135–145)
Sodium: 143 mmol/L (ref 135–145)

## 2024-02-23 LAB — CBC
HCT: 26.1 % — ABNORMAL LOW (ref 39.0–52.0)
HCT: 27.1 % — ABNORMAL LOW (ref 39.0–52.0)
Hemoglobin: 7.5 g/dL — ABNORMAL LOW (ref 13.0–17.0)
Hemoglobin: 8.1 g/dL — ABNORMAL LOW (ref 13.0–17.0)
MCH: 26.6 pg (ref 26.0–34.0)
MCH: 26.8 pg (ref 26.0–34.0)
MCHC: 28.7 g/dL — ABNORMAL LOW (ref 30.0–36.0)
MCHC: 29.9 g/dL — ABNORMAL LOW (ref 30.0–36.0)
MCV: 92.6 fL (ref 80.0–100.0)
MCV: 89.7 fL (ref 80.0–100.0)
Platelets: 569 10*3/uL — ABNORMAL HIGH (ref 150–400)
Platelets: 540 10*3/uL — ABNORMAL HIGH (ref 150–400)
RBC: 2.82 MIL/uL — ABNORMAL LOW (ref 4.22–5.81)
RBC: 3.02 MIL/uL — ABNORMAL LOW (ref 4.22–5.81)
RDW: 20.2 % — ABNORMAL HIGH (ref 11.5–15.5)
RDW: 19.5 % — ABNORMAL HIGH (ref 11.5–15.5)
WBC: 17 10*3/uL — ABNORMAL HIGH (ref 4.0–10.5)
WBC: 13.1 10*3/uL — ABNORMAL HIGH (ref 4.0–10.5)
nRBC: 22.2 % — ABNORMAL HIGH (ref 0.0–0.2)
nRBC: 17.5 % — ABNORMAL HIGH (ref 0.0–0.2)

## 2024-02-23 LAB — COOXEMETRY PANEL
Carboxyhemoglobin: 1.2 % (ref 0.5–1.5)
Methemoglobin: <0.7 % (ref 0.0–1.5)
O2 Saturation: 77.4 %
Total hemoglobin: 6.9 g/dL — CL (ref 12.0–16.0)

## 2024-02-23 LAB — HEPARIN LEVEL (UNFRACTIONATED): Heparin Unfractionated: 0.55 [IU]/mL (ref 0.30–0.70)

## 2024-02-23 LAB — GLUCOSE, CAPILLARY
Glucose-Capillary: 107 mg/dL — ABNORMAL HIGH (ref 70–99)
Glucose-Capillary: 112 mg/dL — ABNORMAL HIGH (ref 70–99)
Glucose-Capillary: 112 mg/dL — ABNORMAL HIGH (ref 70–99)
Glucose-Capillary: 121 mg/dL — ABNORMAL HIGH (ref 70–99)
Glucose-Capillary: 125 mg/dL — ABNORMAL HIGH (ref 70–99)
Glucose-Capillary: 139 mg/dL — ABNORMAL HIGH (ref 70–99)
Glucose-Capillary: 191 mg/dL — ABNORMAL HIGH (ref 70–99)

## 2024-02-23 LAB — PREPARE RBC (CROSSMATCH)

## 2024-02-23 MED ORDER — POTASSIUM CHLORIDE 20 MEQ PO PACK
40.0000 meq | PACK | Freq: Once | ORAL | Status: AC
  Administered 2024-02-23: 40 meq
  Filled 2024-02-23: qty 2

## 2024-02-23 MED ORDER — PRISMASOL BGK 4/2.5 32-4-2.5 MEQ/L EC SOLN
Status: DC
Start: 2024-02-23 — End: 2024-02-27

## 2024-02-23 MED ORDER — SODIUM CHLORIDE 0.9% IV SOLUTION: Freq: Once | INTRAVENOUS | Status: AC

## 2024-02-23 MED ORDER — FENTANYL CITRATE PF 50 MCG/ML IJ SOSY
PREFILLED_SYRINGE | INTRAMUSCULAR | Status: AC
  Administered 2024-02-23: 100 ug
  Filled 2024-02-23: qty 2

## 2024-02-23 MED ORDER — SODIUM CHLORIDE 0.9 % IV SOLN: 500.0000 [IU]/h | INTRAVENOUS | Status: DC

## 2024-02-23 MED ORDER — QUETIAPINE FUMARATE 50 MG PO TABS
50.0000 mg | ORAL_TABLET | Freq: Every day | ORAL | Status: DC
  Administered 2024-02-23 – 2024-02-24 (×2): 50 mg
  Filled 2024-02-23 (×2): qty 1

## 2024-02-23 MED ORDER — FREE WATER
300.0000 mL | Status: DC
  Administered 2024-02-23 – 2024-02-24 (×5): 300 mL

## 2024-02-23 MED ORDER — HEPARIN (PORCINE) 2000 UNITS/L FOR CRRT: INTRAVENOUS_CENTRAL | Status: DC | PRN

## 2024-02-23 MED ORDER — HEPARIN SODIUM (PORCINE) 1000 UNIT/ML DIALYSIS
1000.0000 [IU] | INTRAMUSCULAR | Status: DC | PRN
  Administered 2024-02-24: 2800 [IU] via INTRAVENOUS_CENTRAL
  Filled 2024-02-23: qty 1
  Filled 2024-02-23: qty 4
  Filled 2024-02-23: qty 6
  Filled 2024-02-23: qty 3

## 2024-02-23 MED ORDER — OXYCODONE HCL 5 MG PO TABS
5.0000 mg | ORAL_TABLET | Freq: Three times a day (TID) | ORAL | Status: DC
  Administered 2024-02-23 – 2024-02-27 (×12): 5 mg
  Filled 2024-02-23 (×12): qty 1

## 2024-02-23 MED ORDER — PRISMASOL BGK 4/2.5 32-4-2.5 MEQ/L EC SOLN: Status: DC

## 2024-02-23 NOTE — Progress Notes (Addendum)
 Advanced Heart Failure Rounding Note  Cardiologist: None   Chief Complaint: STEMI> Cardiogenic shock> Now s/p aortic dissection repair + Bentall  Patient Profile   Dustin Davenport is a 68 y.o. male with HTN and HLD. Admitted with STEMI now with CGS. Found to have AAA and taken emergently to the OR. Now s/p Bentall/AVR and reimplantation of coronaries.  Significant events:    - Echo 4/15: AAA measuring 5-5.7 cm. Followed by emergent TEE 4/15 which was significant for torrential aortic regurgitation and dissection flap extending from the aortic root to the proximal ascending aorta  - CT C/A/P: Showed acute type A dissection with a 5.5 cm ascending aortic aneurysm extending into arch. Dissection goes down to infrarenal aorta and extends up into innominate and left common carotid artery - 4/16 Underwent aortic dissection repair (Bentall/AVR and reimplantation of coronaries. Intra-op course c/b coagulopathy due to Effient  (PCI of RCA earlier in the day)).  - Extubated 4/18 - Re-intubated 4/19. Bronch with diffuse mucopurulent secretions - 4/26 Afib with post conversion pause. Amiodarone  decreased.  - 4/28 Started back on  Vanc + Meropenum and Micafungin  added 4/28 per CCM. Resp Cx + Candida   - 4/28 LLE Venous Doppler +DVT (gastroc vein), LUE Venous Doppler + for superficial cephalic vein thrombosis  -5/1 CT head. No acute findings. Started on CRRT.  -No acute events overnight.  -5/3 CRRT stopped.  -5/5 Diuresed with IV lasix . Echo EF 40% RV normal RA moderately dilated.IVC dilated. - 5/6 MRI Brain - with acute infarct left parietal junction, subacute infarctions left occipital lobes.   Subjective:    Remains on Vaso 0.03 units   BUN up to 126.   Drowsy.   Objective:    Weight Range: 114.8 kg Body mass index is 33.39 kg/m.   Vital Signs:   Temp:  [98.3 F (36.8 C)-99.6 F (37.6 C)] 98.3 F (36.8 C) (05/07 0800) Pulse Rate:  [56-113] 56 (05/07 0806) Resp:  [17-34] 20  (05/07 0806) BP: (88-148)/(50-84) 90/50 (05/07 0415) SpO2:  [90 %-100 %] 100 % (05/07 0806) FiO2 (%):  [50 %] 50 % (05/07 0806) Weight:  [114.8 kg] 114.8 kg (05/07 0415) Last BM Date : 02/22/24  Weight change: Filed Weights   02/21/24 0500 02/22/24 0500 02/23/24 0415  Weight: 118.2 kg 120.8 kg 114.8 kg   Intake/Output:  Intake/Output Summary (Last 24 hours) at 02/23/2024 0922 Last data filed at 02/23/2024 0301 Gross per 24 hour  Intake 1626.34 ml  Output 2885 ml  Net -1258.66 ml  CVP 18-19  Physical Exam  General: Appears weak.  HEENT: normal Neck: supple. JVP difficult to assess. Trach Cor: PMI nondisplaced. Regular rate & rhythm. No rubs, gallops or murmurs. Lungs: clear Abdomen: soft, nontender, nondistended. No hepatosplenomegaly. No bruits or masses. Good bowel sounds. Extremities: no cyanosis, clubbing, rash, R and LLE 2+ edema extending to thights.  Neuro: Drowsy opens eyes.  Telemetry   SR with PACs.   Labs    CBC Recent Labs    02/22/24 0801 02/23/24 0358  WBC 19.2* 17.0*  HGB 7.2* 7.5*  HCT 24.6* 26.1*  MCV 90.8 92.6  PLT 578* 569*   Basic Metabolic Panel Recent Labs    16/10/96 0441 02/21/24 1543 02/22/24 0327 02/22/24 0801 02/23/24 0358  NA 139   < > 144 146* 147*  K 3.6   < > 3.1* 3.4* 3.6  CL 108   < > 107 111 110  CO2 22   < > 21*  23 27  GLUCOSE 179*   < > 151* 130* 98  BUN 97*   < > 108* 101* 126*  CREATININE 1.64*   < > 1.58* 1.55* 1.60*  CALCIUM  8.2*   < > 9.1 8.3* 9.2  MG 2.8*  --   --   --   --   PHOS 6.0*  --  5.3*  --  6.5*   < > = values in this interval not displayed.   Liver Function Tests Recent Labs    02/21/24 0438 02/21/24 0441 02/22/24 0327 02/23/24 0358  AST 289*  --   --   --   ALT 141*  --   --   --   ALKPHOS 145*  --   --   --   BILITOT 0.9  --   --   --   PROT 6.2*  --   --   --   ALBUMIN  1.9*   < > 3.0* 2.7*   < > = values in this interval not displayed.   D-Dimer No results for input(s): "DDIMER" in the  last 72 hours.  Hemoglobin A1C No results for input(s): "HGBA1C" in the last 72 hours.  Medications:    Scheduled Medications:  sodium chloride    Intravenous Once   sodium chloride    Intravenous Once   acetaminophen   650 mg Per Tube Q6H   Chlorhexidine  Gluconate Cloth  6 each Topical Daily   clonazePAM   0.5 mg Per Tube BID   clopidogrel   75 mg Oral Daily   feeding supplement (PROSource TF20)  60 mL Per Tube QID   free water   300 mL Per Tube Q4H   furosemide   80 mg Intravenous BID   Gerhardt's butt cream   Topical Daily   insulin  aspart  0-20 Units Subcutaneous Q4H   insulin  aspart  5 Units Subcutaneous Q4H   insulin  glargine-yfgn  20 Units Subcutaneous BID   midazolam   4 mg Intravenous Once   midodrine  10 mg Per Tube TID WC   multivitamin  1 tablet Oral QHS   mouth rinse  15 mL Mouth Rinse Q2H   oxyCODONE   5 mg Per Tube Q8H   pantoprazole  (PROTONIX ) IV  40 mg Intravenous QHS   QUEtiapine   50 mg Per Tube QHS   rosuvastatin   40 mg Oral Daily   sodium chloride  flush  10-40 mL Intracatheter Q12H   Infusions:  amiodarone  30 mg/hr (02/23/24 0400)   dexmedetomidine  (PRECEDEX ) IV infusion     feeding supplement (VITAL 1.5 CAL) 45 mL/hr at 02/23/24 0400   heparin  2,000 Units/hr (02/23/24 0400)   meropenem  (MERREM ) IV 1 g (02/23/24 1610)   norepinephrine  (LEVOPHED ) Adult infusion Stopped (02/22/24 1755)   prismasol  BGK 4/2.5     prismasol  BGK 4/2.5     prismasol  BGK 4/2.5     vancomycin  Stopped (02/22/24 1805)   vasopressin  0.03 Units/min (02/23/24 0420)   PRN Medications: albuterol , heparin , heparin , HYDROmorphone (DILAUDID) injection, ondansetron  (ZOFRAN ) IV, mouth rinse, polyvinyl alcohol , sodium chloride  flush   Assessment/Plan   Septic/vasoplegic shock - Now vanc/meropenum ; remains on micafungin .  - Discussed with CCM - MRI - acute infarctions. See below  -On vancomycin  + meropenum.   Acute type A aortic dissection  - Echo 4/15: AAA measuring 5-5.7 cm - Emergent  TEE 4/15: significant for torrential aortic regurgitation and dissection flap extending from the aortic root to the proximal ascending aorta  - CT C/A/P: Showed acute type A dissection with a 5.5 cm  ascending aortic aneurysm extending into arch. Dissection goes down to infrarenal aorta and extends up into innominate and left common carotid arteries  - Now s/p aortic dissection repair 02/01/24 (Bentall/AVR and reimplantation of coronaries.  2. CAD, Acute inferior STEMI - Admitted with STEMI. Post PCI found to also have aortic dissection - LHC with single vessel CAD involving the mid to distal RCA, successful PCI of the RCA with overlapping DES x 3. - No aspirin  with heparin  gtt - Continue plavix  75mg  daily - Continue Crestor  40 mg daily - no changes  3. Acute systolic HF ->  Cardiogenic shock - Post perfusion CGS.  - Echo 4/19: EF 25% RV moderate to severely decreased - Echo 5/5: EF 40% RV normal RA moderately dilated.IVC dilated.  -Remains vaso 0.03. CO-OX 77% -Starting CRRT today may been to restart norepi. Stop IV lasix .   4. Post-op hypoxic resp failure - extubated 4/18.  - re-intubated 4/19 - s/p tracheostomy. Fio2 40%.  - diffuse mucopurulent secretions on bronch 4/19. BAL GS: Mod WBC. Few gram variable rods. Rare yeast - CT C/A/P 4/27 w/ small postoperative seroma around aortic root/graft and possible PNA.  - Resp Cx 4/28 + Candida. Completed  micafungin  5/4 - On meropenum + vanc.    5. PAF with post-termination pauses PVCs - Patient with AF intra-op. Back in SR today. .  - Previously on IV amio. Continue amio 400 mg twice a day  - continue heparin  gtt   6. Hypokalemia/hypernatremia -Sodium stable - Supp K.    7. AKI due to ATN/shock - Baseline SCr ~1.3-1.4 - CRRT Stopped 5/3. BUN up to 126 -Starting CRRT today. Per Nephrology   8. ABLA post-op - Received 6uPRBCs, 6 plt, 4 FFP and 5 cryo intra-op.  - Hgb 7.5 . Getting unit of blood today.   9. Acute Venous  Thrombosis  - +LLE DVT (gastroc vein) - + LUE superficial cephalic venin thrombosis -Continue  heparin  gtt  10. Acute CVA Encephalopathy ,  -- MRI- with acute infarct left parietal junction, subacute infarctions left occipital lobes. -CCM consulting Neuro.    CRITICAL CARE Performed by: Nieves Bars NP-C   Total critical care time: 10 minutes  Critical care time was exclusive of separately billable procedures and treating other patients.  Critical care was necessary to treat or prevent imminent or life-threatening deterioration.  Critical care was time spent personally by me on the following activities: development of treatment plan with patient and/or surrogate as well as nursing, discussions with consultants, evaluation of patient's response to treatment, examination of patient, obtaining history from patient or surrogate, ordering and performing treatments and interventions, ordering and review of laboratory studies, ordering and review of radiographic studies, pulse oximetry and re-evaluation of patient's condition.   Length of Stay: 22  Amy Clegg, NP  02/23/2024, 9:22 AM  Advanced Heart Failure Team Pager [phone] (M-F; 7a - 5p)  Please contact [facility] for night-coverage after hours (5p -7a ) and weekends on [email or web address]  Patient seen with NP, I formulated the plan and agree with the above note.    Last echo showed EF 40% with mild LVH, RV normal function, bioprosthetic aortic valve stable, IVC dilated.     I/Os net negative 1494 with IV Lasix . CVP 18-19.  BUN higher at 126 and more drowsy today.  Co-ox 77%.  He is in NSR today.  Off NE and on vasopressin  0.03.   MRI head with multiple small infarcts, ?global hypoperfusion versus emboli.  General: Drowsy Neck: JVP 16+, no thyromegaly or thyroid nodule.  Lungs: Clear to auscultation bilaterally with normal respiratory effort. CV: Nondisplaced PMI.  Heart regular S1/S2, no S3/S4, no murmur.  1+ ankle edema.    Abdomen: Soft, nontender, no hepatosplenomegaly, no distention.  Skin: Intact without lesions or rashes.  Neurologic: Confused   Extremities: No clubbing or cyanosis.  HEENT: Normal.    Reasonable UOP and stable creatinine but BUN rising and up to 126. CVP 18.  With worsening confusion/drowsiness, will resume CVVH today.     Good co-ox at 77%,  now off NE and on vasopression 0.03. Echo with EF 40%, normal RV function, stable bioprosthetic aortic valve, dilated IVC.  - Wean off vasopressin  as tolerated (though probably will not be able to with initiation of CVVH).  - Continue midodrine.    Trach in place, on vancomycin /meropenem  for empiric PNA coverage.    NSR today, continue amiodarone  and heparin  gtts.    Continue Plavix  and Crestor  with recent PCI.    MRI head with multiple small infarcts, confused and more encephalopathic.  Some of this is likely uremia.  To resume CVVH today.   CRITICAL CARE Performed by: Peder Bourdon  Total critical care time: 40 minutes  Critical care time was exclusive of separately billable procedures and treating other patients.  Critical care was necessary to treat or prevent imminent or life-threatening deterioration.  Critical care was time spent personally by me on the following activities: development of treatment plan with patient and/or surrogate as well as nursing, discussions with consultants, evaluation of patient's response to treatment, examination of patient, obtaining history from patient or surrogate, ordering and performing treatments and interventions, ordering and review of laboratory studies, ordering and review of radiographic studies, pulse oximetry and re-evaluation of patient's condition.  Peder Bourdon 02/23/2024 11:11 AM

## 2024-02-23 NOTE — Progress Notes (Addendum)
 I spoke with Dr. Lydia Sams with [facility] and Burdette Carolin with pharmacy. We will initiate CRRT without heparin  through the circuit. If patient starts clotting off CRRT machine then the heparin  order in the [facility] can be used.

## 2024-02-23 NOTE — Progress Notes (Signed)
 Patient ID: Dustin Davenport, male   DOB: [birth year], 68 y.o.   MRN: [id number] [facility] Progress Note   Assessment/ Plan:   1. Acute kidney Injury: Secondary to ischemic ATN in the setting of STEMI/type A aortic dissection.  Azotemia continues to worsen with essentially fixed creatinine and decent urine output.  Unfortunately this may be contributing to his decreased responsiveness and I will order for CRRT (based on current fixed pressor dependency/low blood pressures) for clearance. 2.  Coronary artery disease: Status post inferior wall STEMI status post PCI to RCA complicated by acute systolic heart failure.  Hemodynamically stable at this point with decent response to furosemide . 3.  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 4.  Anemia: Secondary to aortic dissection/surgical losses and acute/critical illness.  Continue to monitor for PRBC indications. 5.  Hypernatremia: Water  deficit estimated around 2 L, secondary to auto diuresis and impaired free water  intake-begin enteral free water .  Subjective:   With hemoglobin drop noted this morning prompting order for PRBC transfusion.   Objective:   BP (!) 90/50   Pulse 62   Temp 99.6 F (37.6 C) (Oral)   Resp 20   Ht 6\' 1"  (1.854 m)   Wt 114.8 kg   SpO2 98%   BMI 33.39 kg/m   Intake/Output Summary (Last 24 hours) at 02/23/2024 0743 Last data filed at 02/23/2024 0301 Gross per 24 hour  Intake 1676.34 ml  Output 3170 ml  Net -1493.66 ml   Weight change: -6 kg  Physical Exam: Gen: Resting comfortably, unarousable CVS: Pulse regular rhythm, normal rate, S1 and S2 distant Resp: On ventilator via tracheostomy, mechanical breath sounds audible Abd: Soft, obese, nontender, bowel sounds normal Ext: 2+ upper extremity edema with 1+ lower extremity edema  Imaging: MR BRAIN WO CONTRAST Result Date: 02/22/2024 CLINICAL DATA:  Mental status change of  unknown cause EXAM: MRI HEAD WITHOUT CONTRAST TECHNIQUE: Multiplanar, multiecho pulse sequences of the brain and surrounding structures were obtained without intravenous contrast. COMPARISON:  02/17/2024 FINDINGS: Brain: Diffusion imaging shows a subcentimeter focus of acute infarction in the deep white matter at the left frontal parietal junction. There are subacute infarctions present within the deep white matter of the left occipital lobe and temporal lobe. Few punctate foci also present right posterior temporal lobe. No large confluent infarction. No mass, hemorrhage, hydrocephalus or extra-axial collection. Findings could be due to embolic disease or global hypoperfusion. Vascular: Major vessels at the base of the brain show flow. Skull and upper cervical spine: Negative Sinuses/Orbits: Mucosal inflammatory changes of the maxillary and sphenoid sinuses. Orbits negative. Other: Extensive bilateral mastoid effusions, often seen with ventilator support. IMPRESSION: Subcentimeter focus of acute infarction in the deep white matter at the left frontal parietal junction. Subacute infarctions in the deep white matter of the left occipital lobe and temporal lobe. Few punctate foci also present in the right posterior temporal lobe. Findings could be due to embolic disease or global hypoperfusion. Electronically Signed   By: Bettylou Brunner M.D.   On: 02/22/2024 14:36   ECHOCARDIOGRAM COMPLETE Result Date: 02/21/2024    ECHOCARDIOGRAM REPORT   Patient Name:   Dustin Davenport Moret Date of Exam: 02/21/2024 Medical Rec #:  [id number]    Height:       73.0 in Accession #:    [id number]   Weight:       260.6 lb Date of Birth:  Feb 17, 1956    BSA:  2.408 m Patient Age:    67 years     BP:           117/54 mmHg Patient Gender: M            HR:           97 bpm. Exam Location:  Inpatient Procedure: 2D Echo, Cardiac Doppler, Color Doppler and Intracardiac            Opacification Agent (Both Spectral and Color Flow Doppler were             utilized during procedure). Indications:    CHF I50.21  History:        Patient has prior history of Echocardiogram examinations, most                 recent 02/05/2024. Acute MI. Aortic dissection repair.                 Aortic Valve: 27 mm bioprosthetic valve is present in the aortic                 position. Procedure Date: 02/01/24.  Sonographer:    Terrilee Few RCS Referring Phys: [phone] DALTON S MCLEAN IMPRESSIONS  1. Left ventricular ejection fraction, by estimation, is 40%. The left ventricle has moderately decreased function. The left ventricle demonstrates global hypokinesis. There is mild concentric left ventricular hypertrophy. Left ventricular diastolic parameters are indeterminate.  2. Right ventricular systolic function is normal. The right ventricular size is mildly enlarged. There is mildly elevated pulmonary artery systolic pressure.  3. Left atrial size was mildly dilated.  4. Right atrial size was moderately dilated.  5. A small pericardial effusion is present. The pericardial effusion is circumferential.  6. The mitral valve is normal in structure. Trivial mitral valve regurgitation. No evidence of mitral stenosis.  7. The aortic valve has been repaired/replaced. Aortic valve regurgitation is not visualized. No aortic stenosis is present. There is a 27 mm bioprosthetic valve present in the aortic position. Procedure Date: 02/01/24.  8. Aortic root/ascending aorta has been repaired/replaced.  9. The inferior vena cava is dilated in size with <50% respiratory variability, suggesting right atrial pressure of 15 mmHg. Comparison(s): Prior images reviewed side by side. LV function slightly improved compared to prior. FINDINGS  Left Ventricle: Left ventricular ejection fraction, by estimation, is 40%. The left ventricle has moderately decreased function. The left ventricle demonstrates global hypokinesis. Definity  contrast agent was given IV to delineate the left ventricular endocardial borders. The  left ventricular internal cavity size was normal in size. There is mild concentric left ventricular hypertrophy. Left ventricular diastolic parameters are indeterminate. Right Ventricle: The right ventricular size is mildly enlarged. Right vetricular wall thickness was not well visualized. Right ventricular systolic function is normal. There is mildly elevated pulmonary artery systolic pressure. The tricuspid regurgitant  velocity is 2.60 m/s, and with an assumed right atrial pressure of 15 mmHg, the estimated right ventricular systolic pressure is 42.0 mmHg. Left Atrium: Left atrial size was mildly dilated. Right Atrium: Right atrial size was moderately dilated. Pericardium: A small pericardial effusion is present. The pericardial effusion is circumferential. Mitral Valve: The mitral valve is normal in structure. Trivial mitral valve regurgitation. No evidence of mitral valve stenosis. Tricuspid Valve: The tricuspid valve is normal in structure. Tricuspid valve regurgitation is trivial. No evidence of tricuspid stenosis. Aortic Valve: The aortic valve has been repaired/replaced. Aortic valve regurgitation is not visualized. No aortic stenosis is present. Aortic valve peak  gradient measures 11.7 mmHg. There is a 27 mm bioprosthetic valve present in the aortic position. Procedure Date: 02/01/24. Pulmonic Valve: The pulmonic valve was not well visualized. Pulmonic valve regurgitation is trivial. No evidence of pulmonic stenosis. Aorta: The aortic root/ascending aorta has been repaired/replaced. Venous: The inferior vena cava is dilated in size with less than 50% respiratory variability, suggesting right atrial pressure of 15 mmHg. IAS/Shunts: The atrial septum is grossly normal.  LEFT VENTRICLE PLAX 2D LVIDd:         3.90 cm   Diastology LVIDs:         3.30 cm   LV e' medial:    12.50 cm/s LV PW:         1.30 cm   LV E/e' medial:  7.9 LV IVS:        1.20 cm   LV e' lateral:   21.00 cm/s LVOT diam:     2.40 cm   LV E/e'  lateral: 4.7 LV SV:         56 LV SV Index:   23 LVOT Area:     4.52 cm  RIGHT VENTRICLE             IVC RV S prime:     15.30 cm/s  IVC diam: 2.60 cm TAPSE (M-mode): 1.8 cm LEFT ATRIUM             Index        RIGHT ATRIUM           Index LA diam:        3.90 cm 1.62 cm/m   RA Area:     30.40 cm LA Vol (A2C):   70.5 ml 29.28 ml/m  RA Volume:   115.00 ml 47.76 ml/m LA Vol (A4C):   77.0 ml 31.98 ml/m LA Biplane Vol: 73.7 ml 30.61 ml/m  AORTIC VALVE AV Area (Vmax): 2.24 cm AV Vmax:        171.00 cm/s AV Peak Grad:   11.7 mmHg LVOT Vmax:      84.55 cm/s LVOT Vmean:     50.350 cm/s LVOT VTI:       0.124 m  AORTA Ao Root diam: 3.70 cm Ao Asc diam:  3.10 cm MITRAL VALVE               TRICUSPID VALVE MV Area (PHT): 4.21 cm    TR Peak grad:   27.0 mmHg MV Decel Time: 180 msec    TR Vmax:        260.00 cm/s MV E velocity: 99.10 cm/s MV A velocity: 49.80 cm/s  SHUNTS MV E/A ratio:  1.99        Systemic VTI:  0.12 m                            Systemic Diam: 2.40 cm Sheryle Donning MD Electronically signed by Sheryle Donning MD Signature Date/Time: 02/21/2024/9:15:26 PM    Final    VAS US  LOWER EXTREMITY VENOUS (DVT) Result Date: 02/21/2024  Lower Venous DVT Study Patient Name:  Dustin Davenport  Date of Exam:   02/21/2024 Medical Rec #: [id number]     Accession #:    [id number] Date of Birth: 02-Sep-1956     Patient Gender: M Patient Age:   68 years Exam Location:  [facility] Procedure:      VAS US  LOWER EXTREMITY VENOUS (DVT) Referring Phys: Felton Hough --------------------------------------------------------------------------------  Indications: Edema.  Risk Factors: DVT Hx. Comparison Study: Thrombus reductions seen since previous exam 02/14/24. Performing Technologist: Estanislao Heimlich  Examination Guidelines: A complete evaluation includes B-mode imaging, spectral Doppler, color Doppler, and power Doppler as needed of all accessible portions of each vessel. Bilateral testing is considered an integral  part of a complete examination. Limited examinations for reoccurring indications may be performed as noted. The reflux portion of the exam is performed with the patient in reverse Trendelenburg.  +---------+---------------+---------+-----------+----------+--------------+ RIGHT    CompressibilityPhasicitySpontaneityPropertiesThrombus Aging +---------+---------------+---------+-----------+----------+--------------+ CFV      Full           Yes      Yes                                 +---------+---------------+---------+-----------+----------+--------------+ SFJ      Full                                                        +---------+---------------+---------+-----------+----------+--------------+ FV Prox  Full                                                        +---------+---------------+---------+-----------+----------+--------------+ FV Mid   Full                                                        +---------+---------------+---------+-----------+----------+--------------+ FV DistalFull                                                        +---------+---------------+---------+-----------+----------+--------------+ PFV      Full                                                        +---------+---------------+---------+-----------+----------+--------------+ POP      Full           Yes      Yes                                 +---------+---------------+---------+-----------+----------+--------------+ PTV      Full                    Yes                                 +---------+---------------+---------+-----------+----------+--------------+ PERO     Full                    Yes                                 +---------+---------------+---------+-----------+----------+--------------+   +---------+---------------+---------+-----------+----------+-----------------+  LEFT     CompressibilityPhasicitySpontaneityPropertiesThrombus Aging     +---------+---------------+---------+-----------+----------+-----------------+ CFV      Full           Yes      Yes                                    +---------+---------------+---------+-----------+----------+-----------------+ SFJ      Full                                                           +---------+---------------+---------+-----------+----------+-----------------+ FV Prox  Full                                                           +---------+---------------+---------+-----------+----------+-----------------+ FV Mid   Full                                                           +---------+---------------+---------+-----------+----------+-----------------+ FV Distal                        Yes                                    +---------+---------------+---------+-----------+----------+-----------------+ PFV      Full                                                           +---------+---------------+---------+-----------+----------+-----------------+ POP      Full           Yes      Yes                                    +---------+---------------+---------+-----------+----------+-----------------+ PTV      Full                    Yes                                    +---------+---------------+---------+-----------+----------+-----------------+ PERO     Full                    Yes                                    +---------+---------------+---------+-----------+----------+-----------------+ Gastroc  Partial        No       Yes  Age Indeterminate +---------+---------------+---------+-----------+----------+-----------------+     Summary: RIGHT: - There is no evidence of deep vein thrombosis in the lower extremity.  - No cystic structure found in the popliteal fossa.  LEFT: Findings consistent with age indeterminate intramuscular thrombus involving the left gastrocnemius veins. - No cystic structure found in  the popliteal fossa.  *See table(s) above for measurements and observations. Electronically signed by Angela Kell MD on 02/21/2024 at 4:03:55 PM.    Final     Labs: BMET Recent Labs  Lab 02/19/24 1610 02/19/24 1704 02/20/24 9604 02/20/24 1545 02/21/24 0441 02/21/24 1543 02/22/24 0327 02/22/24 0801 02/23/24 0358  NA 133* 137 136 140 139 143 144 146* 147*  K 3.7 3.7 3.4* 3.6 3.6 3.5 3.1* 3.4* 3.6  CL 101 103 105 107 108 104 107 111 110  CO2 23 23 23 22 22  20* 21* 23 27  GLUCOSE 314* 258* 257* 161* 179* 130* 151* 130* 98  BUN 51* 54* 69* 83* 97* 90* 108* 101* 126*  CREATININE 1.16 1.26* 1.33* 1.44* 1.64* 1.54* 1.58* 1.55* 1.60*  CALCIUM  7.5* 7.7* 7.7* 8.2* 8.2* 8.0* 9.1 8.3* 9.2  PHOS 3.3 3.6 3.8 4.3 6.0*  --  5.3*  --  6.5*   CBC Recent Labs  Lab 02/20/24 0414 02/20/24 1545 02/21/24 0441 02/22/24 0801 02/23/24 0358  WBC 29.7*  --  27.1* 19.2* 17.0*  HGB 6.9* 8.0* 8.0* 7.2* 7.5*  HCT 23.4* 26.8* 26.4* 24.6* 26.1*  MCV 92.5  --  91.3 90.8 92.6  PLT 615*  --  612* 578* 569*    Medications:     sodium chloride    Intravenous Once   sodium chloride    Intravenous Once   acetaminophen   650 mg Per Tube Q6H   Chlorhexidine  Gluconate Cloth  6 each Topical Daily   clonazePAM   0.5 mg Per Tube BID   clopidogrel   75 mg Oral Daily   feeding supplement (PROSource TF20)  60 mL Per Tube QID   furosemide   80 mg Intravenous BID   Gerhardt's butt cream   Topical Daily   insulin  aspart  0-20 Units Subcutaneous Q4H   insulin  aspart  5 Units Subcutaneous Q4H   insulin  glargine-yfgn  20 Units Subcutaneous BID   midazolam   4 mg Intravenous Once   midodrine  10 mg Per Tube TID WC   multivitamin  1 tablet Oral QHS   mouth rinse  15 mL Mouth Rinse Q2H   oxyCODONE   10 mg Per Tube Q8H   pantoprazole  (PROTONIX ) IV  40 mg Intravenous QHS   QUEtiapine   50 mg Per Tube BID   rosuvastatin   40 mg Oral Daily   scopolamine  1 patch Transdermal Q72H   sodium chloride  flush  10-40 mL Intracatheter  Q12H    Clevester Dally, MD 02/23/2024, 7:43 AM

## 2024-02-23 NOTE — Progress Notes (Signed)
 PHARMACY - ANTICOAGULATION CONSULT NOTE  Pharmacy Consult for UFH IV Infusion Indication: atrial fibrillation / LLE DVT  Allergies  Allergen Reactions   Bee Venom     Patient Measurements: Height: 6\' 1"  (185.4 cm) Weight: 114.8 kg (253 lb 1.4 oz) IBW/kg (Calculated) : 79.9 HEPARIN  DW (KG): 106  Vital Signs: Temp: 99.6 F (37.6 C) (05/07 0000) Temp Source: Oral (05/07 0000) BP: 90/50 (05/07 0415) Pulse Rate: 62 (05/07 0415)  Labs: Recent Labs    02/21/24 0441 02/21/24 1543 02/22/24 0327 02/22/24 0801 02/23/24 0358  HGB 8.0*  --   --  7.2* 7.5*  HCT 26.4*  --   --  24.6* 26.1*  PLT 612*  --   --  578* 569*  HEPARINUNFRC 0.24*  --  0.32  --  0.55  CREATININE 1.64*   < > 1.58* 1.55* 1.60*   < > = values in this interval not displayed.    Estimated Creatinine Clearance: 58.7 mL/min (A) (by C-G formula based on SCr of 1.6 mg/dL (H)).   Medical History: Past Medical History:  Diagnosis Date   Hypertension     Medications:   Infusions:   amiodarone  30 mg/hr (02/23/24 0400)   dexmedetomidine  (PRECEDEX ) IV infusion     feeding supplement (VITAL 1.5 CAL) 45 mL/hr at 02/23/24 0400   heparin  2,000 Units/hr (02/23/24 0400)   meropenem  (MERREM ) IV 1 g (02/23/24 0621)   norepinephrine  (LEVOPHED ) Adult infusion Stopped (02/22/24 1755)   vancomycin  Stopped (02/22/24 1805)   vasopressin  0.03 Units/min (02/23/24 0420)   PRN: albuterol , HYDROmorphone (DILAUDID) injection, ondansetron  (ZOFRAN ) IV, mouth rinse, polyvinyl alcohol , sodium chloride  flush    Assessment: Dustin Davenport is 68 y.o. male with PMH HTN and HLD. Admitted with STEMI now with CGS. Found to have AAA requiring emergent surgical repair. Now s/p [place]/AVR.  Pharmacy asked to start IV heparin  4/27 for afib.  LLE DVT and superficial thrombus in LUE found on doppler 4/28.  Heparin  level is slightly supratherapeutic at 0.55 (but close to goal), on heparin  infusion at 2000 units/hr. Hgb 7.5, plt 569. No s/sx of  bleeding or infusion issues. Plan to restart CRRT today.   Goal of Therapy:  Heparin  level 0.3-0.5 units/ml Monitor platelets by anticoagulation protocol: Yes   Plan:  Continue heparin  infusion at 2000 units/hr Daily heparin  level and CBC Monitor s/s bleeding.  Thank you for allowing pharmacy to participate in this patient's care,  Nieves Bars, PharmD, BCCCP Clinical Pharmacist  Phone: [phone] 02/23/2024 7:29 AM  Please check AMION for all [facility] Pharmacy phone numbers After 10:00 PM, call Main Pharmacy [phone]

## 2024-02-23 NOTE — Procedures (Signed)
 Central Venous Catheter Insertion Procedure Note  Dustin Davenport  [id number]  18-Sep-1956  Date:02/23/24  Time:10:29 AM   Provider Performing:Damek Ende D Marlys Singh   Procedure: Insertion of Non-tunneled Central Venous Catheter(36556)with US  guidance (84696)    Indication(s) Hemodialysis  Consent Risks of the procedure as well as the alternatives and risks of each were explained to the patient and/or caregiver.  Consent for the procedure was obtained and is signed in the bedside chart  Anesthesia Topical only with 1% lidocaine    Timeout Verified patient identification, verified procedure, site/side was marked, verified correct patient position, special equipment/implants available, medications/allergies/relevant history reviewed, required imaging and test results available.  Sterile Technique Maximal sterile technique including full sterile barrier drape, hand hygiene, sterile gown, sterile gloves, mask, hair covering, sterile ultrasound probe cover (if used).  Procedure Description Area of catheter insertion was cleaned with chlorhexidine  and draped in sterile fashion.   With real-time ultrasound guidance a HD catheter was placed into the left internal jugular vein.  Nonpulsatile blood flow and easy flushing noted in all ports.  The catheter was sutured in place and sterile dressing applied.  Complications/Tolerance None; patient tolerated the procedure well. Chest X-ray is ordered to verify placement for internal jugular or subclavian cannulation.  Chest x-ray is not ordered for femoral cannulation.  EBL Minimal  Specimen(s) None  Dustin Davenport [facility] 02/23/2024, 10:30 AM  Please see [email or web address] for pager details.  From 7A-7P if no response, please call [phone]. After hours, please call ELink [phone].

## 2024-02-23 NOTE — Progress Notes (Signed)
 NAMESCIPIO PRESSNALL, MRN:  [id number], DOB:  01-31-1956, LOS: 22 ADMISSION DATE:  02/01/2024, CONSULTATION DATE:  02/03/24 REFERRING MD:  Sherene Dilling, CHIEF COMPLAINT:  chest pain   History of Present Illness:  68 year old man w/ hx of HTN, obesity who presented on 4/15 with sudden onset chest pain.  EKG showing inferior STEMI and went to cath lab for RCA DES x 3.  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  Taken emergently to OR for Bentall+ ascending/arch replacement.  Procedure complicated by coagulopathy related to antiplatelet therapy from recent stent, multiple liters of blood products given.  Returned to ICU 4/16 on vent and PCCM consulted to assist with vent wean on 4/17.  Currently heavily sedated; attempts at weaning resulted in agitation without ability to redirect.  Pertinent  Medical History   Past Medical History:  Diagnosis Date   Hypertension     Significant Hospital Events: Including procedures, antibiotic start and stop dates in addition to other pertinent events   4/16 RCA stent then Bentall, aortic arch replacement 4/18 extubated 4/19 reintubated in afternoon for aspiration, bronch, Delirious, low grade fevers, expressive aphasia 4/20 but mental status precludes extubation 4/21 started spiking fever again, meropenem  switched back from Zosyn , on vancomycin  as well 02/07/22 failed spontaneous breathing trial 4/24 Tachypnea, fever. Resp culture sent  4/25 underwent right heart cath showing normal filling pressure 4/26 remained in A-fib, became afebrile, Precedex  was switched to low-dose propofol  5/2 trach 5/3 ongoing fevers, leukocytosis; vanc/meropenem  started; CRRT held 5/4 HD catheter removed for ongoing leukocytosis  Interim History / Subjective:  No events Rested on vent  Objective   Blood pressure (!) 90/50, pulse (!) 56, temperature 99.6 F (37.6 C), temperature source  Oral, resp. rate 20, height 6\' 1"  (1.854 m), weight 114.8 kg, SpO2 100%. CVP:  [7 mmHg-32 mmHg] 13 mmHg  Vent Mode: PRVC FiO2 (%):  [50 %] 50 % Set Rate:  [20 bmp] 20 bmp Vt Set:  [570 mL] 570 mL PEEP:  [8 cmH20] 8 cmH20 Plateau Pressure:  [20 cmH20-22 cmH20] 20 cmH20   Intake/Output Summary (Last 24 hours) at 02/23/2024 0865 Last data filed at 02/23/2024 0301 Gross per 24 hour  Intake 1626.34 ml  Output 2885 ml  Net -1258.66 ml   Filed Weights   02/21/24 0500 02/22/24 0500 02/23/24 0415  Weight: 118.2 kg 120.8 kg 114.8 kg    Examination: No distress More somnolent this am Unna in place Ongoing anasarca Lung mechanics okay  Sodium, BUN up WBC, plts slightly improved again Hgb stable Temps borderline  Resolved Hospital Problem list   Constipation Shock combined cardiogenic/septic Hypokalemia/hypophosphatemia Shock liver, improving Perioperative coagulopathy  Ileus, resolved- off reglan  Constipation, resolved  Assessment & Plan:  Acute inferior wall STEMI status post PCI and DES 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 secretions Acute kidney injury due to ischemic ATN from shock, stable CRRT held 5/3; no immediate need to restart at this time; primarily done for uremia- see discussion below Mixed anoxic, uremic encephalopathy plus ICU delirium- prognosticating is challenging at this point but he has plateau'd with mental status improvement Perioperative acute blood loss anemia- stable; now just related to bone  marrow stunning and phlebotomy Obesity Leukocytosis- improving slowly Uncontrolled hyperglycemia; A1c 5.3 Hypokalemia Stage 1 sacral ulcer LLE  DVT- on heparin  gtt  Overall mental status has plateau'd despite BUN normalization.  MRI is in grey zone.  CRRT stopped to give HD line holiday.  Discussed with neuro: will place HD line, re-normalize BUN so can have clinical eval without uremia confounding.  Then can discuss whether to pursue LTACH for a time or transition to comfort.  - TC once on CRRT - turning, pain/anxiety/delirium meds as ordered: lowering oxy a bit; DC scopalamine patch, seroquel  to qHS - unna unilaterally (DVT on other side) - continue AC pending surgical plans - GOC once BUN normalizes and neuro eval; wife is realistic, patient would not want long term life support - 7 days abx for sepsis NOS, end date in place  Best Practice (right click and "Reselect all SmartList Selections" daily)   Diet/type: Tube feeds DVT prophylaxis heparin  gtt Pressure ulcer(s): yes, see nursing documentation GI prophylaxis: PPI Lines: Central line picc Foley:  Yes, and it is still needed Code Status:  full code Last date of multidisciplinary goals of care discussion: 5/5 updated at bedside  32 min cc time  Josiah Nigh, MD 02/23/24 8:08 AM [facility]  For contact information, see Amion. If no response to pager, please call PCCM consult pager. After hours, 7PM- 7AM, please call Elink.

## 2024-02-23 NOTE — Consult Note (Signed)
 WOC Nurse wound follow up Wound type:Stage 3 pressure injury to apex of gluteal cleft.  Surrounding intact maroon discoloration consistent with deep tissue injury.  New photo in chart.  Measurement: 3.4 cm x 0.3 cm x 0.1 cm Wound bed: pink and moist  Drainage (amount, consistency, odor) minimal serosanguinous  no odor  Periwound: intact maroon discoloration  will monitor for evolution of wound.  Dressing procedure/placement/frequency:Implement VASHE moist dressings daily.  Cover with ABD pad instead of silicone foam due to excess moisture.  Will follow.  Branda Cain MSN, RN, FNP-BC CWON Wound, Ostomy, Continence Nurse Outpatient [facility] [phone] Pager [phone]

## 2024-02-23 NOTE — Progress Notes (Signed)
 eLink Physician-Brief Progress Note [name] DOB: June 09, 1956 MRN: [id number]   Date of Service  02/23/2024  HPI/Events of Note  Hemoglobin 7.5 gm / dl this morning.  eICU Interventions  Given recent STEMI history will transfuse one unit of PRBC to bring the hemoglobin above 8 gm / dl.        Maranda Marte U Carey Lafon 02/23/2024, 5:29 AM

## 2024-02-23 NOTE — Progress Notes (Signed)
 SLP Cancellation Note  Patient Details Name: Dustin Davenport MRN: [id number] DOB: Feb 23, 1956   Cancelled treatment:       Reason Eval/Treat Not Completed: Patient not medically ready. Checked in with RN. Will f/u.    Antonya Leeder, Hardin Leys 02/23/2024, 9:46 AM

## 2024-02-23 NOTE — Plan of Care (Signed)
 Problem: Education: Goal: Knowledge of General Education information will improve Description: Including pain rating scale, medication(s)/side effects and non-pharmacologic comfort measures Outcome: Progressing   Problem: Health Behavior/Discharge Planning: Goal: Ability to manage health-related needs will improve Outcome: Progressing   Problem: Clinical Measurements: Goal: Ability to maintain clinical measurements within normal limits will improve Outcome: Progressing Goal: Will remain free from infection Outcome: Progressing Goal: Diagnostic test results will improve Outcome: Progressing Goal: Respiratory complications will improve Outcome: Progressing Goal: Cardiovascular complication will be avoided Outcome: Progressing   Problem: Activity: Goal: Risk for activity intolerance will decrease Outcome: Progressing   Problem: Nutrition: Goal: Adequate nutrition will be maintained Outcome: Progressing   Problem: Coping: Goal: Level of anxiety will decrease Outcome: Progressing   Problem: Elimination: Goal: Will not experience complications related to bowel motility Outcome: Progressing Goal: Will not experience complications related to urinary retention Outcome: Progressing   Problem: Pain Managment: Goal: General experience of comfort will improve and/or be controlled Outcome: Progressing   Problem: Safety: Goal: Ability to remain free from injury will improve Outcome: Progressing   Problem: Skin Integrity: Goal: Risk for impaired skin integrity will decrease Outcome: Progressing   Problem: Education: Goal: Understanding of cardiac disease, CV risk reduction, and recovery process will improve Outcome: Progressing Goal: Individualized Educational Video(s) Outcome: Progressing   Problem: Activity: Goal: Ability to tolerate increased activity will improve Outcome: Progressing   Problem: Cardiac: Goal: Ability to achieve and maintain adequate cardiovascular  perfusion will improve Outcome: Progressing   Problem: Health Behavior/Discharge Planning: Goal: Ability to safely manage health-related needs after discharge will improve Outcome: Progressing   Problem: Education: Goal: Understanding of CV disease, CV risk reduction, and recovery process will improve Outcome: Progressing Goal: Individualized Educational Video(s) Outcome: Progressing   Problem: Activity: Goal: Ability to return to baseline activity level will improve Outcome: Progressing   Problem: Cardiovascular: Goal: Ability to achieve and maintain adequate cardiovascular perfusion will improve Outcome: Progressing Goal: Vascular access site(s) Level 0-1 will be maintained Outcome: Progressing   Problem: Health Behavior/Discharge Planning: Goal: Ability to safely manage health-related needs after discharge will improve Outcome: Progressing   Problem: Education: Goal: Will demonstrate proper wound care and an understanding of methods to prevent future damage Outcome: Progressing Goal: Knowledge of disease or condition will improve Outcome: Progressing Goal: Knowledge of the prescribed therapeutic regimen will improve Outcome: Progressing Goal: Individualized Educational Video(s) Outcome: Progressing   Problem: Activity: Goal: Risk for activity intolerance will decrease Outcome: Progressing   Problem: Cardiac: Goal: Will achieve and/or maintain hemodynamic stability Outcome: Progressing   Problem: Clinical Measurements: Goal: Postoperative complications will be avoided or minimized Outcome: Progressing   Problem: Respiratory: Goal: Respiratory status will improve Outcome: Progressing   Problem: Skin Integrity: Goal: Wound healing without signs and symptoms of infection Outcome: Progressing Goal: Risk for impaired skin integrity will decrease Outcome: Progressing   Problem: Urinary Elimination: Goal: Ability to achieve and maintain adequate renal perfusion  and functioning will improve Outcome: Progressing   Problem: Safety: Goal: Non-violent Restraint(s) Outcome: Progressing   Problem: Education: Goal: Ability to describe self-care measures that may prevent or decrease complications (Diabetes Survival Skills Education) will improve Outcome: Progressing Goal: Individualized Educational Video(s) Outcome: Progressing   Problem: Coping: Goal: Ability to adjust to condition or change in health will improve Outcome: Progressing   Problem: Fluid Volume: Goal: Ability to maintain a balanced intake and output will improve Outcome: Progressing   Problem: Health Behavior/Discharge Planning: Goal: Ability to identify and utilize available resources and services  will improve Outcome: Progressing Goal: Ability to manage health-related needs will improve Outcome: Progressing   Problem: Metabolic: Goal: Ability to maintain appropriate glucose levels will improve Outcome: Progressing

## 2024-02-23 NOTE — Progress Notes (Signed)
 [facility] ADULT ICU REPLACEMENT PROTOCOL   The patient does apply for the [facility] Adult ICU Electrolyte Replacment Protocol based on the criteria listed below:   1.Exclusion criteria: TCTS, ECMO, Dialysis, and Myasthenia Gravis patients 2. Is GFR >/= 30 ml/min? Yes.    Patient's GFR today is 47 3. Is SCr </= 2? Yes.   Patient's SCr is 1.60 mg/dL 4. Did SCr increase >/= 0.5 in 24 hours? No. 5.Pt's weight >40kg  Yes.   6. Abnormal electrolyte(s): K+ = 3.6  7. Electrolytes replaced per protocol 8.  Call MD STAT for K+ </= 2.5, Phos </= 1, or Mag </= 1 Physician:  Iran Manna, eMD   Jeffry Minister 02/23/2024 5:12 AM

## 2024-02-23 NOTE — Plan of Care (Signed)
   Problem: Education: Goal: Knowledge of General Education information will improve Description: Including pain rating scale, medication(s)/side effects and non-pharmacologic comfort measures Outcome: Not Progressing   Problem: Health Behavior/Discharge Planning: Goal: Ability to manage health-related needs will improve Outcome: Not Progressing

## 2024-02-24 ENCOUNTER — Inpatient Hospital Stay (HOSPITAL_COMMUNITY)

## 2024-02-24 DIAGNOSIS — I480 Paroxysmal atrial fibrillation: Secondary | ICD-10-CM | POA: Diagnosis not present

## 2024-02-24 DIAGNOSIS — Z515 Encounter for palliative care: Secondary | ICD-10-CM

## 2024-02-24 DIAGNOSIS — I639 Cerebral infarction, unspecified: Secondary | ICD-10-CM

## 2024-02-24 DIAGNOSIS — Z7189 Other specified counseling: Secondary | ICD-10-CM

## 2024-02-24 DIAGNOSIS — R4182 Altered mental status, unspecified: Secondary | ICD-10-CM | POA: Diagnosis not present

## 2024-02-24 DIAGNOSIS — J189 Pneumonia, unspecified organism: Secondary | ICD-10-CM | POA: Diagnosis not present

## 2024-02-24 DIAGNOSIS — J9601 Acute respiratory failure with hypoxia: Secondary | ICD-10-CM | POA: Diagnosis not present

## 2024-02-24 DIAGNOSIS — I2111 ST elevation (STEMI) myocardial infarction involving right coronary artery: Secondary | ICD-10-CM | POA: Diagnosis not present

## 2024-02-24 DIAGNOSIS — I5020 Unspecified systolic (congestive) heart failure: Secondary | ICD-10-CM | POA: Diagnosis not present

## 2024-02-24 DIAGNOSIS — R569 Unspecified convulsions: Secondary | ICD-10-CM | POA: Diagnosis not present

## 2024-02-24 DIAGNOSIS — G9340 Encephalopathy, unspecified: Secondary | ICD-10-CM | POA: Diagnosis not present

## 2024-02-24 DIAGNOSIS — J9602 Acute respiratory failure with hypercapnia: Secondary | ICD-10-CM | POA: Diagnosis not present

## 2024-02-24 DIAGNOSIS — E876 Hypokalemia: Secondary | ICD-10-CM | POA: Diagnosis not present

## 2024-02-24 LAB — RENAL FUNCTION PANEL
Albumin: 2.4 g/dL — ABNORMAL LOW (ref 3.5–5.0)
Albumin: 2.5 g/dL — ABNORMAL LOW (ref 3.5–5.0)
Anion gap: 10 (ref 5–15)
Anion gap: 9 (ref 5–15)
BUN: 64 mg/dL — ABNORMAL HIGH (ref 8–23)
BUN: 63 mg/dL — ABNORMAL HIGH (ref 8–23)
CO2: 24 mmol/L (ref 22–32)
CO2: 25 mmol/L (ref 22–32)
Calcium: 8.2 mg/dL — ABNORMAL LOW (ref 8.9–10.3)
Calcium: 8.5 mg/dL — ABNORMAL LOW (ref 8.9–10.3)
Chloride: 105 mmol/L (ref 98–111)
Chloride: 104 mmol/L (ref 98–111)
Creatinine, Ser: 1 mg/dL (ref 0.61–1.24)
Creatinine, Ser: 1.09 mg/dL (ref 0.61–1.24)
GFR, Estimated: >60 mL/min (ref >60)
GFR, Estimated: >60 mL/min (ref >60)
Glucose, Bld: 212 mg/dL — ABNORMAL HIGH (ref 70–99)
Glucose, Bld: 114 mg/dL — ABNORMAL HIGH (ref 70–99)
Phosphorus: 3.1 mg/dL (ref 2.5–4.6)
Phosphorus: 3.4 mg/dL (ref 2.5–4.6)
Potassium: 3.8 mmol/L (ref 3.5–5.1)
Potassium: 4.3 mmol/L (ref 3.5–5.1)
Sodium: 139 mmol/L (ref 135–145)
Sodium: 138 mmol/L (ref 135–145)

## 2024-02-24 LAB — CBC
HCT: 27.6 % — ABNORMAL LOW (ref 39.0–52.0)
Hemoglobin: 8.2 g/dL — ABNORMAL LOW (ref 13.0–17.0)
MCH: 27.2 pg (ref 26.0–34.0)
MCHC: 29.7 g/dL — ABNORMAL LOW (ref 30.0–36.0)
MCV: 91.7 fL (ref 80.0–100.0)
Platelets: 507 10*3/uL — ABNORMAL HIGH (ref 150–400)
RBC: 3.01 MIL/uL — ABNORMAL LOW (ref 4.22–5.81)
RDW: 19.7 % — ABNORMAL HIGH (ref 11.5–15.5)
WBC: 12 10*3/uL — ABNORMAL HIGH (ref 4.0–10.5)
nRBC: 14.9 % — ABNORMAL HIGH (ref 0.0–0.2)

## 2024-02-24 LAB — APTT: aPTT: 130 s — ABNORMAL HIGH (ref 24–36)

## 2024-02-24 LAB — TYPE AND SCREEN
ABO/RH(D): B NEG
Antibody Screen: NEGATIVE
Unit division: 0

## 2024-02-24 LAB — BPAM RBC
Blood Product Expiration Date: 202505312359
ISSUE DATE / TIME: 202505071100
Unit Type and Rh: 1700

## 2024-02-24 LAB — COOXEMETRY PANEL
Carboxyhemoglobin: 1.7 % — ABNORMAL HIGH (ref 0.5–1.5)
Methemoglobin: <0.7 % (ref 0.0–1.5)
O2 Saturation: 72.3 %
Total hemoglobin: 8.5 g/dL — ABNORMAL LOW (ref 12.0–16.0)

## 2024-02-24 LAB — GLUCOSE, CAPILLARY
Glucose-Capillary: 105 mg/dL — ABNORMAL HIGH (ref 70–99)
Glucose-Capillary: 117 mg/dL — ABNORMAL HIGH (ref 70–99)
Glucose-Capillary: 141 mg/dL — ABNORMAL HIGH (ref 70–99)
Glucose-Capillary: 144 mg/dL — ABNORMAL HIGH (ref 70–99)
Glucose-Capillary: 151 mg/dL — ABNORMAL HIGH (ref 70–99)
Glucose-Capillary: 184 mg/dL — ABNORMAL HIGH (ref 70–99)
Glucose-Capillary: 255 mg/dL — ABNORMAL HIGH (ref 70–99)

## 2024-02-24 LAB — AMMONIA: Ammonia: 26 umol/L (ref 9–35)

## 2024-02-24 LAB — HEPARIN LEVEL (UNFRACTIONATED): Heparin Unfractionated: 0.53 [IU]/mL (ref 0.30–0.70)

## 2024-02-24 LAB — TSH: TSH: 5.888 u[IU]/mL — ABNORMAL HIGH (ref 0.350–4.500)

## 2024-02-24 LAB — MAGNESIUM: Magnesium: 2.5 mg/dL — ABNORMAL HIGH (ref 1.7–2.4)

## 2024-02-24 LAB — FOLATE: Folate: 12.7 ng/mL (ref >5.9)

## 2024-02-24 LAB — VITAMIN B12: Vitamin B-12: 616 pg/mL (ref 180–914)

## 2024-02-24 MED ORDER — THIAMINE MONONITRATE 100 MG PO TABS: 100.0000 mg | ORAL_TABLET | Freq: Every day | ORAL | Status: DC

## 2024-02-24 MED ORDER — BETHANECHOL CHLORIDE 10 MG PO TABS
10.0000 mg | ORAL_TABLET | Freq: Three times a day (TID) | ORAL | Status: DC
  Administered 2024-02-24 – 2024-02-27 (×10): 10 mg
  Filled 2024-02-24 (×11): qty 1

## 2024-02-24 MED ORDER — JUVEN PO PACK
1.0000 | PACK | Freq: Two times a day (BID) | ORAL | Status: DC
  Administered 2024-02-24 – 2024-02-27 (×7): 1
  Filled 2024-02-24 (×7): qty 1

## 2024-02-24 MED ORDER — THIAMINE HCL 100 MG/ML IJ SOLN
500.0000 mg | Freq: Every day | INTRAVENOUS | Status: DC
  Administered 2024-02-24 – 2024-02-27 (×4): 500 mg via INTRAVENOUS
  Filled 2024-02-24 (×4): qty 5
  Filled 2024-02-24: qty 500

## 2024-02-24 MED ORDER — VITAL 1.5 CAL PO LIQD
1000.0000 mL | ORAL | Status: DC
  Administered 2024-02-24 – 2024-02-27 (×5): 1000 mL
  Filled 2024-02-24: qty 1000

## 2024-02-24 MED ORDER — PROSOURCE TF20 ENFIT COMPATIBL EN LIQD
60.0000 mL | Freq: Three times a day (TID) | ENTERAL | Status: DC
  Administered 2024-02-24 – 2024-02-27 (×9): 60 mL
  Filled 2024-02-24 (×9): qty 60

## 2024-02-24 NOTE — Progress Notes (Signed)
 Patient throwing frequent PVC's with  increased levophed . I turned down pt fluid removal on CRRT  to improve BP without having to increase Levophed . Patient currently running at net 0.

## 2024-02-24 NOTE — Plan of Care (Signed)
   Problem: Education: Goal: Knowledge of General Education information will improve Description Including pain rating scale, medication(s)/side effects and non-pharmacologic comfort measures Outcome: Progressing

## 2024-02-24 NOTE — Procedures (Signed)
 [name]  MRN: [id number]  Epilepsy Attending: Arleene Lack  Referring Physician/Provider: Brayton Calin, MD  Date: 02/24/2024 Duration: 23.14 mins  Patient history: 68yo M with ams. EEG to evaluate for seizure  Level of alertness: Awake/ lethargic   AEDs during EEG study: Clonazepam   Technical aspects: This EEG study was done with scalp electrodes positioned according to the 10-20 International system of electrode placement. Electrical activity was reviewed with band pass filter of 1-70Hz , sensitivity of 7 uV/mm, display speed of 40mm/sec with a 60Hz  notched filter applied as appropriate. EEG data were recorded continuously and digitally stored.  Video monitoring was available and reviewed as appropriate.  Description: EEG showed continuous generalized predominantly   5 to 7 Hz theta slowing admixed with intermittent 2-3hz  delta slowing. Hyperventilation and photic stimulation were not performed.      ABNORMALITY - Continuous slow, generalized   IMPRESSION: This study is suggestive of moderate diffuse encephalopathy. No seizures or epileptiform discharges were seen throughout the recording.   Caisley Baxendale O Rosana Farnell

## 2024-02-24 NOTE — Progress Notes (Signed)
 Nutrition Follow-up  DOCUMENTATION CODES:   Obesity unspecified  INTERVENTION:   Tube Feeding via Cortrak:  Increase Vital 1.5 to 65 ml/hr Reduce Pro-Source TF20 60 mL TID TF provides 2580 kcals, 165 g of protein, 1186 mL free water    Continue Renal MVI  Add Juven BID to promote wound healing, each packet provides 80 calories, 8 grams of carbohydrate, 2.5  grams of protein (collagen), 7 grams of L-arginine and 7 grams of L-glutamine; supplement contains CaHMB, Vitamins C, E, B12 and Zinc   NUTRITION DIAGNOSIS:   Increased nutrient needs related to acute illness as evidenced by estimated needs.  Being addressed via TF   GOAL:   Patient will meet greater than or equal to 90% of their needs  Met via TF   MONITOR:   Vent status, Labs, I & O's, Diet advancement, TF tolerance  REASON FOR ASSESSMENT:   Ventilator    ASSESSMENT:   68 yo male admitted with STEMI requiring extensive RCA stenting; post op chest pain continued and work-up revealed acute type A dissection with ascending aortic aneurysm and taking to OR emergent repair. PMH includes HTN, HLD  4/15 Cath Lab for RCA Stent, post op acute type A dissection  4/16 OR early AM for repair plus Bentall 4/18 Extubated 4/19 Re-Intubated, Bronch  4/21 TF started 4/23 Cortrak placed 5/01 CRRT initiated  5/03 CRRT stopped 5/02 Trach placed 5/06 MRI brain: acute infarct left parietal junction, subacute infarctions left occipital lobes.  5/07 CRRT restarted  Pt remains on vent support via trach CRRT continues, requires levophed  and vasopressin  due to support BP  Vital 1.5 infusing at 45 ml/hr, noted TF order changed by MD on 5/5. RD recommended goal of 55 ml/hr. Pro-Source TF20 60 mL QID continues   Noted WOC evaluated yesterday, pt with stage 3 PI to apex of gluteal cleft, surrounding tissue consistent with DTI. Noted picture in chart  Noted high dose thiamine initiated today per MD; 500 mg IV x 5 days followed by 100  mg daily per tube  Significant edema persists, weight down to 114.9 kg. Lowest wt this admission UOP 575 mL in 24 hours, 500 mL documented thus far today  +stool via FMS, 275 mL in 24 hours, 95 mL thus far today  Micronutrient Labs (no CRP):  Folate 12.7 (wdl) Vitamin B12 616 (wdl)  Labs: CBGs 112-191 (goal 140-180) BUN 63 (H) Creatinine wdl Electrolytes wdl  Meds:  Midodrine SS Novolog  Novolog  5 units q 4 hours Semglee  20 units BID   Diet Order:   Diet Order     None       EDUCATION NEEDS:   Not appropriate for education at this time  Skin:  Skin Assessment: Skin Integrity Issues: Skin Integrity Issues:: Stage III Stage III: buttocks (surround tissue consistent with DTI) Incisions: Chest incisions  Last BM:  5/8 via rectal tube  Height:   Ht Readings from Last 1 Encounters:  02/05/24 6\' 1"  (1.854 m)    Weight:   Wt Readings from Last 1 Encounters:  02/24/24 114.9 kg    Ideal Body Weight:  83.6 kg  BMI:  Body mass index is 33.42 kg/m.  Estimated Nutritional Needs:   Kcal:  2400-2600 kcal  Protein:  150-170 g  Fluid:  >2L/day   Norvel Beer MS, RDN, LDN, CNSC Registered Dietitian 3 Clinical Nutrition RD Inpatient Contact Info in Amion

## 2024-02-24 NOTE — Progress Notes (Signed)
 PHARMACY - ANTICOAGULATION CONSULT NOTE  Pharmacy Consult for UFH IV Infusion Indication: atrial fibrillation / LLE DVT  Allergies  Allergen Reactions   Bee Venom     Patient Measurements: Height: 6\' 1"  (185.4 cm) Weight: 114.9 kg (253 lb 4.9 oz) IBW/kg (Calculated) : 79.9 HEPARIN  DW (KG): 106  Vital Signs: Temp: 98.7 F (37.1 C) (05/08 0400) Temp Source: Oral (05/08 0400) BP: 95/45 (05/08 0733) Pulse Rate: 62 (05/08 0700)  Labs: Recent Labs    02/22/24 0327 02/22/24 0801 02/23/24 0358 02/23/24 1840 02/23/24 2023 02/24/24 0407  HGB  --    < > 7.5*  --  8.1* 8.2*  HCT  --    < > 26.1*  --  27.1* 27.6*  PLT  --    < > 569*  --  540* 507*  APTT  --   --   --   --   --  130*  HEPARINUNFRC 0.32  --  0.55  --   --  0.53  CREATININE 1.58*   < > 1.60* 1.20 1.18 1.00   < > = values in this interval not displayed.    Estimated Creatinine Clearance: 93.9 mL/min (by C-G formula based on SCr of 1 mg/dL).   Medical History: Past Medical History:  Diagnosis Date   Hypertension     Medications:   Infusions:   amiodarone  30 mg/hr (02/24/24 0700)   dexmedetomidine  (PRECEDEX ) IV infusion     feeding supplement (VITAL 1.5 CAL) 45 mL/hr at 02/24/24 0700   heparin  2,000 Units/hr (02/24/24 0700)   meropenem  (MERREM ) IV Stopped (02/24/24 1610)   norepinephrine  (LEVOPHED ) Adult infusion 4 mcg/min (02/24/24 0700)   prismasol  BGK 4/2.5 400 mL/hr at 02/24/24 0018   prismasol  BGK 4/2.5 400 mL/hr at 02/24/24 0017   prismasol  BGK 4/2.5 1,500 mL/hr at 02/23/24 1831   vancomycin  Stopped (02/23/24 1800)   vasopressin  0.03 Units/min (02/24/24 0700)   PRN: albuterol , heparin , heparin , HYDROmorphone (DILAUDID) injection, ondansetron  (ZOFRAN ) IV, mouth rinse, polyvinyl alcohol , sodium chloride  flush    Assessment: Dustin Davenport is 68 y.o. male with PMH HTN and HLD. Admitted with STEMI now with CGS. Found to have AAA requiring emergent surgical repair. Now s/p Bentall/AVR.  Pharmacy asked  to start IV heparin  4/27 for afib.  LLE DVT and superficial thrombus in LUE found on doppler 4/28.  Heparin  level is slightly supratherapeutic at 0.53 (but close to goal), on heparin  infusion at 2000 units/hr. Hgb stable 8 plt stable 500. Slight oozing at IV site over night but resolved this am. Filter clotted x1 with CRRT last night - will continue systemic heparin  drip for today    Goal of Therapy:  Heparin  level 0.3-0.5 units/ml Monitor platelets by anticoagulation protocol: Yes   Plan:  Continue heparin  infusion at 2000 units/hr Daily heparin  level and CBC Monitor s/s bleeding.  [name] Pharm.D. CPP, BCPS Clinical Pharmacist [phone] 02/24/2024 7:50 AM    Please check AMION for all [facility] Pharmacy phone numbers After 10:00 PM, call Main Pharmacy [phone]

## 2024-02-24 NOTE — Progress Notes (Signed)
 Dustin Davenport, MRN:  [id number], DOB:  Dec 24, 1955, LOS: 23 ADMISSION DATE:  02/01/2024, CONSULTATION DATE:  02/03/24 REFERRING MD:  Sherene Dilling, CHIEF COMPLAINT:  chest pain   History of Present Illness:  68 year old man w/ hx of HTN, obesity who presented on 4/15 with sudden onset chest pain.  EKG showing inferior STEMI and went to cath lab for RCA DES x 3.  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  Taken emergently to OR for Bentall+ ascending/arch replacement.  Procedure complicated by coagulopathy related to antiplatelet therapy from recent stent, multiple liters of blood products given.  Returned to ICU 4/16 on vent and PCCM consulted to assist with vent wean on 4/17.  Currently heavily sedated; attempts at weaning resulted in agitation without ability to redirect.  Pertinent  Medical History   Past Medical History:  Diagnosis Date   Hypertension     Significant Hospital Events: Including procedures, antibiotic start and stop dates in addition to other pertinent events   4/16 RCA stent then Bentall, aortic arch replacement 4/18 extubated 4/19 reintubated in afternoon for aspiration, bronch, Delirious, low grade fevers, expressive aphasia 4/20 but mental status precludes extubation 4/21 started spiking fever again, meropenem  switched back from Zosyn , on vancomycin  as well 02/07/22 failed spontaneous breathing trial 4/24 Tachypnea, fever. Resp culture sent  4/25 underwent right heart cath showing normal filling pressure 4/26 remained in A-fib, became afebrile, Precedex  was switched to low-dose propofol  5/2 trach 5/3 ongoing fevers, leukocytosis; vanc/meropenem  started; CRRT held 5/4 HD catheter removed for ongoing leukocytosis  Interim History / Subjective:  No events, needed some PRNs overnight for perceived pain vs. agitation.  Objective   Blood pressure 104/65, pulse 68, temperature  98.8 F (37.1 C), temperature source Axillary, resp. rate 19, height 6\' 1"  (1.854 m), weight 114.9 kg, SpO2 96%. CVP:  [3 mmHg-89 mmHg] 9 mmHg  Vent Mode: PRVC FiO2 (%):  [40 %-50 %] 40 % Set Rate:  [10 bmp-20 bmp] 10 bmp Vt Set:  [570 mL] 570 mL PEEP:  [8 cmH20] 8 cmH20 Plateau Pressure:  [19 cmH20-23 cmH20] 23 cmH20   Intake/Output Summary (Last 24 hours) at 02/24/2024 4098 Last data filed at 02/24/2024 1191 Gross per 24 hour  Intake 4835.28 ml  Output 3983.2 ml  Net 852.08 ml   Filed Weights   02/22/24 0500 02/23/24 0415 02/24/24 0400  Weight: 120.8 kg 114.8 kg 114.9 kg    Examination: Sleepy Opens eyes with enough prompting Occasional following commands R>L Abd soft, hypoactive BS Ext minimal edema  Net even Labs look okay  Resolved Hospital Problem list   Constipation Shock combined cardiogenic/septic Hypokalemia/hypophosphatemia Shock liver, improving Perioperative coagulopathy  Ileus, resolved- off reglan  Constipation, resolved  Assessment & Plan:  Acute inferior wall STEMI status post PCI and DES 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 secretions Acute kidney injury due to ischemic ATN from shock, stable CRRT held 5/3; no immediate need to restart at this time; primarily done for uremia- see discussion below Mixed anoxic, uremic encephalopathy plus ICU delirium- prognosticating is challenging at this point but he has plateau'd with mental status improvement Perioperative acute blood loss anemia- stable; now just related  to bone marrow stunning and phlebotomy Obesity Leukocytosis- improving slowly Uncontrolled hyperglycemia; A1c  5.3 Hypokalemia Stage 1 sacral ulcer LLE DVT- on heparin  gtt  See discussion yesterday, going to hold all sedating meds and have neurology take a look.  We are now 3 weeks in without significant neurological improvement.  Much could still be delirium but MRI does show damage and never have had robust emergence only getting to point of following simple commands intermittently; need a multidisciplinary eval to determine what timeline for recovery we are looking at, if any.  Patient has stated to wife he would not want prolonged life support.  We may be getting to point where we are doing more to [place] than he would have wanted.   Wife also agreeable to talk to palliative.  - Appreciate neuro and palliative input - TCTS following sternal scar which is healing well - Wean to TC as able - Continue scheduled oxy (decreased yesterday but needed more PRNs overnight?), I guess when wakes up looks uncomfortable so CPOT high leading to med administration - unna unilaterally (DVT on other side) - Continue CRRT even to neg pull normalizing BUN for prognostication - continue [facility] pending surgical plans - 7 days abx for sepsis NOS, end date in place  Best Practice (right click and "Reselect all SmartList Selections" daily)   Diet/type: Tube feeds DVT prophylaxis heparin  gtt Pressure ulcer(s): yes, see nursing documentation GI prophylaxis: PPI Lines: Central line picc Foley:  Yes, and it is still needed Code Status:  full code Last date of multidisciplinary goals of care discussion: 5/8 updated at bedside  33 min cc time  Josiah Nigh, MD 02/24/24 9:28 AM [facility]  For contact information, see Amion. If no response to pager, please call PCCM consult pager. After hours, 7PM- 7AM, please call Elink.

## 2024-02-24 NOTE — Progress Notes (Signed)
 Patient ID: Dustin Davenport, male   DOB: 1956-06-01, 68 y.o.   MRN: [id number]  Patient started on CRRT yesterday for worsening azotemia and the need to provide extracorporeal clearance to allow for accurate neurological prognostication.  He is being kept net even from a fluid standpoint however, currently noted to have diminishing urine output and I will order for ultrafiltration of 50-100 cc/h to get him closer to euvolemic.  Maintain current CRRT fluids with BGK 4/2.5 pre and post filter/dialysate. He remains unresponsive and overall prognosis for recovery is poor.  Clevester Dally MD [facility]. Office # [phone] Pager # [phone] 7:54 AM

## 2024-02-24 NOTE — Consult Note (Signed)
 NEUROLOGY CONSULT NOTE   Date of service: Feb 24, 2024 [name] MRN:  [id number] DOB:  [birth year] Chief Complaint: "encephalophathy" Requesting Provider: Josiah Nigh, MD  History of Present Illness  Dustin Davenport is a 68 y.o. male with hx of hypertension, obesity who presented on 4/15 with sudden onset chest pain, consistent with STEMI status post Cath Lab for RCA DES x 3. 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 Procedure complicated by coagulopathy related to antiplatelet therapy from recent stent, multiple liters of blood products given. Returned to ICU 4/16 on vent and PCCM consulted to assist with vent wean on 4/17. Neurology was consulted in the setting of altered mental status and strokes found on MRI.   On assessment today, patient's wife is bedside for support and contributive to history.  Wife states patient becomes more agitated when weaned off sedation.  He is unable to verbally state his opinions, however she  suspects that at times he is in pain due to groans, moans and movements in the bed. She states patient was able to answer with head nods last week briefly, however has not been able to communicate as clearly since.  At baseline patient has no issues with mentation and is able to communicate effectively.   LKN: unclear as patient had surgery and since then has not been back to baseline Event happened in hospital most likely during surgery or due to Afib No tpa and no theombectomy as no as outside window mRS 1    ROS  Unable to ascertain due to altered mental status and limited ability to follow commands  Past History   Past Medical History:  Diagnosis Date   Hypertension     Past Surgical History:  Procedure Laterality Date   BENTALL PROCEDURE N/A 02/01/2024   Procedure: BENTALL PROCEDURE  USING A KONECT RESILIA AORTIC VALVE CONDUIT;  Surgeon: Bartley Lightning, MD;  Location: [facility] OR;  Service: Open Heart Surgery;  Laterality: N/A;   CORONARY/GRAFT ACUTE MI REVASCULARIZATION N/A 02/01/2024   Procedure: Coronary/Graft Acute MI Revascularization;  Surgeon: [place], Peter M, MD;  Location: [facility] INVASIVE CV LAB;  Service: Cardiovascular;  Laterality: N/A;   INTRAOPERATIVE TRANSESOPHAGEAL ECHOCARDIOGRAM N/A 02/01/2024   Procedure: ECHOCARDIOGRAM, TRANSESOPHAGEAL, INTRAOPERATIVE;  Surgeon: Bartley Lightning, MD;  Location: [facility] OR;  Service: Open Heart Surgery;  Laterality: N/A;   RIGHT HEART CATH N/A 02/11/2024   Procedure: RIGHT HEART CATH;  Surgeon: [name], MD;  Location: [facility] INVASIVE CV LAB;  Service: Cardiovascular;  Laterality: N/A;   THORACIC AORTIC ANEURYSM REPAIR N/A 02/01/2024   Procedure: REPAIR OF ASCENDING AORTIC ANEURYSM USING A [id number] HEMASHIELD PLATINUM GRAFT.;  Surgeon: Bartley Lightning, MD;  Location: [facility] OR;  Service: Open Heart Surgery;  Laterality: N/A;  Repair of Aortic Dissection, Right Axillary Cannulation.    Family History: No family history on file.  Social History  reports that he has never smoked. He does not have any smokeless tobacco history on file. He reports that he does not currently use alcohol . He reports current drug use. Drug: Amphetamines.  Allergies  Allergen Reactions   Bee Venom     Medications   Current Facility-Administered Medications:    0.9 %  sodium chloride  infusion (Manually program via Guardrails IV Fluids), , Intravenous, Once, Paliwal, Aditya, MD   acetaminophen  (TYLENOL ) tablet 650 mg, 650  mg, Per Tube, Q6H, Josiah Nigh, MD, 650 mg at 02/24/24 7829   albuterol  (PROVENTIL ) (2.5 MG/3ML) 0.083% nebulizer solution 2.5 mg, 2.5 mg, Nebulization, Q6H PRN, Trevor Fudge, MD, 2.5 mg at 02/15/24 1124   amiodarone  (NEXTERONE  PREMIX) 360-4.14 MG/200ML-% (1.8 mg/mL) IV infusion, 30 mg/hr, Intravenous, Continuous, Darlis Eisenmenger, MD, Last Rate: 16.67 mL/hr at 02/24/24 0800, 30 mg/hr at 02/24/24 0800   Chlorhexidine  Gluconate Cloth 2 % PADS 6 each, 6 each, Topical, Daily, Bartle, Bryan K, MD, 6 each at 02/24/24 0400   clonazePAM  (KLONOPIN ) tablet 0.5 mg, 0.5 mg, Per Tube, BID, Josiah Nigh, MD, 0.5 mg at 02/23/24 2149   clopidogrel  (PLAVIX ) tablet 75 mg, 75 mg, Oral, Daily, Bartle, Bryan K, MD, 75 mg at 02/23/24 1057   dexmedetomidine  (PRECEDEX ) 400 MCG/100ML (4 mcg/mL) infusion, 0-1.2 mcg/kg/hr, Intravenous, Titrated, Josiah Nigh, MD   feeding supplement (PROSource TF20) liquid 60 mL, 60 mL, Per Tube, QID, Joesph Mussel, DO, 60 mL at 02/23/24 2148   feeding supplement (VITAL 1.5 CAL) liquid 1,000 mL, 1,000 mL, Per Tube, Continuous, Melodie Spry, MD, Last Rate: 45 mL/hr at 02/24/24 0800, Infusion Verify at 02/24/24 0800   free water  300 mL, 300 mL, Per Tube, Q4H, Melodie Spry, MD, 300 mL at 02/24/24 0405   Gerhardt's butt cream, , Topical, Daily, Joesph Mussel, DO, Given at 02/23/24 1057   heparin  ADULT infusion 100 units/mL (25000 units/250mL), 2,000 Units/hr, Intravenous, Continuous, Josiah Nigh, MD, Last Rate: 20 mL/hr at 02/24/24 0800, 2,000 Units/hr at 02/24/24 0800   heparin  injection 1,000-6,000 Units, 1,000-6,000 Units, CRRT, PRN, Melodie Spry, MD, 2,800 Units at 02/24/24 0540   heparinized saline (2000 units/L) primer fluid for CRRT, , CRRT, PRN, Melodie Spry, MD, Given at 02/24/24 0450   HYDROmorphone (DILAUDID) injection 1-2 mg, 1-2 mg, Intravenous, Q3H PRN, Josiah Nigh, MD, 2 mg at 02/24/24 0355   insulin  aspart (novoLOG ) injection 0-20 Units, 0-20 Units, Subcutaneous, Q4H, Chand, Sudham, MD, 4 Units at 02/24/24 0404   insulin  aspart (novoLOG ) injection 5 Units, 5 Units, Subcutaneous, Q4H, Joesph Mussel, DO, 5 Units at 02/24/24 0405   insulin  glargine-yfgn (SEMGLEE ) injection 20 Units, 20 Units, Subcutaneous, BID, Josiah Nigh, MD, 20 Units at 02/23/24 2149   meropenem  (MERREM ) 1 g in  sodium chloride  0.9 % 100 mL IVPB, 1 g, Intravenous, Q8H, Josiah Nigh, MD, Stopped at 02/24/24 [phone]   midodrine (PROAMATINE) tablet 10 mg, 10 mg, Per Tube, TID WC, Josiah Nigh, MD, 10 mg at 02/23/24 1700   multivitamin (RENA-VIT) tablet 1 tablet, 1 tablet, Oral, QHS, Josiah Nigh, MD, 1 tablet at 02/23/24 2149   norepinephrine  (LEVOPHED ) 16 mg in (0.064 mg/mL) premix infusion, 0-40 mcg/min, Intravenous, Titrated, Bartley Lightning, MD, Last Rate: 3.75 mL/hr at 02/24/24 0800, 4 mcg/min at 02/24/24 0800   ondansetron  (ZOFRAN ) injection 4 mg, 4 mg, Intravenous, Q6H PRN, Gold, Wayne E, PA-C   Oral care mouth rinse, 15 mL, Mouth Rinse, Q2H, Bartle, Shellie Dials, MD, 15 mL at 02/24/24 3086   Oral care mouth rinse, 15 mL, Mouth Rinse, PRN, Bartle, Bryan K, MD, 15 mL at 02/09/24 1055   oxyCODONE  (Oxy IR/ROXICODONE ) immediate release tablet 5 mg, 5 mg, Per Tube, Q8H, Josiah Nigh, MD, 5 mg at 02/24/24 5784   pantoprazole  (PROTONIX ) injection 40 mg, 40 mg, Intravenous, QHS, Josiah Nigh, MD, 40 mg at 02/23/24 2149   polyvinyl alcohol  (LIQUIFILM TEARS) 1.4 %  ophthalmic solution 1 drop, 1 drop, Both Eyes, PRN, Joesph Mussel, DO, 1 drop at 02/22/24 0844   prismasol  BGK 4/2.5 infusion, , CRRT, Continuous, Melodie Spry, MD, Last Rate: 400 mL/hr at 02/24/24 0018, New Bag at 02/24/24 0018   prismasol  BGK 4/2.5 infusion, , CRRT, Continuous, Melodie Spry, MD, Last Rate: 400 mL/hr at 02/24/24 0017, New Bag at 02/24/24 0017   prismasol  BGK 4/2.5 infusion, , CRRT, Continuous, Melodie Spry, MD, Last Rate: 1,500 mL/hr at 2024-03-11 1831, New Bag at Mar 11, 2024 1831   QUEtiapine  (SEROQUEL ) tablet 50 mg, 50 mg, Per Tube, QHS, Josiah Nigh, MD, 50 mg at 03/11/2024 2149   rosuvastatin  (CRESTOR ) tablet 40 mg, 40 mg, Oral, Daily, Bartle, Bryan K, MD, 40 mg at 03/11/24 1057   sodium chloride  flush (NS) 0.9 % injection 10-40 mL, 10-40 mL, Intracatheter, Q12H, Bartley Lightning, MD, 20 mL at 03/11/2024 1058   sodium  chloride flush (NS) 0.9 % injection 10-40 mL, 10-40 mL, Intracatheter, PRN, Bartley Lightning, MD   vancomycin  (VANCOCIN ) IVPB 1000 mg/200 mL premix, 1,000 mg, Intravenous, Q24H, Josiah Nigh, MD, Stopped at 03-11-24 1800   vasopressin  (PITRESSIN) 20 Units in 100 mL (0.2 unit/mL) infusion-*FOR SHOCK*, 0-0.04 Units/min, Intravenous, Continuous, Sabharwal, Aditya, DO, Last Rate: 9 mL/hr at 02/24/24 0800, 0.03 Units/min at 02/24/24 0800  Vitals   Vitals:   02/24/24 0645 02/24/24 0700 02/24/24 0733 02/24/24 0800  BP: (!) 97/47 109/60 (!) 95/45 (!) 100/45  Pulse: 64 62  64  Resp: 20 20  20   Temp:      TempSrc:      SpO2: 97% 97%  96%  Weight:      Height:        Body mass index is 33.42 kg/m.  Physical Exam   Constitutional: Appears well-developed, well-nourished, NG tube in place, ill appearing Neuro: Opens eyes to repeated tactile and verbal stimulation, did follow one command ( wiggle toes) but not others, didn't track examiner, PERLA, spontaneously moving BL UE with anti gravity strength, withdraws to noxious stiimuli in BL LE  NIHSS 19   INPUTS: 1A: Level of consciousness --> 2 = Requires repeated stimulation to arouse 1B: Ask month and age --> 2 = 0 questions right  1C: 'Blink eyes' & 'squeeze hands' --> 2 = Performs 0 tasks 2: Horizontal extraocular movements --> 0 = Normal 3: Visual fields --> 0 = No visual loss 4: Facial palsy --> 0 = Normal symmetry 5A: Left arm motor drift --> 2 = Some effort against gravity 5B: Right arm motor drift --> 2 = Some effort against gravity 6A: Left leg motor drift --> 3 = No effort against gravity 6B: Right leg motor drift --> 3 = No effort against gravity 7: Limb Ataxia --> 0 = Does not understand 8: Sensation --> 0 = Normal; no sensory loss 9: Language/aphasia --> 3 = Mute/global aphasia: no usable speech/auditory comprehension 10: Dysarthria --> 0 = Intubated/unable to test 11: Extinction/inattention --> 0 = No  abnormality    Labs/Imaging/Neurodiagnostic studies   CBC:  Recent Labs  Lab 03/11/2024 2023 02/24/24 0407  WBC 13.1* 12.0*  HGB 8.1* 8.2*  HCT 27.1* 27.6*  MCV 89.7 91.7  PLT 540* 507*   Basic Metabolic Panel:  Lab Results  Component Value Date   NA 139 02/24/2024   K 3.8 02/24/2024   CO2 24 02/24/2024   GLUCOSE 212 (H) 02/24/2024   BUN 64 (H) 02/24/2024   CREATININE 1.00 02/24/2024  CALCIUM  8.2 (L) 02/24/2024   GFRNONAA >60 02/24/2024   GFRAA >60 12/19/2015   Lipid Panel:  Lab Results  Component Value Date   LDLCALC 93 02/01/2024   HgbA1c:  Lab Results  Component Value Date   HGBA1C 5.3 02/01/2024   Urine Drug Screen: No results found for: "LABOPIA", "COCAINSCRNUR", "LABBENZ", "AMPHETMU", "THCU", "LABBARB"  Alcohol  Level No results found for: "ETH" INR  Lab Results  Component Value Date   INR 1.5 (H) 02/03/2024   APTT  Lab Results  Component Value Date   APTT 130 (H) 02/24/2024   CT Head without contrast(Personally reviewed): 5/1 1. Normal for age noncontrast CT appearance of the Brain. 2. Intubated.  MRI Brain(Personally reviewed): 5/6 Subcentimeter focus of acute infarction in the deep white matter at the left frontal parietal junction. Subacute infarctions in the deep white matter of the left occipital lobe and temporal lobe. Few punctate foci also present in the right posterior temporal lobe. Findings could be due to embolic disease or global hypoperfusion.  Neurodiagnostics rEEG:  pending   ASSESSMENT   Mycal R Coultas is a 68 y.o. male with hx of hypertension, obesity who presented on 4/15 with sudden onset chest pain, consistent with STEMI status post Cath Lab for RCA DES x 3. Returned to ICU 4/16 on vent and PCCM consulted to assist with vent wean on 4/17. Neurology was consulted in the setting of altered mental status and strokes found on MRI.   Acute encephalopathy Acute ischemic stroke - Etiology of stroke: likely cardioembolic. On  heparin  drip. Ok to transition to PO anticoagulant from neuro standpoint - Strokes are punctate mostly and therefore likely not contributing to encephalopathy as much. DDX include delirium, neurological deterioration in elderly patient due to prolonged hospitalization, toxic-metabolic - Routine EEG to look for epileptogenicity ordered, low suspicion - Follow-up ordered labs: TSH, Vitamin B1, B9, B12, Ammonia  - If Vitamin B12 < 500 mg, would replete  - Thiamine 500 mg daily IV for 5 days, and then continue with Thiamine 100 mg daily afterwards  - can consider increasing Klonopin , to assess improvement with agitation, if worsening can taper off and consider another agent - Delirium Precautions - Neurology will follow  - Dicussed with Dr Felipe Horton via secure chat _____________________________________________________________________  Signed, Brayton Calin, MD [facility]- PGY-1      NEUROHOSPITALIST ADDENDUM Performed a face to face diagnostic evaluation.    I have reviewed the contents of history and physical exam as documented by Resident and agree with above documentation.  I have discussed and formulated the above plan as documented. Edits to the note have been made as needed.  Ashleyann Shoun O Emmogene Simson

## 2024-02-24 NOTE — Progress Notes (Signed)
EEG complete. Results pending.  ?

## 2024-02-24 NOTE — Progress Notes (Signed)
 Speech Language Pathology Discharge Patient Details Name: Dustin Davenport MRN: [id number] DOB: 09/01/1956 Today's Date: 02/24/2024  Pt has not been medically appropriate for SLP intervention.  Our team will sign off.  Please reconsult should PMV assessment be warranted. D/W RN.  Roderic Lammert L. Beatris Lincoln, MA CCC/SLP Clinical Specialist - Acute Care SLP Acute Rehabilitation Services Office number [phone]  Dustin Davenport [name] 02/24/2024, 10:09 AM

## 2024-02-24 NOTE — Consult Note (Signed)
 Palliative Care Consult Note                                  Date: 02/24/2024   [name]  DOB: Sep 03, 1956  MRN: [id number]  Age / Sex: 68 y.o., male  PCP: Annette Barters, MD Referring Physician: Josiah Nigh, MD  Reason for Consultation: Establishing goals of care  HPI/Patient Profile: 68 y.o. male  with past medical history of hypertension who presented to the ED on 02/01/2024 with sudden onset chest pain.  EKG showed inferior STEMI and went to Cath Lab for RCA DES x 3.  Postprocedure had ongoing chest pain with subsequent TTE, TEE, and CTA showing extensive type a dissection with aortic valve failure.  Taken emergently to the OR for Bentall plus ascending/arch replacement. Hospitalization complicated by coagulopathy related to antiplatelet therapy from recent stent, AKI requiring CRRT, cardiogenic/septic shock, acute respiratory failure, bilateral pneumonia, and encephalopathy.  Palliative medicine was consulted for goals of care.   Subjective:   Extensive chart review has been completed prior to meeting with patient/family including labs, vital signs, imaging, progress/consult notes, orders, medications and available advance directive documents.    Update received from RN.  Patient assessed at bedside. He is on CRRT and vasopressor support. He intermittently follows commands.   I met with wife/[name] in the 2H conference room to discuss diagnosis, prognosis, GOC, disposition, and options.  I introduced Palliative Medicine as specialized medical care for people living with serious illness. It focuses on providing relief from the symptoms and stress of a serious illness.   A brief life review was discussed.  Patient and wife have been married for 23 years.  Patient has a daughter and son from a previous marriage.  Patient retired from [employer] after working there for many years.  Prior to admission, patient was  completely independent and fairly active.  He enjoyed working on his truck and working in the yard.  We reviewed patient's complicated hospital course and current clinical status.  We reviewed his multiple issues including STEMI status post PCI, aortic dissection status post emergent aortic reconstruction, cardiogenic/septic shock, acute respiratory failure, pneumonia, AKI, coagulopathy, and encephalopathy.  Wife understands that patient is in severe multiorgan failure requiring multiple forms of life support.  Created space and opportunity for wife to explore thoughts and feelings regarding patient's current medical situation. Values and goals of care were attempted to be elicited.  We reviewed the difference between full scope aggressive interventions versus comfort care. Wife reports speaking with neurologist earlier today and that it was conveyed that patient's strokes are likely not contributing much to his encephalopathy.  She feels somewhat encouraged that patient is following commands, and wants to allow additional time for outcomes.  However, she is starting to contemplate that the timeline needed for recovery may be more than patient is willing to endure. She is clear that patient would not want long-term support, if there was minimal chance he would ultimately be able to return home and have some level of independence.   Discussed code status. I encouraged consideration of DNR/DNI status and provided education on evidence-based poor outcomes in similar hospitalized patients, as the cause of cardiac arrest would likely be associated with advanced illness rather than a reversible condition.  Questions and concerns addressed. Wife indicates wanting to have a family meeting sometime next week to include patient's daughter and brother.  Review of Systems  Unable to perform ROS   Objective:   Primary Diagnoses: Present on Admission:  STEMI involving right coronary artery  [facility])   Physical Exam Vitals reviewed.  Constitutional:      General: He is not in acute distress.    Comments: Critically ill-appearing  Pulmonary:     Comments: Trach/vent Neurological:     Mental Status: He is lethargic.     Comments: Following commands     Palliative Assessment/Data: PPS 30%     Assessment & Plan:   SUMMARY OF RECOMMENDATIONS   Code status changed to DNR with interventions Continue full scope supportive interventions Allow time for outcomes, wife is hopeful for continued improvement of neurologic status Ongoing palliative support  Primary Decision Maker: NEXT OF KIN  Prognosis:  Unable to determine  Discharge Planning:  To Be Determined   Discussed with: Dr. Felipe Horton and RN   Thank you for allowing us  to participate in the care of Koltin R Maggard   Time Total: 90 minutes  Detailed review of medical records (labs, imaging, vital signs), medically appropriate exam, discussed with treatment team, counseling and education to family, & staff, documenting clinical information, coordination of care.   Signed by: Maisie Scotland, NP Palliative Medicine Team  Team Phone # [phone]  For individual providers, please see AMION

## 2024-02-24 NOTE — Progress Notes (Signed)
 13 Days Post-Op Procedure(s) (LRB): RIGHT HEART CATH (N/A) Subjective:  Put back on CRRT yesterday to bring BUN down to see if neuro status improves to aid in determining goals of care.  He has required low dose NE and vaso on CRRT to support BP.  Has required some dilaudid overnight for agitation affecting CRRT flow.  Objective: Vital signs in last 24 hours: Temp:  [98.1 F (36.7 C)-98.7 F (37.1 C)] 98.7 F (37.1 C) (05/08 0400) Pulse Rate:  [51-66] 62 (05/08 0700) Cardiac Rhythm: Normal sinus rhythm (05/08 0400) Resp:  [15-25] 20 (05/08 0700) BP: (73-170)/(32-123) 109/60 (05/08 0700) SpO2:  [97 %-100 %] 97 % (05/08 0700) FiO2 (%):  [50 %] 50 % (05/08 0400) Weight:  [114.9 kg] 114.9 kg (05/08 0400)  Hemodynamic parameters for last 24 hours: CVP:  [3 mmHg-89 mmHg] 10 mmHg  Intake/Output from previous day: 05/07 0701 - 05/08 0700 In: 4440.9 [I.V.:1284.6; Blood:316; NG/GT:2240; IV Piggyback:600.3] Out: 3750.2 [Urine:575; Stool:275] Intake/Output this shift: No intake/output data recorded.  General appearance: trach on vent Neurologic: eyes closed, does not respond to voice. Moves ext to stimulation. Heart: regular rate and rhythm Lungs: clear to auscultation bilaterally Abdomen: soft, non-tender; bowel sounds normal Extremities: edema mild Wound: incisions healing well.  Lab Results: Recent Labs    02/23/24 2023 02/24/24 0407  WBC 13.1* 12.0*  HGB 8.1* 8.2*  HCT 27.1* 27.6*  PLT 540* 507*   BMET:  Recent Labs    02/23/24 2023 02/24/24 0407  NA 143 139  K 3.7 3.8  CL 108 105  CO2 26 24  GLUCOSE 130* 212*  BUN 96* 64*  CREATININE 1.18 1.00  CALCIUM  8.7* 8.2*    PT/INR: No results for input(s): "LABPROT", "INR" in the last 72 hours. ABG    Component Value Date/Time   PHART 7.353 02/12/2024 2335   HCO3 23.2 02/12/2024 2335   TCO2 24 02/12/2024 2335   ACIDBASEDEF 2.0 02/12/2024 2335   O2SAT 72.3 02/24/2024 0548   CBG (last 3)  Recent Labs     02/24/24 0009 02/24/24 0402 02/24/24 0404  GLUCAP 117* 255* 151*   CXR yesterday stable with small left effusion and basilar atelectasis  Assessment/Plan:  He is relatively stable overall. Limiting factors appears to be neuro status and renal function in further progression. MRI showed small strokes in multiple locations that could be embolic or global hypoperfusion. His dissection involved the carotid arteries and either of these problems could result. He also had altered perfusion of the left kidney related to the dissection which may not have improved with repair. I think if neuro status does not significantly improve with normalizing his BUN he is unlikely to gain functional recovery as time passes.   LOS: 23 days    Dustin Davenport 02/24/2024

## 2024-02-24 NOTE — Progress Notes (Addendum)
 Advanced Heart Failure Rounding Note  Cardiologist: None   Chief Complaint: STEMI> Cardiogenic shock> Now s/p aortic dissection repair + Bentall  Patient Profile   Dustin Davenport is a 68 y.o. male with HTN and HLD. Admitted with STEMI now with CGS. Found to have AAA and taken emergently to the OR. Now s/p Bentall/AVR and reimplantation of coronaries.  Significant events:    - Echo 4/15: AAA measuring 5-5.7 cm. Followed by emergent TEE 4/15 which was significant for torrential aortic regurgitation and dissection flap extending from the aortic root to the proximal ascending aorta  - CT C/A/P: Showed acute type A dissection with a 5.5 cm ascending aortic aneurysm extending into arch. Dissection goes down to infrarenal aorta and extends up into innominate and left common carotid artery - 4/16 Underwent aortic dissection repair (Bentall/AVR and reimplantation of coronaries. Intra-op course c/b coagulopathy due to Effient  (PCI of RCA earlier in the day)).  - Extubated 4/18 - Re-intubated 4/19. Bronch with diffuse mucopurulent secretions - 4/26 Afib with post conversion pause. Amiodarone  decreased.  - 4/28 Started back on  Vanc + Meropenum and Micafungin  added 4/28 per CCM. Resp Cx + Candida   - 4/28 LLE Venous Doppler +DVT (gastroc vein), LUE Venous Doppler + for superficial cephalic vein thrombosis  -5/1 CT head. No acute findings. Started on CRRT.  -No acute events overnight.  -5/3 CRRT stopped.  -5/5 Diuresed with IV lasix . Echo EF 40% RV normal RA moderately dilated.IVC dilated. - 5/6 MRI Brain - with acute infarct left parietal junction, subacute infarctions left occipital lobes. -5/7 CRRT restarted.    Subjective:    Remains on Vaso 0.03 units + Norepi 4 mcg.   On CRRT  No  purposeful movement.   Objective:    Weight Range: 114.9 kg Body mass index is 33.42 kg/m.   Vital Signs:   Temp:  [98.1 F (36.7 C)-98.8 F (37.1 C)] 98.8 F (37.1 C) (05/08 0914) Pulse Rate:   [51-68] 68 (05/08 0914) Resp:  [15-25] 19 (05/08 0914) BP: (73-170)/(32-123) 104/65 (05/08 0914) SpO2:  [95 %-100 %] 96 % (05/08 0914) FiO2 (%):  [40 %-50 %] 40 % (05/08 0733) Weight:  [114.9 kg] 114.9 kg (05/08 0400) Last BM Date : 02/23/24  Weight change: Filed Weights   02/22/24 0500 02/23/24 0415 02/24/24 0400  Weight: 120.8 kg 114.8 kg 114.9 kg   Intake/Output:  Intake/Output Summary (Last 24 hours) at 02/24/2024 1006 Last data filed at 02/24/2024 0912 Gross per 24 hour  Intake 4929.68 ml  Output 4121.3 ml  Net 808.38 ml  CVP 12-13 Physical Exam  General:   Appears  weak. On CRRT Neck: Trach. JVP difficult to assess.  LIJ HD cath Cor: PMI nondisplaced. Regular rate & rhythm. No rubs, gallops or murmurs. Lungs: Coarse throughout.  Abdomen: soft, nontender, nondistended.  Extremities: no cyanosis, clubbing, rash, edema Neuro:Opens eyes. No purposeful movement.  Telemetry   SR with PACs   Labs    CBC Recent Labs    02/23/24 2023 02/24/24 0407  WBC 13.1* 12.0*  HGB 8.1* 8.2*  HCT 27.1* 27.6*  MCV 89.7 91.7  PLT 540* 507*   Basic Metabolic Panel Recent Labs    40/98/11 2023 02/24/24 0407  NA 143 139  K 3.7 3.8  CL 108 105  CO2 26 24  GLUCOSE 130* 212*  BUN 96* 64*  CREATININE 1.18 1.00  CALCIUM  8.7* 8.2*  MG  --  2.5*  PHOS 4.2 3.1  Liver Function Tests Recent Labs    02/23/24 2023 02/24/24 0407  ALBUMIN  2.5* 2.4*   D-Dimer No results for input(s): "DDIMER" in the last 72 hours.  Hemoglobin A1C No results for input(s): "HGBA1C" in the last 72 hours.  Medications:    Scheduled Medications:  sodium chloride    Intravenous Once   acetaminophen   650 mg Per Tube Q6H   Chlorhexidine  Gluconate Cloth  6 each Topical Daily   clonazePAM   0.5 mg Per Tube BID   clopidogrel   75 mg Oral Daily   feeding supplement (PROSource TF20)  60 mL Per Tube QID   free water   300 mL Per Tube Q4H   Gerhardt's butt cream   Topical Daily   insulin  aspart  0-20  Units Subcutaneous Q4H   insulin  aspart  5 Units Subcutaneous Q4H   insulin  glargine-yfgn  20 Units Subcutaneous BID   midodrine  10 mg Per Tube TID WC   multivitamin  1 tablet Oral QHS   mouth rinse  15 mL Mouth Rinse Q2H   oxyCODONE   5 mg Per Tube Q8H   pantoprazole  (PROTONIX ) IV  40 mg Intravenous QHS   QUEtiapine   50 mg Per Tube QHS   rosuvastatin   40 mg Oral Daily   sodium chloride  flush  10-40 mL Intracatheter Q12H   Infusions:  amiodarone  30 mg/hr (02/24/24 0900)   dexmedetomidine  (PRECEDEX ) IV infusion     feeding supplement (VITAL 1.5 CAL) 45 mL/hr at 02/24/24 0900   heparin  2,000 Units/hr (02/24/24 0900)   meropenem  (MERREM ) IV Stopped (02/24/24 7829)   norepinephrine  (LEVOPHED ) Adult infusion 4 mcg/min (02/24/24 0900)   prismasol  BGK 4/2.5 400 mL/hr at 02/24/24 0018   prismasol  BGK 4/2.5 400 mL/hr at 02/24/24 0017   prismasol  BGK 4/2.5 1,500 mL/hr at 02/23/24 1831   vancomycin  Stopped (02/23/24 1800)   vasopressin  0.03 Units/min (02/24/24 0900)   PRN Medications: albuterol , heparin , heparin , HYDROmorphone (DILAUDID) injection, ondansetron  (ZOFRAN ) IV, mouth rinse, polyvinyl alcohol , sodium chloride  flush   Assessment/Plan   Septic/vasoplegic shock - Now vanc/meropenum ; remains on micafungin .  - Discussed with CCM - MRI - acute infarctions. See below  -On vancomycin  + meropenum.   Acute type A aortic dissection  - Echo 4/15: AAA measuring 5-5.7 cm - Emergent TEE 4/15: significant for torrential aortic regurgitation and dissection flap extending from the aortic root to the proximal ascending aorta  - CT C/A/P: Showed acute type A dissection with a 5.5 cm ascending aortic aneurysm extending into arch. Dissection goes down to infrarenal aorta and extends up into innominate and left common carotid arteries  - Now s/p aortic dissection repair 02/01/24 (Bentall/AVR and reimplantation of coronaries.  2. CAD, Acute inferior STEMI - Admitted with STEMI. Post PCI found to  also have aortic dissection - LHC with single vessel CAD involving the mid to distal RCA, successful PCI of the RCA with overlapping DES x 3. - No aspirin  with heparin  gtt - Continue plavix  75mg  daily - Continue Crestor  40 mg daily - no changes  3. Acute systolic HF ->  Cardiogenic shock - Post perfusion CGS.  - Echo 4/19: EF 25% RV moderate to severely decreased - Echo 5/5: EF 40% RV normal RA moderately dilated.IVC dilated.  -Remains vaso 0.03 + Norepi 4 mcg.   CO-OX 77% -CVP 12-13. CRRT - starting to pull today.   4. Post-op hypoxic resp failure - extubated 4/18.  - re-intubated 4/19 - s/p tracheostomy. Fio2 40%.  - diffuse mucopurulent secretions on bronch  4/19. BAL GS: Mod WBC. Few gram variable rods. Rare yeast - CT C/A/P 4/27 w/ small postoperative seroma around aortic root/graft and possible PNA.  - Resp Cx 4/28 + Candida. Completed  micafungin  5/4 - On meropenum + vanc.    5. PAF with post-termination pauses PVCs - Patient with AF intra-op.  - Maintaining SR. Continue amio 30 mg per hour for now. Consider stopping once off pressors.  - continue heparin  gtt   6. Hypokalemia/hypernatremia -Sodium/Potassium stable.   7. AKI due to ATN/shock - Baseline SCr ~1.3-1.4 - CRRT Stopped 5/3. Restarted CRRT 5/7  - Per Nephrology.   8. ABLA post-op - Received 6uPRBCs, 6 plt, 4 FFP and 5 cryo intra-op.  - 5/7 Got unit of blood. Hgb 7.5>8.2    9. Acute Venous Thrombosis  - +LLE DVT (gastroc vein) - + LUE superficial cephalic venin thrombosis -Continue  heparin  gtt  10. Acute CVA Encephalopathy ,  -- MRI- with acute infarct left parietal junction, subacute infarctions left occipital lobes. -CCM consulting Neuro.    CRITICAL CARE Performed by: Nieves Bars NP-C   Total critical care time: 10 minutes  Critical care time was exclusive of separately billable procedures and treating other patients.  Critical care was necessary to treat or prevent imminent or  life-threatening deterioration.  Critical care was time spent personally by me on the following activities: development of treatment plan with patient and/or surrogate as well as nursing, discussions with consultants, evaluation of patient's response to treatment, examination of patient, obtaining history from patient or surrogate, ordering and performing treatments and interventions, ordering and review of laboratory studies, ordering and review of radiographic studies, pulse oximetry and re-evaluation of patient's condition.   Length of Stay: 23  Nieves Bars, NP  02/24/2024, 10:06 AM  Advanced Heart Failure Team Pager [phone] (M-F; 7a - 5p)  Please contact [facility] for night-coverage after hours (5p -7a ) and weekends on [email or web address]  Patient seen with NP, I formulated the plan and agree with the above note.   He is now back on CVVH, just started pulling UF net negative 50 cc/hr. He has been restarted on NE 4 and vasopressin  0.03.  CVP 9-10 on my read.   He is on vancomycin /meropenem .   He will follow some commands.   General: NAD Neck: JVP difficult, no thyromegaly or thyroid nodule.  Lungs: Clear to auscultation bilaterally with normal respiratory effort. CV: Nondisplaced PMI.  Heart regular S1/S2, no S3/S4, no murmur.  1+ ankle edema.   Abdomen: Soft, nontender, no hepatosplenomegaly, no distention.  Skin: Intact without lesions or rashes.  Neurologic: Eyes are closed but follows some commands.  Extremities: No clubbing or cyanosis.  HEENT: Normal.    Good co-ox at 72%,  back on NE at 4 with CVVH and also vasopressin  0.03. Last. echo with EF 40%, normal RV function, stable bioprosthetic aortic valve, dilated IVC.  CVP 9-10 today.  - Continue midodrine 10 tid.  - Pressors ongoing to allow UF via CVVH.  - Would pull 50 cc/hr net negative via CVVH today.    Trach in place, on vancomycin /meropenem  for empiric PNA coverage.    NSR today, continue amiodarone  and heparin  gtts.     Continue Plavix  and Crestor  with recent PCI.    MRI head with multiple small infarcts, was restarted on CVVH yesterday in setting of uremia and worsened mental status.  He does seem a bit better today with resolution of uremia, he will follow simple commands. Neurology following.  CRITICAL CARE Performed by: Peder Bourdon  Total critical care time: 40 minutes  Critical care time was exclusive of separately billable procedures and treating other patients.  Critical care was necessary to treat or prevent imminent or life-threatening deterioration.  Critical care was time spent personally by me on the following activities: development of treatment plan with patient and/or surrogate as well as nursing, discussions with consultants, evaluation of patient's response to treatment, examination of patient, obtaining history from patient or surrogate, ordering and performing treatments and interventions, ordering and review of laboratory studies, ordering and review of radiographic studies, pulse oximetry and re-evaluation of patient's condition.  Peder Bourdon 02/24/2024 12:00 PM

## 2024-02-24 NOTE — Progress Notes (Signed)
 Consult to assess PIV in swollen LUE.  Carly, RN reports PIV has blood return. Edema is not likely related to PIV as LUE has been swollen in the past week and PIV is patent. Recommend removing PIVs and using PICC for all infusions if possible. RN will discuss with oncoming nurse.

## 2024-02-25 ENCOUNTER — Inpatient Hospital Stay (HOSPITAL_COMMUNITY)

## 2024-02-25 DIAGNOSIS — I639 Cerebral infarction, unspecified: Secondary | ICD-10-CM | POA: Diagnosis not present

## 2024-02-25 DIAGNOSIS — I5020 Unspecified systolic (congestive) heart failure: Secondary | ICD-10-CM | POA: Diagnosis not present

## 2024-02-25 DIAGNOSIS — E876 Hypokalemia: Secondary | ICD-10-CM | POA: Diagnosis not present

## 2024-02-25 DIAGNOSIS — G9340 Encephalopathy, unspecified: Secondary | ICD-10-CM | POA: Diagnosis not present

## 2024-02-25 DIAGNOSIS — I480 Paroxysmal atrial fibrillation: Secondary | ICD-10-CM | POA: Diagnosis not present

## 2024-02-25 DIAGNOSIS — J810 Acute pulmonary edema: Secondary | ICD-10-CM

## 2024-02-25 DIAGNOSIS — J9601 Acute respiratory failure with hypoxia: Secondary | ICD-10-CM

## 2024-02-25 DIAGNOSIS — I2111 ST elevation (STEMI) myocardial infarction involving right coronary artery: Secondary | ICD-10-CM | POA: Diagnosis not present

## 2024-02-25 DIAGNOSIS — A419 Sepsis, unspecified organism: Secondary | ICD-10-CM

## 2024-02-25 DIAGNOSIS — Z9911 Dependence on respirator [ventilator] status: Secondary | ICD-10-CM

## 2024-02-25 LAB — COOXEMETRY PANEL
Carboxyhemoglobin: 0.9 % (ref 0.5–1.5)
Methemoglobin: <0.7 % (ref 0.0–1.5)
O2 Saturation: 67.5 %
Total hemoglobin: 8.3 g/dL — ABNORMAL LOW (ref 12.0–16.0)

## 2024-02-25 LAB — RENAL FUNCTION PANEL
Albumin: 2.4 g/dL — ABNORMAL LOW (ref 3.5–5.0)
Albumin: 2.5 g/dL — ABNORMAL LOW (ref 3.5–5.0)
Anion gap: 9 (ref 5–15)
Anion gap: 7 (ref 5–15)
BUN: 42 mg/dL — ABNORMAL HIGH (ref 8–23)
BUN: 45 mg/dL — ABNORMAL HIGH (ref 8–23)
CO2: 25 mmol/L (ref 22–32)
CO2: 26 mmol/L (ref 22–32)
Calcium: 8.5 mg/dL — ABNORMAL LOW (ref 8.9–10.3)
Calcium: 8.6 mg/dL — ABNORMAL LOW (ref 8.9–10.3)
Chloride: 100 mmol/L (ref 98–111)
Chloride: 100 mmol/L (ref 98–111)
Creatinine, Ser: 0.94 mg/dL (ref 0.61–1.24)
Creatinine, Ser: 0.93 mg/dL (ref 0.61–1.24)
GFR, Estimated: >60 mL/min (ref >60)
GFR, Estimated: >60 mL/min (ref >60)
Glucose, Bld: 184 mg/dL — ABNORMAL HIGH (ref 70–99)
Glucose, Bld: 221 mg/dL — ABNORMAL HIGH (ref 70–99)
Phosphorus: 2.8 mg/dL (ref 2.5–4.6)
Phosphorus: 2.5 mg/dL (ref 2.5–4.6)
Potassium: 4.2 mmol/L (ref 3.5–5.1)
Potassium: 4.6 mmol/L (ref 3.5–5.1)
Sodium: 134 mmol/L — ABNORMAL LOW (ref 135–145)
Sodium: 133 mmol/L — ABNORMAL LOW (ref 135–145)

## 2024-02-25 LAB — CBC
HCT: 28.8 % — ABNORMAL LOW (ref 39.0–52.0)
Hemoglobin: 8.5 g/dL — ABNORMAL LOW (ref 13.0–17.0)
MCH: 26.8 pg (ref 26.0–34.0)
MCHC: 29.5 g/dL — ABNORMAL LOW (ref 30.0–36.0)
MCV: 90.9 fL (ref 80.0–100.0)
Platelets: 489 10*3/uL — ABNORMAL HIGH (ref 150–400)
RBC: 3.17 MIL/uL — ABNORMAL LOW (ref 4.22–5.81)
RDW: 18.6 % — ABNORMAL HIGH (ref 11.5–15.5)
WBC: 13.4 10*3/uL — ABNORMAL HIGH (ref 4.0–10.5)
nRBC: 7.7 % — ABNORMAL HIGH (ref 0.0–0.2)

## 2024-02-25 LAB — GLUCOSE, CAPILLARY
Glucose-Capillary: 120 mg/dL — ABNORMAL HIGH (ref 70–99)
Glucose-Capillary: 126 mg/dL — ABNORMAL HIGH (ref 70–99)
Glucose-Capillary: 129 mg/dL — ABNORMAL HIGH (ref 70–99)
Glucose-Capillary: 129 mg/dL — ABNORMAL HIGH (ref 70–99)
Glucose-Capillary: 139 mg/dL — ABNORMAL HIGH (ref 70–99)
Glucose-Capillary: 161 mg/dL — ABNORMAL HIGH (ref 70–99)
Glucose-Capillary: 181 mg/dL — ABNORMAL HIGH (ref 70–99)

## 2024-02-25 LAB — APTT: aPTT: 90 s — ABNORMAL HIGH (ref 24–36)

## 2024-02-25 LAB — HEPARIN LEVEL (UNFRACTIONATED): Heparin Unfractionated: 0.46 [IU]/mL (ref 0.30–0.70)

## 2024-02-25 LAB — MAGNESIUM: Magnesium: 2.6 mg/dL — ABNORMAL HIGH (ref 1.7–2.4)

## 2024-02-25 MED ORDER — HYDROMORPHONE HCL 1 MG/ML IJ SOLN
1.0000 mg | INTRAMUSCULAR | Status: DC | PRN
  Administered 2024-02-25 – 2024-02-27 (×11): 1 mg via INTRAVENOUS
  Filled 2024-02-25 (×11): qty 1

## 2024-02-25 MED ORDER — QUETIAPINE FUMARATE 100 MG PO TABS
100.0000 mg | ORAL_TABLET | Freq: Every day | ORAL | Status: DC
  Administered 2024-02-25: 100 mg
  Filled 2024-02-25: qty 1

## 2024-02-25 MED ORDER — SODIUM CHLORIDE 0.9 % IV SOLN
1.0000 g | Freq: Three times a day (TID) | INTRAVENOUS | Status: DC
  Administered 2024-02-25 – 2024-02-27 (×6): 1 g via INTRAVENOUS
  Filled 2024-02-25 (×6): qty 20

## 2024-02-25 MED ORDER — VANCOMYCIN HCL IN DEXTROSE 1-5 GM/200ML-% IV SOLN
1000.0000 mg | INTRAVENOUS | Status: DC
  Administered 2024-02-25 – 2024-02-27 (×3): 1000 mg via INTRAVENOUS
  Filled 2024-02-25 (×3): qty 200

## 2024-02-25 MED ORDER — HYDROMORPHONE HCL 1 MG/ML IJ SOLN
0.5000 mg | INTRAMUSCULAR | Status: DC | PRN
  Administered 2024-02-25: 0.5 mg via INTRAVENOUS
  Filled 2024-02-25: qty 0.5

## 2024-02-25 NOTE — Progress Notes (Signed)
 Patient ID: Dustin Davenport, male   DOB: 18-Jun-1956, 68 y.o.   MRN: [id number] [facility] Progress Note   Assessment/ Plan:   1. Acute kidney Injury: Secondary to ischemic ATN in the setting of STEMI/type A aortic dissection.  Restarted back on CRRT about 48 hours ago because of worsening azotemia and its contribution on his decreased mentation/responsiveness.  Continue efforts at augmenting urine output with ultrafiltration on CRRT.  Await repeat evaluation of mental status by neurology. 2.  Coronary artery disease: Status post inferior wall STEMI status post PCI to RCA complicated by acute systolic heart failure.  Hemodynamically stable at this point with decent response to furosemide . 3.  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 4.  Anemia: Secondary to aortic dissection/surgical losses and acute/critical illness.  Continue to monitor for PRBC indications. 5.  Hyponatremia: Medications reviewed-not getting any hypotonic fluids or free water  flushes.  Will continue to monitor on CRRT with UF.  Subjective:   With episodes of PVCs yesterday prompting transient stoppage of ultrafiltration.  Overnight, tolerating 50-100 cc/h net UF.   Objective:   BP (!) 108/93   Pulse 66   Temp 97.6 F (36.4 C) (Axillary)   Resp (!) 28   Ht 6\' 1"  (1.854 m)   Wt 114.5 kg   SpO2 92%   BMI 33.30 kg/m   Intake/Output Summary (Last 24 hours) at 02/25/2024 0826 Last data filed at 02/25/2024 0800 Gross per 24 hour  Intake 3708.22 ml  Output 5309 ml  Net -1600.78 ml   Weight change: -0.4 kg  Physical Exam: Gen: Resting comfortably on tracheal collar.  Wife at bedside CVS: Pulse regular rhythm, normal rate, S1 and S2 distant Resp: Coarse breath sounds without discrete rales or rhonchi Abd: Soft, obese, nontender, bowel sounds normal Ext: 2+ upper extremity edema with 1+ lower extremity edema  Imaging: EEG  adult Result Date: 02/24/2024 Arleene Lack, MD     02/24/2024  4:34 PM [name] MRN: [id number] Epilepsy Attending: Arleene Lack Referring Physician/Provider: Brayton Calin, MD Date: 02/24/2024 Duration: 23.14 mins Patient history: 68yo M with ams. EEG to evaluate for seizure Level of alertness: Awake/ lethargic AEDs during EEG study: Clonazepam  Technical aspects: This EEG study was done with scalp electrodes positioned according to the 10-20 International system of electrode placement. Electrical activity was reviewed with band pass filter of 1-70Hz , sensitivity of 7 uV/mm, display speed of 15mm/sec with a 60Hz  notched filter applied as appropriate. EEG data were recorded continuously and digitally stored.  Video monitoring was available and reviewed as appropriate. Description: EEG showed continuous generalized predominantly   5 to 7 Hz theta slowing admixed with intermittent 2-3hz  delta slowing. Hyperventilation and photic stimulation were not performed.    ABNORMALITY - Continuous slow, generalized  IMPRESSION: This study is suggestive of moderate diffuse encephalopathy. No seizures or epileptiform discharges were seen throughout the recording.  Arleene Lack   DG CHEST PORT 1 VIEW Result Date: 02/23/2024 CLINICAL DATA:  [id number] Central venous catheter in place [id number] EXAM: PORTABLE CHEST 1 VIEW COMPARISON:  Radiograph 02/19/2024 FINDINGS: Introduction of a LEFT central venous line with tip in the mid SVC. No pneumothorax. Stable PICC line, tracheostomy tube and feeding tube. Midline sternotomy.  Small LEFT effusion. IMPRESSION: LEFT central venous line in good position. No pneumothorax. Electronically Signed   By: Deboraha Fallow M.D.   On: 02/23/2024 14:25    Labs: BMET  Recent Labs  Lab 02/22/24 0327 02/22/24 0801 02/23/24 0358 02/23/24 1840 02/23/24 2023 02/24/24 0407 02/24/24 1119 02/25/24 0430  NA 144 146* 147* 119* 143 139 138 134*  K 3.1* 3.4* 3.6 2.9*  3.7 3.8 4.3 4.2  CL 107 111 110 88* 108 105 104 100  CO2 21* 23 27 20* 26 24 25 25   GLUCOSE 151* 130* 98 910* 130* 212* 114* 184*  BUN 108* 101* 126* 78* 96* 64* 63* 42*  CREATININE 1.58* 1.55* 1.60* 1.20 1.18 1.00 1.09 0.94  CALCIUM  9.1 8.3* 9.2 7.0* 8.7* 8.2* 8.5* 8.5*  PHOS 5.3*  --  6.5* 3.6 4.2 3.1 3.4 2.8   CBC Recent Labs  Lab 02/23/24 0358 02/23/24 2023 02/24/24 0407 02/25/24 0430  WBC 17.0* 13.1* 12.0* 13.4*  HGB 7.5* 8.1* 8.2* 8.5*  HCT 26.1* 27.1* 27.6* 28.8*  MCV 92.6 89.7 91.7 90.9  PLT 569* 540* 507* 489*    Medications:     sodium chloride    Intravenous Once   acetaminophen   650 mg Per Tube Q6H   bethanechol   10 mg Per Tube TID   Chlorhexidine  Gluconate Cloth  6 each Topical Daily   clonazePAM   0.5 mg Per Tube BID   clopidogrel   75 mg Oral Daily   feeding supplement (PROSource TF20)  60 mL Per Tube TID   Gerhardt's butt cream   Topical Daily   insulin  aspart  0-20 Units Subcutaneous Q4H   insulin  aspart  5 Units Subcutaneous Q4H   insulin  glargine-yfgn  20 Units Subcutaneous BID   midodrine   10 mg Per Tube TID WC   multivitamin  1 tablet Oral QHS   nutrition supplement (JUVEN)  1 packet Per Tube BID BM   mouth rinse  15 mL Mouth Rinse Q2H   oxyCODONE   5 mg Per Tube Q8H   pantoprazole  (PROTONIX ) IV  40 mg Intravenous QHS   QUEtiapine   50 mg Per Tube QHS   rosuvastatin   40 mg Oral Daily   sodium chloride  flush  10-40 mL Intracatheter Q12H   [START ON 02/29/2024] thiamine   100 mg Per Tube Daily    Clevester Dally, MD 02/25/2024, 8:26 AM

## 2024-02-25 NOTE — Progress Notes (Signed)
   02/25/24 0805  Oxygen Therapy/Pulse Ox  O2 Device (S)  Tracheostomy Collar  O2 Therapy Oxygen humidified  O2 Flow Rate (L/min) 10 L/min  FiO2 (%) 40 %   Pt was taken off the ventilator and placed on trach collar. Pt is tolerating ok at this time. RN and RT at bedside to witness.  In addition, RT removed sutures from trach without complication (per protocol).

## 2024-02-25 NOTE — Progress Notes (Signed)
 PHARMACY ANTIBIOTIC CONSULT NOTE   Dustin Davenport a 68 y.o. male admitted on 4/15 s/p type A dissection and Bentall repair and PCI/DES to RCA. Patient had been on broad spectrum antibiotics with meropenem >zoysn wbc increased and changed back to meropenem  4/24 - stopped 4/30> restarted 5/3 vancomycin  stopped 5/1 restarted 5/3.  On day #7. WBC up to 13, Scr 0.94 (on CRRT). Tmax 100.6 on 5/9 AM.   Plan: Continue meropenem  1gm IV q8h Continue vancomycin  1gm q24 Monitor renal function, clinical status, de-escalation, C/S, levels as indicated   Allergies:  Allergies  Allergen Reactions   Bee Venom     Filed Weights   02/23/24 0415 02/24/24 0400 02/25/24 0556  Weight: 114.8 kg (253 lb 1.4 oz) 114.9 kg (253 lb 4.9 oz) 114.5 kg (252 lb 6.8 oz)       Latest Ref Rng & Units 02/25/2024    4:30 AM 02/24/2024    4:07 AM 02/23/2024    8:23 PM  CBC  WBC 4.0 - 10.5 K/uL 13.4  12.0  13.1   Hemoglobin 13.0 - 17.0 g/dL 8.5  8.2  8.1   Hematocrit 39.0 - 52.0 % 28.8  27.6  27.1   Platelets 150 - 400 K/uL 489  507  540     Antibiotics Given (last 72 hours)     Date/Time Action Medication Dose Rate   02/22/24 1519 New Bag/Given  [Patient was in MRI]   meropenem  (MERREM ) 1 g in sodium chloride  0.9 % 100 mL IVPB 1 g 200 mL/hr   02/22/24 1705 New Bag/Given   vancomycin  (VANCOCIN ) IVPB 1000 mg/200 mL premix 1,000 mg 200 mL/hr   02/22/24 2148 New Bag/Given   meropenem  (MERREM ) 1 g in sodium chloride  0.9 % 100 mL IVPB 1 g 200 mL/hr   02/23/24 3086 New Bag/Given   meropenem  (MERREM ) 1 g in sodium chloride  0.9 % 100 mL IVPB 1 g 200 mL/hr   02/23/24 1518 New Bag/Given   meropenem  (MERREM ) 1 g in sodium chloride  0.9 % 100 mL IVPB 1 g 200 mL/hr   02/23/24 1659 New Bag/Given   vancomycin  (VANCOCIN ) IVPB 1000 mg/200 mL premix 1,000 mg 200 mL/hr   02/23/24 2148 New Bag/Given   meropenem  (MERREM ) 1 g in sodium chloride  0.9 % 100 mL IVPB 1 g 200 mL/hr   02/24/24 0551 New Bag/Given   meropenem  (MERREM ) 1 g in  sodium chloride  0.9 % 100 mL IVPB 1 g 200 mL/hr   02/24/24 1336 New Bag/Given   meropenem  (MERREM ) 1 g in sodium chloride  0.9 % 100 mL IVPB 1 g 200 mL/hr   02/24/24 1612 New Bag/Given   vancomycin  (VANCOCIN ) IVPB 1000 mg/200 mL premix 1,000 mg 200 mL/hr   02/24/24 2112 New Bag/Given   meropenem  (MERREM ) 1 g in sodium chloride  0.9 % 100 mL IVPB 1 g 200 mL/hr   02/25/24 5784 New Bag/Given   meropenem  (MERREM ) 1 g in sodium chloride  0.9 % 100 mL IVPB 1 g 200 mL/hr       Antimicrobials this admission: Ancef  periop 4/15 - 4/17 Vanc 4/19 >4/25 restart 4/28 >4/30 restarted 5/3> Merrem  4/19 > 4/21 restarted 4/24>4/30, rstarted 5/3> Zosyn  4/21 >4/23  Micafungin  4/28> 5/4  Microbiology results: 4/15 MRSA: neg (surgical PCR +MSSA) 4/19 BAL: gm variable rod, rare yeast pseudohyphae 4/19 MRSA: neg SA + 4/24 TA candida albicans  5/2 BAL candida albicans  Thank you for allowing pharmacy to participate in this patient's care,  Nieves Bars, PharmD, BCCCP Clinical  Pharmacist  Phone: [phone] 02/25/2024 11:55 AM  Please check AMION for all [facility] phone numbers After 10:00 PM, call Main Pharmacy [phone]'

## 2024-02-25 NOTE — Progress Notes (Signed)
 Palliative Medicine Progress Note   [name]       Date: 02/25/2024 DOB: 31-Aug-1956  Age: 68 y.o. MRN#: [id number] Attending Physician: Joesph Mussel, DO Primary Care Physician: Annette Barters, MD Admit Date: 02/01/2024  Reason for Consultation/Follow-up: {Reason for Consult:23484}  HPI/Patient Profile: 68 y.o. male  with past medical history of hypertension who presented to the ED on 02/01/2024 with sudden onset chest pain.  EKG showed inferior STEMI and went to Cath Lab for RCA DES x 3.  Postprocedure had ongoing chest pain with subsequent TTE, TEE, and CTA showing extensive type a dissection with aortic valve failure.  Taken emergently to the OR for Bentall plus ascending/arch replacement. Hospitalization complicated by coagulopathy related to antiplatelet therapy from recent stent, AKI requiring CRRT, cardiogenic/septic shock, acute respiratory failure, bilateral pneumonia, and encephalopathy.   Palliative medicine was consulted for goals of care.  Subjective: Chart reviewed. Update received from RN. Patient assessed at bedside.   I met with wife at bedside in follow-up for palliative needs and emotional support.   Objective:  Physical Exam Constitutional:      General: He is not in acute distress.    Comments: Critically ill-appearing  Cardiovascular:     Rate and Rhythm: Normal rate.  Pulmonary:     Comments: Trach/vent Neurological:     Comments: Intermittently follows commands              Palliative Medicine Assessment & Plan   Assessment: Principal Problem:   STEMI involving right coronary artery (HCC) Active Problems:   S/P aortic dissection repair   AKI (acute kidney injury) (HCC)   On mechanically assisted ventilation (HCC)   Acute respiratory  failure with hypoxia (HCC)   Acute pulmonary edema (HCC)   Acute encephalopathy    Recommendations/Plan: Code status changed to DNR with interventions Continue full scope supportive interventions Allow time for outcomes, wife is hopeful for continued improvement of neurologic status Ongoing palliative support   I will follow-up when I return to service on Monday 5/12. Please call the team phone [phone] for urgent palliative needs over the weekend.   Primary Decision Maker: NEXT OF KIN  Goals of Care and Additional Recommendations: Limitations on Scope of Treatment: {Recommended Scope and Preferences:21019}  Code Status:   Prognosis:  {Palliative Care Prognosis:23504}  Discharge Planning: {Palliative dispostion:23505}  Care plan was discussed with ***  Thank you for allowing the Palliative Medicine Team to assist in the care of this patient.   ***   Wynetta Heckle, NP   Please contact Palliative Medicine Team phone at [phone] for questions and concerns.  For individual providers, please see AMION.

## 2024-02-25 NOTE — Progress Notes (Addendum)
 Advanced Heart Failure Rounding Note  Cardiologist: None  Chief Complaint: STEMI> Cardiogenic shock> Now s/p aortic dissection repair + Bentall Patient Profile   Dustin Davenport is a 68 y.o. male with HTN and HLD. Admitted with STEMI now with CGS. Found to have AAA and taken emergently to the OR. Now s/p Bentall/AVR and reimplantation of coronaries.  Significant events:    - Echo 4/15: AAA measuring 5-5.7 cm. Followed by emergent TEE 4/15 which was significant for torrential aortic regurgitation and dissection flap extending from the aortic root to the proximal ascending aorta  - CT C/A/P: Showed acute type A dissection with a 5.5 cm ascending aortic aneurysm extending into arch. Dissection goes down to infrarenal aorta and extends up into innominate and left common carotid artery - 4/16 Underwent aortic dissection repair (Bentall/AVR and reimplantation of coronaries. Intra-op course c/b coagulopathy due to Effient  (PCI of RCA earlier in the day)).  - Extubated 4/18 - Re-intubated 4/19. Bronch with diffuse mucopurulent secretions - 4/26 Afib with post conversion pause. Amiodarone  decreased.  - 4/28 Started back on  Vanc + Meropenum and Micafungin  added 4/28 per CCM. Resp Cx + Candida   - 4/28 LLE Venous Doppler +DVT (gastroc vein), LUE Venous Doppler + for superficial cephalic vein thrombosis  -5/1 CT head. No acute findings. Started on CRRT.  -No acute events overnight.  -5/3 CRRT stopped.  -5/5 Diuresed with IV lasix . Echo EF 40% RV normal RA moderately dilated.IVC dilated. - 5/6 MRI Brain - with acute infarct left parietal junction, subacute infarctions left occipital lobes. -5/7 CRRT restarted.   Subjective:    Co-ox 68%. On VP 0.04, off NE. Net negative 1.5L On CRRT In/out AF x3 between 2300-0000 overnight. On IV amio 30mg /hr Attempting trach collar this morning  EEG with mod diffuse encephalopathy, no seizures.  Withdrawals from stimuli. No commands today. Wife at bedside.  Coughing up thick white secretions.   Objective:    Weight Range: 114.5 kg Body mass index is 33.3 kg/m.   Vital Signs:   Temp:  [98.8 F (37.1 C)-100.6 F (38.1 C)] 99 F (37.2 C) (05/09 0400) Pulse Rate:  [37-111] 62 (05/09 0700) Resp:  [13-29] 23 (05/09 0700) BP: (73-153)/(30-112) 106/60 (05/09 0700) SpO2:  [89 %-100 %] 99 % (05/09 0700) FiO2 (%):  [40 %] 40 % (05/09 0400) Weight:  [114.5 kg] 114.5 kg (05/09 0556) Last BM Date : 02/25/24  Weight change: Filed Weights   02/23/24 0415 02/24/24 0400 02/25/24 0556  Weight: 114.8 kg 114.9 kg 114.5 kg   Intake/Output:  Intake/Output Summary (Last 24 hours) at 02/25/2024 0735 Last data filed at 02/25/2024 0700 Gross per 24 hour  Intake 3688.97 ml  Output 5277 ml  Net -1588.03 ml   Physical Exam   CVP 9-10 General: Acutely ill appearing. No distress on TC Cardiac: S1 and S2 present.  Extremities: Warm and dry.  No peripheral edema. + UNNA RLE Neuro: Responsive to stimuli, not following commands Lines/Devices:  trach,  Telemetry   SR in 60s, in AF x3 overnight between 2300-0000 (personally reviewed)  Labs    CBC Recent Labs    02/24/24 0407 02/25/24 0430  WBC 12.0* 13.4*  HGB 8.2* 8.5*  HCT 27.6* 28.8*  MCV 91.7 90.9  PLT 507* 489*   Basic Metabolic Panel Recent Labs    27/03/50 0407 02/24/24 1119 02/25/24 0430  NA 139 138 134*  K 3.8 4.3 4.2  CL 105 104 100  CO2 24 25 25  GLUCOSE 212* 114* 184*  BUN 64* 63* 42*  CREATININE 1.00 1.09 0.94  CALCIUM  8.2* 8.5* 8.5*  MG 2.5*  --  2.6*  PHOS 3.1 3.4 2.8   Liver Function Tests Recent Labs    02/24/24 1119 02/25/24 0430  ALBUMIN  2.5* 2.4*    Medications:    Scheduled Medications:  sodium chloride    Intravenous Once   acetaminophen   650 mg Per Tube Q6H   bethanechol   10 mg Per Tube TID   Chlorhexidine  Gluconate Cloth  6 each Topical Daily   clonazePAM   0.5 mg Per Tube BID   clopidogrel   75 mg Oral Daily   feeding supplement (PROSource TF20)   60 mL Per Tube TID   Gerhardt's butt cream   Topical Daily   insulin  aspart  0-20 Units Subcutaneous Q4H   insulin  aspart  5 Units Subcutaneous Q4H   insulin  glargine-yfgn  20 Units Subcutaneous BID   midodrine   10 mg Per Tube TID WC   multivitamin  1 tablet Oral QHS   nutrition supplement (JUVEN)  1 packet Per Tube BID BM   mouth rinse  15 mL Mouth Rinse Q2H   oxyCODONE   5 mg Per Tube Q8H   pantoprazole  (PROTONIX ) IV  40 mg Intravenous QHS   QUEtiapine   50 mg Per Tube QHS   rosuvastatin   40 mg Oral Daily   sodium chloride  flush  10-40 mL Intracatheter Q12H   [START ON 02/29/2024] thiamine   100 mg Per Tube Daily   Infusions:  amiodarone  30 mg/hr (02/25/24 0700)   dexmedetomidine  (PRECEDEX ) IV infusion     feeding supplement (VITAL 1.5 CAL) 65 mL/hr at 02/25/24 0700   heparin  2,000 Units/hr (02/25/24 0700)   meropenem  (MERREM ) IV Stopped (02/25/24 0538)   norepinephrine  (LEVOPHED ) Adult infusion Stopped (02/25/24 0302)   prismasol  BGK 4/2.5 400 mL/hr at 02/25/24 0059   prismasol  BGK 4/2.5 400 mL/hr at 02/25/24 0059   prismasol  BGK 4/2.5 1,500 mL/hr at 02/25/24 0650   thiamine  (VITAMIN B1) injection Stopped (02/24/24 1543)   vancomycin  Stopped (02/24/24 1713)   vasopressin  0.04 Units/min (02/25/24 0700)   PRN Medications: albuterol , heparin , heparin , HYDROmorphone  (DILAUDID ) injection, ondansetron  (ZOFRAN ) IV, mouth rinse, polyvinyl alcohol , sodium chloride  flush  Assessment/Plan   Septic/vasoplegic shock - Now vanc/meropenum ; remains on micafungin .  - Discussed with CCM - MRI - acute infarctions. See below  - On vancomycin  + meropenum.   Acute type A aortic dissection  - Echo 4/15: AAA measuring 5-5.7 cm - Emergent TEE 4/15: significant for torrential aortic regurgitation and dissection flap extending from the aortic root to the proximal ascending aorta  - CT C/A/P: Showed acute type A dissection with a 5.5 cm ascending aortic aneurysm extending into arch. Dissection goes  down to infrarenal aorta and extends up into innominate and left common carotid arteries  - Now s/p aortic dissection repair 02/01/24 (Bentall/AVR) and reimplantation of coronaries.  3. CAD, Acute inferior STEMI - Admitted with STEMI. Post PCI found to also have aortic dissection - LHC with single vessel CAD involving the mid to distal RCA, successful PCI of the RCA with overlapping DESx3 - No aspirin  with heparin  gtt - Continue plavix  75 mg + crestor  40 mg daily  4. Acute systolic HF > Cardiogenic shock - Post perfusion CGS.  - Echo 4/19: EF 25% RV moderate to severely decreased - Echo 5/5: EF 40% RV normal RA moderately dilated.IVC dilated.  - On VP 0.04. Off NE - Co-ox stable 68 - CVP  9-10. CVVHD UF 50/hr   5. Post-op hypoxic resp failure - extubated 4/18.  - re-intubated 4/19 - s/p tracheostomy - diffuse mucopurulent secretions on bronch 4/19. BAL GS: Mod WBC. Few gram variable rods. Rare yeast - CT C/A/P 4/27 w/ small postoperative seroma around aortic root/graft and possible PNA.  - Resp Cx 4/28 + Candida. Completed  micafungin  5/4 - On meropenum + vanc.   - trying trach collar today  6. PAF with post-termination pauses PVCs - Patient with AF intra-op.  - In SR. Non-sustained episodes of AFx3 overnight.  - Continue amio 30 mg per hour for now. Consider transitioning to PO once off pressors. - continue heparin  gtt   7. Hypokalemia/hypernatremia - Sodium/Potassium stable.   8. AKI due to ATN/shock - Baseline SCr ~1.3-1.4 - CRRT Stopped 5/3. Restarted CRRT 5/7  - Per Nephrology.   9. ABLA post-op - Received 6uPRBCs, 6 plt, 4 FFP and 5 cryo intra-op.  - 5/7 Got unit of blood.  - Hgb stable 8.5  10. Acute Venous Thrombosis  - + LLE DVT (gastroc vein) - + LUE superficial cephalic venin thrombosis - Continue  heparin  gtt  11. Acute CVA 12. Diffuse Encephalopathy - MRI- with acute infarct left parietal junction, subacute infarctions left occipital lobes. - EEG with  mod diffuse encephalopathy - Neuro following  CRITICAL CARE Performed by: [place] Lee   Total critical care time: 14 minutes  Critical care time was exclusive of separately billable procedures and treating other patients.  Critical care was necessary to treat or prevent imminent or life-threatening deterioration.  Critical care was time spent personally by me on the following activities: development of treatment plan with patient and/or surrogate as well as nursing, discussions with consultants, evaluation of patient's response to treatment, examination of patient, obtaining history from patient or surrogate, ordering and performing treatments and interventions, ordering and review of laboratory studies, ordering and review of radiographic studies, pulse oximetry and re-evaluation of patient's condition.  Length of Stay: 47  [place] Lee, NP  02/25/2024, 7:35 AM  Advanced Heart Failure Team Pager [phone] (M-F; 7a - 5p)  Please contact [facility] for night-coverage after hours (5p -7a ) and weekends on [email or web address]  He is now back on CVVH, just started pulling UF net negative 100 cc/hr. He is now off NE and on vasopressin  0.04 only.  CVP about 15.   He is in NSR on amiodarone  gtt and heparin  gtt.    He is on vancomycin /meropenem .    He will follow commands and seems more alert.    General: NAD Neck: JVD difficult, on trach collar.  Lungs: Rhonchi. Tracheostomy CV: Nondisplaced PMI.  Heart regular S1/S2, no S3/S4, no murmur.  1+ ankle edema.   Abdomen: Soft, nontender, no hepatosplenomegaly, no distention.  Skin: Intact without lesions or rashes.  Neurologic: Eyes are closed but follows some commands.  Extremities: 1+ ankle edema.  HEENT: Normal.    Good co-ox at 67.5%,  now on vasopressin  0.04 with CVVH. Last. echo with EF 40%, normal RV function, stable bioprosthetic aortic valve, dilated IVC.  Looks more volume overloaded on exam to me with CVP 15.  - Continue midodrine  10  tid.  - vasopressin  ongoing to allow UF via CVVH.  - Increased to 100 cc/hr net negative UF for more fluid removal.    Trach in place, on vancomycin /meropenem  for empiric PNA coverage.    NSR today, continue amiodarone  and heparin  gtts. Has had transient AF runs.    Continue  Plavix  and Crestor  with recent PCI.    MRI head with multiple small infarcts, was restarted on CVVH yesterday in setting of uremia and worsened mental status.  He does seem a bit better today with resolution of uremia, he will follow simple commands. Neurology following.   CRITICAL CARE Performed by: Peder Bourdon  Total critical care time: 40 minutes  Critical care time was exclusive of separately billable procedures and treating other patients.  Critical care was necessary to treat or prevent imminent or life-threatening deterioration.  Critical care was time spent personally by me on the following activities: development of treatment plan with patient and/or surrogate as well as nursing, discussions with consultants, evaluation of patient's response to treatment, examination of patient, obtaining history from patient or surrogate, ordering and performing treatments and interventions, ordering and review of laboratory studies, ordering and review of radiographic studies, pulse oximetry and re-evaluation of patient's condition.  Peder Bourdon 02/25/2024 11:23 AM

## 2024-02-25 NOTE — Progress Notes (Signed)
 Orthopedic Tech Progress Note Patient Details:  Dustin Davenport 06/18/1956 [id number]  Ortho Devices Type of Ortho Device: Ace wrap, Unna boot Ortho Device/Splint Location: RLE Ortho Device/Splint Interventions: Ordered, Application, Adjustment   Post Interventions Patient Tolerated: Well Instructions Provided: Care of device  [name] 02/25/2024, 6:36 PM

## 2024-02-25 NOTE — Progress Notes (Signed)
 PHARMACY - ANTICOAGULATION CONSULT NOTE  Pharmacy Consult for UFH IV Infusion Indication: atrial fibrillation / LLE DVT  Allergies  Allergen Reactions   Bee Venom     Patient Measurements: Height: 6\' 1"  (185.4 cm) Weight: 114.5 kg (252 lb 6.8 oz) IBW/kg (Calculated) : 79.9 HEPARIN  DW (KG): 106  Vital Signs: Temp: 99 F (37.2 C) (05/09 0400) Temp Source: Axillary (05/09 0400) BP: 106/60 (05/09 0700) Pulse Rate: 62 (05/09 0700)  Labs: Recent Labs    02/23/24 0358 02/23/24 1840 02/23/24 2023 02/24/24 0407 02/24/24 1119 02/25/24 0429 02/25/24 0430  HGB 7.5*  --  8.1* 8.2*  --   --  8.5*  HCT 26.1*  --  27.1* 27.6*  --   --  28.8*  PLT 569*  --  540* 507*  --   --  489*  APTT  --   --   --  130*  --   --  90*  HEPARINUNFRC 0.55  --   --  0.53  --  0.46  --   CREATININE 1.60*   < > 1.18 1.00 1.09  --  0.94   < > = values in this interval not displayed.    Estimated Creatinine Clearance: 99.7 mL/min (by C-G formula based on SCr of 0.94 mg/dL).   Medical History: Past Medical History:  Diagnosis Date   Hypertension     Medications:   Infusions:   amiodarone  30 mg/hr (02/25/24 0700)   dexmedetomidine  (PRECEDEX ) IV infusion     feeding supplement (VITAL 1.5 CAL) 65 mL/hr at 02/25/24 0700   heparin  2,000 Units/hr (02/25/24 0700)   meropenem  (MERREM ) IV Stopped (02/25/24 0538)   norepinephrine  (LEVOPHED ) Adult infusion Stopped (02/25/24 0302)   prismasol  BGK 4/2.5 400 mL/hr at 02/25/24 0059   prismasol  BGK 4/2.5 400 mL/hr at 02/25/24 0059   prismasol  BGK 4/2.5 1,500 mL/hr at 02/25/24 5784   thiamine  (VITAMIN B1) injection Stopped (02/24/24 1543)   vancomycin  Stopped (02/24/24 1713)   vasopressin  0.04 Units/min (02/25/24 0700)   PRN: albuterol , heparin , heparin , HYDROmorphone  (DILAUDID ) injection, ondansetron  (ZOFRAN ) IV, mouth rinse, polyvinyl alcohol , sodium chloride  flush    Assessment: Dustin Davenport is 68 y.o. male with PMH HTN and HLD. Admitted with STEMI  now with CGS. Found to have AAA requiring emergent surgical repair. Now s/p Bentall/AVR.  Pharmacy asked to start IV heparin  4/27 for afib.  LLE DVT and superficial thrombus in LUE found on doppler 4/28.  Heparin  level is slightly supratherapeutic at 0.46, on heparin  infusion at 2000 units/hr. Hgb stable 8 plt stable 500. Slight oozing at IV  resolved. CRRT running with saline will continue systemic heparin  drip.  Goal of Therapy:  Heparin  level 0.3-0.5 units/ml Monitor platelets by anticoagulation protocol: Yes   Plan:  Continue heparin  infusion at 2000 units/hr Daily heparin  level and CBC Monitor s/s bleeding.  [name] Pharm.D. CPP, BCPS Clinical Pharmacist [phone] 02/25/2024 7:29 AM    Please check AMION for all [facility] Pharm[facility] phone numbers After 10:00 PM, call Main Pharmacy [phone]

## 2024-02-25 NOTE — Progress Notes (Addendum)
 Dustin Davenport, MRN:  [id number], DOB:  March 26, 1956, LOS: 24 ADMISSION DATE:  02/01/2024, CONSULTATION DATE:  02/03/24 REFERRING MD:  Sherene Dilling, CHIEF COMPLAINT:  chest pain   History of Present Illness:  68 year old man w/ hx of HTN, obesity who presented on 4/15 with sudden onset chest pain.  EKG showing inferior STEMI and went to cath lab for RCA DES x 3.  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  Taken emergently to OR for Bentall+ ascending/arch replacement.  Procedure complicated by coagulopathy related to antiplatelet therapy from recent stent, multiple liters of blood products given.  Returned to ICU 4/16 on vent and PCCM consulted to assist with vent wean on 4/17. Since that time, patient has failed spontaneous breathing trials and remains intubated with trach.  He has persistently required CRRT since 5/7   Pertinent  Medical History   Past Medical History:  Diagnosis Date   Hypertension     Significant Hospital Events: Including procedures, antibiotic start and stop dates in addition to other pertinent events   4/16 RCA stent then Bentall, aortic arch replacement 4/18 extubated 4/19 reintubated in afternoon for aspiration, bronch, Delirious, low grade fevers, expressive aphasia 4/20 but mental status precludes extubation 4/21 started spiking fever again, meropenem  switched back from Zosyn , on vancomycin  as well 02/07/22 failed spontaneous breathing trial 4/24 Tachypnea, fever. Resp culture sent  4/25 underwent right heart cath showing normal filling pressure 4/26 remained in A-fib, became afebrile, Precedex  was switched to low-dose propofol  5/2 trach 5/3 ongoing fevers, leukocytosis; vanc/meropenem  started; CRRT held 5/4 HD catheter removed for ongoing leukocytosis 5/7 HD catheter placed and restarted on CRRT  Interim History / Subjective:  -1.5 L, net negative 6.1 L.  CRRT with 100  ml/hr being pulled this morning.  Levo weaned off overnight and remains on vasopressin   Amiodarone  at 30 mg/hr  Objective   Blood pressure (!) 118/55, pulse 65, temperature 97.6 F (36.4 C), temperature source Axillary, resp. rate (!) 21, height 6\' 1"  (1.854 m), weight 114.5 kg, SpO2 98%. CVP:  [7 mmHg-14 mmHg] 8 mmHg  Vent Mode: PRVC FiO2 (%):  [40 %-50 %] 50 % Set Rate:  [10 bmp] 10 bmp Vt Set:  [570 mL] 570 mL PEEP:  [8 cmH20] 8 cmH20 Plateau Pressure:  [20 cmH20-25 cmH20] 20 cmH20   Intake/Output Summary (Last 24 hours) at 02/25/2024 1117 Last data filed at 02/25/2024 1100 Gross per 24 hour  Intake 3640.93 ml  Output 5488.7 ml  Net -1847.77 ml   Filed Weights   02/23/24 0415 02/24/24 0400 02/25/24 0556  Weight: 114.8 kg 114.9 kg 114.5 kg    Examination: Adult male resting on mechanical vent support Opens eyes, follows commands in extremities R > L.   Abdomen soft and non tender Ext 1-2 + edema  Resolved Hospital Problem list   Constipation Shock combined cardiogenic/septic Hypokalemia/hypophosphatemia Shock liver, improving Perioperative coagulopathy  Ileus, resolved- off reglan  Constipation, resolved  Assessment & Plan:  Acute inferior wall STEMI status post PCI and DES Type A aortic dissection with s/p emergent aortic reconstruction and aortic root replacement -Plavix ,statin, heparin  infusion  Paroxysmal A-fib with RVR;  - on amiodarone , previously did have issues with bradycardia on amio but currently well tolerated. - Heparin  infusion   Acute respiratory failure with hypoxia and hypercapnia- now trach dependent (5/2) -starting to get TC trials, failed this am  >> frothy secretions  and tachypnea. Placed back on support and increased CRRT rate  Sepsis Bilateral multifocal pneumonia- copious ongoing presumably noninfectious? Secretions - 4/28 respiratory cx with candida and completed course of micafungin  on 5/4 - Vanc, Mero - plan is for empiric 7 days that  ends today - Levo weaned off overnight, remains on vasopressin  and midodrine  10 mg TID  Acute kidney injury due to ischemic ATN from shock, stable  - CRRT held 5/3; restarted 5/7 - Ongoing evidence of volume overload/pulmonary edema. Continue volume removal.   Stroke Mixed anoxic, uremic encephalopathy plus ICU delirium- 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 prognosticating is challenging at this point but he has plateau'd with mental status improvement - Continue scheduled oxy, seroquel , and clonazepam .  Previously attempting to wean for better neurological exam. Concern is volume is contributing to agitation with pulmonary edema, keep meds as is until improved volume status and then start to wean. He is interactive on exam this morning.   Perioperative acute blood loss anemia- stable; now just related to bone marrow stunning and phlebotomy  Leukocytosis- overall improved  Uncontrolled hyperglycemia; A1c 5.3 - Controlled with SSI + TF coverage  Stage 1 sacral ulcer  LLE DVT- on heparin  gtt    Best Practice (right click and "Reselect all SmartList Selections" daily)   Diet/type: Tube feeds DVT prophylaxis heparin  gtt Pressure ulcer(s): yes, see nursing documentation GI prophylaxis: PPI Lines: Central line picc Foley:  Yes, and it is still needed Code Status:  DNR Last date of multidisciplinary goals of care discussion: 5/9 updated at bedside  35 min cc time  Kirt Pereyra, MD 02/25/24 11:17 AM [facility]  For contact information, see Amion. If no response to pager, please call PCCM consult pager. After hours, 7PM- 7AM, please call Elink.

## 2024-02-25 NOTE — Progress Notes (Signed)
 Subjective: No acute events overnight.  Wife at bedside.  Tmax 100.6 Fahrenheit  ROS: Unable to obtain due to poor mental status  Examination  Vital signs in last 24 hours: Temp:  [97.6 F (36.4 C)-100.6 F (38.1 C)] 97.8 F (36.6 C) (05/09 1130) Pulse Rate:  [37-102] 63 (05/09 1500) Resp:  [13-35] 31 (05/09 1500) BP: (73-153)/(30-112) 116/60 (05/09 1445) SpO2:  [90 %-100 %] 94 % (05/09 1500) FiO2 (%):  [40 %-50 %] 50 % (05/09 1020) Weight:  [114.5 kg] 114.5 kg (05/09 0556)  General: lying in bed, NAD Neuro: Asleep, at times opens his eyes and noted to be intermittently moving bilateral upper extremities with antigravity strength and spontaneously moving bilateral lower extremities.  Do not follow commands for me.  However per wife he just received Dilaudid  and did follow commands for the morning team per chart review  Basic Metabolic Panel: Recent Labs  Lab 02/19/24 0420 02/19/24 1704 02/20/24 0414 02/20/24 1545 02/21/24 0441 02/21/24 1543 02/23/24 1840 02/23/24 2023 02/24/24 0407 02/24/24 1119 02/25/24 0430  NA 133*   < > 136   < > 139   < > 119* 143 139 138 134*  K 3.7   < > 3.4*   < > 3.6   < > 2.9* 3.7 3.8 4.3 4.2  CL 101   < > 105   < > 108   < > 88* 108 105 104 100  CO2 23   < > 23   < > 22   < > 20* 26 24 25 25   GLUCOSE 314*   < > 257*   < > 179*   < > 910* 130* 212* 114* 184*  BUN 51*   < > 69*   < > 97*   < > 78* 96* 64* 63* 42*  CREATININE 1.16   < > 1.33*   < > 1.64*   < > 1.20 1.18 1.00 1.09 0.94  CALCIUM  7.5*   < > 7.7*   < > 8.2*   < > 7.0* 8.7* 8.2* 8.5* 8.5*  MG 2.7*  --  2.7*  --  2.8*  --   --   --  2.5*  --  2.6*  PHOS 3.3   < > 3.8   < > 6.0*   < > 3.6 4.2 3.1 3.4 2.8   < > = values in this interval not displayed.    CBC: Recent Labs  Lab 02/22/24 0801 02/23/24 0358 02/23/24 2023 02/24/24 0407 02/25/24 0430  WBC 19.2* 17.0* 13.1* 12.0* 13.4*  HGB 7.2* 7.5* 8.1* 8.2* 8.5*  HCT 24.6* 26.1* 27.1* 27.6* 28.8*  MCV 90.8 92.6 89.7 91.7 90.9   PLT 578* 569* 540* 507* 489*     Coagulation Studies: No results for input(s): "LABPROT", "INR" in the last 72 hours.  Imaging No new brain imaging overnight  ASSESSMENT AND PLAN:6 68 y.o. male with hx of hypertension, obesity who presented on 4/15 with sudden onset chest pain, consistent with STEMI status post Cath Lab for RCA DES x 3. Returned to ICU 4/16 on vent and PCCM consulted to assist with vent wean on 4/17. Neurology was consulted in the setting of altered mental status and strokes found on MRI.    Acute encephalopathy Acute ischemic stroke -DDX include delirium, neurological deterioration in elderly patient due to prolonged hospitalization, toxic-metabolic - Thiamine  500 mg daily IV for 5 days, and then continue with Thiamine  100 mg daily afterwards  -Okay to switch to p.o.  anticoagulation from neurology standpoint - At this point, it is difficult to prognosticate definitively how much of patient's mental status is secondary to delirium, medications and infection.  However he already has a tracheostomy and the strokes do not appear to be significantly limiting/contributing to the presentation.  Therefore I would agree with ICU team as well as wife that we can give patient about 1 to 2 months to see if he recovers.  However if by then there is no significant improvement, then I would be concerned that he may not have significant neurological improvement  - Delirium Precautions - Neurology will sign off.  Please call us  for any further questions  I have spent a total of  36  minutes with the patient reviewing hospital notes,  test results, labs and examining the patient as well as establishing an assessment and plan that was discussed personally with the patient's wife at bedside.  > 50% of time was spent in direct patient care.         Roxy Cordial Epilepsy [facility] For questions after 5pm please refer to AMION to reach the Neurologist on call

## 2024-02-26 DIAGNOSIS — I480 Paroxysmal atrial fibrillation: Secondary | ICD-10-CM | POA: Diagnosis not present

## 2024-02-26 DIAGNOSIS — E876 Hypokalemia: Secondary | ICD-10-CM | POA: Diagnosis not present

## 2024-02-26 DIAGNOSIS — I5020 Unspecified systolic (congestive) heart failure: Secondary | ICD-10-CM | POA: Diagnosis not present

## 2024-02-26 DIAGNOSIS — I2111 ST elevation (STEMI) myocardial infarction involving right coronary artery: Secondary | ICD-10-CM | POA: Diagnosis not present

## 2024-02-26 LAB — GLUCOSE, CAPILLARY
Glucose-Capillary: 134 mg/dL — ABNORMAL HIGH (ref 70–99)
Glucose-Capillary: 144 mg/dL — ABNORMAL HIGH (ref 70–99)
Glucose-Capillary: 128 mg/dL — ABNORMAL HIGH (ref 70–99)
Glucose-Capillary: 161 mg/dL — ABNORMAL HIGH (ref 70–99)
Glucose-Capillary: 218 mg/dL — ABNORMAL HIGH (ref 70–99)
Glucose-Capillary: 139 mg/dL — ABNORMAL HIGH (ref 70–99)

## 2024-02-26 LAB — RENAL FUNCTION PANEL
Albumin: 2.6 g/dL — ABNORMAL LOW (ref 3.5–5.0)
Albumin: 2.7 g/dL — ABNORMAL LOW (ref 3.5–5.0)
Anion gap: 12 (ref 5–15)
Anion gap: 10 (ref 5–15)
BUN: 43 mg/dL — ABNORMAL HIGH (ref 8–23)
BUN: 50 mg/dL — ABNORMAL HIGH (ref 8–23)
CO2: 23 mmol/L (ref 22–32)
CO2: 24 mmol/L (ref 22–32)
Calcium: 8.8 mg/dL — ABNORMAL LOW (ref 8.9–10.3)
Calcium: 8.7 mg/dL — ABNORMAL LOW (ref 8.9–10.3)
Chloride: 98 mmol/L (ref 98–111)
Chloride: 96 mmol/L — ABNORMAL LOW (ref 98–111)
Creatinine, Ser: 0.93 mg/dL (ref 0.61–1.24)
Creatinine, Ser: 1.19 mg/dL (ref 0.61–1.24)
GFR, Estimated: >60 mL/min (ref >60)
GFR, Estimated: >60 mL/min (ref >60)
Glucose, Bld: 199 mg/dL — ABNORMAL HIGH (ref 70–99)
Glucose, Bld: 347 mg/dL — ABNORMAL HIGH (ref 70–99)
Phosphorus: 3 mg/dL (ref 2.5–4.6)
Phosphorus: 2.6 mg/dL (ref 2.5–4.6)
Potassium: 4 mmol/L (ref 3.5–5.1)
Potassium: 5 mmol/L (ref 3.5–5.1)
Sodium: 133 mmol/L — ABNORMAL LOW (ref 135–145)
Sodium: 130 mmol/L — ABNORMAL LOW (ref 135–145)

## 2024-02-26 LAB — CBC
HCT: 30.7 % — ABNORMAL LOW (ref 39.0–52.0)
Hemoglobin: 8.9 g/dL — ABNORMAL LOW (ref 13.0–17.0)
MCH: 26.6 pg (ref 26.0–34.0)
MCHC: 29 g/dL — ABNORMAL LOW (ref 30.0–36.0)
MCV: 91.9 fL (ref 80.0–100.0)
Platelets: 478 10*3/uL — ABNORMAL HIGH (ref 150–400)
RBC: 3.34 MIL/uL — ABNORMAL LOW (ref 4.22–5.81)
RDW: 18.5 % — ABNORMAL HIGH (ref 11.5–15.5)
WBC: 13.2 10*3/uL — ABNORMAL HIGH (ref 4.0–10.5)
nRBC: 3.4 % — ABNORMAL HIGH (ref 0.0–0.2)

## 2024-02-26 LAB — APTT: aPTT: 101 s — ABNORMAL HIGH (ref 24–36)

## 2024-02-26 LAB — VITAMIN B1: Vitamin B1 (Thiamine): 224.9 nmol/L — ABNORMAL HIGH (ref 66.5–200.0)

## 2024-02-26 LAB — MAGNESIUM: Magnesium: 2.7 mg/dL — ABNORMAL HIGH (ref 1.7–2.4)

## 2024-02-26 LAB — COOXEMETRY PANEL
Carboxyhemoglobin: 1.1 % (ref 0.5–1.5)
Methemoglobin: 1.3 % (ref 0.0–1.5)
O2 Saturation: 67.9 %
Total hemoglobin: 9.5 g/dL — ABNORMAL LOW (ref 12.0–16.0)

## 2024-02-26 LAB — HEPARIN LEVEL (UNFRACTIONATED): Heparin Unfractionated: 0.51 [IU]/mL (ref 0.30–0.70)

## 2024-02-26 MED ORDER — RENA-VITE PO TABS
1.0000 | ORAL_TABLET | Freq: Every day | ORAL | Status: DC
  Administered 2024-02-26: 1
  Filled 2024-02-26: qty 1

## 2024-02-26 MED ORDER — ROSUVASTATIN CALCIUM 20 MG PO TABS
40.0000 mg | ORAL_TABLET | Freq: Every day | ORAL | Status: DC
  Administered 2024-02-26 – 2024-02-27 (×2): 40 mg
  Filled 2024-02-26 (×2): qty 2

## 2024-02-26 MED ORDER — CLONAZEPAM 0.5 MG PO TABS: 0.5000 mg | ORAL_TABLET | Freq: Every day | ORAL | Status: DC

## 2024-02-26 MED ORDER — CLOPIDOGREL BISULFATE 75 MG PO TABS
75.0000 mg | ORAL_TABLET | Freq: Every day | ORAL | Status: DC
  Administered 2024-02-26 – 2024-02-27 (×2): 75 mg
  Filled 2024-02-26 (×2): qty 1

## 2024-02-26 NOTE — Progress Notes (Signed)
 Patient ID: Dustin Davenport, male   DOB: Jan 07, 1956, 68 y.o.   MRN: [id number] [facility] Progress Note   Assessment/ Plan:   1. Acute kidney Injury: Secondary to ischemic ATN in the setting of STEMI/type A aortic dissection.  Restarted back on CRRT because of worsening azotemia and its contribution on his decreased mentation/responsiveness.  Based on assessment by neurology and their prognosis, my plan is to continue him on CRRT until late Sunday or Monday morning and then attempt to transition him to intermittent dialysis next week.  He will need tunneled dialysis catheter at some point with prolonged dialysis dependency anticipated based on significant drop off of urine output. 2.  Coronary artery disease: Status post inferior wall STEMI status post PCI to RCA complicated by acute systolic heart failure.  Hemodynamically stable on fixed dose vasopressin /CRRT. 3.  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 4.  Anemia: Secondary to aortic dissection/surgical losses and acute/critical illness.  Continue to monitor for PRBC indications. 5.  Hyponatremia: Medications reviewed-not getting any hypotonic fluids or free water  flushes.  Will continue to monitor on CRRT with UF.  Subjective:   No acute events noted overnight, neurology note from yesterday afternoon reviewed with regards to prognosis from their standpoint.   Objective:   BP (!) 111/51   Pulse 62   Temp 98.2 F (36.8 C) (Axillary)   Resp (!) 33   Ht 6\' 1"  (1.854 m)   Wt 110.1 kg   SpO2 94%   BMI 32.02 kg/m   Intake/Output Summary (Last 24 hours) at 02/26/2024 0807 Last data filed at 02/26/2024 0800 Gross per 24 hour  Intake 4016.71 ml  Output 6895 ml  Net -2878.29 ml   Weight change: -4.4 kg  Physical Exam: Gen: Resting comfortably on tracheal collar.  Wife at bedside CVS: Pulse regular rhythm, normal rate, S1 and S2 distant Resp: Coarse  breath sounds without discrete rales or rhonchi Abd: Soft, obese, nontender, bowel sounds normal Ext: 2+ upper extremity edema with 1+ lower extremity edema  Imaging: DG CHEST PORT 1 VIEW Result Date: 02/25/2024 CLINICAL DATA:  200808 Hypoxia [id number] EXAM: PORTABLE CHEST - 1 VIEW COMPARISON:  Multiple, most recently Feb 23, 2024 FINDINGS: Tracheostomy tube is well positioned terminating in the trachea at the thoracic inlet. Weighted feeding tube courses below the diaphragm with the distal tip not included in the field of view. Subsegmental atelectasis in the right mid lung. Hazy airspace opacities in the medial right lung base. Minimal retrocardiac airspace opacities. No pneumothorax or pleural effusion. No cardiomegaly. Aortic valve replacement. Right PICC terminates at the cavoatrial junction. Left IJ approach central venous catheter terminates in the downstream left brachiocephalic vein. Sternotomy wires. IMPRESSION: 1. Hazy airspace opacities in both lung bases, which may represent atelectasis or changes of aspiration. 2. Similar positioning of the support tubes and lines, as described above. Electronically Signed   By: Rance Burrows M.D.   On: 02/25/2024 16:09   EEG adult Result Date: 02/24/2024 Arleene Lack, MD     02/24/2024  4:34 PM [name] MRN: [id number] Epilepsy Attending: Arleene Lack Referring Physician/Provider: Brayton Calin, MD Date: 02/24/2024 Duration: 23.14 mins Patient history: 68yo M with ams. EEG to evaluate for seizure Level of alertness: Awake/ lethargic AEDs during EEG study: Clonazepam  Technical aspects: This EEG study was done with scalp electrodes positioned according to the 10-20 International system of electrode placement. Electrical activity was  reviewed with band pass filter of 1-70Hz , sensitivity of 7 uV/mm, display speed of 83mm/sec with a 60Hz  notched filter applied as appropriate. EEG data were recorded continuously and digitally stored.  Video  monitoring was available and reviewed as appropriate. Description: EEG showed continuous generalized predominantly   5 to 7 Hz theta slowing admixed with intermittent 2-3hz  delta slowing. Hyperventilation and photic stimulation were not performed.    ABNORMALITY - Continuous slow, generalized  IMPRESSION: This study is suggestive of moderate diffuse encephalopathy. No seizures or epileptiform discharges were seen throughout the recording.  Arleene Lack    Labs: BMET Recent Labs  Lab 02/23/24 1840 02/23/24 2023 02/24/24 0407 02/24/24 1119 02/25/24 0430 02/25/24 1619 02/26/24 0427  NA 119* 143 139 138 134* 133* 133*  K 2.9* 3.7 3.8 4.3 4.2 4.6 4.0  CL 88* 108 105 104 100 100 98  CO2 20* 26 24 25 25 26 23   GLUCOSE 910* 130* 212* 114* 184* 221* 199*  BUN 78* 96* 64* 63* 42* 45* 43*  CREATININE 1.20 1.18 1.00 1.09 0.94 0.93 0.93  CALCIUM  7.0* 8.7* 8.2* 8.5* 8.5* 8.6* 8.8*  PHOS 3.6 4.2 3.1 3.4 2.8 2.5 3.0   CBC Recent Labs  Lab 02/23/24 2023 02/24/24 0407 02/25/24 0430 02/26/24 0427  WBC 13.1* 12.0* 13.4* 13.2*  HGB 8.1* 8.2* 8.5* 8.9*  HCT 27.1* 27.6* 28.8* 30.7*  MCV 89.7 91.7 90.9 91.9  PLT 540* 507* 489* 478*    Medications:     sodium chloride    Intravenous Once   acetaminophen   650 mg Per Tube Q6H   bethanechol   10 mg Per Tube TID   Chlorhexidine  Gluconate Cloth  6 each Topical Daily   clonazePAM   0.5 mg Per Tube BID   clopidogrel   75 mg Oral Daily   feeding supplement (PROSource TF20)  60 mL Per Tube TID   Gerhardt's butt cream   Topical Daily   insulin  aspart  0-20 Units Subcutaneous Q4H   insulin  aspart  5 Units Subcutaneous Q4H   insulin  glargine-yfgn  20 Units Subcutaneous BID   midodrine   10 mg Per Tube TID WC   multivitamin  1 tablet Oral QHS   nutrition supplement (JUVEN)  1 packet Per Tube BID BM   mouth rinse  15 mL Mouth Rinse Q2H   oxyCODONE   5 mg Per Tube Q8H   pantoprazole  (PROTONIX ) IV  40 mg Intravenous QHS   QUEtiapine   100 mg Per Tube  QHS   rosuvastatin   40 mg Oral Daily   sodium chloride  flush  10-40 mL Intracatheter Q12H   [START ON 02/29/2024] thiamine   100 mg Per Tube Daily    Clevester Dally, MD 02/26/2024, 8:07 AM

## 2024-02-26 NOTE — Progress Notes (Signed)
 PHARMACY - ANTICOAGULATION CONSULT NOTE  Pharmacy Consult for UFH IV Infusion Indication: atrial fibrillation / LLE DVT  Allergies  Allergen Reactions   Bee Venom     Patient Measurements: Height: 6\' 1"  (185.4 cm) Weight: 110.1 kg (242 lb 11.6 oz) IBW/kg (Calculated) : 79.9 HEPARIN  DW (KG): 106  Vital Signs: Temp: 98.2 F (36.8 C) (05/10 0814) Temp Source: Oral (05/10 0814) BP: 118/66 (05/10 0900) Pulse Rate: 65 (05/10 0900)  Labs: Recent Labs    02/24/24 0407 02/24/24 1119 02/25/24 0429 02/25/24 0430 02/25/24 1619 02/26/24 0427  HGB 8.2*  --   --  8.5*  --  8.9*  HCT 27.6*  --   --  28.8*  --  30.7*  PLT 507*  --   --  489*  --  478*  APTT 130*  --   --  90*  --  101*  HEPARINUNFRC 0.53  --  0.46  --   --  0.51  CREATININE 1.00   < >  --  0.94 0.93 0.93   < > = values in this interval not displayed.    Estimated Creatinine Clearance: 98.9 mL/min (by C-G formula based on SCr of 0.93 mg/dL).   Medical History: Past Medical History:  Diagnosis Date   Hypertension     Medications:   Infusions:   amiodarone  30 mg/hr (02/26/24 0900)   dexmedetomidine  (PRECEDEX ) IV infusion     feeding supplement (VITAL 1.5 CAL) 65 mL/hr at 02/26/24 0900   heparin  2,000 Units/hr (02/26/24 0900)   meropenem  (MERREM ) IV Stopped (02/26/24 8657)   norepinephrine  (LEVOPHED ) Adult infusion Stopped (02/25/24 0302)   prismasol  BGK 4/2.5 400 mL/hr at 02/26/24 0227   prismasol  BGK 4/2.5 400 mL/hr at 02/26/24 0227   prismasol  BGK 4/2.5 1,500 mL/hr at 02/26/24 0641   thiamine  (VITAMIN B1) injection Stopped (02/25/24 0940)   vancomycin  Stopped (02/25/24 1706)   vasopressin  0.04 Units/min (02/26/24 0900)   PRN: albuterol , heparin , heparin , HYDROmorphone  (DILAUDID ) injection, ondansetron  (ZOFRAN ) IV, mouth rinse, polyvinyl alcohol , sodium chloride  flush    Assessment: 68 y.o. male with PMH HTN and HLD. Admitted with STEMI now with CGS. Found to have AAA requiring emergent surgical  repair. Now s/p Bentall/AVR.  Pharmacy asked to start IV heparin  4/27 for afib. LLE DVT and superficial thrombus in LUE found on doppler 4/28.  Heparin  level slightly above goal at 0.51, no bleeding issues so will keep current rate, CBC ok.  Goal of Therapy:  Heparin  level 0.3-0.5 units/ml Monitor platelets by anticoagulation protocol: Yes   Plan:  Continue heparin  infusion at 2000 units/hr Daily heparin  level and CBC Monitor s/s bleeding.   Levin Reamer, PharmD, BCPS, [facility] Clinical Pharmacist [phone] Please check AMION for all [facility] Pharmacy numbers 02/26/2024

## 2024-02-26 NOTE — Progress Notes (Signed)
 Patient ID: Dustin Davenport, male   DOB: 05/30/1956, 68 y.o.   MRN: [id number]     Advanced Heart Failure Rounding Note  Cardiologist: None  Chief Complaint: STEMI> Cardiogenic shock> Now s/p aortic dissection repair + Bentall Patient Profile   Dustin Davenport is a 68 y.o. male with HTN and HLD. Admitted with STEMI now with CGS. Found to have AAA and taken emergently to the OR. Now s/p Bentall/AVR and reimplantation of coronaries.  Significant events:    - Echo 4/15: AAA measuring 5-5.7 cm. Followed by emergent TEE 4/15 which was significant for torrential aortic regurgitation and dissection flap extending from the aortic root to the proximal ascending aorta  - CT C/A/P: Showed acute type A dissection with a 5.5 cm ascending aortic aneurysm extending into arch. Dissection goes down to infrarenal aorta and extends up into innominate and left common carotid artery - 4/16 Underwent aortic dissection repair (Bentall/AVR and reimplantation of coronaries. Intra-op course c/b coagulopathy due to Effient  (PCI of RCA earlier in the day)).  - Extubated 4/18 - Re-intubated 4/19. Bronch with diffuse mucopurulent secretions - 4/26 Afib with post conversion pause. Amiodarone  decreased.  - 4/28 Started back on  Vanc + Meropenem  and Micafungin  added 4/28 per CCM. Resp Cx + Candida   - 4/28 LLE Venous Doppler +DVT (gastroc vein), LUE Venous Doppler + for superficial cephalic vein thrombosis  -5/1 CT head. No acute findings. Started on CRRT.  -No acute events overnight.  -5/3 CRRT stopped.  -5/5 Diuresed with IV lasix . Echo EF 40% RV normal RA moderately dilated.IVC dilated. - 5/6 MRI Brain - with acute infarct left parietal junction, subacute infarctions left occipital lobes. -5/7 CRRT restarted.   Subjective:    Co-ox 68%. On vasopressin  0.04 to allow UF via CVVH.  Pulling net negative UF 100 cc/hr.  Weight down, CVP 10 today.  In NSR on amiodarone  gtt and heparin  gtt.   Failed trach collar trial  yesterday.   EEG with mod diffuse encephalopathy, no seizures. He will occasionally follow commands per nursing, but not currently for me.   He remains on coverage for PNA with vancomycin /meropenem .   Objective:    Weight Range: 110.1 kg Body mass index is 32.02 kg/m.   Vital Signs:   Temp:  [97.8 F (36.6 C)-100.3 F (37.9 C)] 98.2 F (36.8 C) (05/10 0814) Pulse Rate:  [54-67] 65 (05/10 0900) Resp:  [12-35] 33 (05/10 0900) BP: (79-149)/(48-102) 118/66 (05/10 0900) SpO2:  [93 %-100 %] 95 % (05/10 0900) FiO2 (%):  [40 %-50 %] 40 % (05/10 0805) Weight:  [110.1 kg] 110.1 kg (05/10 0500) Last BM Date : 02/26/24  Weight change: Filed Weights   02/24/24 0400 02/25/24 0556 02/26/24 0500  Weight: 114.9 kg 114.5 kg 110.1 kg   Intake/Output:  Intake/Output Summary (Last 24 hours) at 02/26/2024 0924 Last data filed at 02/26/2024 0900 Gross per 24 hour  Intake 3896.5 ml  Output 7216 ml  Net -3319.5 ml   Physical Exam   CVP 10 General: NAD Neck: Tracheostomy, JVP difficult to assess, no thyromegaly or thyroid nodule.  Lungs: Clear to auscultation bilaterally with normal respiratory effort. CV: Nondisplaced PMI.  Heart regular S1/S2, no S3/S4, no murmur.   Abdomen: Soft, nontender, no hepatosplenomegaly, no distention.  Skin: Intact without lesions or rashes.  Neurologic: He will not follow commands for me, withdraws from painful stimuli Extremities: No clubbing or cyanosis.  HEENT: Normal.   Telemetry   NSR 70s (personally reviewed)  Labs  CBC Recent Labs    02/25/24 0430 02/26/24 0427  WBC 13.4* 13.2*  HGB 8.5* 8.9*  HCT 28.8* 30.7*  MCV 90.9 91.9  PLT 489* 478*   Basic Metabolic Panel Recent Labs    44/03/47 0430 02/25/24 1619 02/26/24 0427  NA 134* 133* 133*  K 4.2 4.6 4.0  CL 100 100 98  CO2 25 26 23   GLUCOSE 184* 221* 199*  BUN 42* 45* 43*  CREATININE 0.94 0.93 0.93  CALCIUM  8.5* 8.6* 8.8*  MG 2.6*  --  2.7*  PHOS 2.8 2.5 3.0   Liver  Function Tests Recent Labs    02/25/24 1619 02/26/24 0427  ALBUMIN  2.5* 2.6*    Medications:    Scheduled Medications:  sodium chloride    Intravenous Once   acetaminophen   650 mg Per Tube Q6H   bethanechol   10 mg Per Tube TID   Chlorhexidine  Gluconate Cloth  6 each Topical Daily   clonazePAM   0.5 mg Per Tube BID   clopidogrel   75 mg Oral Daily   feeding supplement (PROSource TF20)  60 mL Per Tube TID   Gerhardt's butt cream   Topical Daily   insulin  aspart  0-20 Units Subcutaneous Q4H   insulin  aspart  5 Units Subcutaneous Q4H   insulin  glargine-yfgn  20 Units Subcutaneous BID   midodrine   10 mg Per Tube TID WC   multivitamin  1 tablet Oral QHS   nutrition supplement (JUVEN)  1 packet Per Tube BID BM   mouth rinse  15 mL Mouth Rinse Q2H   oxyCODONE   5 mg Per Tube Q8H   pantoprazole  (PROTONIX ) IV  40 mg Intravenous QHS   QUEtiapine   100 mg Per Tube QHS   rosuvastatin   40 mg Oral Daily   sodium chloride  flush  10-40 mL Intracatheter Q12H   [START ON 02/29/2024] thiamine   100 mg Per Tube Daily   Infusions:  amiodarone  30 mg/hr (02/26/24 0900)   dexmedetomidine  (PRECEDEX ) IV infusion     feeding supplement (VITAL 1.5 CAL) 65 mL/hr at 02/26/24 0900   heparin  2,000 Units/hr (02/26/24 0900)   meropenem  (MERREM ) IV Stopped (02/26/24 4259)   norepinephrine  (LEVOPHED ) Adult infusion Stopped (02/25/24 0302)   prismasol  BGK 4/2.5 400 mL/hr at 02/26/24 0227   prismasol  BGK 4/2.5 400 mL/hr at 02/26/24 0227   prismasol  BGK 4/2.5 1,500 mL/hr at 02/26/24 0641   thiamine  (VITAMIN B1) injection Stopped (02/25/24 0940)   vancomycin  Stopped (02/25/24 1706)   vasopressin  0.04 Units/min (02/26/24 0900)   PRN Medications: albuterol , heparin , heparin , HYDROmorphone  (DILAUDID ) injection, ondansetron  (ZOFRAN ) IV, mouth rinse, polyvinyl alcohol , sodium chloride  flush  Assessment/Plan   Septic/vasoplegic shock - Now vanc/meropenem  for possible PNA  - Remains on vasopressin  0.04 while getting  CVVH.   Acute type A aortic dissection  - Echo 4/15: AAA measuring 5-5.7 cm - Emergent TEE 4/15: significant for torrential aortic regurgitation and dissection flap extending from the aortic root to the proximal ascending aorta  - CT C/A/P: Showed acute type A dissection with a 5.5 cm ascending aortic aneurysm extending into arch. Dissection goes down to infrarenal aorta and extends up into innominate and left common carotid arteries  - Now s/p aortic dissection repair 02/01/24 (Bentall/AVR) and reimplantation of coronaries.  3. CAD, Acute inferior STEMI - Admitted with STEMI. Post PCI found to also have aortic dissection - LHC with single vessel CAD involving the mid to distal RCA, successful PCI of the RCA with overlapping DESx3 - No aspirin  with heparin  gtt -  Continue plavix  75 mg + crestor  40 mg daily  4. Acute systolic HF > Cardiogenic shock - Post perfusion CGS.  - Echo 4/19: EF 25% RV moderate to severely decreased - Echo 5/5: EF 40% RV normal RA moderately dilated.IVC dilated.  - On VP 0.04. Off NE - Co-ox stable 68% - CVH 10, continue CVVH pulling UF net negative 100 cc/hr today.   5. Post-op hypoxic resp failure - extubated 4/18.  - re-intubated 4/19 - s/p tracheostomy - diffuse mucopurulent secretions on bronch 4/19. BAL GS: Mod WBC. Few gram variable rods. Rare yeast - CT C/A/P 4/27 w/ small postoperative seroma around aortic root/graft and possible PNA.  - Resp Cx 4/28 + Candida. Completed  micafungin  5/4 - On meropenem  + vanc.   - Failed trach collar trial yesterday, trying to pull off more volume.   6. PAF with post-termination pauses PVCs - Patient with AF intra-op.  - In SR. Non-sustained episodes of AFx3 overnight.  - Continue amio 30 mg per hour for now.  - continue heparin  gtt   7. Hypokalemia/hypernatremia - Sodium/Potassium stable.   8. AKI due to ATN/shock - Baseline SCr ~1.3-1.4 - CRRT Stopped 5/3. Restarted CRRT 5/7  - Continue to pull volume today  as above, transition to [facility] Sunday or Monday.   9. ABLA post-op - Received 6uPRBCs, 6 plt, 4 FFP and 5 cryo intra-op.  - 5/7 Got unit of blood.  - Hgb stable 8.9  10. Acute Venous Thrombosis  - + LLE DVT (gastroc vein) - + LUE superficial cephalic venin thrombosis - Continue  heparin  gtt  11. 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 Diffuse Encephalopathy - MRI- with acute infarct left parietal junction, subacute infarctions left occipital lobes. - EEG with mod diffuse encephalopathy - Neuro following, suspect small CVAs are not primary cause of his encephalopathy.  We were hopeful that correction of uremia would improve mental status but CVVH has not had a significant affect.  He will occasionally follow commands but not currently.  Continue to follow.   CRITICAL CARE Performed by: Peder Bourdon  Total critical care time: 40 minutes  Critical care time was exclusive of separately billable procedures and treating other patients.  Critical care was necessary to treat or prevent imminent or life-threatening deterioration.  Critical care was time spent personally by me on the following activities: development of treatment plan with patient and/or surrogate as well as nursing, discussions with consultants, evaluation of patient's response to treatment, examination of patient, obtaining history from patient or surrogate, ordering and performing treatments and interventions, ordering and review of laboratory studies, ordering and review of radiographic studies, pulse oximetry and re-evaluation of patient's condition.  Peder Bourdon 02/26/2024 9:24 AM

## 2024-02-26 NOTE — Progress Notes (Signed)
 NAMEHRIDHAAN KEIR, MRN:  [id number], DOB:  25-Jan-1956, LOS: 25 ADMISSION DATE:  02/01/2024, CONSULTATION DATE:  02/03/24 REFERRING MD:  Sherene Dilling, CHIEF COMPLAINT:  chest pain   History of Present Illness:  68 year old man w/ hx of HTN, obesity who presented on 4/15 with sudden onset chest pain.  EKG showing inferior STEMI and went to cath lab for RCA DES x 3.  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  Taken emergently to OR for Bentall+ ascending/arch replacement.  Procedure complicated by coagulopathy related to antiplatelet therapy from recent stent, multiple liters of blood products given.  Returned to ICU 4/16 on vent and PCCM consulted to assist with vent wean on 4/17. Since that time, patient has failed spontaneous breathing trials and remains intubated with trach.  He has persistently required CRRT since 5/7   Pertinent  Medical History   Past Medical History:  Diagnosis Date   Hypertension     Significant Hospital Events: Including procedures, antibiotic start and stop dates in addition to other pertinent events   4/16 RCA stent then Bentall, aortic arch replacement 4/18 extubated 4/19 reintubated in afternoon for aspiration, bronch, Delirious, low grade fevers, expressive aphasia 4/20 but mental status precludes extubation 4/21 started spiking fever again, meropenem  switched back from Zosyn , on vancomycin  as well 02/07/22 failed spontaneous breathing trial 4/24 Tachypnea, fever. Resp culture sent  4/25 underwent right heart cath showing normal filling pressure 4/26 remained in A-fib, became afebrile, Precedex  was switched to low-dose propofol  5/2 trach 5/3 ongoing fevers, leukocytosis; vanc/meropenem  started; CRRT held 5/4 HD catheter removed for ongoing leukocytosis 5/7 HD catheter placed and restarted on CRRT  Interim History / Subjective:  Overnight no acute events. Still on  cRRT.  Objective   Blood pressure 118/66, pulse 65, temperature 98.2 F (36.8 C), temperature source Oral, resp. rate (!) 33, height 6\' 1"  (1.854 m), weight 110.1 kg, SpO2 95%. CVP:  [1 mmHg-19 mmHg] 10 mmHg  Vent Mode: CPAP;PSV FiO2 (%):  [40 %-50 %] 40 % Set Rate:  [10 bmp] 10 bmp Vt Set:  [570 mL] 570 mL PEEP:  [8 cmH20] 8 cmH20 Pressure Support:  [10 cmH20] 10 cmH20 Plateau Pressure:  [15 cmH20-20 cmH20] 15 cmH20   Intake/Output Summary (Last 24 hours) at 02/26/2024 0917 Last data filed at 02/26/2024 0900 Gross per 24 hour  Intake 3896.5 ml  Output 7216 ml  Net -3319.5 ml   Filed Weights   02/24/24 0400 02/25/24 0556 02/26/24 0500  Weight: 114.9 kg 114.5 kg 110.1 kg    Examination: Critically ill-appearing man lying bed no acute distress, on decannulation CRRT Frequent and purposeful movements, moving arms more than legs.  One-time tract but did not maintain gaze.  Not following commands or acknowledging that he understand.  Occasional grimacing. 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  Holding aspirin  -Telemetry monitoring  Paroxysmal A-fib with RVR; currently rate controlled -  Continue amiodarone  infusion  -Heparin  infusion   Acute respiratory failure with hypoxia and hypercapnia- now trach dependent (5/2) -starting to get TC trials, failed 5/9  >> frothy secretions and tachypnea.  Placed back on support and increased CRRT rate -Trach care per protocol - LTV V - VAP prevention protocol - PAD protocol for sedation - Tolerating pressure support trial on PS 10 -Continue pulling fluid on CRRT  Sepsis Bilateral multifocal pneumonia- copious ongoing presumably noninfectious secretions, although no prolonged antibiotics course his white blood cell count is finally downtrending - Previously completed course of micafungin  on 5/4, although he would be an atypical candidate for Candida pneumonia, but this is what grew out on culture -Continue vancomycin  and meropenem  through the weekend -Continue midodrine , vasopressors as needed to maintain MAP greater than 65  Acute kidney injury due to ischemic ATN from shock, stable  - CRRT held 5/3; restarted 5/7 -Strict I's/O - Renally dose meds and avoid nephrotoxic meds -Needs ongoing gross volume removal  Strokes; suspect embolic Mixed anoxic, uremic encephalopathy plus ICU delirium- 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 prognosticating is challenging at this point but he has plateau'd with mental status improvement - Decrease clonazepam  to once daily at bedtime - Discontinue Seroquel  - Continue oxycodone  given frequent grimacing and concern for uncontrolled pain - PAD protocol -Family at bedside helps.  Wife and another family member were present today.  -Start PT and OT when able to participate -CRRT for metabolic clearance. - Need to assess if bethanechol  can give anticholinergic symptoms.   Perioperative acute blood loss anemia- stable; now just related to bone marrow stunning and phlebotomy -Transfuse for hemoglobin less than 7 or hemodynamically significant bleeding  Leukocytosis- overall improved -Continue antibiotics for now  Uncontrolled hyperglycemia; A1c 5.3 - SSI as needed -RG 20 minutes twice daily - 2 PEEP [place] insulin  aspart 5 units  every 4 hours with hold parameters - Goal blood glucose 140-180  Stage 1 sacral ulcer -Optimize nutrition - Frequent turns  LLE DVT-provoked by critical illness -Continue heparin  gtt; avoiding compressive dressings on that leg  Wife and another family member were updated at bedside.  His wife is still struggling with what the next best step for him is.  She knows his independence to be important to him.  It is unclear how long it would take him to get back to his baseline and what his best functional status would be in future.  Continuing current care.  Best Practice (right click and "Reselect all SmartList Selections" daily)   Diet/type: Tube feeds DVT prophylaxis heparin  gtt Pressure ulcer(s): yes, see nursing documentation GI prophylaxis: PPI Lines: Central line picc Foley:  Yes, and it is still needed Code Status:  DNR Last date of multidisciplinary goals of care discussion: 5/9 updated at bedside  Cc time: 50 min  Joesph Mussel, MD 02/26/24 8:30 PM [facility]  For contact information, see Amion. If no response to pager, please call PCCM consult pager. After hours, 7PM- 7AM, please call Elink.

## 2024-02-27 ENCOUNTER — Inpatient Hospital Stay (HOSPITAL_COMMUNITY)

## 2024-02-27 DIAGNOSIS — I5020 Unspecified systolic (congestive) heart failure: Secondary | ICD-10-CM | POA: Diagnosis not present

## 2024-02-27 DIAGNOSIS — Z930 Tracheostomy status: Secondary | ICD-10-CM

## 2024-02-27 LAB — MAGNESIUM: Magnesium: 3.1 mg/dL — ABNORMAL HIGH (ref 1.7–2.4)

## 2024-02-27 LAB — TYPE AND SCREEN
ABO/RH(D): B NEG
Antibody Screen: NEGATIVE

## 2024-02-27 LAB — CBC
HCT: 37.2 % — ABNORMAL LOW (ref 39.0–52.0)
HCT: 37.1 % — ABNORMAL LOW (ref 39.0–52.0)
HCT: 36.7 % — ABNORMAL LOW (ref 39.0–52.0)
Hemoglobin: 10.6 g/dL — ABNORMAL LOW (ref 13.0–17.0)
Hemoglobin: 10.8 g/dL — ABNORMAL LOW (ref 13.0–17.0)
Hemoglobin: 10.7 g/dL — ABNORMAL LOW (ref 13.0–17.0)
MCH: 26.1 pg (ref 26.0–34.0)
MCH: 26.6 pg (ref 26.0–34.0)
MCH: 26.9 pg (ref 26.0–34.0)
MCHC: 28.5 g/dL — ABNORMAL LOW (ref 30.0–36.0)
MCHC: 29.1 g/dL — ABNORMAL LOW (ref 30.0–36.0)
MCHC: 29.2 g/dL — ABNORMAL LOW (ref 30.0–36.0)
MCV: 91.6 fL (ref 80.0–100.0)
MCV: 91.4 fL (ref 80.0–100.0)
MCV: 92.2 fL (ref 80.0–100.0)
Platelets: 537 10*3/uL — ABNORMAL HIGH (ref 150–400)
Platelets: 502 10*3/uL — ABNORMAL HIGH (ref 150–400)
Platelets: 518 10*3/uL — ABNORMAL HIGH (ref 150–400)
RBC: 4.06 MIL/uL — ABNORMAL LOW (ref 4.22–5.81)
RBC: 4.06 MIL/uL — ABNORMAL LOW (ref 4.22–5.81)
RBC: 3.98 MIL/uL — ABNORMAL LOW (ref 4.22–5.81)
RDW: 18.3 % — ABNORMAL HIGH (ref 11.5–15.5)
RDW: 18.2 % — ABNORMAL HIGH (ref 11.5–15.5)
RDW: 18.2 % — ABNORMAL HIGH (ref 11.5–15.5)
WBC: 16.2 10*3/uL — ABNORMAL HIGH (ref 4.0–10.5)
WBC: 17.6 10*3/uL — ABNORMAL HIGH (ref 4.0–10.5)
WBC: 20.3 10*3/uL — ABNORMAL HIGH (ref 4.0–10.5)
nRBC: 2 % — ABNORMAL HIGH (ref 0.0–0.2)
nRBC: 1.5 % — ABNORMAL HIGH (ref 0.0–0.2)
nRBC: 1.4 % — ABNORMAL HIGH (ref 0.0–0.2)

## 2024-02-27 LAB — COOXEMETRY PANEL
Carboxyhemoglobin: 1.2 % (ref 0.5–1.5)
Carboxyhemoglobin: 1.5 % (ref 0.5–1.5)
Carboxyhemoglobin: 1.3 % (ref 0.5–1.5)
Methemoglobin: <0.7 % (ref 0.0–1.5)
Methemoglobin: <0.7 % (ref 0.0–1.5)
Methemoglobin: <0.7 % (ref 0.0–1.5)
O2 Saturation: 44.6 %
O2 Saturation: 74.6 %
O2 Saturation: 63 %
Total hemoglobin: 11.2 g/dL — ABNORMAL LOW (ref 12.0–16.0)
Total hemoglobin: 10.6 g/dL — ABNORMAL LOW (ref 12.0–16.0)
Total hemoglobin: 11.4 g/dL — ABNORMAL LOW (ref 12.0–16.0)

## 2024-02-27 LAB — RENAL FUNCTION PANEL
Albumin: 3.2 g/dL — ABNORMAL LOW (ref 3.5–5.0)
Albumin: 3.1 g/dL — ABNORMAL LOW (ref 3.5–5.0)
Anion gap: 13 (ref 5–15)
Anion gap: 13 (ref 5–15)
BUN: 50 mg/dL — ABNORMAL HIGH (ref 8–23)
BUN: 49 mg/dL — ABNORMAL HIGH (ref 8–23)
CO2: 23 mmol/L (ref 22–32)
CO2: 22 mmol/L (ref 22–32)
Calcium: 9.5 mg/dL (ref 8.9–10.3)
Calcium: 9 mg/dL (ref 8.9–10.3)
Chloride: 95 mmol/L — ABNORMAL LOW (ref 98–111)
Chloride: 96 mmol/L — ABNORMAL LOW (ref 98–111)
Creatinine, Ser: 1.47 mg/dL — ABNORMAL HIGH (ref 0.61–1.24)
Creatinine, Ser: 1.58 mg/dL — ABNORMAL HIGH (ref 0.61–1.24)
GFR, Estimated: 52 mL/min — ABNORMAL LOW (ref >60)
GFR, Estimated: 47 mL/min — ABNORMAL LOW (ref >60)
Glucose, Bld: 202 mg/dL — ABNORMAL HIGH (ref 70–99)
Glucose, Bld: 306 mg/dL — ABNORMAL HIGH (ref 70–99)
Phosphorus: 3.4 mg/dL (ref 2.5–4.6)
Phosphorus: 3.5 mg/dL (ref 2.5–4.6)
Potassium: 4.9 mmol/L (ref 3.5–5.1)
Potassium: 5.2 mmol/L — ABNORMAL HIGH (ref 3.5–5.1)
Sodium: 131 mmol/L — ABNORMAL LOW (ref 135–145)
Sodium: 131 mmol/L — ABNORMAL LOW (ref 135–145)

## 2024-02-27 LAB — GLUCOSE, CAPILLARY
Glucose-Capillary: 197 mg/dL — ABNORMAL HIGH (ref 70–99)
Glucose-Capillary: 131 mg/dL — ABNORMAL HIGH (ref 70–99)
Glucose-Capillary: 159 mg/dL — ABNORMAL HIGH (ref 70–99)
Glucose-Capillary: 212 mg/dL — ABNORMAL HIGH (ref 70–99)

## 2024-02-27 LAB — APTT: aPTT: 85 s — ABNORMAL HIGH (ref 24–36)

## 2024-02-27 LAB — HEPARIN LEVEL (UNFRACTIONATED)
Heparin Unfractionated: 0.76 [IU]/mL — ABNORMAL HIGH (ref 0.30–0.70)
Heparin Unfractionated: 0.62 [IU]/mL (ref 0.30–0.70)

## 2024-02-27 LAB — CG4 I-STAT (LACTIC ACID): Lactic Acid, Venous: 1.8 mmol/L (ref 0.5–1.9)

## 2024-02-27 MED ORDER — SODIUM CHLORIDE 0.9 % IV SOLN
200.0000 mg | INTRAVENOUS | Status: DC
  Filled 2024-02-27: qty 10

## 2024-02-27 MED ORDER — MIDAZOLAM HCL 2 MG/2ML IJ SOLN
2.0000 mg | INTRAMUSCULAR | Status: DC | PRN
  Administered 2024-02-27: 4 mg via INTRAVENOUS
  Filled 2024-02-27: qty 4

## 2024-02-27 MED ORDER — DOBUTAMINE-DEXTROSE 4-5 MG/ML-% IV SOLN
2.5000 ug/kg/min | INTRAVENOUS | Status: DC
  Administered 2024-02-27: 2.5 ug/kg/min via INTRAVENOUS

## 2024-02-27 MED ORDER — LINEZOLID 600 MG/300ML IV SOLN
600.0000 mg | Freq: Two times a day (BID) | INTRAVENOUS | Status: DC
  Administered 2024-02-27: 600 mg via INTRAVENOUS
  Filled 2024-02-27 (×2): qty 300

## 2024-02-27 MED ORDER — MORPHINE BOLUS VIA INFUSION
5.0000 mg | INTRAVENOUS | Status: DC | PRN
  Administered 2024-02-27 – 2024-02-28 (×7): 5 mg via INTRAVENOUS

## 2024-02-27 MED ORDER — HYDROMORPHONE HCL 1 MG/ML IJ SOLN
1.0000 mg | INTRAMUSCULAR | Status: DC | PRN
  Administered 2024-02-28: 1 mg via INTRAVENOUS
  Filled 2024-02-27: qty 1

## 2024-02-27 MED ORDER — SODIUM CHLORIDE 0.9% FLUSH: 3.0000 mL | INTRAVENOUS | Status: DC | PRN

## 2024-02-27 MED ORDER — EPINEPHRINE HCL 5 MG/250ML IV SOLN IN NS
0.5000 ug/min | INTRAVENOUS | Status: DC
  Administered 2024-02-27: 1 ug/min via INTRAVENOUS

## 2024-02-27 MED ORDER — DOBUTAMINE-DEXTROSE 4-5 MG/ML-% IV SOLN
INTRAVENOUS | Status: AC
  Filled 2024-02-27: qty 250

## 2024-02-27 MED ORDER — MORPHINE 100MG IN NS 100ML (1MG/ML) PREMIX INFUSION
0.0000 mg/h | INTRAVENOUS | Status: DC
  Administered 2024-02-27: 5 mg/h via INTRAVENOUS
  Administered 2024-02-27: 30 mg/h via INTRAVENOUS
  Filled 2024-02-27 (×2): qty 100

## 2024-02-27 MED ORDER — GLYCOPYRROLATE 1 MG PO TABS
1.0000 mg | ORAL_TABLET | ORAL | Status: DC | PRN
  Filled 2024-02-27: qty 1

## 2024-02-27 MED ORDER — SODIUM CHLORIDE 0.9% FLUSH
3.0000 mL | Freq: Two times a day (BID) | INTRAVENOUS | Status: DC
  Administered 2024-02-27: 10 mL via INTRAVENOUS

## 2024-02-27 MED ORDER — ACETAMINOPHEN 650 MG RE SUPP: 650.0000 mg | Freq: Four times a day (QID) | RECTAL | Status: DC | PRN

## 2024-02-27 MED ORDER — GLYCOPYRROLATE 0.2 MG/ML IJ SOLN: 0.2000 mg | INTRAMUSCULAR | Status: DC | PRN

## 2024-02-27 MED ORDER — MIDAZOLAM HCL 2 MG/2ML IJ SOLN
2.0000 mg | INTRAMUSCULAR | Status: DC | PRN
  Administered 2024-02-27: 2 mg via INTRAVENOUS
  Filled 2024-02-27 (×2): qty 2

## 2024-02-27 MED ORDER — ACETAMINOPHEN 325 MG PO TABS: 650.0000 mg | ORAL_TABLET | Freq: Four times a day (QID) | ORAL | Status: DC | PRN

## 2024-02-27 MED ORDER — OXYCODONE HCL 5 MG PO TABS
5.0000 mg | ORAL_TABLET | ORAL | Status: DC
  Administered 2024-02-27 (×3): 5 mg
  Filled 2024-02-27 (×3): qty 1

## 2024-02-27 MED ORDER — GLYCOPYRROLATE 0.2 MG/ML IJ SOLN
0.2000 mg | INTRAMUSCULAR | Status: DC | PRN
  Administered 2024-02-27 – 2024-02-28 (×2): 0.2 mg via INTRAVENOUS
  Filled 2024-02-27 (×2): qty 1

## 2024-02-27 NOTE — Progress Notes (Signed)
 Dustin Davenport, MRN:  [id number], DOB:  1955-10-23, LOS: 26 ADMISSION DATE:  02/01/2024, CONSULTATION DATE:  02/03/24 REFERRING MD:  Sherene Dilling, CHIEF COMPLAINT:  chest pain   History of Present Illness:  68 year old man w/ hx of HTN, obesity who presented on 4/15 with sudden onset chest pain.  EKG showing inferior STEMI and went to cath lab for RCA DES x 3.  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  Taken emergently to OR for Bentall+ ascending/arch replacement.  Procedure complicated by coagulopathy related to antiplatelet therapy from recent stent, multiple liters of blood products given.  Returned to ICU 4/16 on vent and PCCM consulted to assist with vent wean on 4/17. Since that time, patient has failed spontaneous breathing trials and remains intubated with trach.  He has persistently required CRRT since 5/7   Pertinent  Medical History   Past Medical History:  Diagnosis Date   Hypertension     Significant Hospital Events: Including procedures, antibiotic start and stop dates in addition to other pertinent events   4/16 RCA stent then Bentall, aortic arch replacement 4/18 extubated 4/19 reintubated in afternoon for aspiration, bronch, Delirious, low grade fevers, expressive aphasia 4/20 but mental status precludes extubation 4/21 started spiking fever again, meropenem  switched back from Zosyn , on vancomycin  as well 02/07/22 failed spontaneous breathing trial 4/24 Tachypnea, fever. Resp culture sent  4/25 underwent right heart cath showing normal filling pressure 4/26 remained in A-fib, became afebrile, Precedex  was switched to low-dose propofol  5/2 trach 5/3 ongoing fevers, leukocytosis; vanc/meropenem  started; CRRT held 5/4 HD catheter removed for ongoing leukocytosis 5/7 HD catheter placed and restarted on CRRT  Interim History / Subjective:  Low dose NE, off vasopressin . Afebrile  overnight.  This afternoon rising NE requirements.  Objective   Blood pressure 115/74, pulse 80, temperature 98.7 F (37.1 C), temperature source Axillary, resp. rate (!) 26, height 6\' 1"  (1.854 m), weight 106.3 kg, SpO2 98%. CVP:  [3 mmHg-40 mmHg] 6 mmHg  Vent Mode: PRVC FiO2 (%):  [40 %-60 %] 60 % Set Rate:  [10 bmp] 10 bmp Vt Set:  [570 mL] 570 mL PEEP:  [5 cmH20-8 cmH20] 5 cmH20 Pressure Support:  [10 cmH20] 10 cmH20 Plateau Pressure:  [22 cmH20-24 cmH20] 22 cmH20   Intake/Output Summary (Last 24 hours) at 02/27/2024 0813 Last data filed at 02/27/2024 0800 Gross per 24 hour  Intake 4284.78 ml  Output 7451.1 ml  Net -3166.32 ml   Filed Weights   02/25/24 0556 02/26/24 0500 02/27/24 0402  Weight: 114.5 kg 110.1 kg 106.3 kg    Examination: Critically ill appearing man lying in bed in NAD [place]/AT, eyes anicteric Trach in place Tachypnea, decreased basilar breath sounds but CTAB Abd soft, NT No cyanosis, improving edema Eyes open, not tracking to voice. Not answering y/n questions.   Coox 75% Na+ 131 BUN 50, Cr 1.47 on CRRT WBC 16.2 H/H 10.6/37.2 Platelets 537 PTT 85 CXR personally reviewed> rotated film, trach in place, no lobar opacities EKG personally reviewed> Afib, similar to previous EKGs- Q waves laterally, nonspecific interventricular conduction delay. Qtc WNL.   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 Now rising pressors -check EKG, LA, coox, CBC now to evaluate shock -heparin , aspirin , statin -tele monitoring  Paroxysmal A-fib with RVR; currently  rate controlled -con't heparin , amiodarone    Acute respiratory failure with hypoxia and hypercapnia- now trach dependent (5/2) -starting to get TC trials, failed 5/9  >> frothy secretions and tachypnea. Placed back on support and increased CRRT rate -trach care per protocol -LTVV -VAP prevention protocol -con't vent weaning efforts -slow down fluid removal on vent  Sepsis Bilateral multifocal pneumonia- copious ongoing presumably noninfectious secretions, although no prolonged antibiotics course his white blood cell count was finally downtrending> now rising again - Previously completed course of micafungin  on 5/4, although he would be an atypical candidate for Candida pneumonia, but this is what grew out on culture -Has been on a long course of vancomycin  and meropenem  with plans to continue through the weekend. Has not had these interrupted.  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 prognosticating is challenging at this point but he has plateau'd with mental status improvement - Decrease clonazepam  to once daily at bedtime - con't; want to prevent withdrawal  - off seroquel  - Continue oxycodone  given frequent grimacing and concern for uncontrolled pain> increased frequency today -PAD protocol -CRRT for metabolic clearance  Perioperative acute blood loss anemia- stable; now just related to bone marrow stunning and phlebotomy -Transfuse for hemoglobin less than 7 or hemodynamically significant bleeding; stable CBC today and no echo signs to suggest underfilled heart or IVC  Leukocytosis- rapidly worsening today. No fevers, but may have  been masked on cRRT -Continue antibiotics for now> changed to linezolid, con't meropenem , added back micafungin  due to decompensation  Uncontrolled hyperglycemia; A1c 5.3 -SSI as needed -glargine 20 units BID -aspart TF coverage 2 units q4h -goal BG 140-180  Stage 1 sacral ulcer -turns, nutrition -unfortunately rising pressor requirements aren't helping  LLE DVT-provoked by critical illness -holding heparin  due to acute instability, although no clinical signs of bleeding or drop in H/H  Wife and daughter updated at bedside. I cannot fully explain his decompensation. He has not had significant EKG changes today, normal lactate, but rapidly increased pressor requirements and WBC, so infectious seems most likely. No obvious source. Discussed this as a major setback. I worry about his recovery potential being more limited longer term. There has still been a lot of uncertainty about his recovery potential up to now. We talked about being aggressive getting scans, putting in more lines, changing antibiotics. We discussed what compfrt care would look like if they think that is best. No plans for more procedures, and wife & daughter will talk more to discuss. Will con't all current measures.    Best Practice (right click and "Reselect all SmartList Selections" daily)   Diet/type: Tube feeds DVT prophylaxis heparin  gtt Pressure ulcer(s): yes, see nursing documentation GI prophylaxis: PPI Lines: Central line picc Foley:  Yes, and it is still needed Code Status:  DNR Last date of multidisciplinary goals of care discussion: 5/11 updated at bedside  Cc time: 60 min  Joesph Mussel, MD 02/27/24 6:49 PM [facility]  For contact information, see Amion. If no response to pager, please call PCCM consult pager. After hours, 7PM- 7AM, please call Elink.

## 2024-02-27 NOTE — Progress Notes (Signed)
 eLink Physician-Brief Progress Note [name] DOB: Feb 08, 1956 MRN: [id number]   Date of Service  02/27/2024  HPI/Events of Note  Patient on comfort care with family at bedside and on morphine  drip needing rapid escalation and boluses. RN asking if the dose can ne modified.   eICU Interventions  Done and also made the versed  Q1h instead of Q4 for anxiety      Intervention Category Major Interventions: Respiratory failure - evaluation and management  Marsela Kuan G Leeasia Secrist 02/27/2024, 10:44 PM

## 2024-02-27 NOTE — Progress Notes (Addendum)
 Rapidly rising pressor requirements w/o good explanation  Now on DBA, epi, vaso, NE LA was <2, coox still >60% BMP with normal bicarb CRRT running even Bedside US - IVC not collapsing, fair echo windows, both ventricles low function but no obvious WMA. Not underfilled or dilated.  No desaturations Stopped heparin  and amiodarone    STAT CBC now Discussed GOC again with his wife. Unfortunately this is a setback and it's hard to know how big without a clear diagnosis. Her daughter is at bedside. In light of previous discussions about him being very independent and proud, I am more worried today about his potential to recover. We discussed options about how aggressive to be from comfort care up to putting in more lines and doing scans to look for sources of sepsis or bleeding and blood transfusions.   Wife and daughter to discuss.   Additional cc time: 30 min  Joesph Mussel, DO 02/27/24 5:47 PM [facility]  For contact information, see Amion. If no response to pager, please call PCCM consult pager. After hours, 7PM- 7AM, please call Elink.   Rising WBC and stable H/H on CBC. Most consistent with worsening septic shock at this point, despite being on vanc, meropenem . Family discussing GOC. Additional family members coming to the hospital.  Joesph Mussel, DO 02/27/24 6:41 PM [facility]

## 2024-02-27 NOTE — Progress Notes (Signed)
 PHARMACY - ANTICOAGULATION CONSULT NOTE  Pharmacy Consult for UFH IV Infusion Indication: atrial fibrillation / LLE DVT  Allergies  Allergen Reactions   Bee Venom     Patient Measurements: Height: 6\' 1"  (185.4 cm) Weight: 106.3 kg (234 lb 5.6 oz) IBW/kg (Calculated) : 79.9 HEPARIN  DW (KG): 106  Vital Signs: Temp: 98.3 F (36.8 C) (05/11 1147) Temp Source: Axillary (05/11 1147) BP: 64/47 (05/11 1445) Pulse Rate: 71 (05/11 1445)  Labs: Recent Labs    02/25/24 0430 02/25/24 1619 02/26/24 0427 02/26/24 1606 02/27/24 0413 02/27/24 1354  HGB 8.5*  --  8.9*  --  10.6*  --   HCT 28.8*  --  30.7*  --  37.2*  --   PLT 489*  --  478*  --  537*  --   APTT 90*  --  101*  --  85*  --   HEPARINUNFRC  --   --  0.51  --  0.76* 0.62  CREATININE 0.94   < > 0.93 1.19 1.47*  --    < > = values in this interval not displayed.    Estimated Creatinine Clearance: 61.6 mL/min (A) (by C-G formula based on SCr of 1.47 mg/dL (H)).   Medical History: Past Medical History:  Diagnosis Date   Hypertension     Medications:   Infusions:   amiodarone  30 mg/hr (02/27/24 1400)   feeding supplement (VITAL 1.5 CAL) 65 mL/hr at 02/27/24 1400   heparin  1,800 Units/hr (02/27/24 1400)   meropenem  (MERREM ) IV 1 g (02/27/24 1357)   norepinephrine  (LEVOPHED ) Adult infusion 5 mcg/min (02/27/24 1400)   prismasol  BGK 4/2.5 400 mL/hr at 02/27/24 0436   prismasol  BGK 4/2.5 400 mL/hr at 02/27/24 0436   prismasol  BGK 4/2.5 1,500 mL/hr at 02/27/24 1407   thiamine  (VITAMIN B1) injection Stopped (02/27/24 1033)   vancomycin  Stopped (02/26/24 1702)   vasopressin  0.04 Units/min (02/27/24 1400)   PRN: albuterol , heparin , heparin , HYDROmorphone  (DILAUDID ) injection, ondansetron  (ZOFRAN ) IV, mouth rinse, polyvinyl alcohol , sodium chloride  flush    Assessment: 68 y.o. male with PMH HTN and HLD. Admitted with STEMI now with CGS. Found to have AAA requiring emergent surgical repair. Now s/p Bentall/AVR.   Pharmacy asked to start IV heparin  4/27 for afib. LLE DVT and superficial thrombus in LUE found on doppler 4/28.  Heparin  level slightly above goal at 0.62, will reduce rate slightly, recheck in am.  Goal of Therapy:  Heparin  level 0.3-0.5 units/ml Monitor platelets by anticoagulation protocol: Yes   Plan:  Reduce heparin  infusion to 1700 units/hr Daily heparin  level and CBC Monitor s/s bleeding.   Levin Reamer, PharmD, BCPS, [facility] Clinical Pharmacist [phone] Please check AMION for all [facility] Pharmacy numbers 02/27/2024

## 2024-02-27 NOTE — Progress Notes (Signed)
 Patient ID: Dustin Davenport, male   DOB: 12-22-1955, 68 y.o.   MRN: [id number]      Advanced Heart Failure Rounding Note  Cardiologist: None  Chief Complaint: STEMI> Cardiogenic shock> Now s/p aortic dissection repair + Bentall Patient Profile   Dustin Davenport is a 68 y.o. male with HTN and HLD. Admitted with STEMI now with CGS. Found to have AAA and taken emergently to the OR. Now s/p Bentall/AVR and reimplantation of coronaries.  Significant events:    - Echo 4/15: AAA measuring 5-5.7 cm. Followed by emergent TEE 4/15 which was significant for torrential aortic regurgitation and dissection flap extending from the aortic root to the proximal ascending aorta  - CT C/A/P: Showed acute type A dissection with a 5.5 cm ascending aortic aneurysm extending into arch. Dissection goes down to infrarenal aorta and extends up into innominate and left common carotid artery - 4/16 Underwent aortic dissection repair (Bentall/AVR and reimplantation of coronaries. Intra-op course c/b coagulopathy due to Effient  (PCI of RCA earlier in the day)).  - Extubated 4/18 - Re-intubated 4/19. Bronch with diffuse mucopurulent secretions - 4/26 Afib with post conversion pause. Amiodarone  decreased.  - 4/28 Started back on  Vanc + Meropenem  and Micafungin  added 4/28 per CCM. Resp Cx + Candida   - 4/28 LLE Venous Doppler +DVT (gastroc vein), LUE Venous Doppler + for superficial cephalic vein thrombosis  -5/1 CT head. No acute findings. Started on CRRT.  -No acute events overnight.  -5/3 CRRT stopped.  -5/5 Diuresed with IV lasix . Echo EF 40% RV normal RA moderately dilated.IVC dilated. - 5/6 MRI Brain - with acute infarct left parietal junction, subacute infarctions left occipital lobes. - 5/7 CRRT restarted.   Subjective:    Co-ox 76%. On vasopressin  0.04 and NE 6, NE restarted overnight.  Pulling net negative UF 100 cc/hr via CVVH.  I/Os net negative 3359 with weight down. CVP 9.   In NSR on amiodarone  gtt and  heparin  gtt.    EEG with mod diffuse encephalopathy, no seizures. He will occasionally follow commands per nursing and his wife, but not currently for me.   He remains on coverage for PNA with vancomycin /meropenem .   Objective:    Weight Range: 106.3 kg Body mass index is 30.92 kg/m.   Vital Signs:   Temp:  [98.2 F (36.8 C)-98.7 F (37.1 C)] 98.7 F (37.1 C) (05/11 0807) Pulse Rate:  [59-82] 71 (05/11 1000) Resp:  [14-40] 19 (05/11 1000) BP: (58-149)/(40-104) 122/76 (05/11 1000) SpO2:  [87 %-99 %] 97 % (05/11 1000) FiO2 (%):  [40 %-60 %] 60 % (05/11 0818) Weight:  [106.3 kg] 106.3 kg (05/11 0402) Last BM Date : 02/27/24  Weight change: Filed Weights   02/25/24 0556 02/26/24 0500 02/27/24 0402  Weight: 114.5 kg 110.1 kg 106.3 kg   Intake/Output:  Intake/Output Summary (Last 24 hours) at 02/27/2024 1057 Last data filed at 02/27/2024 1000 Gross per 24 hour  Intake 4296.16 ml  Output 7565.1 ml  Net -3268.94 ml   Physical Exam   CVP 9 General: NAD Neck: Tracheostomy.  JVP difficult, no thyromegaly or thyroid nodule.  Lungs: Decreased at bases. CV: Nondisplaced PMI.  Heart regular S1/S2, no S3/S4, no murmur. 1+ ankle edema.  Abdomen: Soft, nontender, no hepatosplenomegaly, no distention.  Skin: Intact without lesions or rashes.  Neurologic: Not responsive for me.  Extremities: No clubbing or cyanosis.  HEENT: Normal.   Telemetry   NSR 70s (personally reviewed)  Labs  CBC Recent Labs    02/26/24 0427 02/27/24 0413  WBC 13.2* 16.2*  HGB 8.9* 10.6*  HCT 30.7* 37.2*  MCV 91.9 91.6  PLT 478* 537*   Basic Metabolic Panel Recent Labs    16/10/96 0427 02/26/24 1606 02/27/24 0413  NA 133* 130* 131*  K 4.0 5.0 4.9  CL 98 96* 95*  CO2 23 24 23   GLUCOSE 199* 347* 202*  BUN 43* 50* 50*  CREATININE 0.93 1.19 1.47*  CALCIUM  8.8* 8.7* 9.5  MG 2.7*  --  3.1*  PHOS 3.0 2.6 3.4   Liver Function Tests Recent Labs    02/26/24 1606 02/27/24 0413   ALBUMIN  2.7* 3.2*    Medications:    Scheduled Medications:  sodium chloride    Intravenous Once   acetaminophen   650 mg Per Tube Q6H   bethanechol   10 mg Per Tube TID   Chlorhexidine  Gluconate Cloth  6 each Topical Daily   clonazePAM   0.5 mg Per Tube QHS   clopidogrel   75 mg Per Tube Daily   feeding supplement (PROSource TF20)  60 mL Per Tube TID   Gerhardt's butt cream   Topical Daily   insulin  aspart  0-20 Units Subcutaneous Q4H   insulin  aspart  5 Units Subcutaneous Q4H   insulin  glargine-yfgn  20 Units Subcutaneous BID   midodrine   10 mg Per Tube TID WC   multivitamin  1 tablet Per Tube QHS   nutrition supplement (JUVEN)  1 packet Per Tube BID BM   mouth rinse  15 mL Mouth Rinse Q2H   oxyCODONE   5 mg Per Tube Q4H   pantoprazole  (PROTONIX ) IV  40 mg Intravenous QHS   rosuvastatin   40 mg Per Tube Daily   sodium chloride  flush  10-40 mL Intracatheter Q12H   [START ON 02/29/2024] thiamine   100 mg Per Tube Daily   Infusions:  amiodarone  30 mg/hr (02/27/24 1000)   feeding supplement (VITAL 1.5 CAL) 65 mL/hr at 02/27/24 1000   heparin  1,800 Units/hr (02/27/24 1006)   meropenem  (MERREM ) IV Stopped (02/27/24 0543)   norepinephrine  (LEVOPHED ) Adult infusion 6 mcg/min (02/27/24 1000)   prismasol  BGK 4/2.5 400 mL/hr at 02/27/24 0436   prismasol  BGK 4/2.5 400 mL/hr at 02/27/24 0436   prismasol  BGK 4/2.5 1,500 mL/hr at 02/27/24 1049   thiamine  (VITAMIN B1) injection 500 mg (02/27/24 1003)   vancomycin  Stopped (02/26/24 1702)   vasopressin  0.04 Units/min (02/27/24 1000)   PRN Medications: albuterol , heparin , heparin , HYDROmorphone  (DILAUDID ) injection, ondansetron  (ZOFRAN ) IV, mouth rinse, polyvinyl alcohol , sodium chloride  flush  Assessment/Plan   Septic/vasoplegic shock - Now vanc/meropenem  for possible PNA  - Remains on vasopressin  0.04 and NE 6.  Think primarily vasodilatory shock, co-ox 76%.   Acute type A aortic dissection  - Echo 4/15: AAA measuring 5-5.7 cm - Emergent  TEE 4/15: significant for torrential aortic regurgitation and dissection flap extending from the aortic root to the proximal ascending aorta  - CT C/A/P: Showed acute type A dissection with a 5.5 cm ascending aortic aneurysm extending into arch. Dissection goes down to infrarenal aorta and extends up into innominate and left common carotid arteries  - Now s/p aortic dissection repair 02/01/24 (Bentall/AVR) and reimplantation of coronaries.  3. CAD, Acute inferior STEMI - Admitted with STEMI. Post PCI found to also have aortic dissection - LHC with single vessel CAD involving the mid to distal RCA, successful PCI of the RCA with overlapping DESx3 - No aspirin  with heparin  gtt - Continue plavix  75 mg +  crestor  40 mg daily  4. Acute systolic HF > Cardiogenic shock - Post perfusion CGS.  - Echo 4/19: EF 25% RV moderate to severely decreased - Echo 5/5: EF 40% RV normal RA moderately dilated.IVC dilated.  - On VP 0.04, NE 6.  Think primarily vasodilatory shock as above.  - Co-ox stable 76% - CVP 9 and looks volume overloaded on exam, continue CVVH pulling UF net negative 100 cc/hr today.  Will need eventual conversion to iHD.   5. Post-op hypoxic resp failure - extubated 4/18.  - re-intubated 4/19 - s/p tracheostomy - diffuse mucopurulent secretions on bronch 4/19. BAL GS: Mod WBC. Few gram variable rods. Rare yeast - CT C/A/P 4/27 w/ small postoperative seroma around aortic root/graft and possible PNA.  - Resp Cx 4/28 + Candida. Completed  micafungin  5/4 - On meropenem  + vanc.   - Needs more volume off.    6. PAF with post-termination pauses PVCs - Patient with AF intra-op.  - In SR. Non-sustained episodes of AFx3 overnight.  - Continue amio 30 mg per hour for now.  - continue heparin  gtt   7. Hypokalemia/hypernatremia - Sodium/Potassium stable.   8. AKI due to ATN/shock - Baseline SCr ~1.3-1.4 - CRRT Stopped 5/3. Restarted CRRT 5/7  - Continue to pull volume today as above,  transition to iHD ?Monday.   9. ABLA post-op - Received 6uPRBCs, 6 plt, 4 FFP and 5 cryo intra-op.  - 5/7 Got unit of blood.  - Hgb stable 10.6  10. Acute Venous Thrombosis  - + LLE DVT (gastroc vein) - + LUE superficial cephalic venin thrombosis - Continue  heparin  gtt  11. 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 Diffuse Encephalopathy - MRI- with acute infarct left parietal junction, subacute infarctions left occipital lobes. - EEG with mod diffuse encephalopathy - Neuro following, suspect small CVAs are not primary cause of his encephalopathy.  We were hopeful that correction of uremia would improve mental status but CVVH has not had a significant affect.  He will occasionally follow commands but not currently.  Continue to follow.   Cardiology to sign off for now.  Persistent encephalopathy, renal failure, and vasodilatory shock are his primary problems at this time.  Discussed with Dr. Fulton Job, she will call us  to return if needed.   CRITICAL CARE Performed by: Peder Bourdon  Total critical care time: 40 minutes  Critical care time was exclusive of separately billable procedures and treating other patients.  Critical care was necessary to treat or prevent imminent or life-threatening deterioration.  Critical care was time spent personally by me on the following activities: development of treatment plan with patient and/or surrogate as well as nursing, discussions with consultants, evaluation of patient's response to treatment, examination of patient, obtaining history from patient or surrogate, ordering and performing treatments and interventions, ordering and review of laboratory studies, ordering and review of radiographic studies, pulse oximetry and re-evaluation of patient's condition.  Peder Bourdon 02/27/2024 10:57 AM

## 2024-02-27 NOTE — Progress Notes (Signed)
 PHARMACY - ANTICOAGULATION CONSULT NOTE  Pharmacy Consult for heparin  Indication: Afib/VTE  Labs: Recent Labs    02/24/24 1119 02/25/24 0429 02/25/24 0430 02/25/24 1619 02/26/24 0427 02/26/24 1606 02/27/24 0413  HGB   < >  --  8.5*  --  8.9*  --  10.6*  HCT  --   --  28.8*  --  30.7*  --  37.2*  PLT  --   --  489*  --  478*  --  537*  APTT  --   --  90*  --  101*  --  85*  HEPARINUNFRC  --  0.46  --   --  0.51  --  0.76*  CREATININE  --   --  0.94   < > 0.93 1.19 1.47*   < > = values in this interval not displayed.   Assessment: 68yo male now supratherapeutic on heparin  after several levels at goal; no infusion issues or signs of bleeding per RN.  Goal of Therapy:  Heparin  level 0.3-0.5 units/ml   Plan:  Decrease heparin  infusion by 10% to 1800 units/hr. Check level in 8 hours.   Lonnie Roberts, PharmD, BCPS 02/27/2024 5:39 AM

## 2024-02-27 NOTE — Progress Notes (Signed)
 Patient ID: Dustin Davenport, male   DOB: 10/31/1955, 68 y.o.   MRN: [id number] [facility] Progress Note   Assessment/ Plan:   1. Acute kidney Injury: Secondary to ischemic ATN in the setting of STEMI/type A aortic dissection.  Restarted back on CRRT because of worsening azotemia and its contribution on his decreased mentation/responsiveness.  Will start the effort to evaluate for stability to transition to intermittent dialysis; this is currently limited by his pressor requirements.  Per neurology, unable to prognosticate for several months and we will plan to continue renal replacement therapy for now.  Will need conversion of temporary to tunneled dialysis catheter down the road. 2.  Coronary artery disease: Status post inferior wall STEMI status post PCI to RCA complicated by acute systolic heart failure.  Hemodynamically appears to be stabilized on fixed dose vasopressin /Levophed . 3.  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 4.  Anemia: Secondary to aortic dissection/surgical losses and acute/critical illness.  Continue to monitor for PRBC indications. 5.  Hyponatremia: Medications reviewed-not getting any hypotonic fluids or free water  flushes.  Will continue to monitor on CRRT with UF.  Subjective:   Started on Levophed  overnight   Objective:   BP 115/74   Pulse 80   Temp 98.2 F (36.8 C) (Axillary)   Resp (!) 26   Ht 6\' 1"  (1.854 m)   Wt 106.3 kg   SpO2 98%   BMI 30.92 kg/m   Intake/Output Summary (Last 24 hours) at 02/27/2024 0751 Last data filed at 02/27/2024 0700 Gross per 24 hour  Intake 4290.16 ml  Output 7649.1 ml  Net -3358.94 ml   Weight change: -3.8 kg  Physical Exam: Gen: Appears comfortable on tracheal collar.  Pushes my hand away when asked to squeeze it CVS: Pulse regular rhythm, normal rate, S1 and S2 distant Resp: Coarse breath sounds without discrete rales or  rhonchi Abd: Soft, obese, nontender, bowel sounds normal Ext: 1+ upper extremity edema with 1+ lower extremity edema  Imaging: DG CHEST PORT 1 VIEW Result Date: 02/25/2024 CLINICAL DATA:  [id number] Hypoxia [id number] EXAM: PORTABLE CHEST - 1 VIEW COMPARISON:  Multiple, most recently Feb 23, 2024 FINDINGS: Tracheostomy tube is well positioned terminating in the trachea at the thoracic inlet. Weighted feeding tube courses below the diaphragm with the distal tip not included in the field of view. Subsegmental atelectasis in the right mid lung. Hazy airspace opacities in the medial right lung base. Minimal retrocardiac airspace opacities. No pneumothorax or pleural effusion. No cardiomegaly. Aortic valve replacement. Right PICC terminates at the cavoatrial junction. Left IJ approach central venous catheter terminates in the downstream left brachiocephalic vein. Sternotomy wires. IMPRESSION: 1. Hazy airspace opacities in both lung bases, which may represent atelectasis or changes of aspiration. 2. Similar positioning of the support tubes and lines, as described above. Electronically Signed   By: Rance Burrows M.D.   On: 02/25/2024 16:09    Labs: BMET Recent Labs  Lab 02/24/24 0407 02/24/24 1119 02/25/24 0430 02/25/24 1619 02/26/24 0427 02/26/24 1606 02/27/24 0413  NA 139 138 134* 133* 133* 130* 131*  K 3.8 4.3 4.2 4.6 4.0 5.0 4.9  CL 105 104 100 100 98 96* 95*  CO2 24 25 25 26 23 24 23   GLUCOSE 212* 114* 184* 221* 199* 347* 202*  BUN 64* 63* 42* 45* 43* 50* 50*  CREATININE 1.00 1.09 0.94 0.93 0.93 1.19 1.47*  CALCIUM  8.2* 8.5* 8.5* 8.6* 8.8*  8.7* 9.5  PHOS 3.1 3.4 2.8 2.5 3.0 2.6 3.4   CBC Recent Labs  Lab 02/24/24 0407 02/25/24 0430 02/26/24 0427 02/27/24 0413  WBC 12.0* 13.4* 13.2* 16.2*  HGB 8.2* 8.5* 8.9* 10.6*  HCT 27.6* 28.8* 30.7* 37.2*  MCV 91.7 90.9 91.9 91.6  PLT 507* 489* 478* 537*    Medications:     sodium chloride    Intravenous Once   acetaminophen   650 mg Per Tube  Q6H   bethanechol   10 mg Per Tube TID   Chlorhexidine  Gluconate Cloth  6 each Topical Daily   clonazePAM   0.5 mg Per Tube QHS   clopidogrel   75 mg Per Tube Daily   feeding supplement (PROSource TF20)  60 mL Per Tube TID   Gerhardt's butt cream   Topical Daily   insulin  aspart  0-20 Units Subcutaneous Q4H   insulin  aspart  5 Units Subcutaneous Q4H   insulin  glargine-yfgn  20 Units Subcutaneous BID   midodrine   10 mg Per Tube TID WC   multivitamin  1 tablet Per Tube QHS   nutrition supplement (JUVEN)  1 packet Per Tube BID BM   mouth rinse  15 mL Mouth Rinse Q2H   oxyCODONE   5 mg Per Tube Q8H   pantoprazole  (PROTONIX ) IV  40 mg Intravenous QHS   rosuvastatin   40 mg Per Tube Daily   sodium chloride  flush  10-40 mL Intracatheter Q12H   [START ON 02/29/2024] thiamine   100 mg Per Tube Daily    Clevester Dally, MD 02/27/2024, 7:51 AM

## 2024-03-08 ENCOUNTER — Ambulatory Visit: Admitting: Surgery

## 2024-03-10 ENCOUNTER — Other Ambulatory Visit (HOSPITAL_COMMUNITY)

## 2024-03-19 NOTE — Death Summary Note (Signed)
 DEATH SUMMARY   Patient Details  Name: Dustin Davenport MRN: [id number] DOB: 1955-12-17  Admission/Discharge Information   Admit Date:  09-Feb-2024  Date of Death: Date of Death: Mar 07, 2024  Time of Death: Time of Death: 0249  Length of Stay: 22-Mar-2024  Referring Physician: Annette Barters, MD   Reason(s) for Hospitalization  {Trauma:[id number]::"***"}  Diagnoses  Preliminary cause of death:  Secondary Diagnoses (including complications and co-morbidities):  Principal Problem:   STEMI involving right coronary artery [facility]) Active Problems:   S/P aortic dissection repair   AKI (acute kidney injury) (HCC)   On mechanically assisted ventilation (HCC)   Acute respiratory failure with hypoxia (HCC)   Acute pulmonary edema (HCC)   Acute encephalopathy   Tracheostomy dependence [facility])   Brief Hospital Course (including significant findings, care, treatment, and services provided and events leading to death)  Dustin Davenport is a 68 y.o. year old male who ***    Pertinent Labs and Studies  Significant Diagnostic Studies DG CHEST PORT 1 VIEW Result Date: 02/27/2024 CLINICAL DATA:  CHF. EXAM: PORTABLE CHEST 1 VIEW COMPARISON:  02/25/2024 FINDINGS: Stable position of left IJ catheter, tracheostomy tube and feeding tube. Right arm PICC line is also unchanged in the interval. Status post median sternotomy. Cardiomediastinal contours are unchanged. Previous left pleural effusion has resolved in the interval. No interstitial edema. Mild residual subsegmental atelectasis noted in the left base. IMPRESSION: 1. Interval resolution of left pleural effusion. 2. Mild residual subsegmental atelectasis in the left base. Electronically Signed   By: Kimberley Penman M.D.   On: 02/27/2024 10:37   DG CHEST PORT 1 VIEW Result Date: 02/25/2024 CLINICAL DATA:  [id number] Hypoxia [id number] EXAM: PORTABLE CHEST - 1 VIEW COMPARISON:  Multiple, most recently Feb 23, 2024 FINDINGS: Tracheostomy tube is well positioned terminating in the  trachea at the thoracic inlet. Weighted feeding tube courses below the diaphragm with the distal tip not included in the field of view. Subsegmental atelectasis in the right mid lung. Hazy airspace opacities in the medial right lung base. Minimal retrocardiac airspace opacities. No pneumothorax or pleural effusion. No cardiomegaly. Aortic valve replacement. Right PICC terminates at the cavoatrial junction. Left IJ approach central venous catheter terminates in the downstream left brachiocephalic vein. Sternotomy wires. IMPRESSION: 1. Hazy airspace opacities in both lung bases, which may represent atelectasis or changes of aspiration. 2. Similar positioning of the support tubes and lines, as described above. Electronically Signed   By: Rance Burrows M.D.   On: 02/25/2024 16:09   EEG adult Result Date: 02/24/2024 Arleene Lack, MD     02/24/2024  4:34 PM [name] MRN: [id number] Epilepsy Attending: Arleene Lack Referring Physician/Provider: Brayton Calin, MD Date: 02/24/2024 Duration: 23.14 mins Patient history: 68yo M with ams. EEG to evaluate for seizure Level of alertness: Awake/ lethargic AEDs during EEG study: Clonazepam  Technical aspects: This EEG study was done with scalp electrodes positioned according to the 10-20 International system of electrode placement. Electrical activity was reviewed with band pass filter of 1-70Hz , sensitivity of 7 uV/mm, display speed of 10mm/sec with a 60Hz  notched filter applied as appropriate. EEG data were recorded continuously and digitally stored.  Video monitoring was available and reviewed as appropriate. Description: EEG showed continuous generalized predominantly   5 to 7 Hz theta slowing admixed with intermittent 2-3hz  delta slowing. Hyperventilation and photic stimulation were not performed.    ABNORMALITY - Continuous slow, generalized  IMPRESSION: This study is suggestive of moderate diffuse  encephalopathy. No seizures or epileptiform  discharges were seen throughout the recording.  Arleene Lack   DG CHEST PORT 1 VIEW Result Date: 02/23/2024 CLINICAL DATA:  [id number] Central venous catheter in place [id number] EXAM: PORTABLE CHEST 1 VIEW COMPARISON:  Radiograph 02/19/2024 FINDINGS: Introduction of a LEFT central venous line with tip in the mid SVC. No pneumothorax. Stable PICC line, tracheostomy tube and feeding tube. Midline sternotomy.  Small LEFT effusion. IMPRESSION: LEFT central venous line in good position. No pneumothorax. Electronically Signed   By: Deboraha Fallow M.D.   On: 02/23/2024 14:25   MR BRAIN WO CONTRAST Result Date: 02/22/2024 CLINICAL DATA:  Mental status change of unknown cause EXAM: MRI HEAD WITHOUT CONTRAST TECHNIQUE: Multiplanar, multiecho pulse sequences of the brain and surrounding structures were obtained without intravenous contrast. COMPARISON:  02/17/2024 FINDINGS: Brain: Diffusion imaging shows a subcentimeter focus of acute infarction in the deep white matter at the left frontal parietal junction. There are subacute infarctions present within the deep white matter of the left occipital lobe and temporal lobe. Few punctate foci also present right posterior temporal lobe. No large confluent infarction. No mass, hemorrhage, hydrocephalus or extra-axial collection. Findings could be due to embolic disease or global hypoperfusion. Vascular: Major vessels at the base of the brain show flow. Skull and upper cervical spine: Negative Sinuses/Orbits: Mucosal inflammatory changes of the maxillary and sphenoid sinuses. Orbits negative. Other: Extensive bilateral mastoid effusions, often seen with ventilator support. IMPRESSION: Subcentimeter focus of acute infarction in the deep white matter at the left frontal parietal junction. Subacute infarctions in the deep white matter of the left occipital lobe and temporal lobe. Few punctate foci also present in the right posterior temporal lobe. Findings could be due to  embolic disease or global hypoperfusion. Electronically Signed   By: Bettylou Brunner M.D.   On: 02/22/2024 14:36   ECHOCARDIOGRAM COMPLETE Result Date: 02/21/2024    ECHOCARDIOGRAM REPORT   Patient Name:   CHRISTORPHER WATLEY Mikula Date of Exam: 02/21/2024 Medical Rec #:  [id number]    Height:       73.0 in Accession #:    [id number]   Weight:       260.6 lb Date of Birth:  [birth year]    BSA:          2.408 m Patient Age:    67 years     BP:           117/54 mmHg Patient Gender: M            HR:           97 bpm. Exam Location:  Inpatient Procedure: 2D Echo, Cardiac Doppler, Color Doppler and Intracardiac            Opacification Agent (Both Spectral and Color Flow Doppler were            utilized during procedure). Indications:    CHF I50.21  History:        Patient has prior history of Echocardiogram examinations, most                 recent 02/05/2024. Acute MI. Aortic dissection repair.                 Aortic Valve: 27 mm bioprosthetic valve is present in the aortic                 position. Procedure Date: 02/01/24.  Sonographer:    Terrilee Few RCS Referring Phys: [id number] DALTON  S MCLEAN IMPRESSIONS  1. Left ventricular ejection fraction, by estimation, is 40%. The left ventricle has moderately decreased function. The left ventricle demonstrates global hypokinesis. There is mild concentric left ventricular hypertrophy. Left ventricular diastolic parameters are indeterminate.  2. Right ventricular systolic function is normal. The right ventricular size is mildly enlarged. There is mildly elevated pulmonary artery systolic pressure.  3. Left atrial size was mildly dilated.  4. Right atrial size was moderately dilated.  5. A small pericardial effusion is present. The pericardial effusion is circumferential.  6. The mitral valve is normal in structure. Trivial mitral valve regurgitation. No evidence of mitral stenosis.  7. The aortic valve has been repaired/replaced. Aortic valve regurgitation is not visualized. No aortic stenosis  is present. There is a 27 mm bioprosthetic valve present in the aortic position. Procedure Date: 02/01/24.  8. Aortic root/ascending aorta has been repaired/replaced.  9. The inferior vena cava is dilated in size with <50% respiratory variability, suggesting right atrial pressure of 15 mmHg. Comparison(s): Prior images reviewed side by side. LV function slightly improved compared to prior. FINDINGS  Left Ventricle: Left ventricular ejection fraction, by estimation, is 40%. The left ventricle has moderately decreased function. The left ventricle demonstrates global hypokinesis. Definity  contrast agent was given IV to delineate the left ventricular endocardial borders. The left ventricular internal cavity size was normal in size. There is mild concentric left ventricular hypertrophy. Left ventricular diastolic parameters are indeterminate. Right Ventricle: The right ventricular size is mildly enlarged. Right vetricular wall thickness was not well visualized. Right ventricular systolic function is normal. There is mildly elevated pulmonary artery systolic pressure. The tricuspid regurgitant  velocity is 2.60 m/s, and with an assumed right atrial pressure of 15 mmHg, the estimated right ventricular systolic pressure is 42.0 mmHg. Left Atrium: Left atrial size was mildly dilated. Right Atrium: Right atrial size was moderately dilated. Pericardium: A small pericardial effusion is present. The pericardial effusion is circumferential. Mitral Valve: The mitral valve is normal in structure. Trivial mitral valve regurgitation. No evidence of mitral valve stenosis. Tricuspid Valve: The tricuspid valve is normal in structure. Tricuspid valve regurgitation is trivial. No evidence of tricuspid stenosis. Aortic Valve: The aortic valve has been repaired/replaced. Aortic valve regurgitation is not visualized. No aortic stenosis is present. Aortic valve peak gradient measures 11.7 mmHg. There is a 27 mm bioprosthetic valve present in  the aortic position. Procedure Date: 02/01/24. Pulmonic Valve: The pulmonic valve was not well visualized. Pulmonic valve regurgitation is trivial. No evidence of pulmonic stenosis. Aorta: The aortic root/ascending aorta has been repaired/replaced. Venous: The inferior vena cava is dilated in size with less than 50% respiratory variability, suggesting right atrial pressure of 15 mmHg. IAS/Shunts: The atrial septum is grossly normal.  LEFT VENTRICLE PLAX 2D LVIDd:         3.90 cm   Diastology LVIDs:         3.30 cm   LV e' medial:    12.50 cm/s LV PW:         1.30 cm   LV E/e' medial:  7.9 LV IVS:        1.20 cm   LV e' lateral:   21.00 cm/s LVOT diam:     2.40 cm   LV E/e' lateral: 4.7 LV SV:         56 LV SV Index:   23 LVOT Area:     4.52 cm  RIGHT VENTRICLE  IVC RV S prime:     15.30 cm/s  IVC diam: 2.60 cm TAPSE (M-mode): 1.8 cm LEFT ATRIUM             Index        RIGHT ATRIUM           Index LA diam:        3.90 cm 1.62 cm/m   RA Area:     30.40 cm LA Vol (A2C):   70.5 ml 29.28 ml/m  RA Volume:   115.00 ml 47.76 ml/m LA Vol (A4C):   77.0 ml 31.98 ml/m LA Biplane Vol: 73.7 ml 30.61 ml/m  AORTIC VALVE AV Area (Vmax): 2.24 cm AV Vmax:        171.00 cm/s AV Peak Grad:   11.7 mmHg LVOT Vmax:      84.55 cm/s LVOT Vmean:     50.350 cm/s LVOT VTI:       0.124 m  AORTA Ao Root diam: 3.70 cm Ao Asc diam:  3.10 cm MITRAL VALVE               TRICUSPID VALVE MV Area (PHT): 4.21 cm    TR Peak grad:   27.0 mmHg MV Decel Time: 180 msec    TR Vmax:        260.00 cm/s MV E velocity: 99.10 cm/s MV A velocity: 49.80 cm/s  SHUNTS MV E/A ratio:  1.99        Systemic VTI:  0.12 m                            Systemic Diam: 2.40 cm Sheryle Donning MD Electronically signed by Sheryle Donning MD Signature Date/Time: 02/21/2024/9:15:26 PM    Final    VAS US  LOWER EXTREMITY VENOUS (DVT) Result Date: 02/21/2024  Lower Venous DVT Study Patient Name:  DOULGAS BUDNICK Drab  Date of Exam:   02/21/2024 Medical Rec #:  [id number]     Accession #:    [id number] Date of Birth: 1956-09-30     Patient Gender: M Patient Age:   73 years Exam Location:  [facility] Procedure:      VAS US  LOWER EXTREMITY VENOUS (DVT) Referring Phys: Felton Hough --------------------------------------------------------------------------------  Indications: Edema.  Risk Factors: DVT Hx. Comparison Study: Thrombus reductions seen since previous exam 02/14/24. Performing Technologist: Estanislao Heimlich  Examination Guidelines: A complete evaluation includes B-mode imaging, spectral Doppler, color Doppler, and power Doppler as needed of all accessible portions of each vessel. Bilateral testing is considered an integral part of a complete examination. Limited examinations for reoccurring indications may be performed as noted. The reflux portion of the exam is performed with the patient in reverse Trendelenburg.  +---------+---------------+---------+-----------+----------+--------------+ RIGHT    CompressibilityPhasicitySpontaneityPropertiesThrombus Aging +---------+---------------+---------+-----------+----------+--------------+ CFV      Full           Yes      Yes                                 +---------+---------------+---------+-----------+----------+--------------+ SFJ      Full                                                        +---------+---------------+---------+-----------+----------+--------------+  FV Prox  Full                                                        +---------+---------------+---------+-----------+----------+--------------+ FV Mid   Full                                                        +---------+---------------+---------+-----------+----------+--------------+ FV DistalFull                                                        +---------+---------------+---------+-----------+----------+--------------+ PFV      Full                                                         +---------+---------------+---------+-----------+----------+--------------+ POP      Full           Yes      Yes                                 +---------+---------------+---------+-----------+----------+--------------+ PTV      Full                    Yes                                 +---------+---------------+---------+-----------+----------+--------------+ PERO     Full                    Yes                                 +---------+---------------+---------+-----------+----------+--------------+   +---------+---------------+---------+-----------+----------+-----------------+ LEFT     CompressibilityPhasicitySpontaneityPropertiesThrombus Aging    +---------+---------------+---------+-----------+----------+-----------------+ CFV      Full           Yes      Yes                                    +---------+---------------+---------+-----------+----------+-----------------+ SFJ      Full                                                           +---------+---------------+---------+-----------+----------+-----------------+ FV Prox  Full                                                           +---------+---------------+---------+-----------+----------+-----------------+  FV Mid   Full                                                           +---------+---------------+---------+-----------+----------+-----------------+ FV Distal                        Yes                                    +---------+---------------+---------+-----------+----------+-----------------+ PFV      Full                                                           +---------+---------------+---------+-----------+----------+-----------------+ POP      Full           Yes      Yes                                    +---------+---------------+---------+-----------+----------+-----------------+ PTV      Full                    Yes                                     +---------+---------------+---------+-----------+----------+-----------------+ PERO     Full                    Yes                                    +---------+---------------+---------+-----------+----------+-----------------+ Gastroc  Partial        No       Yes                  Age Indeterminate +---------+---------------+---------+-----------+----------+-----------------+     Summary: RIGHT: - There is no evidence of deep vein thrombosis in the lower extremity.  - No cystic structure found in the popliteal fossa.  LEFT: Findings consistent with age indeterminate intramuscular thrombus involving the left gastrocnemius veins. - No cystic structure found in the popliteal fossa.  *See table(s) above for measurements and observations. Electronically signed by Angela Kell MD on 02/21/2024 at 4:03:55 PM.    Final    DG Chest Port 1 View Result Date: 02/19/2024 CLINICAL DATA:  Pneumonia. EXAM: PORTABLE CHEST 1 VIEW COMPARISON:  Radiograph yesterday.  CT 02/13/2024 FINDINGS: Tracheostomy tube tip remains at the thoracic inlet. Right internal jugular central line, right upper extremity PICC and weighted enteric tube remain in place. Post median sternotomy with stable cardiomegaly. Prosthetic aortic valve. Worsening retrocardiac opacity and possible effusion. Ill-defined opacity at the right lung base. No pneumothorax. IMPRESSION: 1. Worsening retrocardiac opacity and possible effusion. 2. Ill-defined opacity at the right lung base, may represent atelectasis or pneumonia. 3. Stable cardiomegaly. 4. Stable support apparatus. Electronically Signed   By:  Chadwick Colonel M.D.   On: 02/19/2024 10:11   DG Chest Port 1 View Result Date: 02/18/2024 CLINICAL DATA:  Status post tracheostomy. EXAM: PORTABLE CHEST 1 VIEW COMPARISON:  Feb 17, 2024. FINDINGS: Stable cardiomegaly. Tracheostomy tube is in grossly good position. Feeding tube is seen entering stomach. Status post aortic valve repair. Lungs are  clear. Bony thorax is unremarkable. Right internal jugular catheter is unchanged. IMPRESSION: Tracheostomy tube in grossly good position. Otherwise stable support apparatus. Electronically Signed   By: Rosalene Colon M.D.   On: 02/18/2024 12:48   DG CHEST PORT 1 VIEW Result Date: 02/17/2024 CLINICAL DATA:  [id number] Encounter for central line placement [id number]. EXAM: PORTABLE CHEST 1 VIEW COMPARISON:  Earlier the same day at 5:40 a.m. FINDINGS: Low lung volume. Re-demonstration of left retrocardiac airspace opacity obscuring the left hemidiaphragm, descending thoracic aorta and blunting the left lateral costophrenic angle, suggesting combination of left lung atelectasis and/or consolidation with pleural effusion. No significant interval change. Bilateral lung fields are otherwise clear. Right lateral costophrenic angle is clear. No pneumothorax on either side. Stable cardio-mediastinal silhouette. Prosthetic aortic valve noted. Sternotomy wires noted. No acute osseous abnormalities. The soft tissues are within normal limits. *Endotracheal tube tip at the level of clavicular heads is unchanged. *Right-sided PICC line with its tip overlying the cavoatrial junction region, unchanged. *Enteric tube is seen coursing below the left hemidiaphragm. The tip and side hole are not included in the image. *Interval placement of right IJ central venous catheter with its tip overlying the upper portion of superior vena cava. IMPRESSION: 1. Interval placement of right IJ central venous catheter with its tip overlying the upper portion of superior vena cava. No pneumothorax. 2. Otherwise stable exam. Electronically Signed   By: Beula Brunswick M.D.   On: 02/17/2024 15:11   CT HEAD WO CONTRAST ( ) Result Date: 02/17/2024 CLINICAL DATA:  68 year old male with encephalopathy. EXAM: CT HEAD WITHOUT CONTRAST TECHNIQUE: Contiguous axial images were obtained from the base of the skull through the vertex without intravenous contrast.  RADIATION DOSE REDUCTION: This exam was performed according to the departmental dose-optimization program which includes automated exposure control, adjustment of the mA and/or kV according to patient size and/or use of iterative reconstruction technique. COMPARISON:  None Available. FINDINGS: Brain: Cerebral volume is within normal limits for age. No midline shift, ventriculomegaly, mass effect, evidence of mass lesion, intracranial hemorrhage or evidence of cortically based acute infarction. Gray-white matter differentiation is within normal limits throughout the brain. Vascular: No suspicious intracranial vascular hyperdensity. Mild Calcified atherosclerosis at the skull base. Skull: Intact.  No acute osseous abnormality identified. Sinuses/Orbits: Intubated. Left nasoenteric tube in place with an appropriate visible cores. Fluid levels and opacification in the sphenoid, left maxillary sinuses. Partially opacified middle ears and mastoids bilaterally. Other: Intubated with fluid in the visible pharynx. No acute orbit or scalp soft tissue finding identified. IMPRESSION: 1. Normal for age noncontrast CT appearance of the Brain. 2. Intubated. Electronically Signed   By: Marlise Simpers M.D.   On: 02/17/2024 14:12   DG CHEST PORT 1 VIEW Result Date: 02/17/2024 CLINICAL DATA:  Hypoxia EXAM: PORTABLE CHEST 1 VIEW COMPARISON:  02/13/2024 FINDINGS: Right PICC, endotracheal tube and feeding catheter are again noted and stable. Cardiac shadow is enlarged but stable. Postsurgical changes are again noted. Persistent left retrocardiac opacity is seen. Small left effusion is noted. No bony abnormality is noted. IMPRESSION: Stable left lower lobe foot opacity with small effusion. Electronically Signed   By: Lavonia Powers  Lukens M.D.   On: 02/17/2024 09:24   VAS US  UPPER EXTREMITY VENOUS DUPLEX Result Date: 02/15/2024 UPPER VENOUS STUDY  Patient Name:  JURELL KNICELY Agent  Date of Exam:   02/14/2024 Medical Rec #: [id number]     Accession #:     [id number] Date of Birth: 16-Feb-1956     Patient Gender: M Patient Age:   82 years Exam Location:  [facility] Procedure:      VAS US  UPPER EXTREMITY VENOUS DUPLEX Referring Phys: Luba Running SIMMONS --------------------------------------------------------------------------------  Indications: Swelling Risk Factors: Immobility Cancer Repair of AAA 02/01/24 DVT Known obesity. Limitations: Patient positioning. Comparison Study: None. Performing Technologist: Estanislao Heimlich  Examination Guidelines: A complete evaluation includes B-mode imaging, spectral Doppler, color Doppler, and power Doppler as needed of all accessible portions of each vessel. Bilateral testing is considered an integral part of a complete examination. Limited examinations for reoccurring indications may be performed as noted.  Right Findings: +----------+------------+---------+-----------+----------+-------+ RIGHT     CompressiblePhasicitySpontaneousPropertiesSummary +----------+------------+---------+-----------+----------+-------+ Subclavian    Full       Yes       Yes                      +----------+------------+---------+-----------+----------+-------+  Left Findings: +----------+------------+---------+-----------+------------------+-------+ LEFT      CompressiblePhasicitySpontaneous    Properties    Summary +----------+------------+---------+-----------+------------------+-------+ IJV           Full       Yes       Yes                              +----------+------------+---------+-----------+------------------+-------+ Subclavian    Full       Yes       Yes                              +----------+------------+---------+-----------+------------------+-------+ Axillary      Full       Yes       Yes                              +----------+------------+---------+-----------+------------------+-------+ Brachial      Full       Yes       Yes                               +----------+------------+---------+-----------+------------------+-------+ Radial        Full       Yes       Yes                              +----------+------------+---------+-----------+------------------+-------+ Ulnar         Full       Yes       Yes                              +----------+------------+---------+-----------+------------------+-------+ Cephalic    Partial      Yes       Yes    brightly echogenic Acute  +----------+------------+---------+-----------+------------------+-------+ Basilic       Full                 Yes                              +----------+------------+---------+-----------+------------------+-------+  Acute, partial, SVT seen in the upper extremity CephV.  Summary:  Right: No evidence of thrombosis in the subclavian.  Left: No evidence of deep vein thrombosis in the upper extremity. Findings consistent with acute superficial vein thrombosis involving the left cephalic vein.  *See table(s) above for measurements and observations.  Diagnosing physician: Angela Kell MD Electronically signed by Angela Kell MD on 02/15/2024 at 11:06:46 AM.    Final    ECHO TEE Result Date: 02/14/2024    TRANSESOPHOGEAL ECHO REPORT   Patient Name:   OBRA BURCKHARD Hamor Date of Exam: 02/01/2024 Medical Rec #:  [id number]    Height:       73.0 in Accession #:    [id number]   Weight:       265.0 lb Date of Birth:  [birth year]    BSA:          2.425 m Patient Age:    67 years     BP:           127/37 mmHg Patient Gender: M            HR:           75 bpm. Exam Location:  Inpatient Procedure: Transesophageal Echo and Color Doppler (Both Spectral and Color Flow            Doppler were utilized during procedure). Indications:     I35.1 Nonrheumatic aortic (valve) insufficiency. Aortic                  dissection.  History:         Patient has prior history of Echocardiogram examinations, most                  recent 02/01/2024. CAD and Acute MI; Signs/Symptoms:Chest Pain.  Sonographer:      Raynelle Callow RDCS Referring Phys:  [id number] [facility] J PATWARDHAN Diagnosing Phys: Alwin Baars PROCEDURE: After discussion of the risks and benefits of a TEE, an informed consent was obtained from the patient. The transesophogeal probe was passed without difficulty through the esophogus of the patient. Imaged were obtained with the patient in a supine position. Sedation performed by performing physician. Patients was under conscious sedation during this procedure. The patient's vital signs; including heart rate, blood pressure, and oxygen saturation; remained stable throughout the procedure. The patient developed no complications during the procedure. Transgastric views not obtained.  IMPRESSIONS  1. Left ventricular ejection fraction, by estimation, is 45 to 50%. The left ventricle has low normal function.  2. Right ventricular systolic function was not well visualized. The right ventricular size is normal.  3. 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 The dissection appears to involve the RCC and extends distally to at least the proximal ascending aortic arch.  4. 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  5. No left atrial/left atrial appendage thrombus was detected. FINDINGS  Left Ventricle: Left ventricular ejection fraction, by estimation, is 45 to 50%. The left ventricle has low normal function. The left ventricular internal cavity size was normal in size. Right Ventricle: The right ventricular size is normal. No increase in right ventricular wall thickness. Right ventricular systolic function was not well visualized. Left Atrium: Left atrial size was normal in size. No left atrial/left atrial appendage thrombus was detected. Right Atrium: Right atrial size was normal in size. Pericardium:  There is no evidence of pericardial effusion. Mitral Valve: The  mitral valve is normal in structure. Mild mitral valve regurgitation. Tricuspid Valve: The tricuspid valve is normal in structure. Tricuspid valve regurgitation is mild. Aortic Valve: The aortic valve is abnormal. Aortic valve regurgitation is severe. Pulmonic Valve: The pulmonic valve was not assessed. Pulmonic valve regurgitation is trivial. Aorta: Aortic dilatation noted. There is dilatation of the ascending aorta and of the aortic root, measuring 53 mm. IAS/Shunts: No atrial level shunt detected by color flow Doppler.   AORTA Ao Asc diam: 5.30 cm Aditya Sabharwal Electronically signed by Alwin Baars Signature Date/Time: 02/14/2024/3:33:51 PM    Final    VAS US  LOWER EXTREMITY VENOUS (DVT) Result Date: 02/14/2024  Lower Venous DVT Study Patient Name:  HANIF YANNUZZI  Date of Exam:   02/14/2024 Medical Rec #: [id number]     Accession #:    [id number] Date of Birth: 20-Aug-1956     Patient Gender: M Patient Age:   24 years Exam Location:  [facility] Procedure:      VAS US  LOWER EXTREMITY VENOUS (DVT) Referring Phys: Luba Running SIMMONS --------------------------------------------------------------------------------  Indications: Edema.  Risk Factors: Immobility obesity. Comparison Study: None. Performing Technologist: Estanislao Heimlich  Examination Guidelines: A complete evaluation includes B-mode imaging, spectral Doppler, color Doppler, and power Doppler as needed of all accessible portions of each vessel. Bilateral testing is considered an integral part of a complete examination. Limited examinations for reoccurring indications may be performed as noted. The reflux portion of the exam is performed with the patient in reverse Trendelenburg.  +-----+---------------+---------+-----------+----------+--------------+ RIGHTCompressibilityPhasicitySpontaneityPropertiesThrombus Aging +-----+---------------+---------+-----------+----------+--------------+ CFV  Full           Yes      Yes                                  +-----+---------------+---------+-----------+----------+--------------+   +---------+---------------+---------+-----------+----------+-------------------+ LEFT     CompressibilityPhasicitySpontaneityPropertiesThrombus Aging      +---------+---------------+---------+-----------+----------+-------------------+ CFV      Full           Yes      Yes                                      +---------+---------------+---------+-----------+----------+-------------------+ SFJ      Full                                                             +---------+---------------+---------+-----------+----------+-------------------+ FV Prox  Full                                                             +---------+---------------+---------+-----------+----------+-------------------+ FV Mid   Full                                                             +---------+---------------+---------+-----------+----------+-------------------+  FV DistalFull                    Yes                  Not well visualized +---------+---------------+---------+-----------+----------+-------------------+ PFV      Full                                                             +---------+---------------+---------+-----------+----------+-------------------+ POP      Full           Yes      Yes                                      +---------+---------------+---------+-----------+----------+-------------------+ PTV      Full                    Yes                                      +---------+---------------+---------+-----------+----------+-------------------+ PERO                             Yes                  Not well visualized +---------+---------------+---------+-----------+----------+-------------------+ Gastroc  None           No       No         dilated   Acute                +---------+---------------+---------+-----------+----------+-------------------+    Summary: RIGHT: - No evidence of common femoral vein obstruction.   LEFT: Findings consistent with acute intramuscular thrombosis involving the left gastrocnemius veins.  *See table(s) above for measurements and observations. Electronically signed by Irvin Mantel on 02/14/2024 at 2:27:07 PM.    Final    CT CHEST ABDOMEN PELVIS W CONTRAST Addendum Date: 02/14/2024 ADDENDUM REPORT: 02/14/2024 13:06 ADDENDUM: Addendum is made to note the presence of a posterior left first rib fracture not appreciated on initial review, which was most likely incurred at the time of sternotomy (series 5, image 30). Electronically Signed   By: Fredricka Jenny M.D.   On: 02/14/2024 13:06   Result Date: 02/14/2024 CLINICAL DATA:  Sepsis status post aortic root repair EXAM: CT CHEST, ABDOMEN, AND PELVIS WITH CONTRAST TECHNIQUE: Multidetector CT imaging of the chest, abdomen and pelvis was performed following the standard protocol during bolus administration of intravenous contrast. RADIATION DOSE REDUCTION: This exam was performed according to the departmental dose-optimization program which includes automated exposure control, adjustment of the mA and/or kV according to patient size and/or use of iterative reconstruction technique. 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 Substantial volume of intermediate attenuation fluid about the aortic root and graft (series 3, image 35). Mild cardiomegaly. Left and right coronary artery calcifications and stents. Small pericardial effusion. Right upper extremity PICC. Descending thoracic aortic dissection as seen by prior  CT angiogram, with diminished size and conspicuity of the false lumen (series 3, image 36). Mediastinum/Nodes: No enlarged  mediastinal, hilar, or axillary lymph nodes. Thyroid gland, trachea, and esophagus demonstrate no significant findings. Lungs/Pleura: Endotracheal intubation. Small bilateral pleural effusions and associated atelectasis or consolidation, with scattered heterogeneous airspace opacities in the remaining aerated portions of the lungs (series 5, image 102). Musculoskeletal: Status post interval median sternotomy. No significant fluid collection within the sternotomy wound. No acute osseous findings. CT ABDOMEN PELVIS FINDINGS Hepatobiliary: Hypodensity of the liver parenchyma adjacent to the gallbladder fossa, probably focal fatty deposition (series 3, image 73). No gallstones, gallbladder wall thickening, or biliary dilatation. Pancreas: Unremarkable. No pancreatic ductal dilatation or surrounding inflammatory changes. Spleen: Normal in size without significant abnormality. Adrenals/Urinary Tract: Adrenal glands are unremarkable. Hypodensity of the posterior left renal cortex, possibly exaggerated by streak artifact from adjacent left upper extremity (series 3, image 74). Nonobstructive left nephrolithiasis. No hydronephrosis. Simple, benign renal cortical cysts, for which no further follow-up or characterization is required. Foley catheter in the bladder. Stomach/Bowel: Stomach is within normal limits. Appendix appears normal. No evidence of bowel wall thickening, distention, or inflammatory changes. Sigmoid diverticulosis Vascular/Lymphatic: Aortic atherosclerosis. Redemonstrated left eccentric aortic dissection flap contiguous with the descending thoracic aorta, the false lumen diminished in size and conspicuity compared to prior examination. No enlarged abdominal or pelvic lymph nodes. Reproductive: No mass or other abnormality. Other: No abdominal wall hernia.  Anasarca.  No ascites. Musculoskeletal: No acute osseous findings. IMPRESSION: 1. Status post interval Bentall type aortic root graft repair. Substantial  volume of intermediate attenuation fluid about the aortic root and graft, as well as a small pericardial effusion, consistent with postoperative hematoma/seroma. The presence or absence of infection within this fluid is not established by imaging. No significant fluid collection within the sternotomy wound. 2. 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 3. Small bilateral pleural effusions and associated atelectasis or consolidation, with scattered heterogeneous airspace opacities in the remaining aerated portions of the lungs, consistent with nonspecific infection or aspiration. 4. Hypodensity of the posterior left renal cortex, possibly exaggerated by streak artifact from adjacent left upper extremity. Correlate with urinalysis to exclude pyelonephritis. 5. Coronary artery disease. 6. Anasarca. Aortic Atherosclerosis (ICD10-I70.0). Electronically Signed: By: Fredricka Jenny M.D. On: 02/13/2024 10:38   DG CHEST PORT 1 VIEW Result Date: 02/13/2024 CLINICAL DATA:  Congestion of respiratory tract. Status post STEMI involving the right coronary artery. EXAM: PORTABLE CHEST 1 VIEW COMPARISON:  02/10/2024 FINDINGS: Right arm PICC line tip is in the superior cavoatrial junction. ETT tip is unchanged above the level of the carina. There is a feeding tube which courses below the GE junctions. Previous median sternotomy. Progressive veil like opacification over both lower lungs. Persistent retrocardiac opacification in the left base. IMPRESSION: 1. Progressive veil like opacification over both lower lungs compatible with pleural effusions. 2. Persistent retrocardiac opacification in the left base compatible with atelectasis Electronically Signed   By: Kimberley Penman M.D.   On: 02/13/2024 07:43   CARDIAC CATHETERIZATION Result Date: 02/11/2024 HEMODYNAMICS: RA:       12 mmHg (mean) RV:       36/4, 12 mmHg PA:        36/13 mmHg (24 mean) PCWP: 13 mmHg (mean)    Estimated Fick CO/CI --> Inaccurate given 100% FiO2 Thermodilution CO/CI   6.67L/min, 2.76L/min/m2    TPG  11  mmHg     PVR  1.67 Wood Units PAPi  1.9  IMPRESSION: Mildly elevated right and normal left sided filling pressures while on of PEEP Normal cardiac output and index by TD. Mildly elevated pulmonary artery pressure. RECOMMENDATIONS: Failure to wean ventilation not explained by elevated PCWP. Continue gentle diuresis and continued treatment of PNA.   DG CHEST PORT 1 VIEW Result Date: 02/10/2024 CLINICAL DATA:  Status post Bentall procedure EXAM: PORTABLE CHEST 1 VIEW COMPARISON:  02/08/2024 FINDINGS: Endotracheal tube, gastric catheter and right PICC are noted in satisfactory position. Cardiac shadow is enlarged but stable. Postsurgical changes are again seen. Elevation the right hemidiaphragm is noted. Stable density is noted in the left retrocardiac region. No bony abnormality is noted. IMPRESSION: Postsurgical changes with tubes and lines as described above. Stable left retrocardiac density. Electronically Signed   By: Violeta Grey M.D.   On: 02/10/2024 09:34   DG CHEST PORT 1 VIEW Result Date: 02/08/2024 CLINICAL DATA:  Status post intubation EXAM: PORTABLE CHEST 1 VIEW COMPARISON:  02/07/2024 FINDINGS: Endotracheal tube and gastric catheter are again noted in satisfactory position. Postsurgical changes are again noted. Cardiac shadow is enlarged but stable. Lungs are well aerated with left retrocardiac opacity. Small effusions are noted. IMPRESSION: Stable appearance of the chest when compared with the previous day. Electronically Signed   By: Violeta Grey M.D.   On: 02/08/2024 10:30   US  EKG SITE RITE Result Date: 02/07/2024 If Site Rite image not attached, placement could not be confirmed due to current cardiac rhythm.  DG Chest Port 1 View Result Date: 02/07/2024 CLINICAL DATA:  Aortic dissection EXAM: PORTABLE CHEST 1 VIEW COMPARISON:   02/06/2024 FINDINGS: Single frontal view of the chest demonstrates endotracheal tube overlying tracheal air column tip at level of thoracic inlet. Enteric catheter passes below diaphragm tip excluded by collimation. Flow directed right internal jugular central venous catheter tip overlies main pulmonary outflow tract. Postsurgical changes from median sternotomy and aortic valve replacement. Stable enlargement of the cardiomediastinal silhouette. Low lung volumes, with persistent bibasilar opacities compatible with consolidation and/or effusions. No pneumothorax. IMPRESSION: 1. Support devices as above. 2. Low lung volumes, with stable bibasilar consolidation and/or effusions. Electronically Signed   By: Bobbye Burrow M.D.   On: 02/07/2024 10:21   DG Chest Port 1 View Result Date: 02/06/2024 CLINICAL DATA:  Shortness of breath. EXAM: PORTABLE CHEST 1 VIEW COMPARISON:  02/05/2024 FINDINGS: ETT tip is stable above the carina. Enteric tube courses below the GE junction. Pulmonary arterial catheter is in the proximal right main pulmonary artery. Mediastinal drain is unchanged in position. Status post aortic valve replacement. Unchanged asymmetric elevation of the right hemidiaphragm. Persistent small pleural effusions and bibasilar atelectasis/consolidation. No new findings. IMPRESSION: 1. Stable support apparatus. 2. Persistent small pleural effusions and bibasilar atelectasis/consolidation. 3. No new findings. Electronically Signed   By: Kimberley Penman M.D.   On: 02/06/2024 08:08   DG Abd 1 View Result Date: 02/05/2024 CLINICAL DATA:  OG tube EXAM: ABDOMEN - 1 VIEW COMPARISON:  None Available. FINDINGS: Enteric tube tip and side port overlie the body of the stomach. Metallic and wirelike densities over the central abdomen. Drainage catheters over the lower chest/epigastric area IMPRESSION: Enteric tube tip and side port overlie the body of the stomach. Electronically Signed   By: Esmeralda Hedge M.D.   On:  02/05/2024 19:03   DG Chest Port 1 View Result Date: 02/05/2024 CLINICAL DATA:  Intubated EXAM: PORTABLE CHEST 1 VIEW COMPARISON:  02/05/2024, 02/04/2024, 02/03/2024 FINDINGS: Endotracheal tube tip is about 2.4 cm superior to the carina. Enteric tube tip below the diaphragm but incompletely assessed. Right IJ Swan-Ganz catheter tip over the right pulmonary artery. Valve prosthesis. Cardiomegaly with similar bilateral effusions and bibasilar consolidations. Slight decreased vascular congestion. No definitive pneumothorax. IMPRESSION: 1. Cardiomegaly with similar bilateral effusions and bibasilar consolidations. Slight decreased vascular congestion. 2. Re-intubation with tip of endotracheal tube about 2.4 cm superior to carina. Enteric tube tip below the diaphragm but incompletely assessed Electronically Signed   By: Esmeralda Hedge M.D.   On: 02/05/2024 19:02   ECHOCARDIOGRAM LIMITED Result Date: 02/05/2024    ECHOCARDIOGRAM LIMITED REPORT   Patient Name:   RYMAN STFLEUR Sellinger Date of Exam: 02/05/2024 Medical Rec #:  [id number]    Height:       73.0 in Accession #:    [id number]   Weight:       286.6 lb Date of Birth:  14-Jun-1956    BSA:          2.507 m Patient Age:    67 years     BP:           107/62 mmHg Patient Gender: M            HR:           90 bpm. Exam Location:  Inpatient Procedure: Limited Echo, Cardiac Doppler and Color Doppler (Both Spectral and            Color Flow Doppler were utilized during procedure). STAT ECHO Indications:     shock  History:         Patient has prior history of Echocardiogram examinations, most                  recent 02/03/2024. CAD, Bentall. aortic dissection repair; Risk                  Factors:Hypertension.  Sonographer:     Dione Franks RDCS Referring Phys:  [id number] Tino Foreman SMITH Diagnosing Phys: Lucetta Russel Mallipeddi IMPRESSIONS  1. Limited Echo to evaluate LV and RV function.  2. Challenging study. No LV thrombus by Definity . Left ventricular ejection fraction, by  estimation, is 35%. The left ventricle has moderately reduced function. Global hypokinesis. Left ventricular diastolic function could not be evaluated.  3. Right ventricular systolic function is moderately reduced in the RV free wall. The right ventricular size is moderately enlarged. FINDINGS  Left Ventricle: No LV thrombus by Definity . Left ventricular ejection fraction, by estimation, is 35%. The left ventricle has severely decreased function. The left ventricle has no regional wall motion abnormalities. The left ventricular internal cavity  size was normal in size. There is no left ventricular hypertrophy. Left ventricular diastolic function could not be evaluated. Right Ventricle: The right ventricular size is moderately enlarged. No increase in right ventricular wall thickness. Right ventricular systolic function is moderately reduced. Left Atrium: Left atrial size was not assessed. Right Atrium: Right atrial size was normal in size. Pericardium: There is no evidence of pericardial effusion. Mitral Valve: The mitral valve was not well visualized. Trivial mitral valve regurgitation. Tricuspid Valve: The tricuspid valve is not well visualized. Tricuspid valve regurgitation is trivial. Aortic Valve: The aortic valve was not well visualized. Aortic valve regurgitation is not visualized. No aortic stenosis is present. Aortic valve mean gradient measures 7.2 mmHg. Aortic valve peak gradient measures 13.1 mmHg. Pulmonic Valve: The pulmonic valve was not well visualized. Aorta: Aortic  root could not be assessed. Venous: The inferior vena cava was not well visualized. IAS/Shunts: The interatrial septum was not well visualized. Additional Comments: Spectral Doppler performed. Color Doppler performed.   3D Volume EF: 3D EF:        35 % AORTIC VALVE AV Vmax:           181.00 cm/s AV Vmean:          120.750 cm/s AV VTI:            0.275 m AV Peak Grad:      13.1 mmHg AV Mean Grad:      7.2 mmHg LVOT Vmax:         108.65  cm/s LVOT Vmean:        69.550 cm/s LVOT VTI:          0.159 m LVOT/AV VTI ratio: 0.58 TRICUSPID VALVE TR Peak grad:   19.4 mmHg TR Vmax:        220.00 cm/s  SHUNTS Systemic VTI: 0.16 m Vishnu Priya Mallipeddi Electronically signed by Lucetta Russel Mallipeddi Signature Date/Time: 02/05/2024/11:29:50 AM    Final (Updated)    DG CHEST PORT 1 VIEW Result Date: 02/05/2024 CLINICAL DATA:  Status post aortic valve replacement EXAM: PORTABLE CHEST 1 VIEW COMPARISON:  02/04/2024 FINDINGS: There is been interval removal of the endotracheal tube and enteric tube. Stable mediastinal drain. No pneumothorax identified. The right IJ pulmonary arterial catheter is stable with tip in the right main pulmonary artery. Stable cardiomediastinal contours. Persistent bilateral pleural effusions with progressive veil like opacification over bilateral lower lung zones. IMPRESSION: 1. Interval removal of endotracheal tube and enteric tube. 2. Persistent bilateral pleural effusions with progressive veil like opacification over bilateral lower lung zones. Electronically Signed   By: Kimberley Penman M.D.   On: 02/05/2024 07:23   DG CHEST PORT 1 VIEW Result Date: 02/04/2024 CLINICAL DATA:  Status post aortic valve replacement. EXAM: PORTABLE CHEST 1 VIEW COMPARISON:  02/03/2024 FINDINGS: Endotracheal tube tip again noted at the level of the sternal notch proximally 9.2 cm above the base of the carina. Feeding tube tip is positioned in the region of the esophagogastric junction with proximal side port in the distal esophagus right IJ pulmonary artery catheter tip is in the right main pulmonary artery similar left base collapse/consolidation with probable small left pleural effusion. Mediastinal/pericardial drains again noted. No discernible pneumothorax. IMPRESSION: 1. Endotracheal tube tip again noted at the level of the sternal notch proximally 9.2 cm above the base of the carina. 2. Feeding tube tip is positioned in the region of the  esophagogastric junction with proximal side port in the distal esophagus. Advancement recommended. 3. Similar left base collapse/consolidation with probable small left pleural effusion. Electronically Signed   By: Donnal Fusi M.D.   On: 02/04/2024 10:23   ECHOCARDIOGRAM COMPLETE Result Date: 02/03/2024    ECHOCARDIOGRAM REPORT   Patient Name:   TOBENNA COLLE Kina Date of Exam: 02/03/2024 Medical Rec #:  [id number]    Height:       73.0 in Accession #:    [id number]   Weight:       297.4 lb Date of Birth:  12/24/1955    BSA:          2.547 m Patient Age:    67 years     BP:           102/49 mmHg Patient Gender: M  HR:           89 bpm. Exam Location:  Inpatient Procedure: 2D Echo, Cardiac Doppler, Color Doppler and Intracardiac            Opacification Agent (Both Spectral and Color Flow Doppler were            utilized during procedure). Indications:    Dissection of thoracic aorta I71.01  History:        Patient has prior history of Echocardiogram examinations, most                 recent 02/01/2024. Aneurysm of the Ascending Aorta 52cm.  Sonographer:    Terrilee Few RCS Sonographer#2:  Raynelle Callow RDCS Referring Phys: [id number] ALMA L DIAZ IMPRESSIONS  1. Left ventricular ejection fraction, by estimation, is 20 to 25%. The left ventricle has severely decreased function. The left ventricle demonstrates global hypokinesis. Left ventricular diastolic parameters are indeterminate.  2. Right ventricular systolic function is moderately reduced. The right ventricular size is moderately enlarged.  3. The mitral valve is normal in structure. Trivial mitral valve regurgitation. No evidence of mitral stenosis.  4. The aortic valve has been repaired/replaced. Aortic valve regurgitation is not visualized. No aortic stenosis is present.  5. The inferior vena cava is dilated in size with <50% respiratory variability, suggesting right atrial pressure of 15 mmHg. Conclusion(s)/Recommendation(s): Technically very limitesd  study due to poor sound wave transmission. FINDINGS  Left Ventricle: Left ventricular ejection fraction, by estimation, is 20 to 25%. The left ventricle has severely decreased function. The left ventricle demonstrates global hypokinesis. Definity  contrast agent was given IV to delineate the left ventricular endocardial borders. The left ventricular internal cavity size was normal in size. There is no left ventricular hypertrophy. Left ventricular diastolic parameters are indeterminate. Right Ventricle: The right ventricular size is moderately enlarged. No increase in right ventricular wall thickness. Right ventricular systolic function is moderately reduced. Left Atrium: Left atrial size was normal in size. Right Atrium: Right atrial size was normal in size. Pericardium: Trivial pericardial effusion is present. The pericardial effusion is lateral to the left ventricle. Mitral Valve: The mitral valve is normal in structure. Trivial mitral valve regurgitation. No evidence of mitral valve stenosis. Tricuspid Valve: The tricuspid valve is not well visualized. Tricuspid valve regurgitation is not demonstrated. No evidence of tricuspid stenosis. Aortic Valve: The aortic valve has been repaired/replaced. Aortic valve regurgitation is not visualized. No aortic stenosis is present. Aortic valve mean gradient measures 3.0 mmHg. Aortic valve peak gradient measures 5.4 mmHg. Pulmonic Valve: The pulmonic valve was normal in structure. Pulmonic valve regurgitation is not visualized. No evidence of pulmonic stenosis. Aorta: The aortic root is normal in size and structure. Venous: The inferior vena cava is dilated in size with less than 50% respiratory variability, suggesting right atrial pressure of 15 mmHg. IAS/Shunts: No atrial level shunt detected by color flow Doppler.   LV Volumes (MOD) LV vol d, MOD A4C: 181.0 ml Diastology LV vol s, MOD A4C: 88.8 ml  LV e' medial:    8.27 cm/s LV SV MOD A4C:     181.0 ml LV E/e' medial:   6.8                             LV e' lateral:   9.90 cm/s  LV E/e' lateral: 5.7  RIGHT VENTRICLE         IVC TAPSE (M-mode): 1.6 cm  IVC diam: 2.20 cm LEFT ATRIUM             Index        RIGHT ATRIUM           Index LA Vol (A2C):   20.2 ml 7.93 ml/m   RA Area:     20.80 cm LA Vol (A4C):   43.7 ml 17.16 ml/m  RA Volume:   54.30 ml  21.32 ml/m LA Biplane Vol: 30.0 ml 11.78 ml/m  AORTIC VALVE AV Vmax:           116.00 cm/s AV Vmean:          78.800 cm/s AV VTI:            0.202 m AV Peak Grad:      5.4 mmHg AV Mean Grad:      3.0 mmHg LVOT Vmax:         99.70 cm/s LVOT Vmean:        65.300 cm/s LVOT VTI:          0.163 m LVOT/AV VTI ratio: 0.81 MITRAL VALVE MV Area (PHT): 4.21 cm    SHUNTS MV Decel Time: 180 msec    Systemic VTI: 0.16 m MV E velocity: 56.60 cm/s MV A velocity: 65.00 cm/s MV E/A ratio:  0.87 Jules Oar MD Electronically signed by Jules Oar MD Signature Date/Time: 02/03/2024/1:59:24 PM    Final    DG Chest Port 1 View Result Date: 02/03/2024 CLINICAL DATA:  Status post aortic dissection repair. EXAM: PORTABLE CHEST 1 VIEW COMPARISON:  02/02/2024 FINDINGS: Cardiopericardial silhouette is enlarged but stable. Similar appearance of mediastinal widening. Endotracheal tube tip projects above the level of the sternal notch, approximally 11.3 cm above the base of the carina. The NG tube passes into the stomach although the distal tip position is not included on the film. Midline mediastinal/pericardial drain evident. A second left lower thoracic drain seen previously has either been removed or not included on the film. Right IJ pulmonary artery catheter tip is in the right main pulmonary artery. Retrocardiac atelectasis with small left pleural effusion evident. Right lung clear. Telemetry leads overlie the chest. IMPRESSION: 1. Endotracheal tube tip projects above the level of the sternal notch, approximally 11.3 cm above the base of the carina. Advancement  recommended. 2. Retrocardiac atelectasis with small left pleural effusion. These results will be called to the ordering clinician or representative by the Radiologist Assistant, and communication documented in the PACS or [employer]. Electronically Signed   By: Donnal Fusi M.D.   On: 02/03/2024 09:29   DG Chest Port 1 View Result Date: 02/02/2024 CLINICAL DATA:  [id number].  Status post aortic dissection repair. EXAM: PORTABLE CHEST 1 VIEW COMPARISON:  CTA chest, abdomen and pelvis yesterday at 4:10 p.m. Is FINDINGS: 6:45 a.m. NGT tip extends just below the hiatus with the side hole in the distal esophagus and needs to be advanced into the stomach. ETT is in place with tip 5.3 cm from the carina. Right IJ Swan-Ganz catheter has its tip in the proximal right pulmonary artery. There is a least 1 mediastinal drain. There is a horizontal drain at the lower edge of the film which is probably in the pericardium. The mediastinal widening is not significantly changed. There is aortic atherosclerosis and dilatation. There is a new AVR in place, intact sternotomy sutures. There  are low lung volumes. Stable mild cardiomegaly. Normal caliber central vessels. Left lower lobe opacity is seen and could be atelectasis or consolidation. Mild elevation noted right hemidiaphragm. The remaining lungs are clear. No new osseous finding aside from sternotomy. IMPRESSION: 1. NGT tip extends just below the hiatus with the side hole in the distal esophagus and needs to be advanced into the stomach. 2. ETT, right IJ Swan-Ganz catheter, mediastinal drain and horizontal drain at the lower edge of the film the latter which is probably in the pericardium. 3. Stable mediastinal widening.  New AVR. 4. Left lower lobe opacity which could be atelectasis or consolidation. Low lung volumes. 5. Stable cardiomegaly. Electronically Signed   By: Denman Fischer M.D.   On: 02/02/2024 07:05   CT Angio Chest/Abd/Pel for Dissection W and/or  W/WO Result Date: 02/01/2024 CLINICAL DATA:  Aortic aneurysm suspected, dissection flap on TTE EXAM: CT ANGIOGRAPHY CHEST, ABDOMEN AND PELVIS TECHNIQUE: Non-contrast CT of the chest was initially obtained. Multidetector CT imaging through the chest, abdomen and pelvis was performed using the standard protocol during bolus administration of intravenous contrast. Multiplanar reconstructed images and MIPs were obtained and reviewed to evaluate the vascular anatomy. RADIATION DOSE REDUCTION: This exam was performed according to the departmental dose-optimization program which includes automated exposure control, adjustment of the mA and/or kV according to patient size and/or use of iterative reconstruction technique. CONTRAST:  OMNIPAQUE  IOHEXOL  350 MG/ML SOLN COMPARISON:  None Available. FINDINGS: CTA CHEST FINDINGS Pulmonary Embolism: No pulmonary embolism. Cardiovascular: Mild cardiomegaly. No pericardial effusion. The ascending aorta is dilated measuring 5.5 cm. The proximal arch is also dilated measuring 5.2 cm. Descending aortic dilation also present measuring 3.7 cm. Multi-vessel coronary atherosclerosis. Aorta: There is a in acute aortic dissection flap arising from the right lateral aspect of the aortic root causing moderate stenosis of the right renal artery ostium. This extends throughout the thoracic aorta and into abdominal aorta with the flap extending just below the level of the IMA ostium. Motion artifact favored in the brachiocephalic artery, which is supplied by the true lumen. Intimal flap extends into the proximal left common carotid artery. No intimal flap in the left subclavian artery, which is widely patent and supplied by the true lumen. Mediastinum/Nodes: No mediastinal mass. No mediastinal, hilar, or axillary lymphadenopathy. Lungs/Pleura: The midline trachea and bronchi are patent. No focal airspace consolidation, pleural effusion, or pneumothorax. Posterior bibasilar dependent  atelectasis. Review of the MIP images confirms the above findings. CTA ABDOMEN AND PELVIS FINDINGS VASCULAR Aorta: No significant contrast opacification of the false lumen in the upper abdomen, which supplies the celiac artery. No aneurysm. Celiac: Supplied by the false lumen without significant contrast opacification, which may be due to no flow or thrombosis. Distal reconstitution present from the SMA via the GDA and peripancreatic arcades. No aneurysm. SMA: Moderate narrowing at the ostium from protrusion of the intimal flap into the SMA ostium. Renals: On the right, there are 2 renal arteries, both supplied by the true lumen. On the left, the dominant renal artery is the most superior vessel, which demonstrates severe proximal narrowing from the intimal flap which extends into the proximal left renal artery. There is a diminutive left lower pole accessory artery, which closely apposes the intimal flap, possibly supplied by the false lumen; however, there is contrast opacification present. IMA: Patent, supplied by the true lumen. Without acute thrombus, aneurysm, or dissection. No hemodynamically significant stenosis. Inflow: Patent without acute thrombus, aneurysm, or dissection. No hemodynamically significant stenosis.  Proximal Outflow: The bilateral common femoral and visualized portions of the superficial and profunda femoral arteries are patent without acute thrombus, aneurysm, or dissection. No hemodynamically significant stenosis. Veins: No obvious venous abnormality within the limitations of this arterial phase study. Review of the MIP images confirms the above findings. NON-VASCULAR Hepatobiliary: No mass. Reflux of contrast into the hepatic veins. No radiopaque stones or wall thickening of the gallbladder.No intrahepatic or extrahepatic biliary ductal dilation.The portal veins are patent. Pancreas: Diffuse fatty atrophy of the pancreatic parenchyma. No mass or ductal dilation.No peripancreatic  inflammation or fluid collection. Spleen: Normal size. No mass. Adrenals/Urinary Tract: No adrenal masses. Mild renal cortical atrophy bilaterally. A couple of subcentimeter hypodensities are noted in the kidneys, too small to definitively characterize, but likely small cysts. 2 cm left upper pole cyst. No hydronephrosis or nephrolithiasis. The urinary bladder is distended without focal abnormality. Stomach/Bowel: The stomach is decompressed without focal abnormality. No small bowel wall thickening or inflammation. No small bowel obstruction. Normal appendix. Total colonic diverticulosis. No changes of acute diverticulitis. Lymphatic: No intraabdominal or pelvic lymphadenopathy. Reproductive: No prostatomegaly.No free pelvic fluid. Other: No pneumoperitoneum, ascites, or mesenteric inflammation. Musculoskeletal: No acute fracture or destructive lesion.Multilevel degenerative disc disease of the spine. Review of the MIP images confirms the above findings. IMPRESSION: 1. Fusiform ascending aortic aneurysm measuring 5.5 cm with superimposed acute aortic dissection arising from the right lateral aspect of the aortic root throughout the thoracic aorta, and into the upper abdomen with the intimal flap terminating at the IMA ostium. Of note, the ostium of the right coronary artery is at least moderately narrowed. It is difficult to ascertain whether the intimal flap extends into the proximal portion of the vessel. 2. The brachiocephalic and left subclavian arteries are supplied by the true lumen. The intimal flap extends into the left common carotid artery with a severely narrowed true lumen. Changes in the brachiocephalic artery are favored to represent motion artifact rather than flap propagation. 3. No significant contrast opacification of the false lumen within the upper abdominal aorta. The celiac artery is not opacified, supplied by the false lumen, possibly due to lack of contrast opacification versus thrombosis.  Distal reconstitution is present via the SMA, GDA, and peripancreatic arcades. 4. 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 5. Moderate narrowing of the SMA ostium from propagation of the intimal flap into the ostium. 6. Reflux of contrast into the hepatic veins, suggest underlying cardiac dysfunction. Critical Value/emergent results were called by telephone at the time of interpretation on 02/01/2024 at 6:58 pm to provider Gerold Kos, who advised that the patient was already undergoing dissection surgery with the cardiothoracic surgeon. Electronically Signed   By: Rance Burrows M.D.   On: 02/01/2024 19:04   ECHOCARDIOGRAM COMPLETE Result Date: 02/01/2024    ECHOCARDIOGRAM REPORT   Patient Name:   WILKIN ELL Sabado Date of Exam: 02/01/2024 Medical Rec #:  [id number]    Height:       73.0 in Accession #:    [id number]   Weight:       265.0 lb Date of Birth:  1955-11-10    BSA:          2.425 m Patient Age:    67 years     BP:           127/37 mmHg Patient Gender: M            HR:  93 bpm. Exam Location:  Inpatient Procedure: 2D Echo, Intracardiac Opacification Agent, Cardiac Doppler and Color            Doppler (Both Spectral and Color Flow Doppler were utilized during            procedure). 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                 CAD; Signs/Symptoms:Chest Pain.  Sonographer:    Raynelle Callow RDCS Referring Phys: [id number] Hubert Madden [place]  Sonographer Comments: Technically difficult study due to poor echo windows and patient is obese. Image acquisition challenging due to patient body habitus. Patient supine post PCI. Could not turn. IMPRESSIONS  1. Left ventricular ejection fraction, by estimation, is 50 to 55%. The left ventricle has low  normal function. The left ventricle has no regional wall motion abnormalities.  2. Right ventricular systolic function is moderately reduced. The right ventricular size is moderately enlarged.  3. The mitral valve is normal in structure. No evidence of mitral valve regurgitation. No evidence of mitral stenosis.  4. The aortic valve is normal in structure. Aortic valve regurgitation is mild. No aortic stenosis is present.  5. Can not exclude dilated aorta (potential artifact inhibiting definitive diagonosis). There is also a linear mid aorta demarcation which seems to move with opening and closure of aortic valve. Would recommend further imaging to exclude aortic false lumen. Discussed with shock team. . Aneurysm of the ascending aorta, measuring 52 mm. There is severe dilatation of the ascending aorta, measuring 52 mm.  6. The inferior vena cava is normal in size with greater than 50% respiratory variability, suggesting right atrial pressure of 3 mmHg. FINDINGS  Left Ventricle: Left ventricular ejection fraction, by estimation, is 50 to 55%. The left ventricle has low normal function. The left ventricle has no regional wall motion abnormalities. Definity  contrast agent was given IV to delineate the left ventricular endocardial borders. The left ventricular internal cavity size was normal in size. There is no left ventricular hypertrophy.  LV Wall Scoring: The mid inferoseptal segment and basal inferoseptal segment are akinetic. Right Ventricle: The right ventricular size is moderately enlarged. No increase in right ventricular wall thickness. Right ventricular systolic function is moderately reduced. Left Atrium: Left atrial size was normal in size. Right Atrium: Right atrial size was normal in size. Pericardium: There is no evidence of pericardial effusion. Mitral Valve: The mitral valve is normal in structure. No evidence of mitral valve regurgitation. No evidence of mitral valve stenosis. Tricuspid Valve: The  tricuspid valve is normal in structure. Tricuspid valve regurgitation is not demonstrated. No evidence of tricuspid stenosis. Aortic Valve: The aortic valve is normal in structure. Aortic valve regurgitation is mild. No aortic stenosis is present. Pulmonic Valve: The pulmonic valve was normal in structure. Pulmonic valve regurgitation is not visualized. No evidence of pulmonic stenosis. Aorta: Can not exclude dilated aorta (potential artifact inhibiting definitive diagonosis). There is also a linear mid aorta demarcation which seems to move with opening and closure of aortic valve. Would recommend further imaging to exclude aortic false  lumen. Discussed with shock team. The aortic root is normal in size and structure. There is severe dilatation of the ascending  aorta, measuring 52 mm. There is an aneurysm involving the ascending aorta measuring 52 mm. Venous: The inferior vena cava is normal in size with greater than 50% respiratory variability, suggesting right atrial pressure of 3 mmHg. IAS/Shunts: No atrial level shunt detected by color flow Doppler.  LEFT VENTRICLE PLAX 2D LVIDd:         6.30 cm      Diastology LVIDs:         4.00 cm      LV e' medial:    7.30 cm/s LV PW:         1.10 cm      LV E/e' medial:  13.6 LV IVS:        1.20 cm      LV e' lateral:   7.78 cm/s LVOT diam:     2.50 cm      LV E/e' lateral: 12.8 LV SV:         113 LV SV Index:   47 LVOT Area:     4.91 cm  LV Volumes (MOD) LV vol d, MOD A2C: 198.0 ml LV vol d, MOD A4C: 188.0 ml LV vol s, MOD A2C: 76.4 ml LV vol s, MOD A4C: 84.4 ml LV SV MOD A2C:     121.6 ml LV SV MOD A4C:     188.0 ml LV SV MOD BP:      109.9 ml RIGHT VENTRICLE             IVC RV S prime:     22.40 cm/s  IVC diam: 2.50 cm TAPSE (M-mode): 2.2 cm LEFT ATRIUM             Index        RIGHT ATRIUM           Index LA diam:        2.80 cm 1.15 cm/m   RA Area:     22.90 cm LA Vol (A2C):   65.6 ml 27.05 ml/m  RA Volume:   68.50 ml  28.25 ml/m LA Vol (A4C):   43.5 ml 17.94  ml/m LA Biplane Vol: 54.9 ml 22.64 ml/m  AORTIC VALVE LVOT Vmax:   96.90 cm/s LVOT Vmean:  66.200 cm/s LVOT VTI:    0.230 m  AORTA Ao Root diam: 4.70 cm Ao Asc diam:  5.40 cm MITRAL VALVE MV Area (PHT): 3.26 cm    SHUNTS MV Decel Time: 233 msec    Systemic VTI:  0.23 m MV E velocity: 99.60 cm/s  Systemic Diam: 2.50 cm MV A velocity: 41.60 cm/s MV E/A ratio:  2.39 Dorothye Gathers MD Electronically signed by Dorothye Gathers MD Signature Date/Time: 02/01/2024/3:09:36 PM    Final    US  EKG SITE RITE Result Date: 02/01/2024 If Site Rite image not attached, placement could not be confirmed due to current cardiac rhythm.  CARDIAC CATHETERIZATION Result Date: 02/01/2024   Mid LAD lesion is 25% stenosed.   Mid Cx to Dist Cx lesion is 40% stenosed.   Prox RCA to Mid RCA lesion is 70% stenosed.   Dist RCA lesion is 99% stenosed.   A drug-eluting stent was successfully placed using a SYNERGY XD 2.50X38.   A stent was successfully placed.   A drug-eluting stent was successfully placed using a STENT SYS SYNERGY XD 3.0X38.   A drug-eluting stent was successfully placed using a STENT SYNERGY XD 3.0X32.   Post intervention, there is a 0% residual stenosis.   Post intervention, there is  a 0% residual stenosis.   Recommend uninterrupted dual antiplatelet therapy with Aspirin  81mg  daily and Prasugrel  10mg  daily for a minimum of 12 months (ACS-Class I recommendation). 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 Risk factor modification. Check Echo. May be a candidate for low risk STEMI DC tomorrow if no complications.    Microbiology Recent Results (from the past 240 hours)  Culture, Respiratory w Gram Stain     Status: None   Collection Time: 02/18/24 11:57 AM   Specimen: Bronchoalveolar Lavage; Respiratory  Result Value Ref Range Status   Specimen Description BRONCHIAL ALVEOLAR LAVAGE  Final   Special  Requests NONE  Final   Gram Stain   Final    ABUNDANT SQUAMOUS EPITHELIAL CELLS PRESENT NO WBC SEEN NO ORGANISMS SEEN Performed at [facility], [address], [place], [place] [address]    Culture RARE CANDIDA ALBICANS  Final   Report Status 02/20/2024 FINAL  Final    Lab Basic Metabolic Panel: Recent Labs  Lab 02/24/24 0407 02/24/24 1119 02/25/24 0430 02/25/24 1619 02/26/24 0427 02/26/24 1606 02/27/24 0413 02/27/24 1543  NA 139   < > 134* 133* 133* 130* 131* 131*  K 3.8   < > 4.2 4.6 4.0 5.0 4.9 5.2*  CL 105   < > 100 100 98 96* 95* 96*  CO2 24   < > 25 26 23 24 23 22   GLUCOSE 212*   < > 184* 221* 199* 347* 202* 306*  BUN 64*   < > 42* 45* 43* 50* 50* 49*  CREATININE 1.00   < > 0.94 0.93 0.93 1.19 1.47* 1.58*  CALCIUM  8.2*   < > 8.5* 8.6* 8.8* 8.7* 9.5 9.0  MG 2.5*  --  2.6*  --  2.7*  --  3.1*  --   PHOS 3.1   < > 2.8 2.5 3.0 2.6 3.4 3.5   < > = values in this interval not displayed.   Liver Function Tests: Recent Labs  Lab 02/25/24 1619 02/26/24 0427 02/26/24 1606 02/27/24 0413 02/27/24 1543  ALBUMIN  2.5* 2.6* 2.7* 3.2* 3.1*   No results for input(s): "LIPASE", "AMYLASE" in the last 168 hours. Recent Labs  Lab 02/24/24 1341  AMMONIA 26   CBC: Recent Labs  Lab 02/25/24 0430 02/26/24 0427 02/27/24 0413 02/27/24 1543 02/27/24 1718  WBC 13.4* 13.2* 16.2* 17.6* 20.3*  HGB 8.5* 8.9* 10.6* 10.8* 10.7*  HCT 28.8* 30.7* 37.2* 37.1* 36.7*  MCV 90.9 91.9 91.6 91.4 92.2  PLT 489* 478* 537* 502* 518*   Cardiac Enzymes: No results for input(s): "CKTOTAL", "CKMB", "CKMBINDEX", "TROPONINI" in the last 168 hours. Sepsis Labs: Recent Labs  Lab 02/26/24 0427 02/27/24 0413 02/27/24 1526 02/27/24 1543 02/27/24 1718  WBC 13.2* 16.2*  --  17.6* 20.3*  LATICACIDVEN  --   --  1.8  --   --     Procedures/Operations  ***   Joesph Mussel 02/23/2024, 7:56 AM

## 2024-03-19 NOTE — Progress Notes (Signed)
 No respirations and no heart beat confirmed by this RN and Rosalina Colt RN at [id number] on Feb 28, 2024.

## 2024-03-19 NOTE — Progress Notes (Signed)
 Patient removed from the ventilator with tracheostomy cuff deflated per MD order.

## 2024-03-19 DEATH — deceased
